# Patient Record
Sex: Male | Born: 1960
Health system: Southern US, Community
[De-identification: ages and names within clinical notes are randomized; demographics above are authoritative.]

## PROBLEM LIST (undated history)

## (undated) DIAGNOSIS — I10 Essential (primary) hypertension: Secondary | ICD-10-CM

## (undated) DIAGNOSIS — K219 Gastro-esophageal reflux disease without esophagitis: Secondary | ICD-10-CM

## (undated) DIAGNOSIS — R0600 Dyspnea, unspecified: Secondary | ICD-10-CM

## (undated) DIAGNOSIS — E7800 Pure hypercholesterolemia, unspecified: Secondary | ICD-10-CM

## (undated) DIAGNOSIS — I214 Non-ST elevation (NSTEMI) myocardial infarction: Secondary | ICD-10-CM

## (undated) DIAGNOSIS — R0902 Hypoxemia: Secondary | ICD-10-CM

## (undated) DIAGNOSIS — I2510 Atherosclerotic heart disease of native coronary artery without angina pectoris: Secondary | ICD-10-CM

## (undated) DIAGNOSIS — I209 Angina pectoris, unspecified: Secondary | ICD-10-CM

## (undated) DIAGNOSIS — K5790 Diverticulosis of intestine, part unspecified, without perforation or abscess without bleeding: Secondary | ICD-10-CM

## (undated) DIAGNOSIS — M1990 Unspecified osteoarthritis, unspecified site: Secondary | ICD-10-CM

## (undated) HISTORY — DX: Diverticulosis of intestine, part unspecified, without perforation or abscess without bleeding: K57.90

## (undated) HISTORY — DX: Atherosclerotic heart disease of native coronary artery without angina pectoris: I25.10

## (undated) HISTORY — DX: Hypoxemia: R09.02

---

## 2002-08-20 ENCOUNTER — Emergency Department (HOSPITAL_COMMUNITY): Admission: EM | Admit: 2002-08-20 | Discharge: 2002-08-20 | Payer: Self-pay | Admitting: *Deleted

## 2002-08-20 ENCOUNTER — Encounter: Payer: Self-pay | Admitting: *Deleted

## 2002-09-09 ENCOUNTER — Encounter: Payer: Self-pay | Admitting: Emergency Medicine

## 2002-09-09 ENCOUNTER — Emergency Department (HOSPITAL_COMMUNITY): Admission: EM | Admit: 2002-09-09 | Discharge: 2002-09-09 | Payer: Self-pay | Admitting: Emergency Medicine

## 2003-01-18 ENCOUNTER — Emergency Department (HOSPITAL_COMMUNITY): Admission: EM | Admit: 2003-01-18 | Discharge: 2003-01-18 | Payer: Self-pay | Admitting: *Deleted

## 2003-03-17 ENCOUNTER — Encounter (HOSPITAL_COMMUNITY): Admission: RE | Admit: 2003-03-17 | Discharge: 2003-04-16 | Payer: Self-pay | Admitting: Cardiology

## 2003-03-17 ENCOUNTER — Encounter: Payer: Self-pay | Admitting: Cardiology

## 2003-04-04 ENCOUNTER — Ambulatory Visit (HOSPITAL_COMMUNITY): Admission: RE | Admit: 2003-04-04 | Discharge: 2003-04-04 | Payer: Self-pay | Admitting: *Deleted

## 2003-04-09 ENCOUNTER — Ambulatory Visit (HOSPITAL_COMMUNITY): Admission: RE | Admit: 2003-04-09 | Discharge: 2003-04-09 | Payer: Self-pay | Admitting: *Deleted

## 2004-08-04 ENCOUNTER — Emergency Department (HOSPITAL_COMMUNITY): Admission: EM | Admit: 2004-08-04 | Discharge: 2004-08-04 | Payer: Self-pay | Admitting: Emergency Medicine

## 2006-03-01 ENCOUNTER — Emergency Department: Payer: Self-pay | Admitting: Emergency Medicine

## 2009-06-08 ENCOUNTER — Ambulatory Visit: Payer: Self-pay | Admitting: Family Medicine

## 2009-06-08 DIAGNOSIS — S335XXA Sprain of ligaments of lumbar spine, initial encounter: Secondary | ICD-10-CM | POA: Insufficient documentation

## 2009-08-13 ENCOUNTER — Encounter: Payer: Self-pay | Admitting: Family Medicine

## 2010-11-02 ENCOUNTER — Emergency Department (HOSPITAL_COMMUNITY)
Admission: EM | Admit: 2010-11-02 | Discharge: 2010-11-02 | Payer: Self-pay | Source: Home / Self Care | Admitting: Emergency Medicine

## 2011-02-25 NOTE — Op Note (Signed)
Anthony Hensley, Anthony Hensley                            ACCOUNT NO.:  [id number]   MEDICAL RECORD NO.:  [id number]                   PATIENT TYPE:  OIB   LOCATION:  2855                                 FACILITY:  [facility]   PHYSICIAN:  Vida Roller, M.D.                DATE OF BIRTH:  09/14/1961   DATE OF PROCEDURE:  04/09/2003  DATE OF DISCHARGE:                                 OPERATIVE REPORT   PRIMARY CARE PHYSICIAN:  Anthony Hensley, M.D.   PROCEDURES PERFORMED:  1. Left heart catheterization.  2. Selective coronary angiography.  3. Left ventriculography all via the right radial approach.   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  He  presented to see me with atypical chest discomfort, had a stress test which  was electrically positive and was referred for elective heart  catheterization.  I discussed with him the approach via the groin and the  right wrist and he chose the radial approach.   DESCRIPTION OF PROCEDURE:  After obtaining informed consent, the patient was  brought to the cardiac catheterization laboratory in the fasting state.  There, he was prepped and draped in the usual sterile manner and conscious  sedation was obtained using graduated doses of Versed and Fentanyl.  The  right wrist was anesthetized using 1% lidocaine without epinephrine and the  right radial artery was cannulated using the modified Seldinger technique  with a 6 French 10 cm catheter.  The left heart catheterization was  performed using a 6 French Judkins left #4 which subselectively seated the  circumflex coronary artery.  A 6 French Amplatz left #1 which successfully  seated the left main coronary artery and a 6 French Judkins right #4 which  successfully seated the right coronary artery as well as a 6 French angled  pigtail catheter used for the ventriculography.  Ventriculography was  performed in the 30 degree RAO view with a power injector which injected at  a rate of 10 mL a second for a total of 39 mL at half second rate of rise,  600 PSI.  At the conclusion of the procedure, the catheters were removed.  The sheath was removed.  A Radstat device was placed.  Adequate hemostasis  was obtained.  There was pulse oximetry determinations in both thumb and  third finger which were 93% indicating adequate oxygenation throughout the  palmar arch.   RESULTS:  The left ventricular pressure was 116/5 with an end diastolic  pressure of 7 mmHg.   The aortic pressure was 107/75 with a mean arterial pressure of 95 mmHg.   Selective coronary angiography:  The left main coronary artery is a short  artery which is angiographically normal.   The left anterior descending coronary artery is a moderate caliber vessel  which has a 25% stenosis in its mid portion between the first and second  diagonal.  The first and second diagonal are small arteries.  The remainder  of the LAD is angiographically normal.   The circumflex coronary artery is a moderate caliber vessel which is  angiographically normal and gives off one large obtuse marginal and a small  obtuse marginal.   The right coronary artery is a moderate caliber dominant vessel giving off a  moderate caliber left anterior descending coronary artery.  It is  angiographically normal.   Left ventriculogram reveals an ejection fraction estimated at 60% with no  wall motion abnormalities and no mitral regurgitation.   ASSESSMENT:  This is a man with nonobstructive coronary disease in his left  anterior descending coronary artery.   RECOMMENDATIONS:  We recommend aggressive medical therapy and smoking  cessation.                                               Vida Roller, M.D.    JH/MEDQ  D:  04/09/2003  T:  04/09/2003  Job:  [id number]   cc:   Anthony Hensley, M.D.  [address]  [place], [place] [address]  Fax:  [phone]

## 2011-02-25 NOTE — Procedures (Signed)
   NAME:  Birdie, Beveridge NO.:  [id number]   MEDICAL RECORD NO.:  [id number]                   PATIENT TYPE:  PREC   LOCATION:                                       FACILITY:   PHYSICIAN:  Vida Roller, M.D.                DATE OF BIRTH:  Jun 15, 1961   DATE OF PROCEDURE:  03/17/2003  DATE OF DISCHARGE:                                    STRESS TEST   INDICATION:  Mr. Malter is a 50 year old male with no known coronary artery  disease, who presented with chest discomfort with elements both typical and  atypical for coronary artery disease.  His cardiac risk factors include  hypertension, hyperlipidemia, tobacco abuse.   BASELINE DATA:  EKG shows sinus rhythm at 67 beats per minute with  nonspecific ST abnormalities.  Blood pressure is 110/72.  The patient  exercised for a total of 10 minutes and 42 seconds to 12.9 METS.  The  patient complained of left-sided chest pressure with radiation to the left  arm which was 8/10 at its worst and resolved to complete recovery.  EKG  showed no ischemic changes and no arrhythmias.  Maximum heart rate achieved  was 155 beats per minute which is 86% of predicted maximum, and maximal  blood pressure was 172/90.   Final images and results are pending MD review.     Amy Mercy Riding, P.A. [facility]                     Vida Roller, M.D.    AB/MEDQ  D:  03/17/2003  T:  03/17/2003  Job:  [id number]

## 2015-02-18 ENCOUNTER — Encounter (HOSPITAL_COMMUNITY): Payer: Self-pay | Admitting: *Deleted

## 2015-02-18 ENCOUNTER — Emergency Department (HOSPITAL_COMMUNITY)
Admission: EM | Admit: 2015-02-18 | Discharge: 2015-02-18 | Disposition: A | Discharge status: Home / Self Care | Payer: No Typology Code available for payment source | Attending: Emergency Medicine | Admitting: Emergency Medicine

## 2015-02-18 ENCOUNTER — Emergency Department (HOSPITAL_COMMUNITY): Payer: No Typology Code available for payment source

## 2015-02-18 DIAGNOSIS — I10 Essential (primary) hypertension: Secondary | ICD-10-CM | POA: Insufficient documentation

## 2015-02-18 DIAGNOSIS — R42 Dizziness and giddiness: Secondary | ICD-10-CM | POA: Diagnosis present

## 2015-02-18 DIAGNOSIS — Z720 Tobacco use: Secondary | ICD-10-CM | POA: Diagnosis not present

## 2015-02-18 HISTORY — DX: Essential (primary) hypertension: I10

## 2015-02-18 LAB — COMPREHENSIVE METABOLIC PANEL
ALK PHOS: 87 U/L (ref 38–126)
ALT: 38 U/L (ref 17–63)
AST: 26 U/L (ref 15–41)
Albumin: 3.9 g/dL (ref 3.5–5.0)
Anion gap: 11 (ref 5–15)
BILIRUBIN TOTAL: 0.5 mg/dL (ref 0.3–1.2)
BUN: 22 mg/dL — ABNORMAL HIGH (ref 6–20)
CHLORIDE: 102 mmol/L (ref 101–111)
CO2: 25 mmol/L (ref 22–32)
Calcium: 9.6 mg/dL (ref 8.9–10.3)
Creatinine, Ser: 0.97 mg/dL (ref 0.61–1.24)
GFR calc Af Amer: >60 mL/min (ref >60)
GFR calc non Af Amer: >60 mL/min (ref >60)
Glucose, Bld: 103 mg/dL — ABNORMAL HIGH (ref 70–99)
POTASSIUM: 3.9 mmol/L (ref 3.5–5.1)
Sodium: 138 mmol/L (ref 135–145)
Total Protein: 7.7 g/dL (ref 6.5–8.1)

## 2015-02-18 LAB — CBC WITH DIFFERENTIAL/PLATELET
Basophils Absolute: 0.1 10*3/uL (ref 0.0–0.1)
Basophils Relative: 1 % (ref 0–1)
Eosinophils Absolute: 0.1 10*3/uL (ref 0.0–0.7)
Eosinophils Relative: 2 % (ref 0–5)
HEMATOCRIT: 48.1 % (ref 39.0–52.0)
HEMOGLOBIN: 16.4 g/dL (ref 13.0–17.0)
LYMPHS PCT: 30 % (ref 12–46)
Lymphs Abs: 1.5 10*3/uL (ref 0.7–4.0)
MCH: 30.8 pg (ref 26.0–34.0)
MCHC: 34.1 g/dL (ref 30.0–36.0)
MCV: 90.4 fL (ref 78.0–100.0)
MONO ABS: 0.4 10*3/uL (ref 0.1–1.0)
MONOS PCT: 8 % (ref 3–12)
NEUTROS ABS: 2.9 10*3/uL (ref 1.7–7.7)
Neutrophils Relative %: 59 % (ref 43–77)
Platelets: 190 10*3/uL (ref 150–400)
RBC: 5.32 MIL/uL (ref 4.22–5.81)
RDW: 12.7 % (ref 11.5–15.5)
WBC: 4.9 10*3/uL (ref 4.0–10.5)

## 2015-02-18 NOTE — ED Provider Notes (Signed)
CSN: [id number]     Arrival date & time 02/18/15  1758 History   First MD Initiated Contact with Patient 02/18/15 2103     Chief Complaint  Patient presents with  . Dizziness     (Consider location/radiation/quality/duration/timing/severity/associated sxs/prior Treatment) HPI Comments: 54 year old male, history of high blood pressure and tobacco use who presents with approximately one month of intermittent vertigo, he states that sometimes this comes on when he stands up, sometimes when he is sitting, it lasted between 10 and 20 minutes and then self resolves. He does get improvement with meclizine, he was recently diagnosed with an ear infection and treated with antibiotics, he has occasional ringing in his right ear. The symptoms are intermittent, he does not have them at this time.  Patient is a 54 y.o. male presenting with dizziness. The history is provided by the patient.  Dizziness   Past Medical History  Diagnosis Date  . Hypertension    History reviewed. No pertinent past surgical history. No family history on file. History  Substance Use Topics  . Smoking status: Current Every Day Smoker  . Smokeless tobacco: Not on file  . Alcohol Use: No    Review of Systems  Neurological: Positive for dizziness.  All other systems reviewed and are negative.     Allergies  Review of patient's allergies indicates no known allergies.  Home Medications   Prior to Admission medications   Not on File   BP 162/78 mmHg  Pulse 64  Temp(Src) 97.4 F (36.3 C) (Oral)  Resp 23  Ht 5\' 6"  (1.676 m)  Wt 154 lb (69.854 kg)  BMI 24.87 kg/m2  SpO2 95% Physical Exam  Constitutional: He appears well-developed and well-nourished. No distress.  HENT:  Head: Normocephalic and atraumatic.  Mouth/Throat: Oropharynx is clear and moist. No oropharyngeal exudate.  Normal appearing tympanic membranes bilaterally, no erythema, no effusion, no bulging, landmarks visualized easily. Oropharynx is  clear and moist, nasal passages are clear, no sinus tenderness, no trismus  Eyes: Conjunctivae and EOM are normal. Pupils are equal, round, and reactive to light. Right eye exhibits no discharge. Left eye exhibits no discharge. No scleral icterus.  Neck: Normal range of motion. Neck supple. No JVD present. No thyromegaly present.  No lymphadenopathy or torticollis  Cardiovascular: Normal rate, regular rhythm, normal heart sounds and intact distal pulses.  Exam reveals no gallop and no friction rub.   No murmur heard. Pulmonary/Chest: Effort normal and breath sounds normal. No respiratory distress. He has no wheezes. He has no rales.  Abdominal: Soft. Bowel sounds are normal. He exhibits no distension and no mass. There is no tenderness.  Musculoskeletal: Normal range of motion. He exhibits no edema or tenderness.  Lymphadenopathy:    He has no cervical adenopathy.  Neurological: He is alert. Coordination normal.  No nystagmus, strength is 5 out of 5 in all 4 extremities, speech is normal, sensation is normal in all 4 extremities, cranial nerves III through XII are intact, memory is intact, no inducible nystagmus  Skin: Skin is warm and dry. No rash noted. No erythema.  Psychiatric: He has a normal mood and affect. His behavior is normal.  Nursing note and vitals reviewed.   ED Course  Procedures (including critical care time) Labs Review Labs Reviewed  COMPREHENSIVE METABOLIC PANEL - Abnormal; Notable for the following:    Glucose, Bld 103 (*)    BUN 22 (*)    All other components within normal limits  CBC WITH DIFFERENTIAL/PLATELET  Imaging Review Dg Chest 2 View  02/18/2015   CLINICAL DATA:  Dizziness for 1 month. Sweating after dizzy episodes today. History of hypertension.  EXAM: CHEST  2 VIEW  COMPARISON:  Report from 04/04/2003  FINDINGS: Airway thickening is present, suggesting bronchitis or reactive airways disease. Cardiac and mediastinal margins appear normal. No pleural  effusion or airspace opacity noted.  Deformity of the left inferior scapular neck, query remote fracture versus less likely exostosis.  IMPRESSION: 1. Airway thickening is present, suggesting bronchitis or reactive airways disease. 2. Deformity of the left scapular neck probably from an old fracture, less likely due to an exostosis.   Electronically Signed   By: Van Clines M.D.   On: 02/18/2015 20:02     EKG Interpretation   Date/Time:  Wednesday Feb 18 2015 18:29:08 EDT Ventricular Rate:  84 PR Interval:  154 QRS Duration: 104 QT Interval:  380 QTC Calculation: 449 R Axis:   80 Text Interpretation:  Normal sinus rhythm Incomplete right bundle branch  block Borderline ECG No old tracing to compare Confirmed by Mahiya Kercheval  MD,  [place] ([id number]) on 02/18/2015 9:22:28 PM      MDM   Final diagnoses:  Vertigo    The patient is likely having some kind of labyrinthitis, his vital signs show mild hypertension, he is likely having this as a effect of the recent ear infection, at this time he appears benign, he is not having continual symptoms, this does not sound like a central source, he is having improvement with meclizine and is not having any symptoms at this time, he appears stable for discharge. He will be kept out of work this week, follow-up with his doctor on Monday, advanced imaging is not indicated at this time.    Noemi Chapel, MD 02/18/15 2154

## 2015-02-18 NOTE — Discharge Instructions (Signed)
Take the meclizine as needed for dizziness, see your doctor on Monday.  Please call your doctor for a followup appointment within 24-48 hours. When you talk to your doctor please let them know that you were seen in the emergency department and have them acquire all of your records so that they can discuss the findings with you and formulate a treatment plan to fully care for your new and ongoing problems.

## 2015-02-18 NOTE — ED Notes (Signed)
The pot is c/o dizziness since Friday.  He saw his doctor and was diagnosed with inner ear rt ear.,  noi better.

## 2015-06-01 ENCOUNTER — Encounter (HOSPITAL_COMMUNITY): Payer: Self-pay | Admitting: Vascular Surgery

## 2015-06-01 ENCOUNTER — Emergency Department (HOSPITAL_COMMUNITY)
Admission: EM | Admit: 2015-06-01 | Discharge: 2015-06-02 | Disposition: A | Discharge status: Home / Self Care | Payer: No Typology Code available for payment source | Attending: Emergency Medicine | Admitting: Emergency Medicine

## 2015-06-01 DIAGNOSIS — I10 Essential (primary) hypertension: Secondary | ICD-10-CM | POA: Insufficient documentation

## 2015-06-01 DIAGNOSIS — Z720 Tobacco use: Secondary | ICD-10-CM | POA: Diagnosis not present

## 2015-06-01 DIAGNOSIS — R42 Dizziness and giddiness: Secondary | ICD-10-CM | POA: Diagnosis present

## 2015-06-01 LAB — BASIC METABOLIC PANEL
Anion gap: 7 (ref 5–15)
BUN: 14 mg/dL (ref 6–20)
CO2: 28 mmol/L (ref 22–32)
Calcium: 9.6 mg/dL (ref 8.9–10.3)
Chloride: 104 mmol/L (ref 101–111)
Creatinine, Ser: 1.04 mg/dL (ref 0.61–1.24)
GFR calc Af Amer: >60 mL/min (ref >60)
Glucose, Bld: 86 mg/dL (ref 65–99)
Potassium: 4.5 mmol/L (ref 3.5–5.1)
SODIUM: 139 mmol/L (ref 135–145)

## 2015-06-01 LAB — I-STAT TROPONIN, ED: Troponin i, poc: 0 ng/mL (ref 0.00–0.08)

## 2015-06-01 LAB — URINALYSIS, ROUTINE W REFLEX MICROSCOPIC
Bilirubin Urine: NEGATIVE
Glucose, UA: NEGATIVE mg/dL
HGB URINE DIPSTICK: NEGATIVE
KETONES UR: NEGATIVE mg/dL
Leukocytes, UA: NEGATIVE
NITRITE: NEGATIVE
Protein, ur: NEGATIVE mg/dL
Specific Gravity, Urine: 1.016 (ref 1.005–1.030)
UROBILINOGEN UA: 0.2 mg/dL (ref 0.0–1.0)
pH: 5.5 (ref 5.0–8.0)

## 2015-06-01 LAB — CBC
HCT: 46.6 % (ref 39.0–52.0)
Hemoglobin: 15.9 g/dL (ref 13.0–17.0)
MCH: 31.2 pg (ref 26.0–34.0)
MCHC: 34.1 g/dL (ref 30.0–36.0)
MCV: 91.6 fL (ref 78.0–100.0)
PLATELETS: 193 10*3/uL (ref 150–400)
RBC: 5.09 MIL/uL (ref 4.22–5.81)
RDW: 12.6 % (ref 11.5–15.5)
WBC: 6.3 10*3/uL (ref 4.0–10.5)

## 2015-06-01 MED ORDER — MECLIZINE HCL 25 MG PO TABS
25.0000 mg | ORAL_TABLET | Freq: Once | ORAL | Status: AC
  Administered 2015-06-01: 25 mg via ORAL
  Filled 2015-06-01: qty 1

## 2015-06-01 NOTE — ED Notes (Signed)
Patient reports experiencing intermittent dizziness since Friday. Denies falls. Denies nausea/vomiting.

## 2015-06-01 NOTE — ED Provider Notes (Signed)
CSN: [id number]     Arrival date & time 06/01/15  1933 History   First MD Initiated Contact with Patient 06/01/15 2238     Chief Complaint  Patient presents with  . Dizziness     (Consider location/radiation/quality/duration/timing/severity/associated sxs/prior Treatment) HPI Comments: Patient presents today with a chief complaint of dizziness.  He states that the dizziness has been intermittent over the past 4 days.  He states that the dizziness occurs when changing position and lasts for a few minutes and then resolves.  He denies any dizziness when he is lying still.  He states that the dizziness is similar to dizziness that he has had in the past.  He states that he has taken Meclizine, which helps some.  He denies syncope, chest pain, SOB, vision changes, nausea, vomiting, headache, focal weakness, numbness, tingling, fever, or chills.  Patient is a 54 y.o. male presenting with dizziness. The history is provided by the patient.  Dizziness   Past Medical History  Diagnosis Date  . Hypertension    History reviewed. No pertinent past surgical history. No family history on file. Social History  Substance Use Topics  . Smoking status: Current Every Day Smoker -- 1.50 packs/day    Types: Cigarettes  . Smokeless tobacco: Never Used  . Alcohol Use: No    Review of Systems  Neurological: Positive for dizziness.  All other systems reviewed and are negative.     Allergies  Review of patient's allergies indicates no known allergies.  Home Medications   Prior to Admission medications   Not on File   BP 163/99 mmHg  Pulse 65  Temp(Src) 97.5 F (36.4 C) (Oral)  Resp 20  SpO2 98% Physical Exam  Constitutional: He appears well-developed and well-nourished.  HENT:  Head: Normocephalic and atraumatic.  Mouth/Throat: Oropharynx is clear and moist.  Eyes: Pupils are equal, round, and reactive to light. Right eye exhibits nystagmus. Left eye exhibits nystagmus.  Bilateral  horizontal nystagmus  Neck: Normal range of motion. Neck supple.  Cardiovascular: Normal rate, regular rhythm and normal heart sounds.   Pulmonary/Chest: Effort normal and breath sounds normal.  Musculoskeletal: Normal range of motion.  Neurological: He is alert. He has normal strength. No cranial nerve deficit or sensory deficit. Gait normal.  Normal gait, no ataxia  Skin: Skin is warm and dry.  Psychiatric: He has a normal mood and affect.  Nursing note and vitals reviewed.   ED Course  Procedures (including critical care time) Labs Review Labs Reviewed  BASIC METABOLIC PANEL  CBC  URINALYSIS, ROUTINE W REFLEX MICROSCOPIC (NOT AT [facility])  I-STAT TROPOININ, ED    Imaging Review No results found. I have personally reviewed and evaluated these images and lab results as part of my medical decision-making.   EKG Interpretation None      MDM   Final diagnoses:  None   Patient presents today with vertigo.  He has a history of Vertigo in the past.  Symptoms improved with Meclizine.  Vertigo is intermittent and also lasts a few minutes with positional changes.  Normal neurological exam.  Normal gait, no ataxia.  Feel that the signs and symptoms are consistent with peripheral vertigo.  Patient stable for discharge.  Return precautions given.      Hyman Bible, PA-C 06/03/15 2103  Fredia Sorrow, MD 06/05/15 [phone]

## 2015-06-02 MED ORDER — MECLIZINE HCL 50 MG PO TABS: 50.0000 mg | ORAL_TABLET | Freq: Three times a day (TID) | ORAL | Status: DC | PRN

## 2015-06-02 NOTE — ED Notes (Signed)
Discharge instructions/prescription discussed with patient/spouse. Understanding verbalized. Denies dizziness or pain at this time. Patient transported via wheelchair at time of discharge. No distress noted at discharge.

## 2015-07-11 ENCOUNTER — Encounter (HOSPITAL_COMMUNITY): Payer: Self-pay | Admitting: *Deleted

## 2015-07-11 ENCOUNTER — Inpatient Hospital Stay (HOSPITAL_COMMUNITY)
Admission: EM | Admit: 2015-07-11 | Discharge: 2015-07-14 | DRG: 247 | Disposition: A | Discharge status: Home / Self Care | Payer: PRIVATE HEALTH INSURANCE | Attending: Cardiovascular Disease | Admitting: Cardiovascular Disease

## 2015-07-11 ENCOUNTER — Emergency Department (HOSPITAL_COMMUNITY): Payer: PRIVATE HEALTH INSURANCE

## 2015-07-11 DIAGNOSIS — I10 Essential (primary) hypertension: Secondary | ICD-10-CM | POA: Diagnosis present

## 2015-07-11 DIAGNOSIS — I214 Non-ST elevation (NSTEMI) myocardial infarction: Secondary | ICD-10-CM | POA: Diagnosis not present

## 2015-07-11 DIAGNOSIS — R0789 Other chest pain: Secondary | ICD-10-CM

## 2015-07-11 DIAGNOSIS — R079 Chest pain, unspecified: Secondary | ICD-10-CM | POA: Diagnosis present

## 2015-07-11 DIAGNOSIS — I252 Old myocardial infarction: Secondary | ICD-10-CM | POA: Insufficient documentation

## 2015-07-11 DIAGNOSIS — Z6825 Body mass index (BMI) 25.0-25.9, adult: Secondary | ICD-10-CM

## 2015-07-11 DIAGNOSIS — Z9114 Patient's other noncompliance with medication regimen: Secondary | ICD-10-CM

## 2015-07-11 DIAGNOSIS — Z955 Presence of coronary angioplasty implant and graft: Secondary | ICD-10-CM

## 2015-07-11 DIAGNOSIS — E785 Hyperlipidemia, unspecified: Secondary | ICD-10-CM | POA: Diagnosis present

## 2015-07-11 DIAGNOSIS — F17210 Nicotine dependence, cigarettes, uncomplicated: Secondary | ICD-10-CM | POA: Diagnosis present

## 2015-07-11 DIAGNOSIS — E7800 Pure hypercholesterolemia, unspecified: Secondary | ICD-10-CM

## 2015-07-11 DIAGNOSIS — Z8249 Family history of ischemic heart disease and other diseases of the circulatory system: Secondary | ICD-10-CM

## 2015-07-11 DIAGNOSIS — I249 Acute ischemic heart disease, unspecified: Secondary | ICD-10-CM

## 2015-07-11 DIAGNOSIS — Z79899 Other long term (current) drug therapy: Secondary | ICD-10-CM

## 2015-07-11 DIAGNOSIS — I25110 Atherosclerotic heart disease of native coronary artery with unstable angina pectoris: Secondary | ICD-10-CM | POA: Diagnosis present

## 2015-07-11 DIAGNOSIS — I4510 Unspecified right bundle-branch block: Secondary | ICD-10-CM | POA: Diagnosis present

## 2015-07-11 HISTORY — DX: Pure hypercholesterolemia, unspecified: E78.00

## 2015-07-11 LAB — COMPREHENSIVE METABOLIC PANEL
ALT: 27 U/L (ref 17–63)
AST: 21 U/L (ref 15–41)
Albumin: 4 g/dL (ref 3.5–5.0)
Alkaline Phosphatase: 74 U/L (ref 38–126)
Anion gap: 7 (ref 5–15)
BUN: 19 mg/dL (ref 6–20)
CO2: 24 mmol/L (ref 22–32)
CREATININE: 0.86 mg/dL (ref 0.61–1.24)
Calcium: 8.7 mg/dL — ABNORMAL LOW (ref 8.9–10.3)
Chloride: 105 mmol/L (ref 101–111)
GFR calc Af Amer: >60 mL/min (ref >60)
Glucose, Bld: 93 mg/dL (ref 65–99)
POTASSIUM: 4 mmol/L (ref 3.5–5.1)
Sodium: 136 mmol/L (ref 135–145)
Total Bilirubin: 0.3 mg/dL (ref 0.3–1.2)
Total Protein: 7.2 g/dL (ref 6.5–8.1)

## 2015-07-11 LAB — CBC WITH DIFFERENTIAL/PLATELET
BASOS ABS: 0 10*3/uL (ref 0.0–0.1)
Basophils Relative: 1 %
Eosinophils Absolute: 0.1 10*3/uL (ref 0.0–0.7)
Eosinophils Relative: 2 %
HEMATOCRIT: 46.1 % (ref 39.0–52.0)
Hemoglobin: 15.7 g/dL (ref 13.0–17.0)
LYMPHS ABS: 1.7 10*3/uL (ref 0.7–4.0)
LYMPHS PCT: 35 %
MCH: 31.2 pg (ref 26.0–34.0)
MCHC: 34.1 g/dL (ref 30.0–36.0)
MCV: 91.5 fL (ref 78.0–100.0)
MONO ABS: 0.3 10*3/uL (ref 0.1–1.0)
MONOS PCT: 6 %
Neutro Abs: 2.7 10*3/uL (ref 1.7–7.7)
Neutrophils Relative %: 56 %
Platelets: 178 10*3/uL (ref 150–400)
RBC: 5.04 MIL/uL (ref 4.22–5.81)
RDW: 12.6 % (ref 11.5–15.5)
WBC: 4.8 10*3/uL (ref 4.0–10.5)

## 2015-07-11 LAB — TROPONIN I: Troponin I: 0.09 ng/mL — ABNORMAL HIGH (ref <0.031)

## 2015-07-11 LAB — I-STAT TROPONIN, ED: TROPONIN I, POC: 0.08 ng/mL (ref 0.00–0.08)

## 2015-07-11 MED ORDER — NITROGLYCERIN 0.4 MG SL SUBL: 0.4000 mg | SUBLINGUAL_TABLET | SUBLINGUAL | Status: DC | PRN

## 2015-07-11 MED ORDER — NICOTINE 21 MG/24HR TD PT24
21.0000 mg | MEDICATED_PATCH | Freq: Every day | TRANSDERMAL | Status: DC
  Administered 2015-07-11 – 2015-07-14 (×4): 21 mg via TRANSDERMAL
  Filled 2015-07-11 (×4): qty 1

## 2015-07-11 MED ORDER — ACETAMINOPHEN 325 MG PO TABS
650.0000 mg | ORAL_TABLET | ORAL | Status: DC | PRN
  Administered 2015-07-11: 650 mg via ORAL
  Filled 2015-07-11: qty 2

## 2015-07-11 MED ORDER — ENALAPRIL MALEATE 10 MG PO TABS
10.0000 mg | ORAL_TABLET | Freq: Every day | ORAL | Status: DC
Start: 2015-07-11 — End: 2015-07-14
  Administered 2015-07-11 – 2015-07-14 (×4): 10 mg via ORAL
  Filled 2015-07-11 (×2): qty 2
  Filled 2015-07-11: qty 1
  Filled 2015-07-11: qty 2

## 2015-07-11 MED ORDER — NITROGLYCERIN 2 % TD OINT
1.0000 [in_us] | TOPICAL_OINTMENT | Freq: Once | TRANSDERMAL | Status: AC
  Administered 2015-07-11: 1 [in_us] via TOPICAL
  Filled 2015-07-11: qty 1

## 2015-07-11 MED ORDER — ENOXAPARIN SODIUM 40 MG/0.4ML ~~LOC~~ SOLN
40.0000 mg | SUBCUTANEOUS | Status: DC
  Administered 2015-07-11: 40 mg via SUBCUTANEOUS
  Filled 2015-07-11: qty 0.4

## 2015-07-11 MED ORDER — ASPIRIN 325 MG PO TABS
325.0000 mg | ORAL_TABLET | Freq: Once | ORAL | Status: AC
  Administered 2015-07-11: 325 mg via ORAL
  Filled 2015-07-11: qty 1

## 2015-07-11 MED ORDER — MORPHINE SULFATE (PF) 2 MG/ML IV SOLN
2.0000 mg | Freq: Once | INTRAVENOUS | Status: AC
Start: 2015-07-11 — End: 2015-07-11
  Administered 2015-07-11: 2 mg via INTRAVENOUS
  Filled 2015-07-11: qty 1

## 2015-07-11 MED ORDER — ASPIRIN 81 MG PO CHEW: 324.0000 mg | CHEWABLE_TABLET | ORAL | Status: DC

## 2015-07-11 MED ORDER — PANTOPRAZOLE SODIUM 40 MG PO TBEC
40.0000 mg | DELAYED_RELEASE_TABLET | Freq: Every day | ORAL | Status: DC
  Administered 2015-07-11 – 2015-07-14 (×4): 40 mg via ORAL
  Filled 2015-07-11 (×4): qty 1

## 2015-07-11 MED ORDER — ASPIRIN EC 81 MG PO TBEC
81.0000 mg | DELAYED_RELEASE_TABLET | Freq: Every day | ORAL | Status: DC
  Administered 2015-07-12 – 2015-07-14 (×3): 81 mg via ORAL
  Filled 2015-07-11 (×3): qty 1

## 2015-07-11 MED ORDER — ZOLPIDEM TARTRATE 5 MG PO TABS
5.0000 mg | ORAL_TABLET | Freq: Every evening | ORAL | Status: DC | PRN
  Administered 2015-07-11 – 2015-07-13 (×3): 5 mg via ORAL
  Filled 2015-07-11 (×3): qty 1

## 2015-07-11 MED ORDER — ONDANSETRON HCL 4 MG/2ML IJ SOLN: 4.0000 mg | Freq: Four times a day (QID) | INTRAMUSCULAR | Status: DC | PRN

## 2015-07-11 MED ORDER — ASPIRIN 300 MG RE SUPP
300.0000 mg | RECTAL | Status: DC
  Filled 2015-07-11: qty 1

## 2015-07-11 NOTE — ED Notes (Signed)
Pt states he started having chest pain 2 days ago. His pain gets worse when he does strenuous activity.  Pt states he has SOB at times. Denies n/v, dizziness or lightheadedness.

## 2015-07-11 NOTE — H&P (Signed)
History and Physical  KAVEN CUMBIE VHQ:[id number] DOB: 03-30-1961 DOA: 07/11/2015  Referring physician: Dr. Roderic Palau, ED physician PCP: No PCP Per Patient   Chief Complaint: Chest pain  HPI: Anthony Hensley is a 54 y.o. male  With hypertension who presents to the hospital with 1 week of intermittent sharp left-sided nonradiating chest pain. Usually chest pain comes on with exertion, although occasionally with rest. The patient relays that occasionally belching will relieve the chest pain. No other provoking or palliating factors. Denies shortness of breath, nausea, vomiting, diaphoresis with episodes of chest pain  Cardiac risk factors: #1 smoking #2 hypertension #3 family history of early MI in the patient's father: Patient's father deceased at age 41 from Massive MI   Review of Systems:   Pt denies any fevers, chills, nausea, vomiting, diarrhea, constipation, abdominal pain, shortness of breath, dyspnea on exertion, orthopnea, cough, wheezing, palpitations, headache, vision changes, lightheadedness, dizziness, melena, rectal bleeding.  Review of systems are otherwise negative  Past Medical History  Diagnosis Date  . Hypertension    History reviewed. No pertinent past surgical history. Social History:  reports that he has been smoking Cigarettes.  He has been smoking about 1.50 packs per day. He has never used smokeless tobacco. He reports that he does not drink alcohol or use illicit drugs. Patient lives at home & is able to participate in activities of daily living  No Known Allergies  Family History  Problem Relation Age of Onset  . Heart attack Father       Prior to Admission medications   Medication Sig Start Date End Date Taking? Authorizing Provider  Glycerin-Hypromellose-PEG 400 (VISINE TEARS OP) Apply 1 drop to eye daily.   Yes Historical Provider, MD  naproxen sodium (ANAPROX) 220 MG tablet Take 440 mg by mouth 2 (two) times daily with a meal.   Yes Historical  Provider, MD    Physical Exam: BP 172/106 mmHg  Pulse 78  Temp(Src) 97.9 F (36.6 C) (Oral)  Resp 20  Ht 5\' 6"  (1.676 m)  Wt 72.576 kg (160 lb)  BMI 25.84 kg/m2  SpO2 95%  General:  middle-aged Caucasian male. Awake and alert and oriented x3. No acute cardiopulmonary distress.  Eyes: Pupils equal, round, reactive to light. Extraocular muscles are intact. Sclerae anicteric and noninjected.  ENT: Moist mucosal membranes. No mucosal lesions. Teeth moderaterepair  Neck: Neck supple without lymphadenopathy. No carotid bruits. No masses palpated.  Cardiovascular: Regular rate with normal S1-S2 sounds. No murmurs, rubs, gallops auscultated. No JVD.  Respiratory: Good respiratory effort with no wheezes, rales, rhonchi. Lungs clear to auscultation bilaterally.  Abdomen: Soft, nontender, nondistended. Active bowel sounds. No masses or hepatosplenomegaly  Skin: Dry, warm to touch. 2+ dorsalis pedis and radial pulses. Musculoskeletal: No calf or leg pain. All major joints not erythematous nontender.  Psychiatric: Intact judgment and insight.  Neurologic: No focal neurological deficits. Cranial nerves II through XII are grossly intact.           Labs on Admission:  Basic Metabolic Panel:  Recent Labs Lab 07/11/15 1324  NA 136  K 4.0  CL 105  CO2 24  GLUCOSE 93  BUN 19  CREATININE 0.86  CALCIUM 8.7*   Liver Function Tests:  Recent Labs Lab 07/11/15 1324  AST 21  ALT 27  ALKPHOS 74  BILITOT 0.3  PROT 7.2  ALBUMIN 4.0   No results for input(s): LIPASE, AMYLASE in the last 168 hours. No results for input(s): AMMONIA in the  last 168 hours. CBC:  Recent Labs Lab 07/11/15 1324  WBC 4.8  NEUTROABS 2.7  HGB 15.7  HCT 46.1  MCV 91.5  PLT 178   Cardiac Enzymes: No results for input(s): CKTOTAL, CKMB, CKMBINDEX, TROPONINI in the last 168 hours.  BNP (last 3 results) No results for input(s): BNP in the last 8760 hours.  ProBNP (last 3 results) No results for  input(s): PROBNP in the last 8760 hours.  CBG: No results for input(s): GLUCAP in the last 168 hours.  Radiological Exams on Admission: Dg Chest Portable 1 View  07/11/2015   CLINICAL DATA:  Acute shortness of breath, chest pain and hypertension  EXAM: PORTABLE CHEST 1 VIEW  COMPARISON:  02/18/2015  FINDINGS: The heart size and mediastinal contours are within normal limits. Both lungs are clear. The visualized skeletal structures are unremarkable. Monitor leads overlie the chest.  IMPRESSION: No active disease.   Electronically Signed   By: Jerilynn Mages.  Shick M.D.   On: 07/11/2015 13:56    EKG: Independently reviewed.  sinus rhythm with right bundle branch block. Unchanged from previous. No ST elevation or depression. Negative for STEMI   Assessment/Plan Present on Admission:  . Chest pain . Hypertension  This patient was discussed with the ED physician, including pertinent vitals, physical exam findings, labs, and imaging.  We also discussed care given by the ED provider.  #1 chest pain  Observation with telemetry  By the patient's history, this is less likely to be ACS or unstable angina. The patient does, however, has several risk factors.  Rule out with troponins   Fasting lipid panel in the morning  Start the patient on PPI as this may be a cause of the patient's symptoms  #2 hypertension    start enalapril 10 mg for hypertension  DVT prophylaxis:Lovenox  Consultants:  none  Code Status: full code  Family Communication: Girlfriend in the room   Disposition Plan: Observation with telemetry    Truett Mainland, DO [facility] Pager [phone]

## 2015-07-11 NOTE — ED Provider Notes (Signed)
CSN: [id number]     Arrival date & time 07/11/15  1257 History   First MD Initiated Contact with Patient 07/11/15 1309     Chief Complaint  Patient presents with  . Chest Pain     (Consider location/radiation/quality/duration/timing/severity/associated sxs/prior Treatment) Patient is a 54 y.o. male presenting with chest pain. The history is provided by the patient (Patient states that he's been having chest pain with exertion for last 24 hours.).  Chest Pain Pain location:  Substernal area Pain quality: aching   Pain radiates to:  Does not radiate Pain radiates to the back: no   Pain severity:  Moderate Onset quality:  Sudden Timing:  Constant Progression:  Worsening Chronicity:  New Context: not breathing   Associated symptoms: no abdominal pain, no back pain, no cough, no fatigue and no headache     Past Medical History  Diagnosis Date  . Hypertension    History reviewed. No pertinent past surgical history. No family history on file. Social History  Substance Use Topics  . Smoking status: Current Every Day Smoker -- 1.50 packs/day    Types: Cigarettes  . Smokeless tobacco: Never Used  . Alcohol Use: No    Review of Systems  Constitutional: Negative for appetite change and fatigue.  HENT: Negative for congestion, ear discharge and sinus pressure.   Eyes: Negative for discharge.  Respiratory: Negative for cough.   Cardiovascular: Positive for chest pain.  Gastrointestinal: Negative for abdominal pain and diarrhea.  Genitourinary: Negative for frequency and hematuria.  Musculoskeletal: Negative for back pain.  Skin: Negative for rash.  Neurological: Negative for seizures and headaches.  Psychiatric/Behavioral: Negative for hallucinations.      Allergies  Review of patient's allergies indicates no known allergies.  Home Medications   Prior to Admission medications   Medication Sig Start Date End Date Taking? Authorizing Provider  Glycerin-Hypromellose-PEG  400 (VISINE TEARS OP) Apply 1 drop to eye daily.   Yes Historical Provider, MD  naproxen sodium (ANAPROX) 220 MG tablet Take 440 mg by mouth 2 (two) times daily with a meal.   Yes Historical Provider, MD  meclizine (ANTIVERT) 50 MG tablet Take 1 tablet (50 mg total) by mouth 3 (three) times daily as needed. Patient not taking: Reported on 07/11/2015 06/02/15   Heather Laisure, PA-C   BP 134/91 mmHg  Pulse 64  Temp(Src) 97.9 F (36.6 C) (Oral)  Resp 14  Ht 5\' 6"  (1.676 m)  Wt 160 lb (72.576 kg)  BMI 25.84 kg/m2  SpO2 97% Physical Exam  Constitutional: He is oriented to person, place, and time. He appears well-developed.  HENT:  Head: Normocephalic.  Eyes: Conjunctivae and EOM are normal. No scleral icterus.  Neck: Neck supple. No thyromegaly present.  Cardiovascular: Normal rate and regular rhythm.  Exam reveals no gallop and no friction rub.   No murmur heard. Pulmonary/Chest: No stridor. He has no wheezes. He has no rales. He exhibits no tenderness.  Abdominal: He exhibits no distension. There is no tenderness. There is no rebound.  Musculoskeletal: Normal range of motion. He exhibits no edema.  Lymphadenopathy:    He has no cervical adenopathy.  Neurological: He is oriented to person, place, and time. He exhibits normal muscle tone. Coordination normal.  Skin: No rash noted. No erythema.  Psychiatric: He has a normal mood and affect. His behavior is normal.    ED Course  Procedures (including critical care time) Labs Review Labs Reviewed  COMPREHENSIVE METABOLIC PANEL - Abnormal; Notable for the following:  Calcium 8.7 (*)    All other components within normal limits  CBC WITH DIFFERENTIAL/PLATELET  I-STAT TROPOININ, ED    Imaging Review Dg Chest Portable 1 View  07/11/2015   CLINICAL DATA:  Acute shortness of breath, chest pain and hypertension  EXAM: PORTABLE CHEST 1 VIEW  COMPARISON:  02/18/2015  FINDINGS: The heart size and mediastinal contours are within normal  limits. Both lungs are clear. The visualized skeletal structures are unremarkable. Monitor leads overlie the chest.  IMPRESSION: No active disease.   Electronically Signed   By: Jerilynn Mages.  Shick M.D.   On: 07/11/2015 13:56   I have personally reviewed and evaluated these images and lab results as part of my medical decision-making.   EKG Interpretation   Date/Time:  Saturday July 11 2015 13:13:38 EDT Ventricular Rate:  65 PR Interval:  139 QRS Duration: 113 QT Interval:  398 QTC Calculation: 414 R Axis:   80 Text Interpretation:  Sinus rhythm Incomplete right bundle branch block  Confirmed by Jaunice Mirza  MD, Broadus John [id number]) on 07/11/2015 1:21:06 PM Also  confirmed by Chelsea Pedretti  MD, Broadus John [phone])  on 07/11/2015 1:50:29 PM      MDM   Final diagnoses:  None   Admit to obs for chest pain     Milton Ferguson, MD 07/11/15 1524

## 2015-07-12 DIAGNOSIS — E785 Hyperlipidemia, unspecified: Secondary | ICD-10-CM | POA: Diagnosis not present

## 2015-07-12 DIAGNOSIS — I249 Acute ischemic heart disease, unspecified: Secondary | ICD-10-CM

## 2015-07-12 DIAGNOSIS — R079 Chest pain, unspecified: Secondary | ICD-10-CM | POA: Diagnosis not present

## 2015-07-12 DIAGNOSIS — I10 Essential (primary) hypertension: Secondary | ICD-10-CM | POA: Diagnosis not present

## 2015-07-12 DIAGNOSIS — E7800 Pure hypercholesterolemia, unspecified: Secondary | ICD-10-CM

## 2015-07-12 HISTORY — DX: Acute ischemic heart disease, unspecified: I24.9

## 2015-07-12 LAB — PROTIME-INR
INR: 0.95 (ref 0.00–1.49)
Prothrombin Time: 12.9 seconds (ref 11.6–15.2)

## 2015-07-12 LAB — TROPONIN I
Troponin I: 0.07 ng/mL — ABNORMAL HIGH (ref <0.031)
Troponin I: 0.05 ng/mL — ABNORMAL HIGH (ref <0.031)

## 2015-07-12 LAB — LIPID PANEL
CHOL/HDL RATIO: 6.1 ratio
Cholesterol: 209 mg/dL — ABNORMAL HIGH (ref 0–200)
HDL: 34 mg/dL — AB (ref >40)
LDL CALC: 150 mg/dL — AB (ref 0–99)
TRIGLYCERIDES: 126 mg/dL (ref <150)
VLDL: 25 mg/dL (ref 0–40)

## 2015-07-12 MED ORDER — ENOXAPARIN SODIUM 80 MG/0.8ML ~~LOC~~ SOLN
1.0000 mg/kg | Freq: Two times a day (BID) | SUBCUTANEOUS | Status: DC
  Administered 2015-07-12 (×2): 75 mg via SUBCUTANEOUS
  Filled 2015-07-12 (×2): qty 0.8

## 2015-07-12 MED ORDER — ATORVASTATIN CALCIUM 80 MG PO TABS
80.0000 mg | ORAL_TABLET | Freq: Every day | ORAL | Status: DC
  Administered 2015-07-12 – 2015-07-13 (×2): 80 mg via ORAL
  Filled 2015-07-12: qty 2
  Filled 2015-07-12: qty 1

## 2015-07-12 MED ORDER — METOPROLOL TARTRATE 12.5 MG HALF TABLET
12.5000 mg | ORAL_TABLET | Freq: Two times a day (BID) | ORAL | Status: DC
  Administered 2015-07-12 – 2015-07-14 (×5): 12.5 mg via ORAL
  Filled 2015-07-12 (×5): qty 1

## 2015-07-12 NOTE — Progress Notes (Signed)
PROGRESS NOTE  Anthony Hensley FBP:[id number] DOB: [birth year] DOA: 07/11/2015 PCP: No PCP Per Patient  Summary: 54 yom who presented with 1 week of intermittent sharp left side non radiating CP. Patient has several cardiac risk factors and was admitted for further workup and monitoring.  Assessment/Plan: 1. ACS. Troponin trending down. EKG nonacute. No current CP. Low risk nuclear stress 2004. RF include smoking, HTN, Fhx early MI in father age 52. 2. Hyperlipidemia. LDL 150.  3. HTN, stable    Appears stable, pain free.  Will continue telemetry, aspirin, follow up echocardiogram and add statin, therapeutic Lovenox and add low dose beta blocker if BP can tolerate   Discussed with Dr. Haroldine Laws, agrees with current management, recommends cardiology consult in AM and transfer 10/3 to [facility] for [place]. Remain at [facility] today. T  Code Status: Full DVT prophylaxis: Lovenox  Family Communication: Girlfriend at bedside. Care plan was discussed and no questions at this time  Disposition Plan: Home once improved  Murray Hodgkins, MD  [facility]  Pager [phone] If 7PM-7AM, please contact night-coverage at [email or web address], password [facility] 07/12/2015, 6:28 AM    Consultants:  Cardiology   Procedures:    Antibiotics:    HPI/Subjective: No SOB, n/v. No pain or tingling right now. "Tingling" in chest last night but not much pain. No chest pain today. Relates DOE and CP w/ exertion relieved with rest, intensified over the last week.  Objective: Filed Vitals:   07/11/15 1730 07/11/15 1749 07/11/15 2209 07/12/15 0520  BP: 130/94 146/95 121/84 112/72  Pulse: 75 66 85 74  Temp:  97.5 F (36.4 C) 98.4 F (36.9 C) 98.5 F (36.9 C)  TempSrc:  Oral Oral Oral  Resp: 14 14 16 16   Height:  5\' 8"  (1.727 m)    Weight:  74.163 kg (163 lb 8 oz)    SpO2: 96% 99% 96% 100%    Intake/Output Summary (Last 24 hours) at 07/12/15 6144 Last data filed at 07/11/15 1806  Gross per 24 hour  Intake       0 ml  Output    200 ml  Net   -200 ml     Filed Weights   07/11/15 1316 07/11/15 1749  Weight: 72.576 kg (160 lb) 74.163 kg (163 lb 8 oz)    Exam:    Afebrile, VSS, no hypoxia General:  Appears comfortable, calm. Lying in bed Eyes: PERRL, normal lids, irises, wears glasses  ENT: grossly normal hearing, lips, tongue Cardiovascular: Regular rate and rhythm, no murmur, rub or gallop. No lower extremity edema. Telemetry: Sinus rhythm, no arrhythmias  Respiratory: Clear to auscultation bilaterally, no wheezes, rales or rhonchi. Normal respiratory effort. Abdomen: soft, ntnd Musculoskeletal: grossly normal tone bilateral upper and lower extremities Psychiatric: grossly normal mood and affect, speech fluent and appropriate Neurologic: grossly non-focal.  New data reviewed:  EKG SR, incomplete RBBB, no acute changes, NSCSLT   Troponin trending down  Pertinent data since admission:  CXR NAD independently reviewed  EKG 10/1: SR, incomplete RBBB, NSCSLT 06/01/2015.  Pending data:   Scheduled Meds: . aspirin  324 mg Oral NOW   Or  . aspirin  300 mg Rectal NOW  . aspirin EC  81 mg Oral Daily  . enalapril  10 mg Oral Daily  . enoxaparin (LOVENOX) injection  40 mg Subcutaneous Q24H  . nicotine  21 mg Transdermal Daily  . pantoprazole  40 mg Oral Daily   Continuous Infusions:   Principal Problem:   ACS (acute coronary  syndrome) ([place]) Active Problems:   Chest pain   Essential hypertension   Hyperlipidemia   Time spent 35 minutes, >50% in counseling and coordination of care  By signing my name below, I, Arielle Khosrowpour, attest that this documentation has been prepared under the direction and in the presence of [place]. Sarajane Jews, MD. Electronically signed: Salvadore Oxford. 07/12/2015 10:16 AM  I personally performed the services described in this documentation. All medical record entries made by the scribe were at my direction. I have reviewed the chart and agree  that the record reflects my personal performance and is accurate and complete. Murray Hodgkins, MD

## 2015-07-12 NOTE — Progress Notes (Signed)
ANTICOAGULATION CONSULT NOTE - Initial Consult  Pharmacy Consult for Lovenox Indication: chest pain/ACS  No Known Allergies  Patient Measurements: Height: 5\' 8"  (172.7 cm) Weight: 163 lb 8 oz (74.163 kg) IBW/kg (Calculated) : 68.4 Heparin Dosing Weight:   Vital Signs: Temp: 98.5 F (36.9 C) (10/02 0520) Temp Source: Oral (10/02 0520) BP: 112/72 mmHg (10/02 0520) Pulse Rate: 74 (10/02 0520)  Labs:  Recent Labs  07/11/15 1324 07/11/15 1817 07/11/15 2358 07/12/15 0615  HGB 15.7  --   --   --   HCT 46.1  --   --   --   PLT 178  --   --   --   CREATININE 0.86  --   --   --   TROPONINI  --  0.09* 0.07* 0.05*    Estimated Creatinine Clearance: 98.3 mL/min (by C-G formula based on Cr of 0.86).   Medical History: Past Medical History  Diagnosis Date  . Hypertension     Medications:  Prescriptions prior to admission  Medication Sig Dispense Refill Last Dose  . Glycerin-Hypromellose-PEG 400 (VISINE TEARS OP) Apply 1 drop to eye daily.   Past Week at Unknown time  . naproxen sodium (ANAPROX) 220 MG tablet Take 440 mg by mouth 2 (two) times daily with a meal.   07/10/2015 at Unknown time    Assessment: Admitted yesterday with chest pain Lovenox 40 mg SQ given last evening  Labs reviewed PTA medications reviewed CrCl > 30 ml/min  Goal of Therapy:  Anti-Xa level 0.6-1 units/ml 4hrs after LMWH dose given if clinically necessary Monitor platelets by anticoagulation protocol: Yes   Plan:  Lovenox 1 mg/kg (75 mg) SQ every 12 hours Monitor renal function Monitor CBC, platelets   Maymuna Detzel Bennett 07/12/2015,10:39 AM

## 2015-07-13 ENCOUNTER — Encounter (HOSPITAL_COMMUNITY): Payer: Self-pay | Admitting: Adult Health

## 2015-07-13 ENCOUNTER — Encounter (HOSPITAL_COMMUNITY)
Admission: EM | Disposition: A | Discharge status: Home / Self Care | Payer: Self-pay | Source: Home / Self Care | Attending: Family Medicine

## 2015-07-13 DIAGNOSIS — F17210 Nicotine dependence, cigarettes, uncomplicated: Secondary | ICD-10-CM | POA: Diagnosis present

## 2015-07-13 DIAGNOSIS — E785 Hyperlipidemia, unspecified: Secondary | ICD-10-CM | POA: Diagnosis not present

## 2015-07-13 DIAGNOSIS — I252 Old myocardial infarction: Secondary | ICD-10-CM | POA: Insufficient documentation

## 2015-07-13 DIAGNOSIS — I25110 Atherosclerotic heart disease of native coronary artery with unstable angina pectoris: Secondary | ICD-10-CM

## 2015-07-13 DIAGNOSIS — E7800 Pure hypercholesterolemia, unspecified: Secondary | ICD-10-CM | POA: Diagnosis present

## 2015-07-13 DIAGNOSIS — R079 Chest pain, unspecified: Secondary | ICD-10-CM

## 2015-07-13 DIAGNOSIS — I249 Acute ischemic heart disease, unspecified: Secondary | ICD-10-CM | POA: Diagnosis not present

## 2015-07-13 DIAGNOSIS — I214 Non-ST elevation (NSTEMI) myocardial infarction: Secondary | ICD-10-CM | POA: Diagnosis present

## 2015-07-13 DIAGNOSIS — Z8249 Family history of ischemic heart disease and other diseases of the circulatory system: Secondary | ICD-10-CM | POA: Diagnosis not present

## 2015-07-13 DIAGNOSIS — I209 Angina pectoris, unspecified: Secondary | ICD-10-CM

## 2015-07-13 DIAGNOSIS — Z6825 Body mass index (BMI) 25.0-25.9, adult: Secondary | ICD-10-CM | POA: Diagnosis not present

## 2015-07-13 DIAGNOSIS — Z9114 Patient's other noncompliance with medication regimen: Secondary | ICD-10-CM | POA: Diagnosis not present

## 2015-07-13 DIAGNOSIS — I10 Essential (primary) hypertension: Secondary | ICD-10-CM | POA: Diagnosis not present

## 2015-07-13 DIAGNOSIS — Z720 Tobacco use: Secondary | ICD-10-CM

## 2015-07-13 DIAGNOSIS — I4510 Unspecified right bundle-branch block: Secondary | ICD-10-CM | POA: Diagnosis present

## 2015-07-13 DIAGNOSIS — Z79899 Other long term (current) drug therapy: Secondary | ICD-10-CM | POA: Diagnosis not present

## 2015-07-13 HISTORY — PX: CARDIAC CATHETERIZATION: SHX172

## 2015-07-13 LAB — CBC
HCT: 46.2 % (ref 39.0–52.0)
Hemoglobin: 15.6 g/dL (ref 13.0–17.0)
MCH: 31.3 pg (ref 26.0–34.0)
MCHC: 33.8 g/dL (ref 30.0–36.0)
MCV: 92.8 fL (ref 78.0–100.0)
Platelets: 192 10*3/uL (ref 150–400)
RBC: 4.98 MIL/uL (ref 4.22–5.81)
RDW: 12.6 % (ref 11.5–15.5)
WBC: 5.9 10*3/uL (ref 4.0–10.5)

## 2015-07-13 LAB — BASIC METABOLIC PANEL
Anion gap: 6 (ref 5–15)
BUN: 18 mg/dL (ref 6–20)
CALCIUM: 8.9 mg/dL (ref 8.9–10.3)
CO2: 29 mmol/L (ref 22–32)
Chloride: 104 mmol/L (ref 101–111)
Creatinine, Ser: 0.88 mg/dL (ref 0.61–1.24)
GFR calc Af Amer: >60 mL/min (ref >60)
GLUCOSE: 103 mg/dL — AB (ref 65–99)
Potassium: 4.2 mmol/L (ref 3.5–5.1)
Sodium: 139 mmol/L (ref 135–145)

## 2015-07-13 LAB — POCT ACTIVATED CLOTTING TIME: Activated Clotting Time: 313 seconds

## 2015-07-13 SURGERY — LEFT HEART CATH AND CORONARY ANGIOGRAPHY

## 2015-07-13 MED ORDER — NITROGLYCERIN 1 MG/10 ML FOR IR/CATH LAB
INTRA_ARTERIAL | Status: AC
  Filled 2015-07-13: qty 10

## 2015-07-13 MED ORDER — MIDAZOLAM HCL 2 MG/2ML IJ SOLN
INTRAMUSCULAR | Status: AC
  Filled 2015-07-13: qty 4

## 2015-07-13 MED ORDER — HEPARIN (PORCINE) IN NACL 2-0.9 UNIT/ML-% IJ SOLN
INTRAMUSCULAR | Status: AC
  Filled 2015-07-13: qty 1000

## 2015-07-13 MED ORDER — VERAPAMIL HCL 2.5 MG/ML IV SOLN
INTRAVENOUS | Status: AC
  Filled 2015-07-13: qty 2

## 2015-07-13 MED ORDER — HEPARIN SODIUM (PORCINE) 1000 UNIT/ML IJ SOLN
INTRAMUSCULAR | Status: AC
  Filled 2015-07-13: qty 1

## 2015-07-13 MED ORDER — CLOPIDOGREL BISULFATE 75 MG PO TABS
75.0000 mg | ORAL_TABLET | Freq: Every day | ORAL | Status: DC
  Administered 2015-07-14: 75 mg via ORAL
  Filled 2015-07-13: qty 1

## 2015-07-13 MED ORDER — SODIUM CHLORIDE 0.9 % WEIGHT BASED INFUSION
1.0000 mL/kg/h | INTRAVENOUS | Status: DC
  Administered 2015-07-13: 1 mL/kg/h via INTRAVENOUS

## 2015-07-13 MED ORDER — CLOPIDOGREL BISULFATE 75 MG PO TABS
ORAL_TABLET | ORAL | Status: DC | PRN
  Administered 2015-07-13: 600 mg via ORAL

## 2015-07-13 MED ORDER — HEPARIN SODIUM (PORCINE) 1000 UNIT/ML IJ SOLN
INTRAMUSCULAR | Status: DC | PRN
  Administered 2015-07-13: 6000 [IU] via INTRAVENOUS
  Administered 2015-07-13: 4000 [IU] via INTRAVENOUS

## 2015-07-13 MED ORDER — FENTANYL CITRATE (PF) 100 MCG/2ML IJ SOLN
INTRAMUSCULAR | Status: DC | PRN
  Administered 2015-07-13: 50 ug via INTRAVENOUS

## 2015-07-13 MED ORDER — SODIUM CHLORIDE 0.9 % IJ SOLN
3.0000 mL | Freq: Two times a day (BID) | INTRAMUSCULAR | Status: DC
  Administered 2015-07-13: 22:00:00 3 mL via INTRAVENOUS

## 2015-07-13 MED ORDER — LIDOCAINE HCL (PF) 1 % IJ SOLN
INTRAMUSCULAR | Status: AC
  Filled 2015-07-13: qty 30

## 2015-07-13 MED ORDER — SODIUM CHLORIDE 0.9 % IJ SOLN: 3.0000 mL | Freq: Two times a day (BID) | INTRAMUSCULAR | Status: DC

## 2015-07-13 MED ORDER — NITROGLYCERIN 2 % TD OINT
0.5000 [in_us] | TOPICAL_OINTMENT | Freq: Four times a day (QID) | TRANSDERMAL | Status: DC
  Administered 2015-07-13: 0.5 [in_us] via TOPICAL
  Filled 2015-07-13: qty 1
  Filled 2015-07-13: qty 30

## 2015-07-13 MED ORDER — VERAPAMIL HCL 2.5 MG/ML IV SOLN
INTRAVENOUS | Status: DC | PRN
  Administered 2015-07-13: 14:00:00 via INTRA_ARTERIAL

## 2015-07-13 MED ORDER — SODIUM CHLORIDE 0.9 % IV SOLN: 250.0000 mL | INTRAVENOUS | Status: DC | PRN

## 2015-07-13 MED ORDER — LIDOCAINE HCL (PF) 1 % IJ SOLN
INTRAMUSCULAR | Status: DC | PRN
  Administered 2015-07-13: 2 mL

## 2015-07-13 MED ORDER — MIDAZOLAM HCL 2 MG/2ML IJ SOLN
INTRAMUSCULAR | Status: DC | PRN
  Administered 2015-07-13: 2 mg via INTRAVENOUS

## 2015-07-13 MED ORDER — SODIUM CHLORIDE 0.9 % IV SOLN: INTRAVENOUS | Status: AC

## 2015-07-13 MED ORDER — ASPIRIN 81 MG PO CHEW: 81.0000 mg | CHEWABLE_TABLET | ORAL | Status: DC

## 2015-07-13 MED ORDER — SODIUM CHLORIDE 0.9 % IJ SOLN: 3.0000 mL | INTRAMUSCULAR | Status: DC | PRN

## 2015-07-13 MED ORDER — FENTANYL CITRATE (PF) 100 MCG/2ML IJ SOLN
INTRAMUSCULAR | Status: AC
  Filled 2015-07-13: qty 4

## 2015-07-13 MED ORDER — IOHEXOL 350 MG/ML SOLN
INTRAVENOUS | Status: DC | PRN
  Administered 2015-07-13: 100 mL via INTRAVENOUS
  Administered 2015-07-13: 50 mL via INTRAVENOUS

## 2015-07-13 MED ORDER — ASPIRIN 81 MG PO CHEW: 81.0000 mg | CHEWABLE_TABLET | ORAL | Status: AC

## 2015-07-13 MED ORDER — NITROGLYCERIN 1 MG/10 ML FOR IR/CATH LAB
INTRA_ARTERIAL | Status: DC | PRN
  Administered 2015-07-13: 15:00:00

## 2015-07-13 SURGICAL SUPPLY — 19 items
BALLN EMERGE MR 2.5X12 (BALLOONS) ×3
BALLN ~~LOC~~ EMERGE MR 3.0X20 (BALLOONS) ×3
BALLOON EMERGE MR 2.5X12 (BALLOONS) IMPLANT
BALLOON ~~LOC~~ EMERGE MR 3.0X20 (BALLOONS) IMPLANT
CATH INFINITI 5 FR JL3.5 (CATHETERS) ×2 IMPLANT
CATH INFINITI 5FR ANG PIGTAIL (CATHETERS) ×3 IMPLANT
CATH INFINITI JR4 5F (CATHETERS) ×3 IMPLANT
CATH VISTA GUIDE 6FR XB3 (CATHETERS) ×3 IMPLANT
DEVICE RAD COMP TR BAND LRG (VASCULAR PRODUCTS) ×3 IMPLANT
GLIDESHEATH SLEND SS 6F .021 (SHEATH) ×2 IMPLANT
KIT ENCORE 26 ADVANTAGE (KITS) ×2 IMPLANT
KIT HEART LEFT (KITS) ×3 IMPLANT
PACK CARDIAC CATHETERIZATION (CUSTOM PROCEDURE TRAY) ×3 IMPLANT
STENT PROMUS PREM MR 2.75X28 (Permanent Stent) ×3 IMPLANT
SYR MEDRAD MARK V 150ML (SYRINGE) ×3 IMPLANT
TRANSDUCER W/STOPCOCK (MISCELLANEOUS) ×3 IMPLANT
TUBING CIL FLEX 10 FLL-RA (TUBING) ×3 IMPLANT
WIRE COUGAR XT STRL 190CM (WIRE) ×3 IMPLANT
WIRE SAFE-T 1.5MM-J .035X260CM (WIRE) ×3 IMPLANT

## 2015-07-13 NOTE — Consult Note (Signed)
CARDIOLOGY CONSULT NOTE   Patient ID: Anthony Hensley MRN: [id number] DOB/AGE: 15-Nov-1960 54 y.o.  Admit Date: 07/11/2015 Referring Physician: PTH-Goodrich Md Primary Physician: No PCP Per Patient Consulting Cardiologist: Bronson Ing, MD Primary Cardiologist : Formerly Wilhemina Cash (consulted in 2004) Reason for Consultation: ACS  Clinical Summary Mr. Ledee is a 54 y.o.male history of hypertension, with non-obstructive CAD per cardiac cath on 6/24/ 2004, ongoing tobacco abuse, and hx of pre-mature cardiac disease in family, without cardiac follow up since 2004. He has not seen his PCP, [name], NP for 2-3 years. States he had been on medication for hypertension and hypercholesterolemia, but stopped taking it when his BP and cholesterol improved.    Comes to ER with complaints of  Sharp left sided chest pain that has been occuring over the last couple of days, becoming more severe, lasting 5-10 minutes associated with exertion (walking). He also had complaints of worsening dyspnea and fatigue. On arrival to ER, BP 130/103, HR 70, )2 sat 97%. Initial troponin 0.08; 0.09;0.07 , 0.05 respectively.EKG reveled NSR with incomplete RBBB without acute ST-T wave abnormalities.   Dr.Goodrich psoke with Dr. Haroldine Laws over the weekend who suggested that remain at [facility] over the weekend and then transfer for cath this am. He has been placed on ASA 81 mg daily, LMWH, enalapril and atorvastatin. He has been held NPO for cardiac cath.   He has been having intermittent chest discomfort this am, described as sharp, but not severe as on admission.   No Known Allergies  Medications Scheduled Medications: . aspirin EC  81 mg Oral Daily  . atorvastatin  80 mg Oral q1800  . enalapril  10 mg Oral Daily  . enoxaparin (LOVENOX) injection  1 mg/kg Subcutaneous Q12H  . metoprolol tartrate  12.5 mg Oral BID  . nicotine  21 mg Transdermal Daily  . pantoprazole  40 mg Oral Daily    Infusions:    PRN  Medications: acetaminophen, nitroGLYCERIN, ondansetron (ZOFRAN) IV, zolpidem   Past Medical History  Diagnosis Date  . Hypertension   . Hypercholesterolemia     History reviewed. No pertinent past surgical history.  Family History  Problem Relation Age of Onset  . Heart attack Father 66    Deceased    Social History Mr. Marsalis reports that he has been smoking Cigarettes.  He has a 52.5 pack-year smoking history. He has never used smokeless tobacco. Mr. Wholey reports that he does not drink alcohol.  Review of Systems Complete review of systems are found to be negative unless outlined in H&P above.  Physical Examination Blood pressure 119/86, pulse 76, temperature 98.5 F (36.9 C), temperature source Oral, resp. rate 16, height 5\' 8"  (1.727 m), weight 163 lb 8 oz (74.163 kg), SpO2 98 %.  Intake/Output Summary (Last 24 hours) at 07/13/15 0817 Last data filed at 07/12/15 2200  Gross per 24 hour  Intake   1020 ml  Output      1 ml  Net   1019 ml    Telemetry:SR with incomplete RBBB.  GEN:No acute distress  HEENT: Conjunctiva and lids normal, oropharynx clear with moist mucosa. Neck: Supple, no elevated JVP or carotid bruits, no thyromegaly. Lungs: Clear to auscultation, nonlabored breathing at rest. Cardiac: Regular rate and rhythm, no S3 or significant systolic murmur, no pericardial rub. Abdomen: Soft, nontender, no hepatomegaly, bowel sounds present, no guarding or rebound. Extremities: No pitting edema, distal pulses 2+.Lypoma on medial lateral right lower leg below the knee.  Skin:  Warm and dry. Musculoskeletal: No kyphosis. Neuropsychiatric: Alert and oriented x3, affect grossly appropriate.  Prior Cardiac Testing/Procedures 1.Cardiac Cath RESULTS: The left ventricular pressure was 116/5 with an end diastolic pressure of 7 mmHg. The aortic pressure was 107/75 with a mean arterial pressure of 95 mmHg. Selective coronary angiography: The left main coronary  artery is a short artery which is angiographically normal. The left anterior descending coronary artery is a moderate caliber vessel which has a 25% stenosis in its mid portion between the first and second diagonal. The first and second diagonal are small arteries. The remainder of the LAD is angiographically normal. The circumflex coronary artery is a moderate caliber vessel which is angiographically normal and gives off one large obtuse marginal and a small obtuse marginal. The right coronary artery is a moderate caliber dominant vessel giving off a moderate caliber left anterior descending coronary artery. It is angiographically normal. Left ventriculogram reveals an ejection fraction estimated at 60% with no wall motion abnormalities and no mitral regurgitation. ASSESSMENT: This is a man with nonobstructive coronary disease in his left anterior descending coronary artery.  Lab Results  Basic Metabolic Panel:  Recent Labs Lab 07/11/15 1324 07/13/15 0628  NA 136 139  K 4.0 4.2  CL 105 104  CO2 24 29  GLUCOSE 93 103*  BUN 19 18  CREATININE 0.86 0.88  CALCIUM 8.7* 8.9    Liver Function Tests:  Recent Labs Lab 07/11/15 1324  AST 21  ALT 27  ALKPHOS 74  BILITOT 0.3  PROT 7.2  ALBUMIN 4.0    CBC:  Recent Labs Lab 07/11/15 1324 07/13/15 0628  WBC 4.8 5.9  NEUTROABS 2.7  --   HGB 15.7 15.6  HCT 46.1 46.2  MCV 91.5 92.8  PLT 178 192    Cardiac Enzymes:  Recent Labs Lab 07/11/15 1817 07/11/15 2358 07/12/15 0615  TROPONINI 0.09* 0.07* 0.05*    Radiology: Dg Chest Portable 1 View  07/11/2015   CLINICAL DATA:  Acute shortness of breath, chest pain and hypertension  EXAM: PORTABLE CHEST 1 VIEW  COMPARISON:  02/18/2015  FINDINGS: The heart size and mediastinal contours are within normal limits. Both lungs are clear. The visualized skeletal structures are unremarkable. Monitor leads overlie the chest.  IMPRESSION: No active disease.    Electronically Signed   By: Jerilynn Mages.  Shick M.D.   On: 07/11/2015 13:56     ECG: NSR with incomplete RBBB. Rate of 76 bpm.   Impression and Recommendations 1. NSTEMI with angina symptoms: No acute ST-T wave changes on EKG, troponin only slightly elevated. On going chest discomfort this am. Will place NTG paste 1 inch to chest wall. CVRF include hypertension, hypercholesterolemia, ongoing tobacco abuse, FH of premature CAD. Patient is NPO for cardiac cath at the recommendation of Dr. Vaughan Browner. Will make plans for transfer this am.  2. Non-obstructive CAD: Per cardiac cath 2004, with LAD of 25% stenosis. Continue ASA, Statin, ACE.   3. Ongoing tobacco abuse; Smoking since age 75. Cessation discussed.   4. Hypercholesterolemia: Continue statin.  5. Medical non-compliance: Has not been taking medications for 2 years.    Signed: Phill Myron. Lawrence NP Morton  07/13/2015, 8:17 AM Co-Sign MD  The patient was seen and examined, and I agree with the assessment and plan as documented above, with modifications as noted below. 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 Pain subsided over next 24 hours with medications but had recurrence earlier this am. ECG unremarkable with only minimal troponin elevation. Overall presentation appears to be consistent with NSTE-ACS (unstable angina, given minimal troponin elevation). TIMI risk score is 4, intermediate risk (roughly 20%) for all-cause mortality, new or recurrent MI, or severe recurrent ischemia requiring urgent revascularization. Currently on ASA, statin, ACEI, beta blocker, and enoxaparin. Dr. Sarajane Jews previously discussed transferring patient to [facility] with Dr. Haroldine Laws for coronary angiography. Will proceed as planned.

## 2015-07-13 NOTE — Care Management Note (Signed)
Case Management Note  Patient Details  Name: Anthony Hensley MRN: [id number] Date of Birth: 06-29-1961  Subjective/Objective:                  Pt admitted from home with CP. Pt lives with significant other and will return home at discharge. Pt is independent with ADL's.  Action/Plan: Pt for transfer to [facility] for cardiac cath. CM on receiving unit to follow for discharge planning needs.  Expected Discharge Date:                  Expected Discharge Plan:  Acute to Acute Transfer  In-House Referral:  NA  Discharge planning Services  CM Consult  Post Acute Care Choice:  NA Choice offered to:  NA  DME Arranged:    DME Agency:     HH Arranged:    HH Agency:     Status of Service:  Completed, signed off  Medicare Important Message Given:    Date Medicare IM Given:    Medicare IM give by:    Date Additional Medicare IM Given:    Additional Medicare Important Message give by:     If discussed at [facility] of Stay Meetings, dates discussed:    Additional Comments:  Joylene Draft, RN 07/13/2015, 11:13 AM

## 2015-07-13 NOTE — Progress Notes (Signed)
Patient arrived to 3E23 at 44 from [facility] with ACS - scheduled for cath today.  NPO except sips with meds - explained to patient.  Consent for cardiac catheterization obtained, right radial wrist clipped and NS started at 74.58ml/hr.  Pt alert and oriented and denies pain.  Ntg paste and Nicotine patch on.

## 2015-07-13 NOTE — Interval H&P Note (Signed)
History and Physical Interval Note:  07/13/2015 2:03 PM  Anthony Hensley  has presented today for cardiac cath with the diagnosis of non stemi/unstable angina. The various methods of treatment have been discussed with the patient and family. After consideration of risks, benefits and other options for treatment, the patient has consented to  Procedure(s): Left Heart Cath and Coronary Angiography (N/A) as a surgical intervention .  The patient's history has been reviewed, patient examined, no change in status, stable for surgery.  I have reviewed the patient's chart and labs.  Questions were answered to the patient's satisfaction.    Cath Lab Visit (complete for each Cath Lab visit)  Clinical Evaluation Leading to the Procedure:   ACS: Yes.    Non-ACS:    Anginal Classification: CCS III  Anti-ischemic medical therapy: No Therapy  Non-Invasive Test Results: No non-invasive testing performed  Prior CABG: No previous CABG         Sharline Lehane

## 2015-07-13 NOTE — Progress Notes (Signed)
Transported to cath lab via stretcher.  Accompanied him to cath lab.

## 2015-07-13 NOTE — Progress Notes (Signed)
PROGRESS NOTE  Anthony Hensley ATF:[id number] DOB: 31-Mar-1961 DOA: 07/11/2015 PCP: No PCP Per Patient  Summary: 54 yom who presented with 1 week of intermittent sharp left side non radiating CP. Patient has several cardiac risk factors and was admitted for further workup and monitoring.  Assessment/Plan: 1. ACS. Clinically stable. Troponin trending down. EKG nonacute. Brief episode CP this morning that spontaneously resolved. Low risk nuclear stress 2004. RF include smoking, HTN, Fhx early MI in father age 10. 2. Hyperlipidemia. LDL 150. Will continue statin. 3. HTN, stable. Continue low dose beta-blockers.   Appears stable, currently pain free.  Transfer today to [facility] for [facility], discussed with Dr. Bronson Ing.  Code Status: Full DVT prophylaxis: Lovenox  Family Communication:Discussed with patient who understands and has no concerns at this time. Disposition Plan: Transfer to [facility]  Murray Hodgkins, MD  [facility]  Pager [phone] If 7PM-7AM, please contact night-coverage at [email or web address], password [email or web address] 07/13/2015, 7:08 AM  LOS: 1 day   Consultants:  Cardiology   Procedures:    Antibiotics:    HPI/Subjective: Doing well. Around 0600 experienced chest pain that spontaneously resolved within 45 minutes. Denies diaphoresis, SOB, nausea, or vomiting.  Objective: Filed Vitals:   07/12/15 1119 07/12/15 1312 07/12/15 2114 07/13/15 0558  BP:  146/94 151/94 119/86  Pulse: 67 62 71 76  Temp:  97.7 F (36.5 C) 98.2 F (36.8 C) 98.5 F (36.9 C)  TempSrc:  Oral Oral Oral  Resp:  18 17 16   Height:      Weight:      SpO2:  97% 100% 98%    Intake/Output Summary (Last 24 hours) at 07/13/15 0708 Last data filed at 07/12/15 2200  Gross per 24 hour  Intake   1020 ml  Output      1 ml  Net   1019 ml     Filed Weights   07/11/15 1316 07/11/15 1749  Weight: 72.576 kg (160 lb) 74.163 kg (163 lb 8 oz)    Exam: Afebrile, VSS, no hypoxia General:  Appears comfortable,  calm. Sitting on the edge of the bed.  Cardiovascular: Regular rate and rhythm, no murmur, rub or gallop. No lower extremity edema. Telemetry: Sinus rhythm Psychiatric: grossly normal mood and affect, speech fluent and appropriate   New data reviewed:  BMP and CBC unremarkable.   Pertinent data since admission:  CXR NAD independently reviewed  EKG 10/1: SR, incomplete RBBB, NSCSLT 06/01/2015.  Pending data:   Scheduled Meds: . aspirin EC  81 mg Oral Daily  . atorvastatin  80 mg Oral q1800  . enalapril  10 mg Oral Daily  . enoxaparin (LOVENOX) injection  1 mg/kg Subcutaneous Q12H  . metoprolol tartrate  12.5 mg Oral BID  . nicotine  21 mg Transdermal Daily  . pantoprazole  40 mg Oral Daily   Continuous Infusions:   Principal Problem:   ACS (acute coronary syndrome) ([place]) Active Problems:   Chest pain   Essential hypertension   Hyperlipidemia   Time spent 25 minutes,   By signing my name below, I, Rennis Harding attest that this documentation has been prepared under the direction and in the presence of Murray Hodgkins, MD Electronically signed: Rennis Harding  07/13/2015 9:48 AM  I personally performed the services described in this documentation. All medical record entries made by the scribe were at my direction. I have reviewed the chart and agree that the record reflects my personal performance and is accurate and complete. Murray Hodgkins, MD

## 2015-07-13 NOTE — Progress Notes (Addendum)
TR BAND REMOVAL  LOCATION:    right radial  DEFLATED PER PROTOCOL:    Yes.    TIME BAND OFF / DRESSING APPLIED:    1845  SITE UPON ARRIVAL:    Level 0  SITE AFTER BAND REMOVAL:    Level 0  CIRCULATION SENSATION AND MOVEMENT:    Within Normal Limits   Yes.    COMMENTS:   Rechecked dressing at 1915 with no change in assessment noted, CSMs wnls and dressing dry and intact

## 2015-07-13 NOTE — H&P (View-Only) (Signed)
CARDIOLOGY CONSULT NOTE   Patient ID: Anthony Hensley MRN: [id number] DOB/AGE: 1961-05-09 54 y.o.  Admit Date: 07/11/2015 Referring Physician: PTH-Goodrich Md Primary Physician: No PCP Per Patient Consulting Cardiologist: Bronson Ing, MD Primary Cardiologist : Formerly Wilhemina Cash (consulted in 2004) Reason for Consultation: ACS  Clinical Summary Anthony Hensley is a 54 y.o.male history of hypertension, with non-obstructive CAD per cardiac cath on 6/24/ 2004, ongoing tobacco abuse, and hx of pre-mature cardiac disease in family, without cardiac follow up since 2004. He has not seen his PCP, Anthony Cheadle, NP for 2-3 years. States he had been on medication for hypertension and hypercholesterolemia, but stopped taking it when his BP and cholesterol improved.    Comes to ER with complaints of  Sharp left sided chest pain that has been occuring over the last couple of days, becoming more severe, lasting 5-10 minutes associated with exertion (walking). He also had complaints of worsening dyspnea and fatigue. On arrival to ER, BP 130/103, HR 70, )2 sat 97%. Initial troponin 0.08; 0.09;0.07 , 0.05 respectively.EKG reveled NSR with incomplete RBBB without acute ST-T wave abnormalities.   Dr.Goodrich psoke with Dr. Haroldine Laws over the weekend who suggested that remain at [facility] over the weekend and then transfer for cath this am. He has been placed on ASA 81 mg daily, LMWH, enalapril and atorvastatin. He has been held NPO for cardiac cath.   He has been having intermittent chest discomfort this am, described as sharp, but not severe as on admission.   No Known Allergies  Medications Scheduled Medications: . aspirin EC  81 mg Oral Daily  . atorvastatin  80 mg Oral q1800  . enalapril  10 mg Oral Daily  . enoxaparin (LOVENOX) injection  1 mg/kg Subcutaneous Q12H  . metoprolol tartrate  12.5 mg Oral BID  . nicotine  21 mg Transdermal Daily  . pantoprazole  40 mg Oral Daily    Infusions:    PRN  Medications: acetaminophen, nitroGLYCERIN, ondansetron (ZOFRAN) IV, zolpidem   Past Medical History  Diagnosis Date  . Hypertension   . Hypercholesterolemia     History reviewed. No pertinent past surgical history.  Family History  Problem Relation Age of Onset  . Heart attack Father 1    Deceased    Social History Anthony Hensley reports that he has been smoking Cigarettes.  He has a 52.5 pack-year smoking history. He has never used smokeless tobacco. Anthony Hensley reports that he does not drink alcohol.  Review of Systems Complete review of systems are found to be negative unless outlined in H&P above.  Physical Examination Blood pressure 119/86, pulse 76, temperature 98.5 F (36.9 C), temperature source Oral, resp. rate 16, height 5\' 8"  (1.727 m), weight 163 lb 8 oz (74.163 kg), SpO2 98 %.  Intake/Output Summary (Last 24 hours) at 07/13/15 0817 Last data filed at 07/12/15 2200  Gross per 24 hour  Intake   1020 ml  Output      1 ml  Net   1019 ml    Telemetry:SR with incomplete RBBB.  GEN:No acute distress  HEENT: Conjunctiva and lids normal, oropharynx clear with moist mucosa. Neck: Supple, no elevated JVP or carotid bruits, no thyromegaly. Lungs: Clear to auscultation, nonlabored breathing at rest. Cardiac: Regular rate and rhythm, no S3 or significant systolic murmur, no pericardial rub. Abdomen: Soft, nontender, no hepatomegaly, bowel sounds present, no guarding or rebound. Extremities: No pitting edema, distal pulses 2+.Lypoma on medial lateral right lower leg below the knee.  Skin:  Warm and dry. Musculoskeletal: No kyphosis. Neuropsychiatric: Alert and oriented x3, affect grossly appropriate.  Prior Cardiac Testing/Procedures 1.Cardiac Cath RESULTS: The left ventricular pressure was 116/5 with an end diastolic pressure of 7 mmHg. The aortic pressure was 107/75 with a mean arterial pressure of 95 mmHg. Selective coronary angiography: The left main coronary  artery is a short artery which is angiographically normal. The left anterior descending coronary artery is a moderate caliber vessel which has a 25% stenosis in its mid portion between the first and second diagonal. The first and second diagonal are small arteries. The remainder of the LAD is angiographically normal. The circumflex coronary artery is a moderate caliber vessel which is angiographically normal and gives off one large obtuse marginal and a small obtuse marginal. The right coronary artery is a moderate caliber dominant vessel giving off a moderate caliber left anterior descending coronary artery. It is angiographically normal. Left ventriculogram reveals an ejection fraction estimated at 60% with no wall motion abnormalities and no mitral regurgitation. ASSESSMENT: This is a man with nonobstructive coronary disease in his left anterior descending coronary artery.  Lab Results  Basic Metabolic Panel:  Recent Labs Lab 07/11/15 1324 07/13/15 0628  NA 136 139  K 4.0 4.2  CL 105 104  CO2 24 29  GLUCOSE 93 103*  BUN 19 18  CREATININE 0.86 0.88  CALCIUM 8.7* 8.9    Liver Function Tests:  Recent Labs Lab 07/11/15 1324  AST 21  ALT 27  ALKPHOS 74  BILITOT 0.3  PROT 7.2  ALBUMIN 4.0    CBC:  Recent Labs Lab 07/11/15 1324 07/13/15 0628  WBC 4.8 5.9  NEUTROABS 2.7  --   HGB 15.7 15.6  HCT 46.1 46.2  MCV 91.5 92.8  PLT 178 192    Cardiac Enzymes:  Recent Labs Lab 07/11/15 1817 07/11/15 2358 07/12/15 0615  TROPONINI 0.09* 0.07* 0.05*    Radiology: Dg Chest Portable 1 View  07/11/2015   CLINICAL DATA:  Acute shortness of breath, chest pain and hypertension  EXAM: PORTABLE CHEST 1 VIEW  COMPARISON:  02/18/2015  FINDINGS: The heart size and mediastinal contours are within normal limits. Both lungs are clear. The visualized skeletal structures are unremarkable. Monitor leads overlie the chest.  IMPRESSION: No active disease.    Electronically Signed   By: Jerilynn Mages.  Shick M.D.   On: 07/11/2015 13:56     ECG: NSR with incomplete RBBB. Rate of 76 bpm.   Impression and Recommendations 1. NSTEMI with angina symptoms: No acute ST-T wave changes on EKG, troponin only slightly elevated. On going chest discomfort this am. Will place NTG paste 1 inch to chest wall. CVRF include hypertension, hypercholesterolemia, ongoing tobacco abuse, FH of premature CAD. Patient is NPO for cardiac cath at the recommendation of Dr. Vaughan Browner. Will make plans for transfer this am.  2. Non-obstructive CAD: Per cardiac cath 2004, with LAD of 25% stenosis. Continue ASA, Statin, ACE.   3. Ongoing tobacco abuse; Smoking since age 76. Cessation discussed.   4. Hypercholesterolemia: Continue statin.  5. Medical non-compliance: Has not been taking medications for 2 years.    Signed: Phill Myron. Lawrence NP Rosalie  07/13/2015, 8:17 AM Co-Sign MD  The patient was seen and examined, and I agree with the assessment and plan as documented above, with modifications as noted below. 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 Pain subsided over next 24 hours with medications but had recurrence earlier this am. ECG unremarkable with only minimal troponin elevation. Overall presentation appears to be consistent with NSTE-ACS (unstable angina, given minimal troponin elevation). TIMI risk score is 4, intermediate risk (roughly 20%) for all-cause mortality, new or recurrent MI, or severe recurrent ischemia requiring urgent revascularization. Currently on ASA, statin, ACEI, beta blocker, and enoxaparin. Dr. Sarajane Jews previously discussed transferring patient to [facility] with Dr. Haroldine Laws for coronary angiography. Will proceed as planned.

## 2015-07-14 ENCOUNTER — Encounter (HOSPITAL_COMMUNITY): Payer: Self-pay | Admitting: Cardiovascular Disease

## 2015-07-14 ENCOUNTER — Inpatient Hospital Stay (HOSPITAL_COMMUNITY): Payer: PRIVATE HEALTH INSURANCE

## 2015-07-14 DIAGNOSIS — R079 Chest pain, unspecified: Secondary | ICD-10-CM

## 2015-07-14 LAB — CBC
HCT: 46.1 % (ref 39.0–52.0)
Hemoglobin: 15.6 g/dL (ref 13.0–17.0)
MCH: 31.1 pg (ref 26.0–34.0)
MCHC: 33.8 g/dL (ref 30.0–36.0)
MCV: 92 fL (ref 78.0–100.0)
PLATELETS: 195 10*3/uL (ref 150–400)
RBC: 5.01 MIL/uL (ref 4.22–5.81)
RDW: 12.8 % (ref 11.5–15.5)
WBC: 6.8 10*3/uL (ref 4.0–10.5)

## 2015-07-14 LAB — BASIC METABOLIC PANEL
Anion gap: 8 (ref 5–15)
BUN: 22 mg/dL — AB (ref 6–20)
CO2: 24 mmol/L (ref 22–32)
Calcium: 9 mg/dL (ref 8.9–10.3)
Chloride: 104 mmol/L (ref 101–111)
Creatinine, Ser: 1.13 mg/dL (ref 0.61–1.24)
GFR calc Af Amer: >60 mL/min (ref >60)
GLUCOSE: 105 mg/dL — AB (ref 65–99)
Potassium: 4 mmol/L (ref 3.5–5.1)
Sodium: 136 mmol/L (ref 135–145)

## 2015-07-14 MED ORDER — ASPIRIN 81 MG PO TBEC: 81.0000 mg | DELAYED_RELEASE_TABLET | Freq: Every day | ORAL | Status: DC

## 2015-07-14 MED ORDER — ENALAPRIL MALEATE 10 MG PO TABS: 10.0000 mg | ORAL_TABLET | Freq: Every day | ORAL | Status: DC

## 2015-07-14 MED ORDER — METOPROLOL TARTRATE 25 MG PO TABS: 12.5000 mg | ORAL_TABLET | Freq: Two times a day (BID) | ORAL | Status: DC

## 2015-07-14 MED ORDER — ENALAPRIL MALEATE 10 MG PO TABS
10.0000 mg | ORAL_TABLET | Freq: Every day | ORAL | Status: DC
Start: 2015-07-14 — End: 2015-12-12

## 2015-07-14 MED ORDER — CLOPIDOGREL BISULFATE 75 MG PO TABS: 75.0000 mg | ORAL_TABLET | Freq: Every day | ORAL | Status: DC

## 2015-07-14 MED ORDER — PANTOPRAZOLE SODIUM 40 MG PO TBEC: 40.0000 mg | DELAYED_RELEASE_TABLET | Freq: Every day | ORAL | Status: DC

## 2015-07-14 MED ORDER — ASPIRIN 81 MG PO TBEC: 81.0000 mg | DELAYED_RELEASE_TABLET | Freq: Every day | ORAL | Status: AC

## 2015-07-14 MED ORDER — NITROGLYCERIN 0.4 MG SL SUBL: 0.4000 mg | SUBLINGUAL_TABLET | SUBLINGUAL | Status: DC | PRN

## 2015-07-14 MED ORDER — ATORVASTATIN CALCIUM 80 MG PO TABS
80.0000 mg | ORAL_TABLET | Freq: Every day | ORAL | Status: DC
Start: 2015-07-14 — End: 2016-01-11

## 2015-07-14 MED ORDER — NICOTINE 21 MG/24HR TD PT24: 21.0000 mg | MEDICATED_PATCH | Freq: Every day | TRANSDERMAL | Status: DC

## 2015-07-14 MED ORDER — ATORVASTATIN CALCIUM 80 MG PO TABS: 80.0000 mg | ORAL_TABLET | Freq: Every day | ORAL | Status: DC

## 2015-07-14 NOTE — Discharge Summary (Signed)
Physician Discharge Summary  Patient ID: Anthony Hensley MRN: [id number] DOB/AGE: September 29, 1961 54 y.o.   Primary Cardiologist: Dr. Bronson Ing  Admit date: 07/11/2015 Discharge date: 07/14/2015  Admission Diagnoses: Acute Coronary Syndrome  Discharge Diagnoses:  Principal Problem:   ACS (acute coronary syndrome) [facility]) Active Problems:   Chest pain   Essential hypertension   Hyperlipidemia   NSTEMI (non-ST elevated myocardial infarction) [facility])   Discharged Condition: stable  Hospital Course: Mr. Anthony Hensley is a 54 y.o.male history of hypertension, with non-obstructive CAD per cardiac cath on 6/24/ 2004, ongoing tobacco abuse, and hx of pre-mature cardiac disease in family, without cardiac follow up since 2004. He has not seen his PCP, [name], NP for 2-3 years. States he had been on medication for hypertension and hypercholesterolemia, but stopped taking it when his BP and cholesterol improved.   [facility] to [facility] on 07/11/15 with complain of intermittent CP x 2-3 days, which progressed to severe CP day of presentation. On arrival to ER, BP was 130/103, HR 70, O2 sat 97%. Initial troponin was mildly abnormal at 0.08; 0.09;0.07 , 0.05 respectively. EKG reveled NSR with incomplete RBBB without acute ST-T wave abnormalities. He was transferred to [facility] for further care.   On 07/13/15 he underwent a LHC by Dr. Angelena Form. Access was obtained via the right radial artery. He was found to have severe, 99% stenosis of the mid circumflex. He underwent successful PCI + DES to the mid circ. LVEF was normal at 65%. There was mild nonobstructive residual disease (see complete angiographic details below). He tolerated the procedure well and left the cath lab in stable condition. He had no recurrent CP and no post cath complications. He was placed on DAPT with ASA + Plavix and high dose Lipitor (LDL was 150 mg/dL). He was also placed on a BB and ACE-I. Renal function remained stable as well as vital  signs. He was last seen and examined by Dr. Stanford Breed who determined he was stable for discharge home. Post hospital f/u has been arranged with Dr. Bronson Ing. Smoking cessation was strongly advised.  Consults: None  Significant Diagnostic Studies:  LHC 07/13/15  Conclusion     Prox RCA lesion, 20% stenosed.  Dist Cx lesion, 70% stenosed.  Prox LAD-1 lesion, 20% stenosed.  Prox LAD-2 lesion, 20% stenosed.  Mid LAD lesion, 20% stenosed.  1st Diag lesion, 30% stenosed.  Mid Cx lesion, 99% stenosed. There is a 0% residual stenosis post intervention.  A drug-eluting stent was placed extending from the mid Circumfelx into the Obtuse marginal branch  The left ventricular systolic function is normal.  1. NSTEMI/Unstable angina secondary to severe stenosis mid Circumflex.  2. Successful PTCA/DES x 1 mid Circumflex 3. Normal LV systolic function    Treatments: See Hospital Course  Discharge Exam: Blood pressure 147/93, pulse 79, temperature 97.7 F (36.5 C), temperature source Oral, resp. rate 12, height 5\' 8"  (1.727 m), weight 161 lb 2.5 oz (73.1 kg), SpO2 96 %.   Disposition: 01-Home or Self Care      Discharge Instructions    Amb Referral to Cardiac Rehabilitation    Complete by:  As directed   Congestive Heart Failure: If diagnosis is Heart Failure, patient MUST meet each of the CMS criteria: 1. Left Ventricular Ejection Fraction </= 35% 2. NYHA class II-IV symptoms despite being on optimal heart failure therapy for at least 6 weeks. 3. Stable = have not had a recent (<6 weeks) or planned (<6 months) major cardiovascular hospitalization  or procedure  Program Details: - Physician supervised classes - 1-3 classes per week over a 12-18 week period, generally for a total of 36 sessions  Physician Certification: I certify that the above Cardiac Rehabilitation treatment is medically necessary and is medically approved by me for treatment of this patient. The patient is  willing and cooperative, able to ambulate and medically stable to participate in exercise rehabilitation. The participant's progress and Individualized Treatment Plan will be reviewed by the Medical Director, Cardiac Rehab staff and as indicated by the Referring/Ordering Physician.  Diagnosis:   Myocardial Infarction PCI       Diet - low sodium heart healthy    Complete by:  As directed      Increase activity slowly    Complete by:  As directed             Medication List    STOP taking these medications        naproxen sodium 220 MG tablet  Commonly known as:  ANAPROX      TAKE these medications        aspirin 81 MG EC tablet  Take 1 tablet (81 mg total) by mouth daily.     atorvastatin 80 MG tablet  Commonly known as:  LIPITOR  Take 1 tablet (80 mg total) by mouth daily at 6 PM.     clopidogrel 75 MG tablet  Commonly known as:  PLAVIX  Take 1 tablet (75 mg total) by mouth daily with breakfast.     enalapril 10 MG tablet  Commonly known as:  VASOTEC  Take 1 tablet (10 mg total) by mouth daily.     metoprolol tartrate 25 MG tablet  Commonly known as:  LOPRESSOR  Take 0.5 tablets (12.5 mg total) by mouth 2 (two) times daily.     nicotine 21 mg/24hr patch  Commonly known as:  NICODERM CQ - dosed in mg/24 hours  Place 1 patch (21 mg total) onto the skin daily.     nitroGLYCERIN 0.4 MG SL tablet  Commonly known as:  NITROSTAT  Place 1 tablet (0.4 mg total) under the tongue every 5 (five) minutes x 3 doses as needed for chest pain.     pantoprazole 40 MG tablet  Commonly known as:  PROTONIX  Take 1 tablet (40 mg total) by mouth daily.     VISINE TEARS OP  Apply 1 drop to eye daily.       Follow-up Information    Follow up with Herminio Commons, MD On 07/31/2015.   Specialty:  Cardiology   Why:  10:20 am    Contact information:   [place] [address] [phone]      TIME SPENT ON DISCHARGE, INCLUDING PHYSICIAN TIME: >30  MINUTES   Signed: Kitti Mcclish 07/14/2015, 10:11 AM

## 2015-07-14 NOTE — Progress Notes (Signed)
CARDIAC REHAB PHASE I   PRE:  Rate/Rhythm: 80 SR  BP:  Supine: 147/93  Sitting:   Standing:    SaO2:   MODE:  Ambulation: 1000 ft   POST:  Rate/Rhythm: 87 SR  BP:  Supine:   Sitting: 160/106, 150/99  Standing:    SaO2:  0805-0905 Pt walked 1000 ft with steady gait. Tolerated well. No CP. MI education completed with pt who voiced understanding. Discussed tobacco cessation. Pt has quit for 5 years before. He has nicotine patch on now. Gave pt handout and fake cigarette. Will be difficult for pt as he stated he really enjoys smoking and his girlfriend smokes also. She is going to try to quit too. He is thinking about using the nicotine patches. Stressed importance of plavix with stent. Discussed importance of statin and hypertensive meds and not to discontinue. Discussed heart healthy food choices. Will refer to [place] Phase 2. Pt in agreement.   Graylon Good, RN BSN  07/14/2015 9:01 AM

## 2015-07-14 NOTE — Progress Notes (Signed)
Patient Profile: 54 y/o male with h/o tobacco abuse, HTN and HLD admitted for unstable angina/NSTEMI.   Subjective: No complaints. Denies any recurrent CP. No dyspnea. Ambulating w/o difficulty.   Objective: Vital signs in last 24 hours: Temp:  [97.6 F (36.4 C)-97.9 F (36.6 C)] 97.7 F (36.5 C) (10/04 0759) Pulse Rate:  [0-86] 79 (10/04 0759) Resp:  [0-18] 15 (10/04 0759) BP: (111-168)/(56-127) 147/93 mmHg (10/04 0759) SpO2:  [0 %-100 %] 96 % (10/04 0759) Weight:  [160 lb 11.2 oz (72.893 kg)-161 lb 2.5 oz (73.1 kg)] 161 lb 2.5 oz (73.1 kg) (10/04 0309) Last BM Date: 07/12/15  Intake/Output from previous day: 10/03 0701 - 10/04 0700 In: 502.5 [P.O.:240; I.V.:262.5] Out: 1300 [Urine:1300] Intake/Output this shift:    Medications Current Facility-Administered Medications  Medication Dose Route Frequency Provider Last Rate Last Dose  . 0.9 %  sodium chloride infusion  250 mL Intravenous PRN Burnell Blanks, MD      . acetaminophen (TYLENOL) tablet 650 mg  650 mg Oral Q4H PRN Truett Mainland, DO   650 mg at 07/11/15 2022  . aspirin EC tablet 81 mg  81 mg Oral Daily Tanna Savoy Stinson, DO   81 mg at 07/13/15 1157  . atorvastatin (LIPITOR) tablet 80 mg  80 mg Oral q1800 Samuella Cota, MD   80 mg at 07/13/15 1856  . clopidogrel (PLAVIX) tablet 75 mg  75 mg Oral Q breakfast Burnell Blanks, MD      . enalapril (VASOTEC) tablet 10 mg  10 mg Oral Daily Tanna Savoy Stinson, DO   10 mg at 07/13/15 1156  . metoprolol tartrate (LOPRESSOR) tablet 12.5 mg  12.5 mg Oral BID Samuella Cota, MD   12.5 mg at 07/13/15 2139  . nicotine (NICODERM CQ - dosed in mg/24 hours) patch 21 mg  21 mg Transdermal Daily Tanna Savoy Stinson, DO   21 mg at 07/13/15 1202  . nitroGLYCERIN (NITROSTAT) SL tablet 0.4 mg  0.4 mg Sublingual Q5 Min x 3 PRN Tanna Savoy Stinson, DO      . ondansetron [facility]) injection 4 mg  4 mg Intravenous Q6H PRN Tanna Savoy Stinson, DO      . pantoprazole (PROTONIX) EC tablet  40 mg  40 mg Oral Daily Tanna Savoy Stinson, DO   40 mg at 07/13/15 1157  . sodium chloride 0.9 % injection 3 mL  3 mL Intravenous Q12H Burnell Blanks, MD   3 mL at 07/13/15 2140  . sodium chloride 0.9 % injection 3 mL  3 mL Intravenous PRN Burnell Blanks, MD      . zolpidem (AMBIEN) tablet 5 mg  5 mg Oral QHS PRN,MR X 1 Tanna Savoy Stinson, DO   5 mg at 07/13/15 2141    PE: General appearance: alert, cooperative and no distress Neck: no carotid bruit and no JVD Lungs: clear to auscultation bilaterally Heart: regular rate and rhythm, S1, S2 normal, no murmur, click, rub or gallop Extremities: no LEE Pulses: 2+ and symmetric Skin: warm and dry Neurologic: Grossly normal  Lab Results:   Recent Labs  07/11/15 1324 07/13/15 0628 07/14/15 0433  WBC 4.8 5.9 6.8  HGB 15.7 15.6 15.6  HCT 46.1 46.2 46.1  PLT 178 192 195   BMET  Recent Labs  07/11/15 1324 07/13/15 0628 07/14/15 0433  NA 136 139 136  K 4.0 4.2 4.0  CL 105 104 104  CO2 24 29 24   GLUCOSE 93 103*  105*  BUN 19 18 22*  CREATININE 0.86 0.88 1.13  CALCIUM 8.7* 8.9 9.0   PT/INR  Recent Labs  07/12/15 1048  LABPROT 12.9  INR 0.95   Cholesterol  Recent Labs  07/12/15 0615  CHOL 209*   Cardiac Panel (last 3 results)  Recent Labs  07/11/15 1817 07/11/15 2358 07/12/15 0615  TROPONINI 0.09* 0.07* 0.05*    Studies/Results: LHC 07/13/15  Conclusion     Prox RCA lesion, 20% stenosed.  Dist Cx lesion, 70% stenosed.  Prox LAD-1 lesion, 20% stenosed.  Prox LAD-2 lesion, 20% stenosed.  Mid LAD lesion, 20% stenosed.  1st Diag lesion, 30% stenosed.  Mid Cx lesion, 99% stenosed. There is a 0% residual stenosis post intervention.  A drug-eluting stent was placed extending from the mid Circumfelx into the Obtuse marginal branch  The left ventricular systolic function is normal.  1. NSTEMI/Unstable angina secondary to severe stenosis mid Circumflex.  2. Successful PTCA/DES x 1 mid  Circumflex 3. Normal LV systolic function     Assessment/Plan  Principal Problem:   ACS (acute coronary syndrome) (HCC) Active Problems:   Chest pain   Essential hypertension   Hyperlipidemia   NSTEMI (non-ST elevated myocardial infarction) ([place])   1. NSTEMI: secondary to severe stenosis of mid Circumflex. S/p PTCA/DES x 1 to mid circ. LVEF normal. No recurrent CP. Ambulating w/o difficulty. NSR on telemetry. VSS. Renal function is stable. Right radial cath site is stable.  Continue DAPT for a minimum of 1 year. Continue BB, statin and ACE-I. Smoking cessation strongly advised.   2. HTN: controlled on current regimen.  3. HLD: LDL poorly controlled at 150 mg/dL. Ideal goal, given CAD and recent NSTEMI is <70 mg/DL. Continue high dose Lipitor. Recheck FLP and HFTs in 6 weeks.   4. Tobacco Abuse: smoking cessation strongly advised. Can continue Nicoderm patch.   Dispo: home today after MD sees. Will arrange post hospital f/u with APP in 2 weeks. F/u with Dr. Angelena Form in 6-8 weeks.     LOS: 2 days    Brittainy M. Rosita Fire, PA-C 07/14/2015 8:50 AM  As above, patient seen and examined. He denies chest pain or dyspnea. Right radial cath site with no hematoma. Plan to continue aspirin, Plavix and statin. Continue beta blocker and ACE inhibitor. Follow blood pressure at home and advance medications as needed. Patient counseled on discontinuing tobacco use. Follow-up in 2 weeks with Dr Bronson Ing. > 30 min PA and physician time D2 Kirk Ruths

## 2015-07-14 NOTE — Progress Notes (Signed)
  Echocardiogram 2D Echocardiogram has been performed.  Donata Clay 07/14/2015, 12:59 PM

## 2015-07-27 ENCOUNTER — Ambulatory Visit (INDEPENDENT_AMBULATORY_CARE_PROVIDER_SITE_OTHER): Payer: PRIVATE HEALTH INSURANCE | Admitting: Adult Health

## 2015-07-27 ENCOUNTER — Encounter: Payer: Self-pay | Admitting: Adult Health

## 2015-07-27 VITALS — BP 128/80 | HR 81 | Ht 68.0 in | Wt 166.4 lb

## 2015-07-27 DIAGNOSIS — E7800 Pure hypercholesterolemia, unspecified: Secondary | ICD-10-CM

## 2015-07-27 DIAGNOSIS — Z720 Tobacco use: Secondary | ICD-10-CM | POA: Insufficient documentation

## 2015-07-27 DIAGNOSIS — I241 Dressler's syndrome: Secondary | ICD-10-CM

## 2015-07-27 NOTE — Patient Instructions (Signed)
Your physician recommends that you schedule a follow-up appointment in: 1 month   Your physician recommends that you continue on your current medications as directed. Please refer to the Current Medication list given to you today.       You have been referred to cardiac rehab       Thank you for choosing [place] !

## 2015-07-27 NOTE — Progress Notes (Deleted)
Name: Anthony Hensley    DOB: 1961-06-09  Age: 54 y.o.  MR#: [id number]       PCP:  [name]      Insurance: Payor: GENERIC COMMERCIAL / Plan: GENERIC COMMERCIAL / Product Type: *No Product type* /   CC:    Chief Complaint  Patient presents with  . Coronary Artery Disease  . Nicotine Dependence    VS Filed Vitals:   07/27/15 1317  BP: 128/80  Pulse: 81  Height: 5\' 8"  (1.727 m)  Weight: 166 lb 6.4 oz (75.479 kg)  SpO2: 97%    Weights Current Weight  07/27/15 166 lb 6.4 oz (75.479 kg)  07/14/15 161 lb 2.5 oz (73.1 kg)  02/18/15 154 lb (69.854 kg)    Blood Pressure  BP Readings from Last 3 Encounters:  07/27/15 128/80  07/14/15 146/107  06/02/15 161/98     Admit date:  (Not on file) Last encounter with RMR:  Visit date not found   Allergy Review of patient's allergies indicates no known allergies.  Current Outpatient Prescriptions  Medication Sig Dispense Refill  . aspirin 81 MG EC tablet Take 1 tablet (81 mg total) by mouth daily. 30 tablet   . atorvastatin (LIPITOR) 80 MG tablet Take 1 tablet (80 mg total) by mouth daily at 6 PM. 30 tablet 5  . clopidogrel (PLAVIX) 75 MG tablet Take 1 tablet (75 mg total) by mouth daily with breakfast. 30 tablet 5  . enalapril (VASOTEC) 10 MG tablet Take 1 tablet (10 mg total) by mouth daily. 30 tablet 5  . Glycerin-Hypromellose-PEG 400 (VISINE TEARS OP) Apply 1 drop to eye daily.    . metoprolol tartrate (LOPRESSOR) 25 MG tablet Take 0.5 tablets (12.5 mg total) by mouth 2 (two) times daily. 60 tablet 5  . nicotine (NICODERM CQ - DOSED IN MG/24 HOURS) 21 mg/24hr patch Place 1 patch (21 mg total) onto the skin daily. 28 patch 0  . nitroGLYCERIN (NITROSTAT) 0.4 MG SL tablet Place 1 tablet (0.4 mg total) under the tongue every 5 (five) minutes x 3 doses as needed for chest pain. 25 tablet 2  . pantoprazole (PROTONIX) 40 MG tablet Take 1 tablet (40 mg total) by mouth daily. 30 tablet 5   No current facility-administered  medications for this visit.    Discontinued Meds:   There are no discontinued medications.  Patient Active Problem List   Diagnosis Date Noted  . Chest pain at rest   . NSTEMI (non-ST elevated myocardial infarction) ([place])   . ACS (acute coronary syndrome) ([place]) 07/12/2015  . Hyperlipidemia 07/12/2015  . Chest pain 07/11/2015  . Essential hypertension 07/11/2015  . LUMBAR SPRAIN AND STRAIN 06/08/2009    LABS    Component Value Date/Time   NA 136 07/14/2015 0433   NA 139 07/13/2015 0628   NA 136 07/11/2015 1324   K 4.0 07/14/2015 0433   K 4.2 07/13/2015 0628   K 4.0 07/11/2015 1324   CL 104 07/14/2015 0433   CL 104 07/13/2015 0628   CL 105 07/11/2015 1324   CO2 24 07/14/2015 0433   CO2 29 07/13/2015 0628   CO2 24 07/11/2015 1324   GLUCOSE 105* 07/14/2015 0433   GLUCOSE 103* 07/13/2015 0628   GLUCOSE 93 07/11/2015 1324   BUN 22* 07/14/2015 0433   BUN 18 07/13/2015 0628   BUN 19 07/11/2015 1324   CREATININE 1.13 07/14/2015 0433   CREATININE 0.88 07/13/2015 0628   CREATININE 0.86 07/11/2015 1324   CALCIUM  9.0 07/14/2015 0433   CALCIUM 8.9 07/13/2015 0628   CALCIUM 8.7* 07/11/2015 1324   GFRNONAA >60 07/14/2015 0433   GFRNONAA >60 07/13/2015 0628   GFRNONAA >60 07/11/2015 1324   GFRAA >60 07/14/2015 0433   GFRAA >60 07/13/2015 0628   GFRAA >60 07/11/2015 1324   CMP     Component Value Date/Time   NA 136 07/14/2015 0433   K 4.0 07/14/2015 0433   CL 104 07/14/2015 0433   CO2 24 07/14/2015 0433   GLUCOSE 105* 07/14/2015 0433   BUN 22* 07/14/2015 0433   CREATININE 1.13 07/14/2015 0433   CALCIUM 9.0 07/14/2015 0433   PROT 7.2 07/11/2015 1324   ALBUMIN 4.0 07/11/2015 1324   AST 21 07/11/2015 1324   ALT 27 07/11/2015 1324   ALKPHOS 74 07/11/2015 1324   BILITOT 0.3 07/11/2015 1324   GFRNONAA >60 07/14/2015 0433   GFRAA >60 07/14/2015 0433       Component Value Date/Time   WBC 6.8 07/14/2015 0433   WBC 5.9 07/13/2015 0628   WBC 4.8 07/11/2015 1324   HGB  15.6 07/14/2015 0433   HGB 15.6 07/13/2015 0628   HGB 15.7 07/11/2015 1324   HCT 46.1 07/14/2015 0433   HCT 46.2 07/13/2015 0628   HCT 46.1 07/11/2015 1324   MCV 92.0 07/14/2015 0433   MCV 92.8 07/13/2015 0628   MCV 91.5 07/11/2015 1324    Lipid Panel     Component Value Date/Time   CHOL 209* 07/12/2015 0615   TRIG 126 07/12/2015 0615   HDL 34* 07/12/2015 0615   CHOLHDL 6.1 07/12/2015 0615   VLDL 25 07/12/2015 0615   LDLCALC 150* 07/12/2015 0615    ABG No results found for: PHART, PCO2ART, PO2ART, HCO3, TCO2, ACIDBASEDEF, O2SAT   No results found for: TSH BNP (last 3 results) No results for input(s): BNP in the last 8760 hours.  ProBNP (last 3 results) No results for input(s): PROBNP in the last 8760 hours.  Cardiac Panel (last 3 results) No results for input(s): CKTOTAL, CKMB, TROPONINI, RELINDX in the last 72 hours.  Iron/TIBC/Ferritin/ %Sat No results found for: IRON, TIBC, FERRITIN, IRONPCTSAT   EKG Orders placed or performed during the hospital encounter of 07/11/15  . EKG 12-Lead  . EKG 12-Lead  . EKG 12-Lead  . EKG  . EKG 12-Lead immediately post procedure  . EKG 12-Lead  . EKG 12-Lead immediately post procedure  . EKG 12-Lead  . EKG 12-Lead  . EKG 12-Lead  . EKG 12-Lead  . EKG 12-Lead  . EKG     Prior Assessment and Plan Problem List as of 07/27/2015      Cardiovascular and Mediastinum   Essential hypertension   ACS (acute coronary syndrome) ([facility])   NSTEMI (non-ST elevated myocardial infarction) ([place])     Musculoskeletal and Integument   LUMBAR SPRAIN AND STRAIN     Other   Chest pain   Hyperlipidemia   Chest pain at rest       Imaging: Dg Chest Portable 1 View  07/11/2015  CLINICAL DATA:  Acute shortness of breath, chest pain and hypertension EXAM: PORTABLE CHEST 1 VIEW COMPARISON:  02/18/2015 FINDINGS: The heart size and mediastinal contours are within normal limits. Both lungs are clear. The visualized skeletal structures are  unremarkable. Monitor leads overlie the chest. IMPRESSION: No active disease. Electronically Signed   By: Jerilynn Mages.  Shick M.D.   On: 07/11/2015 13:56

## 2015-07-27 NOTE — Progress Notes (Signed)
Cardiology Office Note   Date:  07/27/2015   ID:  Anthony Hensley, DOB [birth year], MRN [id number]  PCP:  [name]  Cardiologist: Woodroe Chen, NP   Chief Complaint  Patient presents with  . Coronary Artery Disease  . Nicotine Dependence      History of Present Illness: Anthony Hensley is a 54 y.o. male who presents for ongoing assessment and management of CAD, s/p admission for acute chest pain requiring cardiac catheterization on 07/11/2015. He was found to have severe, 99% stenosis of the mid circumflex. He underwent successful PCI + DES to the mid circ. LVEF was normal at 65%. There was mild nonobstructive residual disease.He had no recurrent CP and no post cath complications. He was placed on DAPT with ASA + Plavix and high dose Lipitor (LDL was 150 mg/dL). He was also placed on a BB and ACE-I.Smoking cessation was strongly advised.   He comes today stating that he feels better but has been tired today. He thinks he may have over done it at a homecoming at church the day before. He has had some sharp right sided chest pain yesterday. No dyspnea or heartburn symptoms as he had prior to his NSTEMI.   I have answered numerous questions about his stent placement, and showed him the diagram of the cardiac cath report. I have printed a copy for him. He unfortunately continues to smoke. I have spent greater than 25 minutes answering questions and educating patient on his risk factors.     Past Medical History  Diagnosis Date  . Hypertension   . Hypercholesterolemia     Past Surgical History  Procedure Laterality Date  . Cardiac catheterization N/A 07/13/2015    Procedure: Left Heart Cath and Coronary Angiography;  Surgeon: Burnell Blanks, MD;  Location: [place] [facility];  Service: Cardiovascular;  Laterality: N/A;  . Cardiac catheterization N/A 07/13/2015    Procedure: Coronary Stent Intervention;  Surgeon: Burnell Blanks, MD;  Location: [place] [facility];  Service: Cardiovascular;  Laterality: N/A;     Current Outpatient Prescriptions  Medication Sig Dispense Refill  . aspirin 81 MG EC tablet Take 1 tablet (81 mg total) by mouth daily. 30 tablet   . atorvastatin (LIPITOR) 80 MG tablet Take 1 tablet (80 mg total) by mouth daily at 6 PM. 30 tablet 5  . clopidogrel (PLAVIX) 75 MG tablet Take 1 tablet (75 mg total) by mouth daily with breakfast. 30 tablet 5  . enalapril (VASOTEC) 10 MG tablet Take 1 tablet (10 mg total) by mouth daily. 30 tablet 5  . Glycerin-Hypromellose-PEG 400 (VISINE TEARS OP) Apply 1 drop to eye daily.    . metoprolol tartrate (LOPRESSOR) 25 MG tablet Take 0.5 tablets (12.5 mg total) by mouth 2 (two) times daily. 60 tablet 5  . nicotine (NICODERM CQ - DOSED IN MG/24 HOURS) 21 mg/24hr patch Place 1 patch (21 mg total) onto the skin daily. 28 patch 0  . nitroGLYCERIN (NITROSTAT) 0.4 MG SL tablet Place 1 tablet (0.4 mg total) under the tongue every 5 (five) minutes x 3 doses as needed for chest pain. 25 tablet 2  . pantoprazole (PROTONIX) 40 MG tablet Take 1 tablet (40 mg total) by mouth daily. 30 tablet 5   No current facility-administered medications for this visit.    Allergies:   Review of patient's allergies indicates no known allergies.    Social History:  The patient  reports that he has been smoking Cigarettes.  He has a 26.25 pack-year smoking history. He has never used smokeless tobacco. He reports that he does not drink alcohol or use illicit drugs.   Family History:  The patient's family history includes Heart attack (age of onset: 57) in his father.    ROS: All other systems are reviewed and negative. Unless otherwise mentioned in H&P    PHYSICAL EXAM: VS:  BP 128/80 mmHg  Pulse 81  Ht 5\' 8"  (1.727 m)  Wt 166 lb 6.4 oz (75.479 kg)  BMI 25.31 kg/m2  SpO2 97% , BMI Body mass index is 25.31 kg/(m^2). GEN: Well nourished, well developed, in no acute distress HEENT: normal Neck: no JVD,  carotid bruits, or masses Cardiac: RRR; no murmurs, rubs, or gallops,no edema  Respiratory:  Clear to auscultation bilaterally, normal work of breathing GI: soft, nontender, nondistended, + BS MS: no deformity or atrophy Skin: warm and dry, no rash Neuro:  Strength and sensation are intact Psych: euthymic mood, full affect  Recent Labs: 07/11/2015: ALT 27 07/14/2015: BUN 22*; Creatinine, Ser 1.13; Hemoglobin 15.6; Platelets 195; Potassium 4.0; Sodium 136    Lipid Panel    Component Value Date/Time   CHOL 209* 07/12/2015 0615   TRIG 126 07/12/2015 0615   HDL 34* 07/12/2015 0615   CHOLHDL 6.1 07/12/2015 0615   VLDL 25 07/12/2015 0615   LDLCALC 150* 07/12/2015 0615      Wt Readings from Last 3 Encounters:  07/27/15 166 lb 6.4 oz (75.479 kg)  07/14/15 161 lb 2.5 oz (73.1 kg)  02/18/15 154 lb (69.854 kg)      ASSESSMENT AND PLAN:  1. CAD: S/P NSTEMI: PTCA and DES to mid circumflex. He has residual 70% stenosis in the very distal segment, and less than 30% stenosis elsewhere. He is on DAPT with Plavix and ASA, statin therapy and BB. He has some complaints of fatigue. He is to continue current management. He is to be referred to cardiac rehab. He is not to lift over 20 lbs for another 2 weeks. He is given an excuse for work. He is to continue walking regimen.   2. Ongoing tobacco abuse:  I have counseled him for > 5 minutes on smoking cessation and risk for another cardiac event should he continue this habit. He is on nicoderm patches. He is encourage to quit. He verbalizes understanding.   3. Hypercholesterolemia: Continue statin therapy.   Current medicines are reviewed at length with the patient today.    Labs/ tests ordered today include: None  Orders Placed This Encounter  Procedures  . AMB referral to cardiac rehabilitation     Disposition:   FU with one month.  Signed, Jory Sims, NP  07/27/2015 2:24 PM    [facility] [address], [place], [place] [address] Phone: [phone]; Fax: [phone]

## 2015-07-29 ENCOUNTER — Encounter: Payer: PRIVATE HEALTH INSURANCE | Admitting: Cardiovascular Disease

## 2015-07-31 ENCOUNTER — Encounter: Payer: PRIVATE HEALTH INSURANCE | Admitting: Cardiovascular Disease

## 2015-08-24 ENCOUNTER — Telehealth: Payer: Self-pay | Admitting: Adult Health

## 2015-08-24 NOTE — Telephone Encounter (Signed)
Will forward to K Lawrence NP 

## 2015-08-24 NOTE — Telephone Encounter (Signed)
Called patient. No answer. Left message to call back.  

## 2015-08-24 NOTE — Telephone Encounter (Signed)
Pt is having some teeth pulled on Friday and he's on blood thinner and would like to know when he needs to stop taking it

## 2015-08-24 NOTE — Telephone Encounter (Signed)
He cannot stop Plavix for a minimum of one year unless this is emergent tooth extraction. Elective procedures need to wait.  He had PCI done in October 2016 and will need to be on uninterrupted antiplatelet therapy until Oct of 2017, unless emergent procedure is needs.

## 2015-08-25 NOTE — Telephone Encounter (Signed)
Pt made aware that he cannot stop his Plavix for teeth extraction on Friday. He said that he needs to discuss it further with the dr. @ his appointment on Thursday.

## 2015-08-27 ENCOUNTER — Encounter: Payer: Self-pay | Admitting: Adult Health

## 2015-08-27 ENCOUNTER — Ambulatory Visit (INDEPENDENT_AMBULATORY_CARE_PROVIDER_SITE_OTHER): Payer: No Typology Code available for payment source | Admitting: Adult Health

## 2015-08-27 ENCOUNTER — Encounter: Payer: Self-pay | Admitting: *Deleted

## 2015-08-27 VITALS — BP 126/88 | HR 76 | Ht 66.0 in | Wt 169.0 lb

## 2015-08-27 DIAGNOSIS — I10 Essential (primary) hypertension: Secondary | ICD-10-CM

## 2015-08-27 DIAGNOSIS — I2510 Atherosclerotic heart disease of native coronary artery without angina pectoris: Secondary | ICD-10-CM

## 2015-08-27 DIAGNOSIS — Z720 Tobacco use: Secondary | ICD-10-CM

## 2015-08-27 NOTE — Patient Instructions (Signed)
Your physician wants you to follow-up in: 6 Months.  You will receive a reminder letter in the mail two months in advance. If you don't receive a letter, please call our office to schedule the follow-up appointment.  Your physician recommends that you continue on your current medications as directed. Please refer to the Current Medication list given to you today.  If you need a refill on your cardiac medications before your next appointment, please call your pharmacy.  Thank you for choosing [facility]!   

## 2015-08-27 NOTE — Progress Notes (Signed)
Cardiology Office Note   Date:  08/27/2015   ID:  Anthony Hensley, DOB [birth year], MRN [id number]  PCP:  [name]  Cardiologist: Woodroe Chen, NP   No chief complaint on file.     History of Present Illness: Anthony Hensley is a 54 y.o. male who presents for for ongoing assessment and management of CAD, s/p admission for acute chest pain requiring cardiac catheterization on 07/11/2015. He was found to have severe, 99% stenosis of the mid circumflex. He underwent successful PCI + DES to the mid circ. LVEF was normal at 65%. There was mild nonobstructive residual disease.He had no recurrent CP and no post cath complications. He was placed on DAPT with ASA + Plavix and high dose Lipitor (LDL was 150 mg/dL). He was also placed on a BB and ACE-I.Smoking cessation was strongly advised.   He comes today without cardiac complaints. Is medically compliant. Continues to smoke however.   Past Medical History  Diagnosis Date  . Hypertension   . Hypercholesterolemia     Past Surgical History  Procedure Laterality Date  . Cardiac catheterization N/A 07/13/2015    Procedure: Left Heart Cath and Coronary Angiography;  Surgeon: Burnell Blanks, MD;  Location: [place] [facility];  Service: Cardiovascular;  Laterality: N/A;  . Cardiac catheterization N/A 07/13/2015    Procedure: Coronary Stent Intervention;  Surgeon: Burnell Blanks, MD;  Location: [place] [facility];  Service: Cardiovascular;  Laterality: N/A;     Current Outpatient Prescriptions  Medication Sig Dispense Refill  . aspirin 81 MG EC tablet Take 1 tablet (81 mg total) by mouth daily. 30 tablet   . atorvastatin (LIPITOR) 80 MG tablet Take 1 tablet (80 mg total) by mouth daily at 6 PM. 30 tablet 5  . clopidogrel (PLAVIX) 75 MG tablet Take 1 tablet (75 mg total) by mouth daily with breakfast. 30 tablet 5  . enalapril (VASOTEC) 10 MG tablet Take 1 tablet (10 mg total) by mouth daily. 30 tablet 5  .  Glycerin-Hypromellose-PEG 400 (VISINE TEARS OP) Apply 1 drop to eye daily.    . metoprolol tartrate (LOPRESSOR) 25 MG tablet Take 0.5 tablets (12.5 mg total) by mouth 2 (two) times daily. 60 tablet 5  . nicotine (NICODERM CQ - DOSED IN MG/24 HOURS) 21 mg/24hr patch Place 1 patch (21 mg total) onto the skin daily. 28 patch 0  . nitroGLYCERIN (NITROSTAT) 0.4 MG SL tablet Place 1 tablet (0.4 mg total) under the tongue every 5 (five) minutes x 3 doses as needed for chest pain. 25 tablet 2  . pantoprazole (PROTONIX) 40 MG tablet Take 1 tablet (40 mg total) by mouth daily. 30 tablet 5   No current facility-administered medications for this visit.    Allergies:   Review of patient's allergies indicates no known allergies.    Social History:  The patient  reports that he has been smoking Cigarettes.  He has a 26.25 pack-year smoking history. He has never used smokeless tobacco. He reports that he does not drink alcohol or use illicit drugs.   Family History:  The patient's family history includes Heart attack (age of onset: 62) in his father.    ROS: All other systems are reviewed and negative. Unless otherwise mentioned in H&P    PHYSICAL EXAM: VS:  There were no vitals taken for this visit. , BMI There is no weight on file to calculate BMI. GEN: Well nourished, well developed, in no acute distress HEENT: normal Neck: no  JVD, carotid bruits, or masses Cardiac: RRR; no murmurs, rubs, or gallops,no edema  Respiratory:  clear to auscultation bilaterally, normal work of breathing GI: soft, nontender, nondistended, + BS MS: no deformity or atrophy Skin: warm and dry, no rash Neuro:  Strength and sensation are intact Psych: euthymic mood, full affect    Recent Labs: 07/11/2015: ALT 27 07/14/2015: BUN 22*; Creatinine, Ser 1.13; Hemoglobin 15.6; Platelets 195; Potassium 4.0; Sodium 136    Lipid Panel    Component Value Date/Time   CHOL 209* 07/12/2015 0615   TRIG 126 07/12/2015 0615   HDL  34* 07/12/2015 0615   CHOLHDL 6.1 07/12/2015 0615   VLDL 25 07/12/2015 0615   LDLCALC 150* 07/12/2015 0615      Wt Readings from Last 3 Encounters:  07/27/15 166 lb 6.4 oz (75.479 kg)  07/14/15 161 lb 2.5 oz (73.1 kg)  02/18/15 154 lb (69.854 kg)      ASSESSMENT AND PLAN:  1.  CAD: No complaints of angina or need to take NTG. He is doing well. I have again stress the need to stop smoking. He will continue BB, ASA, Plavix and statin.   2. Hypertension: BP is well controlled. Continue ACE.   3. Ongoing tobacco abuse: Smoking cessation is again recommended. I have spent > 5 minutes on counseling.    Current medicines are reviewed at length with the patient today.    Labs/ tests ordered today include:No orders of the defined types were placed in this encounter.     Disposition:   FU with 6 months.    Signed, Jory Sims, NP  08/27/2015 8:35 AM    [facility] [address] [place], [place] [address] Phone: [phone]; Fax: [phone]

## 2015-08-27 NOTE — Progress Notes (Deleted)
Name: Anthony Hensley    DOB: May 25, 1961  Age: 54 y.o.  MR#: [id number]       PCP:  [name]      Insurance: Payor: [employer] MULTIPLAN / Plan: MULTIPLAN PHCS / Product Type: *No Product type* /   CC:   No chief complaint on file.   VS Filed Vitals:   08/27/15 1251  BP: 126/88  Pulse: 76  Height: 5\' 6"  (1.676 m)  Weight: 169 lb (76.658 kg)  SpO2: 95%    Weights Current Weight  08/27/15 169 lb (76.658 kg)  07/27/15 166 lb 6.4 oz (75.479 kg)  07/14/15 161 lb 2.5 oz (73.1 kg)    Blood Pressure  BP Readings from Last 3 Encounters:  08/27/15 126/88  07/27/15 128/80  07/14/15 146/107     Admit date:  (Not on file) Last encounter with RMR:  08/24/2015   Allergy Review of patient's allergies indicates no known allergies.  Current Outpatient Prescriptions  Medication Sig Dispense Refill  . aspirin 81 MG EC tablet Take 1 tablet (81 mg total) by mouth daily. 30 tablet   . atorvastatin (LIPITOR) 80 MG tablet Take 1 tablet (80 mg total) by mouth daily at 6 PM. 30 tablet 5  . clopidogrel (PLAVIX) 75 MG tablet Take 1 tablet (75 mg total) by mouth daily with breakfast. 30 tablet 5  . enalapril (VASOTEC) 10 MG tablet Take 1 tablet (10 mg total) by mouth daily. 30 tablet 5  . Glycerin-Hypromellose-PEG 400 (VISINE TEARS OP) Apply 1 drop to eye daily.    . metoprolol tartrate (LOPRESSOR) 25 MG tablet Take 0.5 tablets (12.5 mg total) by mouth 2 (two) times daily. 60 tablet 5  . nicotine (NICODERM CQ - DOSED IN MG/24 HOURS) 21 mg/24hr patch Place 1 patch (21 mg total) onto the skin daily. 28 patch 0  . nitroGLYCERIN (NITROSTAT) 0.4 MG SL tablet Place 1 tablet (0.4 mg total) under the tongue every 5 (five) minutes x 3 doses as needed for chest pain. 25 tablet 2  . pantoprazole (PROTONIX) 40 MG tablet Take 1 tablet (40 mg total) by mouth daily. 30 tablet 5   No current facility-administered medications for this visit.    Discontinued Meds:   There are no discontinued  medications.  Patient Active Problem List   Diagnosis Date Noted  . Tobacco abuse 07/27/2015  . Chest pain at rest   . NSTEMI (non-ST elevated myocardial infarction) ([place])   . ACS (acute coronary syndrome) ([place]) 07/12/2015  . Hyperlipidemia 07/12/2015  . Chest pain 07/11/2015  . Essential hypertension 07/11/2015  . LUMBAR SPRAIN AND STRAIN 06/08/2009    LABS    Component Value Date/Time   NA 136 07/14/2015 0433   NA 139 07/13/2015 0628   NA 136 07/11/2015 1324   K 4.0 07/14/2015 0433   K 4.2 07/13/2015 0628   K 4.0 07/11/2015 1324   CL 104 07/14/2015 0433   CL 104 07/13/2015 0628   CL 105 07/11/2015 1324   CO2 24 07/14/2015 0433   CO2 29 07/13/2015 0628   CO2 24 07/11/2015 1324   GLUCOSE 105* 07/14/2015 0433   GLUCOSE 103* 07/13/2015 0628   GLUCOSE 93 07/11/2015 1324   BUN 22* 07/14/2015 0433   BUN 18 07/13/2015 0628   BUN 19 07/11/2015 1324   CREATININE 1.13 07/14/2015 0433   CREATININE 0.88 07/13/2015 0628   CREATININE 0.86 07/11/2015 1324   CALCIUM 9.0 07/14/2015 0433   CALCIUM 8.9 07/13/2015 0628   CALCIUM  8.7* 07/11/2015 1324   GFRNONAA >60 07/14/2015 0433   GFRNONAA >60 07/13/2015 0628   GFRNONAA >60 07/11/2015 1324   GFRAA >60 07/14/2015 0433   GFRAA >60 07/13/2015 0628   GFRAA >60 07/11/2015 1324   CMP     Component Value Date/Time   NA 136 07/14/2015 0433   K 4.0 07/14/2015 0433   CL 104 07/14/2015 0433   CO2 24 07/14/2015 0433   GLUCOSE 105* 07/14/2015 0433   BUN 22* 07/14/2015 0433   CREATININE 1.13 07/14/2015 0433   CALCIUM 9.0 07/14/2015 0433   PROT 7.2 07/11/2015 1324   ALBUMIN 4.0 07/11/2015 1324   AST 21 07/11/2015 1324   ALT 27 07/11/2015 1324   ALKPHOS 74 07/11/2015 1324   BILITOT 0.3 07/11/2015 1324   GFRNONAA >60 07/14/2015 0433   GFRAA >60 07/14/2015 0433       Component Value Date/Time   WBC 6.8 07/14/2015 0433   WBC 5.9 07/13/2015 0628   WBC 4.8 07/11/2015 1324   HGB 15.6 07/14/2015 0433   HGB 15.6 07/13/2015 0628    HGB 15.7 07/11/2015 1324   HCT 46.1 07/14/2015 0433   HCT 46.2 07/13/2015 0628   HCT 46.1 07/11/2015 1324   MCV 92.0 07/14/2015 0433   MCV 92.8 07/13/2015 0628   MCV 91.5 07/11/2015 1324    Lipid Panel     Component Value Date/Time   CHOL 209* 07/12/2015 0615   TRIG 126 07/12/2015 0615   HDL 34* 07/12/2015 0615   CHOLHDL 6.1 07/12/2015 0615   VLDL 25 07/12/2015 0615   LDLCALC 150* 07/12/2015 0615    ABG No results found for: PHART, PCO2ART, PO2ART, HCO3, TCO2, ACIDBASEDEF, O2SAT   No results found for: TSH BNP (last 3 results) No results for input(s): BNP in the last 8760 hours.  ProBNP (last 3 results) No results for input(s): PROBNP in the last 8760 hours.  Cardiac Panel (last 3 results) No results for input(s): CKTOTAL, CKMB, TROPONINI, RELINDX in the last 72 hours.  Iron/TIBC/Ferritin/ %Sat No results found for: IRON, TIBC, FERRITIN, IRONPCTSAT   EKG Orders placed or performed during the hospital encounter of 07/11/15  . EKG 12-Lead  . EKG 12-Lead  . EKG 12-Lead  . EKG  . EKG 12-Lead immediately post procedure  . EKG 12-Lead  . EKG 12-Lead immediately post procedure  . EKG 12-Lead  . EKG 12-Lead  . EKG 12-Lead  . EKG 12-Lead  . EKG 12-Lead  . EKG     Prior Assessment and Plan Problem List as of 08/27/2015      Cardiovascular and Mediastinum   Essential hypertension   ACS (acute coronary syndrome) ([facility])   NSTEMI (non-ST elevated myocardial infarction) ([place])     Musculoskeletal and Integument   LUMBAR SPRAIN AND STRAIN     Other   Chest pain   Hyperlipidemia   Chest pain at rest   Tobacco abuse       Imaging: No results found.

## 2015-10-11 DIAGNOSIS — I219 Acute myocardial infarction, unspecified: Secondary | ICD-10-CM

## 2015-10-11 HISTORY — DX: Acute myocardial infarction, unspecified: I21.9

## 2015-11-09 ENCOUNTER — Emergency Department (HOSPITAL_COMMUNITY)
Admission: EM | Admit: 2015-11-09 | Discharge: 2015-11-10 | Disposition: A | Discharge status: Home / Self Care | Payer: BLUE CROSS/BLUE SHIELD | Attending: Emergency Medicine | Admitting: Emergency Medicine

## 2015-11-09 ENCOUNTER — Encounter (HOSPITAL_COMMUNITY): Payer: Self-pay | Admitting: Emergency Medicine

## 2015-11-09 ENCOUNTER — Emergency Department (HOSPITAL_COMMUNITY): Payer: BLUE CROSS/BLUE SHIELD

## 2015-11-09 DIAGNOSIS — F17210 Nicotine dependence, cigarettes, uncomplicated: Secondary | ICD-10-CM | POA: Diagnosis not present

## 2015-11-09 DIAGNOSIS — R1110 Vomiting, unspecified: Secondary | ICD-10-CM | POA: Insufficient documentation

## 2015-11-09 DIAGNOSIS — I10 Essential (primary) hypertension: Secondary | ICD-10-CM | POA: Insufficient documentation

## 2015-11-09 DIAGNOSIS — R197 Diarrhea, unspecified: Secondary | ICD-10-CM | POA: Diagnosis not present

## 2015-11-09 DIAGNOSIS — E7800 Pure hypercholesterolemia, unspecified: Secondary | ICD-10-CM | POA: Insufficient documentation

## 2015-11-09 DIAGNOSIS — Z7902 Long term (current) use of antithrombotics/antiplatelets: Secondary | ICD-10-CM | POA: Diagnosis not present

## 2015-11-09 DIAGNOSIS — Z9889 Other specified postprocedural states: Secondary | ICD-10-CM | POA: Insufficient documentation

## 2015-11-09 DIAGNOSIS — R1033 Periumbilical pain: Secondary | ICD-10-CM | POA: Diagnosis not present

## 2015-11-09 DIAGNOSIS — R109 Unspecified abdominal pain: Secondary | ICD-10-CM

## 2015-11-09 DIAGNOSIS — Z7982 Long term (current) use of aspirin: Secondary | ICD-10-CM | POA: Diagnosis not present

## 2015-11-09 LAB — URINALYSIS, ROUTINE W REFLEX MICROSCOPIC
Bilirubin Urine: NEGATIVE
GLUCOSE, UA: NEGATIVE mg/dL
KETONES UR: NEGATIVE mg/dL
LEUKOCYTES UA: NEGATIVE
NITRITE: NEGATIVE
PROTEIN: NEGATIVE mg/dL
Specific Gravity, Urine: 1.025 (ref 1.005–1.030)
pH: 5.5 (ref 5.0–8.0)

## 2015-11-09 LAB — COMPREHENSIVE METABOLIC PANEL
ALT: 31 U/L (ref 17–63)
AST: 28 U/L (ref 15–41)
Albumin: 4.2 g/dL (ref 3.5–5.0)
Alkaline Phosphatase: 80 U/L (ref 38–126)
Anion gap: 8 (ref 5–15)
BILIRUBIN TOTAL: 0.5 mg/dL (ref 0.3–1.2)
BUN: 19 mg/dL (ref 6–20)
CALCIUM: 9.4 mg/dL (ref 8.9–10.3)
CO2: 27 mmol/L (ref 22–32)
CREATININE: 0.92 mg/dL (ref 0.61–1.24)
Chloride: 102 mmol/L (ref 101–111)
GFR calc Af Amer: >60 mL/min (ref >60)
Glucose, Bld: 88 mg/dL (ref 65–99)
Potassium: 3.7 mmol/L (ref 3.5–5.1)
Sodium: 137 mmol/L (ref 135–145)
TOTAL PROTEIN: 7.3 g/dL (ref 6.5–8.1)

## 2015-11-09 LAB — CBC
HCT: 46.9 % (ref 39.0–52.0)
Hemoglobin: 15.8 g/dL (ref 13.0–17.0)
MCH: 30.9 pg (ref 26.0–34.0)
MCHC: 33.7 g/dL (ref 30.0–36.0)
MCV: 91.6 fL (ref 78.0–100.0)
PLATELETS: 205 10*3/uL (ref 150–400)
RBC: 5.12 MIL/uL (ref 4.22–5.81)
RDW: 12.6 % (ref 11.5–15.5)
WBC: 6 10*3/uL (ref 4.0–10.5)

## 2015-11-09 LAB — URINE MICROSCOPIC-ADD ON: Squamous Epithelial / LPF: NONE SEEN

## 2015-11-09 LAB — LIPASE, BLOOD: Lipase: 20 U/L (ref 11–51)

## 2015-11-09 MED ORDER — ONDANSETRON HCL 4 MG/2ML IJ SOLN
4.0000 mg | Freq: Once | INTRAMUSCULAR | Status: AC
  Administered 2015-11-09: 4 mg via INTRAVENOUS
  Filled 2015-11-09: qty 2

## 2015-11-09 MED ORDER — DIATRIZOATE MEGLUMINE & SODIUM 66-10 % PO SOLN
ORAL | Status: AC
  Filled 2015-11-09: qty 30

## 2015-11-09 MED ORDER — IOHEXOL 300 MG/ML  SOLN
100.0000 mL | Freq: Once | INTRAMUSCULAR | Status: AC | PRN
  Administered 2015-11-09: 100 mL via INTRAVENOUS

## 2015-11-09 MED ORDER — MORPHINE SULFATE (PF) 4 MG/ML IV SOLN
4.0000 mg | Freq: Once | INTRAVENOUS | Status: AC
  Administered 2015-11-09: 4 mg via INTRAVENOUS
  Filled 2015-11-09: qty 1

## 2015-11-09 NOTE — ED Provider Notes (Signed)
CSN: [id number]     Arrival date & time 11/09/15  1825 History  By signing my name below, I, Doran Stabler, attest that this documentation has been prepared under the direction and in the presence of Ezequiel Essex, MD. Electronically Signed: Doran Stabler, ED Scribe. 11/09/2015. 10:05 PM.    Chief Complaint  Patient presents with  . Abdominal Pain   The history is provided by the patient. No language interpreter was used.   HPI Comments: Anthony Hensley is a 55 y.o. male with a h/o HTD, HLD, and cardiac stents who presents to the Emergency Department complaining of intermittent abdominal pain that began 1 month ago but has been constant and worse for the past 3 days. Pt reports worsening pain with eating. Pt denies any alleviating factors.Pt  also reports vomiting x1 and diarrhea x8/day for the past 3 days.  Pt denies any fevers, chills, dizziness, lightheadedness, blood in the stool or urine, testicular pain, dysuria, back pain, chest pain, SOB,  or any other sx at this time. Pt denies any recent travels or sick contacts. Pt denies being on any abx. Pt denies h/o abdominal surgeries. No known drug allergies.   Pt is followed by Dr. Glennon Mac.   Past Medical History  Diagnosis Date  . Hypertension   . Hypercholesterolemia    Past Surgical History  Procedure Laterality Date  . Cardiac catheterization N/A 07/13/2015    Procedure: Left Heart Cath and Coronary Angiography;  Surgeon: Burnell Blanks, MD;  Location: [facility];  Service: Cardiovascular;  Laterality: N/A;  . Cardiac catheterization N/A 07/13/2015    Procedure: Coronary Stent Intervention;  Surgeon: Burnell Blanks, MD;  Location: [facility];  Service: Cardiovascular;  Laterality: N/A;   Family History  Problem Relation Age of Onset  . Heart attack Father 48    Deceased   Social History  Substance Use Topics  . Smoking status: Current Some Day Smoker -- 0.75 packs/day for 35 years    Types:  Cigarettes  . Smokeless tobacco: Never Used  . Alcohol Use: No    Review of Systems A complete 10 system review of systems was obtained and all systems are negative except as noted in the HPI and PMH.    Allergies  Review of patient's allergies indicates no known allergies.  Home Medications   Prior to Admission medications   Medication Sig Start Date End Date Taking? Authorizing Provider  aspirin 81 MG EC tablet Take 1 tablet (81 mg total) by mouth daily. 07/14/15  Yes [name], PA-C  atorvastatin (LIPITOR) 80 MG tablet Take 1 tablet (80 mg total) by mouth daily at 6 PM. 07/14/15  Yes [name], PA-C  clopidogrel (PLAVIX) 75 MG tablet Take 1 tablet (75 mg total) by mouth daily with breakfast. 07/14/15  Yes Brittainy M Simmons, PA-C  enalapril (VASOTEC) 10 MG tablet Take 1 tablet (10 mg total) by mouth daily. 07/14/15  Yes [name], PA-C  Glycerin-Hypromellose-PEG 400 (VISINE TEARS OP) Apply 1 drop to eye daily.   Yes Historical Provider, MD  metoprolol tartrate (LOPRESSOR) 25 MG tablet Take 0.5 tablets (12.5 mg total) by mouth 2 (two) times daily. 07/14/15  Yes [name], PA-C  nitroGLYCERIN (NITROSTAT) 0.4 MG SL tablet Place 1 tablet (0.4 mg total) under the tongue every 5 (five) minutes x 3 doses as needed for chest pain. 07/14/15  Yes [name], PA-C  pantoprazole (PROTONIX) 40 MG tablet Take 1 tablet (  40 mg total) by mouth daily. 07/14/15  Yes [name], PA-C  nicotine (NICODERM CQ - DOSED IN MG/24 HOURS) 21 mg/24hr patch Place 1 patch (21 mg total) onto the skin daily. Patient not taking: Reported on 11/09/2015 07/14/15   Brittainy M Rosita Fire, PA-C  ondansetron (ZOFRAN) 4 MG tablet Take 1 tablet (4 mg total) by mouth every 6 (six) hours. 11/10/15   Ezequiel Essex, MD   BP 136/78 mmHg  Pulse 70  Temp(Src) 98.9 F (37.2 C) (Tympanic)  Resp 14  Ht 5\' 9"  (1.753 m)  Wt 160 lb (72.576 kg)  BMI 23.62 kg/m2  SpO2 95% Physical Exam   Constitutional: He is oriented to person, place, and time. He appears well-developed and well-nourished. No distress.  HENT:  Head: Normocephalic and atraumatic.  Mouth/Throat: Oropharynx is clear and moist. No oropharyngeal exudate.  Eyes: Conjunctivae and EOM are normal. Pupils are equal, round, and reactive to light.  Neck: Normal range of motion. Neck supple.  No meningismus.  Cardiovascular: Normal rate, regular rhythm, normal heart sounds and intact distal pulses.   No murmur heard. Pulmonary/Chest: Effort normal and breath sounds normal. No respiratory distress.  Abdominal: Soft. There is no tenderness. There is no rebound and no guarding.  periumbilical tenderness, no RLQ tenderness, no appreciable hernia  Genitourinary:   no testicular tenderness  Musculoskeletal: Normal range of motion. He exhibits no edema or tenderness.  Neurological: He is alert and oriented to person, place, and time. No cranial nerve deficit. He exhibits normal muscle tone. Coordination normal.  No ataxia on finger to nose bilaterally. No pronator drift. 5/5 strength throughout. CN 2-12 intact.Equal grip strength. Sensation intact.   Skin: Skin is warm.  Psychiatric: He has a normal mood and affect. His behavior is normal.  Nursing note and vitals reviewed.   ED Course  Procedures  DIAGNOSTIC STUDIES: Oxygen Saturation is 99% on room air, normal by my interpretation.    COORDINATION OF CARE: 10:02 PM  Will order blood work, urinalysis, CT abdomen. Will give Morphine and Zofran. Discussed treatment plan with pt at bedside and pt agreed to plan.   Labs Review Labs Reviewed  URINALYSIS, ROUTINE W REFLEX MICROSCOPIC (NOT AT [facility]) - Abnormal; Notable for the following:    Hgb urine dipstick TRACE (*)    All other components within normal limits  URINE MICROSCOPIC-ADD ON - Abnormal; Notable for the following:    Bacteria, UA RARE (*)    All other components within normal limits  CBC  LIPASE, BLOOD   COMPREHENSIVE METABOLIC PANEL    Imaging Review Ct Abdomen Pelvis W Contrast  11/09/2015  CLINICAL DATA:  Periumbilical pain beginning 1 month ago when worsening over 3 days. EXAM: CT ABDOMEN AND PELVIS WITH CONTRAST TECHNIQUE: Multidetector CT imaging of the abdomen and pelvis was performed using the standard protocol following bolus administration of intravenous contrast. CONTRAST:  166mL OMNIPAQUE IOHEXOL 300 MG/ML  SOLN COMPARISON:  None. FINDINGS: Lower chest and abdominal wall:  No contributory findings. Hepatobiliary: No focal liver abnormality.No evidence of biliary obstruction or stone. Pancreas: Unremarkable. Spleen: Unremarkable. Adrenals/Urinary Tract: Negative adrenals. No hydronephrosis or stone. Small presumed cortical cyst left. Unremarkable bladder. Reproductive:No pathologic findings. Stomach/Bowel:  No obstruction. No appendicitis. Vascular/Lymphatic: No acute vascular abnormality. Aortic and branch vessel atherosclerosis with notable mild calcified plaque at the proximal left superficial femoral artery. No mass or adenopathy. Peritoneal: No ascites or pneumoperitoneum. Musculoskeletal: Small left iliac wing osteochondroma on the posterior surface. Acute osseous finding. IMPRESSION: 1.  No acute finding or explanation for symptoms. 2. Prominent for age atherosclerosis. Electronically Signed   By: Monte Fantasia M.D.   On: 11/09/2015 23:36      EKG Interpretation   Date/Time:  Tuesday November 10 2015 00:26:36 EST Ventricular Rate:  49 PR Interval:  152 QRS Duration: 116 QT Interval:  453 QTC Calculation: 409 R Axis:   82 Text Interpretation:  Sinus bradycardia Incomplete right bundle branch  block No significant change was found Confirmed by Wyvonnia Dusky  MD, Anice Wilshire  ([id number]) on 11/10/2015 12:33:00 AM      MDM   Final diagnoses:  Abdominal pain, unspecified abdominal location   ongoing abdominal pain with diarrhea for the past 3 days. Intermittent pain for the past month.  No chest pain or shortness of breath.  Exam with periumbilical pain. No guarding or rebound.  Labs are unremarkable. Urinalysis is negative. CT shows no acute explanation for patient's pain. Appendix is normal. No vomiting or stooling in the ED.  Patient feels improved in ED. No chest pain or abdominal pain. Tolerating by mouth. Unclear etiology of his abdominal pain. Will have follow-up with gastroenterology. Return precautions discussed.  I personally performed the services described in this documentation, which was scribed in my presence. The recorded information has been reviewed and is accurate.    Ezequiel Essex, MD 11/10/15 [phone]

## 2015-11-09 NOTE — ED Notes (Signed)
Pt reports intermittent abdominal pain with diarrhea and some emesis since Jan 1.

## 2015-11-10 ENCOUNTER — Telehealth: Payer: Self-pay | Admitting: *Deleted

## 2015-11-10 MED ORDER — ONDANSETRON HCL 4 MG PO TABS: 4.0000 mg | ORAL_TABLET | Freq: Four times a day (QID) | ORAL | Status: DC

## 2015-11-10 NOTE — Telephone Encounter (Signed)
Pharmacy called related to Rx: Pt was too sick to take prescription to pharmacy, requested verbal. ..Marland KitchenEDCM reviewed chart to fin prescription for Zofran.  Read prescription to PharmD.

## 2015-11-10 NOTE — Discharge Instructions (Signed)
Abdominal Pain, Adult Your testing is reassuring. Follow up with the stomach doctor. Return to the ED if you develop new or worsening symptoms. Many things can cause abdominal pain. Usually, abdominal pain is not caused by a disease and will improve without treatment. It can often be observed and treated at home. Your health care provider will do a physical exam and possibly order blood tests and X-rays to help determine the seriousness of your pain. However, in many cases, more time must pass before a clear cause of the pain can be found. Before that point, your health care provider may not know if you need more testing or further treatment. HOME CARE INSTRUCTIONS Monitor your abdominal pain for any changes. The following actions may help to alleviate any discomfort you are experiencing:  Only take over-the-counter or prescription medicines as directed by your health care provider.  Do not take laxatives unless directed to do so by your health care provider.  Try a clear liquid diet (broth, tea, or water) as directed by your health care provider. Slowly move to a bland diet as tolerated. SEEK MEDICAL CARE IF:  You have unexplained abdominal pain.  You have abdominal pain associated with nausea or diarrhea.  You have pain when you urinate or have a bowel movement.  You experience abdominal pain that wakes you in the night.  You have abdominal pain that is worsened or improved by eating food.  You have abdominal pain that is worsened with eating fatty foods.  You have a fever. SEEK IMMEDIATE MEDICAL CARE IF:  Your pain does not go away within 2 hours.  You keep throwing up (vomiting).  Your pain is felt only in portions of the abdomen, such as the right side or the left lower portion of the abdomen.  You pass bloody or black tarry stools. MAKE SURE YOU:  Understand these instructions.  Will watch your condition.  Will get help right away if you are not doing well or get  worse.   This information is not intended to replace advice given to you by your health care provider. Make sure you discuss any questions you have with your health care provider.   Document Released: 07/06/2005 Document Revised: 06/17/2015 Document Reviewed: 06/05/2013 Elsevier Interactive Patient Education [employer].

## 2015-11-11 ENCOUNTER — Encounter: Payer: Self-pay | Admitting: Internal Medicine

## 2015-11-11 ENCOUNTER — Telehealth: Payer: Self-pay | Admitting: Gastroenterology

## 2015-11-11 NOTE — Telephone Encounter (Signed)
Yes

## 2015-11-11 NOTE — Telephone Encounter (Signed)
appt letter mailed. OV made 2/17 at 0800 with AS

## 2015-11-11 NOTE — Telephone Encounter (Signed)
Pt was seen in the ER recently and was told to call us for a follow up appointment. Please advise if we are able to accept him as a new patient.

## 2015-11-27 ENCOUNTER — Telehealth: Payer: Self-pay | Admitting: Internal Medicine

## 2015-11-27 ENCOUNTER — Encounter: Payer: Self-pay | Admitting: Gastroenterology

## 2015-11-27 ENCOUNTER — Other Ambulatory Visit: Payer: Self-pay

## 2015-11-27 ENCOUNTER — Ambulatory Visit (INDEPENDENT_AMBULATORY_CARE_PROVIDER_SITE_OTHER): Payer: BLUE CROSS/BLUE SHIELD | Admitting: Gastroenterology

## 2015-11-27 VITALS — BP 136/84 | HR 65 | Temp 96.6°F | Ht 68.0 in | Wt 163.4 lb

## 2015-11-27 DIAGNOSIS — R1013 Epigastric pain: Secondary | ICD-10-CM

## 2015-11-27 DIAGNOSIS — R109 Unspecified abdominal pain: Secondary | ICD-10-CM | POA: Insufficient documentation

## 2015-11-27 DIAGNOSIS — R1310 Dysphagia, unspecified: Secondary | ICD-10-CM | POA: Diagnosis not present

## 2015-11-27 DIAGNOSIS — K625 Hemorrhage of anus and rectum: Secondary | ICD-10-CM

## 2015-11-27 MED ORDER — PEG 3350-KCL-NA BICARB-NACL 420 G PO SOLR
4000.0000 mL | ORAL | Status: DC
Start: 2015-11-27 — End: 2016-06-08

## 2015-11-27 MED ORDER — SUCRALFATE 1 GM/10ML PO SUSP: 1.0000 g | Freq: Four times a day (QID) | ORAL | Status: DC

## 2015-11-27 MED ORDER — HYOSCYAMINE SULFATE 0.125 MG SL SUBL: 0.1250 mg | SUBLINGUAL_TABLET | SUBLINGUAL | Status: DC | PRN

## 2015-11-27 NOTE — Assessment & Plan Note (Signed)
Likely benign in setting of recent illness. Proceed with colonoscopy as planned for screening purposes. CBC normal.

## 2015-11-27 NOTE — Assessment & Plan Note (Addendum)
55 year old male with acute onset of pain and diarrhea in Jan 2017, presenting to the ED with unremarkable labs and CT. Diarrhea has improved since this time, and he has days of no bowel movements. May have had self-limiting infectious process now with post-infectious symptoms. Low-volume hematochezia may be benign in setting of recent diarrhea illness but unable to exclude occult etiology. As diarrhea has improved and he has days without bowel movements, will hold on stool studies. If any recurrence, will obtain stool studies ASAP. As he has never had a colonoscopy, will proceed with this in the near future for further evaluation. As of note, takes Ibuprofen prn, which he has been asked to hold for now.   Proceed with TCS with Dr. Gala Romney in near future: the risks, benefits, and alternatives have been discussed with the patient in detail. The patient states understanding and desires to proceed. Levsin prn abdominal cramping As of note, recent cardiac stent place Oct 2016. HE IS ON PLAVIX. Discussed with cardiology, Jory Sims, NP, who stated no contraindication for procedure.

## 2015-11-27 NOTE — Telephone Encounter (Signed)
Agreed -

## 2015-11-27 NOTE — Patient Instructions (Signed)
We have scheduled you for a colonoscopy, upper endoscopy, and dilation with Dr. Gala Romney in the near future.  For abdominal cramping, take Levsin as needed. Monitor for constipation. I have also sent in carafate suspension to take four times a day.   I will double check with cardiology to make sure it's ok to proceed. You will STAY ON PLAVIX.   If you start having severe diarrhea or worsening abdominal pain, please call.

## 2015-11-27 NOTE — Assessment & Plan Note (Signed)
Solid food dysphagia since Oct 2016, better with Protonix daily. However, dysphagia still persists. Likely related to uncontrolled GERD, web, ring, stricture. Will need EGD with dilation at time of colonoscopy.   Proceed with upper endoscopy and dilation in the near future with Dr. Gala Romney. The risks, benefits, and alternatives have been discussed in detail with patient. They have stated understanding and desire to proceed.  Continue Protonix once daily.  Carafate short-course

## 2015-11-27 NOTE — Telephone Encounter (Signed)
I spoke with the pt, informed him that we do not get samples of the medications he was prescribed. He also said the pharmacy does not have his updated insurance information. I called the pharmacy and have faxed our copy of his insurance info, also advised them that they can change the carafate to the pill form if the liquid is too expensive. I have informed the pt how to crush it and mix with water. Routing to AS for FYI

## 2015-11-27 NOTE — Telephone Encounter (Signed)
Pt was seen earlier this morning and the pharmacy called them to let them know that the prescriptions were going to cost them over $200 to get. Wife is asking is there anything else he can take and do we have any samples.  They would like to know before they leave [place] so they can stop by to pick up. Pt uses [facility] in [place]. Please advise and call [phone]

## 2015-11-27 NOTE — Progress Notes (Signed)
Primary Care Physician:  [name] Primary Gastroenterologist:  Dr. Gala Romney   Chief Complaint  Patient presents with  . Abdominal Pain    HPI:   Anthony Hensley is a 55 y.o. male presenting today at the request of the [facility] secondary to abdominal pain.   Stomach hurts "all the time". No worsening or relieving factors. One day diarrhea, one day constipation. Pain eases up some but never really goes away. Pain present since January. Was having diarrhea when went to the ED late Jan 2017. Was going 5-6 times, sometimes more. Notes intermittent rectal bleeding. "Right much of it" at times. +chills, no fever. CBC, CMP, lipase all normal. Pain located in center of stomach. Noted N/V but eased off some. No unexplained weight loss or lack of appetite. 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 Takes Ibuprofen as needed. Took Zofran for nausea and got constipated, now better from a nausea standpoint. Prescribed Protonix in Oct 2016 as he was getting strangled at night, difficulty swallowing solid foods occasionally but better since PPI started. Diarrhea has improved significantly.    No prior EGD or colonoscopy.   Past Medical History  Diagnosis Date  . Hypertension   . Hypercholesterolemia   . CAD (coronary artery disease)     Past Surgical History  Procedure Laterality Date  . Cardiac catheterization N/A 07/13/2015    Procedure: Left Heart Cath and Coronary Angiography;  Surgeon: Burnell Blanks, MD;  Location: [facility];  Service: Cardiovascular;  Laterality: N/A;  . Cardiac catheterization N/A 07/13/2015    PCI + DES to the mid circ. LVEF was normal at 65%    Current Outpatient Prescriptions  Medication Sig Dispense Refill  . aspirin 81 MG EC tablet Take 1 tablet (81 mg total) by mouth daily. 30 tablet   . atorvastatin (LIPITOR) 80 MG tablet Take 1 tablet (80 mg total) by mouth daily at 6 PM. 30 tablet 5  . clopidogrel (PLAVIX) 75 MG  tablet Take 1 tablet (75 mg total) by mouth daily with breakfast. 30 tablet 5  . enalapril (VASOTEC) 10 MG tablet Take 1 tablet (10 mg total) by mouth daily. 30 tablet 5  . Glycerin-Hypromellose-PEG 400 (VISINE TEARS OP) Apply 1 drop to eye daily.    . metoprolol tartrate (LOPRESSOR) 25 MG tablet Take 0.5 tablets (12.5 mg total) by mouth 2 (two) times daily. 60 tablet 5  . pantoprazole (PROTONIX) 40 MG tablet Take 1 tablet (40 mg total) by mouth daily. 30 tablet 5   No current facility-administered medications for this visit.    Allergies as of 11/27/2015  . (No Known Allergies)    Family History  Problem Relation Age of Onset  . Heart attack Father 34    Deceased  . Colon cancer Neg Hx     Social History   Social History  . Marital Status: Single    Spouse Name: N/A  . Number of Children: N/A  . Years of Education: N/A   Occupational History  . Machine Teacher, English as a foreign language [employer]   Social History Main Topics  . Smoking status: Current Some Day Smoker -- 0.75 packs/day for 35 years    Types: Cigarettes  . Smokeless tobacco: Never Used  . Alcohol Use: No  . Drug Use: No  . Sexual Activity: Not on file   Other Topics Concern  . Not on file   Social History  Narrative    Review of Systems: Gen: see HPI  CV: Denies chest pain, heart palpitations, peripheral edema, syncope.  Resp: Denies shortness of breath at rest or with exertion. Denies wheezing or cough.  GI: see HPI  GU : Denies urinary burning, urinary frequency, urinary hesitancy MS: ankle pain occasionally, hip pain  Derm: Denies rash, itching, dry skin Psych: Denies depression, anxiety, memory loss, and confusion Heme: see HPI   Physical Exam: BP 136/84 mmHg  Pulse 65  Temp(Src) 96.6 F (35.9 C)  Ht 5\' 8"  (1.727 m)  Wt 163 lb 6.4 oz (74.118 kg)  BMI 24.85 kg/m2 General:   Alert and oriented. Pleasant and cooperative. Well-nourished and well-developed.  Head:  Normocephalic and  atraumatic. Eyes:  Without icterus, sclera clear and conjunctiva pink.  Ears:  Normal auditory acuity. Nose:  No deformity, discharge,  or lesions. Mouth:  No deformity or lesions, oral mucosa pink.  Lungs:  Clear to auscultation bilaterally. No wheezes, rales, or rhonchi. No distress.  Heart:  S1, S2 present without murmurs appreciated.  Abdomen:  +BS, soft, mild TTP upper abdomen and non-distended. No HSM noted. No guarding or rebound. No masses appreciated.  Rectal:  Deferred  Msk:  Symmetrical without gross deformities. Normal posture. Extremities:  Without edema. Neurologic:  Alert and  oriented x4;  grossly normal neurologically. Psych:  Alert and cooperative. Normal mood and affect.  Lab Results  Component Value Date   WBC 6.0 11/09/2015   HGB 15.8 11/09/2015   HCT 46.9 11/09/2015   MCV 91.6 11/09/2015   PLT 205 11/09/2015   Lab Results  Component Value Date   ALT 31 11/09/2015   AST 28 11/09/2015   ALKPHOS 80 11/09/2015   BILITOT 0.5 11/09/2015   Lab Results  Component Value Date   CREATININE 0.92 11/09/2015   BUN 19 11/09/2015   NA 137 11/09/2015   K 3.7 11/09/2015   CL 102 11/09/2015   CO2 27 11/09/2015    CT Jan 2017: no acute findings.

## 2015-11-30 NOTE — Progress Notes (Signed)
cc'ed to pcp °

## 2015-12-12 ENCOUNTER — Other Ambulatory Visit: Payer: Self-pay | Admitting: Cardiology

## 2015-12-31 ENCOUNTER — Encounter (HOSPITAL_COMMUNITY): Payer: Self-pay | Admitting: *Deleted

## 2015-12-31 ENCOUNTER — Encounter (HOSPITAL_COMMUNITY)
Admission: RE | Disposition: A | Discharge status: Home / Self Care | Payer: Self-pay | Source: Ambulatory Visit | Attending: Internal Medicine

## 2015-12-31 ENCOUNTER — Ambulatory Visit (HOSPITAL_COMMUNITY)
Admission: RE | Admit: 2015-12-31 | Discharge: 2015-12-31 | Disposition: A | Discharge status: Home / Self Care | Payer: BLUE CROSS/BLUE SHIELD | Source: Ambulatory Visit | Attending: Internal Medicine | Admitting: Internal Medicine

## 2015-12-31 DIAGNOSIS — K648 Other hemorrhoids: Secondary | ICD-10-CM | POA: Diagnosis not present

## 2015-12-31 DIAGNOSIS — Z7982 Long term (current) use of aspirin: Secondary | ICD-10-CM | POA: Insufficient documentation

## 2015-12-31 DIAGNOSIS — D123 Benign neoplasm of transverse colon: Secondary | ICD-10-CM

## 2015-12-31 DIAGNOSIS — R1013 Epigastric pain: Secondary | ICD-10-CM | POA: Insufficient documentation

## 2015-12-31 DIAGNOSIS — K3189 Other diseases of stomach and duodenum: Secondary | ICD-10-CM

## 2015-12-31 DIAGNOSIS — K210 Gastro-esophageal reflux disease with esophagitis: Secondary | ICD-10-CM | POA: Insufficient documentation

## 2015-12-31 DIAGNOSIS — F17210 Nicotine dependence, cigarettes, uncomplicated: Secondary | ICD-10-CM | POA: Insufficient documentation

## 2015-12-31 DIAGNOSIS — Z86010 Personal history of colonic polyps: Secondary | ICD-10-CM | POA: Insufficient documentation

## 2015-12-31 DIAGNOSIS — K649 Unspecified hemorrhoids: Secondary | ICD-10-CM | POA: Diagnosis not present

## 2015-12-31 DIAGNOSIS — K2289 Other specified disease of esophagus: Secondary | ICD-10-CM | POA: Insufficient documentation

## 2015-12-31 DIAGNOSIS — E7800 Pure hypercholesterolemia, unspecified: Secondary | ICD-10-CM | POA: Insufficient documentation

## 2015-12-31 DIAGNOSIS — K449 Diaphragmatic hernia without obstruction or gangrene: Secondary | ICD-10-CM | POA: Diagnosis not present

## 2015-12-31 DIAGNOSIS — I10 Essential (primary) hypertension: Secondary | ICD-10-CM | POA: Diagnosis not present

## 2015-12-31 DIAGNOSIS — R109 Unspecified abdominal pain: Secondary | ICD-10-CM | POA: Insufficient documentation

## 2015-12-31 DIAGNOSIS — K625 Hemorrhage of anus and rectum: Secondary | ICD-10-CM

## 2015-12-31 DIAGNOSIS — I2510 Atherosclerotic heart disease of native coronary artery without angina pectoris: Secondary | ICD-10-CM | POA: Insufficient documentation

## 2015-12-31 DIAGNOSIS — K2970 Gastritis, unspecified, without bleeding: Secondary | ICD-10-CM | POA: Insufficient documentation

## 2015-12-31 DIAGNOSIS — D125 Benign neoplasm of sigmoid colon: Secondary | ICD-10-CM

## 2015-12-31 DIAGNOSIS — K229 Disease of esophagus, unspecified: Secondary | ICD-10-CM | POA: Diagnosis not present

## 2015-12-31 DIAGNOSIS — K921 Melena: Secondary | ICD-10-CM | POA: Insufficient documentation

## 2015-12-31 DIAGNOSIS — Z79899 Other long term (current) drug therapy: Secondary | ICD-10-CM | POA: Diagnosis not present

## 2015-12-31 DIAGNOSIS — Z7902 Long term (current) use of antithrombotics/antiplatelets: Secondary | ICD-10-CM | POA: Diagnosis not present

## 2015-12-31 DIAGNOSIS — R1310 Dysphagia, unspecified: Secondary | ICD-10-CM | POA: Diagnosis not present

## 2015-12-31 DIAGNOSIS — K5730 Diverticulosis of large intestine without perforation or abscess without bleeding: Secondary | ICD-10-CM

## 2015-12-31 HISTORY — PX: COLONOSCOPY: SHX5424

## 2015-12-31 HISTORY — PX: MALONEY DILATION: SHX5535

## 2015-12-31 HISTORY — PX: ESOPHAGOGASTRODUODENOSCOPY: SHX5428

## 2015-12-31 SURGERY — COLONOSCOPY
Anesthesia: Moderate Sedation

## 2015-12-31 MED ORDER — ONDANSETRON HCL 4 MG/2ML IJ SOLN
INTRAMUSCULAR | Status: AC
  Filled 2015-12-31: qty 2

## 2015-12-31 MED ORDER — MIDAZOLAM HCL 5 MG/5ML IJ SOLN
INTRAMUSCULAR | Status: DC | PRN
  Administered 2015-12-31: 1 mg via INTRAVENOUS
  Administered 2015-12-31: 2 mg via INTRAVENOUS
  Administered 2015-12-31 (×2): 1 mg via INTRAVENOUS

## 2015-12-31 MED ORDER — ONDANSETRON HCL 4 MG/2ML IJ SOLN
INTRAMUSCULAR | Status: DC | PRN
  Administered 2015-12-31: 4 mg via INTRAVENOUS

## 2015-12-31 MED ORDER — LIDOCAINE VISCOUS 2 % MT SOLN
OROMUCOSAL | Status: DC | PRN
  Administered 2015-12-31: 1 via OROMUCOSAL

## 2015-12-31 MED ORDER — MEPERIDINE HCL 100 MG/ML IJ SOLN
INTRAMUSCULAR | Status: DC | PRN
  Administered 2015-12-31: 50 mg via INTRAVENOUS
  Administered 2015-12-31: 25 mg via INTRAVENOUS

## 2015-12-31 MED ORDER — MEPERIDINE HCL 100 MG/ML IJ SOLN
INTRAMUSCULAR | Status: AC
  Filled 2015-12-31: qty 2

## 2015-12-31 MED ORDER — LIDOCAINE VISCOUS 2 % MT SOLN
OROMUCOSAL | Status: AC
  Filled 2015-12-31: qty 15

## 2015-12-31 MED ORDER — MIDAZOLAM HCL 5 MG/5ML IJ SOLN
INTRAMUSCULAR | Status: AC
  Filled 2015-12-31: qty 10

## 2015-12-31 MED ORDER — STERILE WATER FOR IRRIGATION IR SOLN
Status: DC | PRN
  Administered 2015-12-31: 2.5 mL

## 2015-12-31 MED ORDER — SODIUM CHLORIDE 0.9 % IV SOLN
INTRAVENOUS | Status: DC
  Administered 2015-12-31: 1000 mL via INTRAVENOUS

## 2015-12-31 NOTE — Op Note (Signed)
[facility] Patient Name: Anthony Hensley Procedure Date: 12/31/2015 7:37 AM MRN: [id number] Date of Birth: 17-Jul-1961 Attending MD: Norvel Richards , MD CSN: [id number] Age: 55 Admit Type: Outpatient Procedure:                Upper GI endoscopy Indications:              Dysphagia, Epigastric abdominal pain Providers:                Norvel Richards, MD, Lurline Del, RN, Georgeann Oppenheim, Technician Referring MD:             Gerarda Fraction (Referring MD) Medicines:                Midazolam 5 mg IV, Meperidine 75 mg IV, Ondansetron                            4 mg IV Complications:            . No immediate complications. Estimated Blood Loss:     Estimated blood loss was minimal. Procedure:                Pre-Anesthesia Assessment:                           - Prior to the procedure, a History and Physical                            was performed, and patient medications and                            allergies were reviewed. The patient's tolerance of                            previous anesthesia was also reviewed. The risks                            and benefits of the procedure and the sedation                            options and risks were discussed with the patient.                            All questions were answered, and informed consent                            was obtained. Prior Anticoagulants: The patient                            last took aspirin 1 day and Plavix (clopidogrel) 1                            day prior to the procedure. ASA Grade Assessment:  II - A patient with mild systemic disease. After                            reviewing the risks and benefits, the patient was                            deemed in satisfactory condition to undergo the                            procedure.                           After obtaining informed consent, the endoscope was                            passed under direct  vision. Throughout the                            procedure, the patient's blood pressure, pulse, and                            oxygen saturations were monitored continuously. The                            [id number] scope was introduced through the                            mouth, and advanced to the second part of duodenum.                            The upper GI endoscopy was accomplished without                            difficulty. The patient tolerated the procedure                            well. Scope In: 7:53:11 AM Scope Out: 8:02:11 AM Total Procedure Duration: 0 hours 9 minutes 0 seconds  Findings:      LA Grade A (one or more mucosal breaks less than 5 mm, not extending       between tops of 2 mucosal folds) esophagitis with no bleeding was found       36 to 37 cm from the incisors. The scope was withdrawn. Small nodules       distal esophagus. Appeared to be benign. Tubular esophagus pain       throughout its course.      Diffuse moderately erythematous mucosa without bleeding was found in the       entire examined stomach. Dilation was performed with a Maloney dilator       with no resistance at 56 Fr. Estimated blood loss: none abnormal distal       esophagus biopsied for histologic study. Estimated blood loss was       minimal. This was biopsied with a cold forceps for histology. gastric       mucosal biopsy done after dilation as well. Biopsies were  taken with a       cold forceps for histology. Biopsies were taken with a cold forceps for       histology.      The second portion of the duodenum and examined duodenum were normal.      Diffuse moderately erythematous mucosa without bleeding was found in the       entire examined stomach. Biopsies were taken with a cold forceps for       histology. Estimated blood loss was minimal. Impression:               - LA Grade A esophagitis. Maloney dilation                            performed. Abnormal gastric  mucosa. Status post                            biopsy. Hiatal hernia.                           - LA Grade A reflux esophagitis. Biopsied. Moderate Sedation:      Moderate (conscious) sedation was administered by the endoscopy nurse       and supervised by the endoscopist. The following parameters were       monitored: oxygen saturation, heart rate, blood pressure, respiratory       rate, EKG, adequacy of pulmonary ventilation, and response to care.       Total physician intraservice time was 40 minutes. Recommendation:           - Patient has a contact number available for                            emergencies. The signs and symptoms of potential                            delayed complications were discussed with the                            patient. Return to normal activities tomorrow.                            Written discharge instructions were provided to the                            patient.                           - Advance diet as tolerated today.                           - Continue present medications.                           - Await pathology results.                           - Repeat upper endoscopy is not recommended.                           -  Return to GI office after studies are complete.                           - Patient has a contact number available for                            emergencies. The signs and symptoms of potential                            delayed complications were discussed with the                            patient. Return to normal activities tomorrow.                            Written discharge instructions were provided to the                            patient.                           - Advance diet as tolerated today.                           - Continue present medications.                           - Await pathology results. Procedure Code(s):        --- Professional ---                           [phone],  Esophagogastroduodenoscopy, flexible,                            transoral; with biopsy, single or multiple                           43450, Dilation of esophagus, by unguided sound or                            bougie, single or multiple passes                           99152, Moderate sedation services provided by the                            same physician or other qualified health care                            professional performing the diagnostic or                            therapeutic service that the sedation supports,                            requiring the presence of  an independent trained                            observer to assist in the monitoring of the                            patient's level of consciousness and physiological                            status; initial 15 minutes of intraservice time,                            patient age 32 years or older                           [phone], Moderate sedation services; each additional                            15 minutes intraservice time                           [phone], Moderate sedation services; each additional                            15 minutes intraservice time Diagnosis Code(s):        --- Professional ---                           K21.0, Gastro-esophageal reflux disease with                            esophagitis                           R13.10, Dysphagia, unspecified                           R10.13, Epigastric pain CPT copyright 2016 American Medical Association. All rights reserved. The codes documented in this report are preliminary and upon coder review may  be revised to meet current compliance requirements. Cristopher Estimable. Loukisha Gunnerson, MD Norvel Richards, MD 12/31/2015 8:52:15 AM This report has been signed electronically. Number of Addenda: 0

## 2015-12-31 NOTE — H&P (Signed)
Primary Care Physician: [name] Primary Gastroenterologist: Dr. Gala Romney   Chief Complaint  Patient presents with  . Abdominal Pain    HPI:  Anthony Hensley is a 55 y.o. male presenting today at the request of the [facility] secondary to abdominal pain.   Stomach hurts "all the time". No worsening or relieving factors. One day diarrhea, one day constipation. Pain eases up some but never really goes away. Pain present since January. Was having diarrhea when went to the ED late Jan 2017. Was going 5-6 times, sometimes more. Notes intermittent rectal bleeding. "Right much of it" at times. +chills, no fever. CBC, CMP, lipase all normal. Pain located in center of stomach. Noted N/V but eased off some. No unexplained weight loss or lack of appetite. 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 Takes Ibuprofen as needed. Took Zofran for nausea and got constipated, now better from a nausea standpoint. Prescribed Protonix in Oct 2016 as he was getting strangled at night, difficulty swallowing solid foods occasionally but better since PPI started. Diarrhea has improved significantly.  Patient hasn't noted any change since last seen in office on 11/27/2015.    No prior EGD or colonoscopy.   Past Medical History  Diagnosis Date  . Hypertension   . Hypercholesterolemia   . CAD (coronary artery disease)     Past Surgical History  Procedure Laterality Date  . Cardiac catheterization N/A 07/13/2015    Procedure: Left Heart Cath and Coronary Angiography; Surgeon: Burnell Blanks, MD; Location: [facility]; Service: Cardiovascular; Laterality: N/A;  . Cardiac catheterization N/A 07/13/2015    PCI + DES to the mid circ. LVEF was normal at 65%    Current Outpatient Prescriptions  Medication Sig Dispense Refill  . aspirin 81 MG EC tablet Take 1 tablet (81 mg total) by mouth daily. 30 tablet   .  atorvastatin (LIPITOR) 80 MG tablet Take 1 tablet (80 mg total) by mouth daily at 6 PM. 30 tablet 5  . clopidogrel (PLAVIX) 75 MG tablet Take 1 tablet (75 mg total) by mouth daily with breakfast. 30 tablet 5  . enalapril (VASOTEC) 10 MG tablet Take 1 tablet (10 mg total) by mouth daily. 30 tablet 5  . Glycerin-Hypromellose-PEG 400 (VISINE TEARS OP) Apply 1 drop to eye daily.    . metoprolol tartrate (LOPRESSOR) 25 MG tablet Take 0.5 tablets (12.5 mg total) by mouth 2 (two) times daily. 60 tablet 5  . pantoprazole (PROTONIX) 40 MG tablet Take 1 tablet (40 mg total) by mouth daily. 30 tablet 5   No current facility-administered medications for this visit.    Allergies as of 11/27/2015  . (No Known Allergies)    Family History  Problem Relation Age of Onset  . Heart attack Father 32    Deceased  . Colon cancer Neg Hx     Social History   Social History  . Marital Status: Single    Spouse Name: N/A  . Number of Children: N/A  . Years of Education: N/A   Occupational History  . Machine Teacher, English as a foreign language And [employer]   Social History Main Topics  . Smoking status: Current Some Day Smoker -- 0.75 packs/day for 35 years    Types: Cigarettes  . Smokeless tobacco: Never Used  . Alcohol Use: No  . Drug Use: No  . Sexual Activity: Not on file   Other Topics Concern  .  Not on file   Social History Narrative    Review of Systems: Gen: see HPI  CV: Denies chest pain, heart palpitations, peripheral edema, syncope.  Resp: Denies shortness of breath at rest or with exertion. Denies wheezing or cough.  GI: see HPI  GU : Denies urinary burning, urinary frequency, urinary hesitancy MS: ankle pain occasionally, hip pain  Derm: Denies rash, itching, dry skin Psych: Denies depression, anxiety, memory loss, and confusion Heme: see HPI   Physical  Exam:  General: Alert and oriented. Pleasant and cooperative. Well-nourished and well-developed.  Head: Normocephalic and atraumatic. Eyes: Without icterus, sclera clear and conjunctiva pink.  Ears: Normal auditory acuity. Nose: No deformity, discharge, or lesions. Mouth: No deformity or lesions, oral mucosa pink.  Lungs: Clear to auscultation bilaterally. No wheezes, rales, or rhonchi. No distress.  Heart: S1, S2 present without murmurs appreciated.  Abdomen: +BS, soft, mild TTP upper abdomen and non-distended. No HSM noted. No guarding or rebound. No masses appreciated.  Rectal: Deferred  Msk: Symmetrical without gross deformities. Normal posture. Extremities: Without edema. Neurologic: Alert and oriented x4; grossly normal neurologically. Psych: Alert and cooperative. Normal mood and affect.   Recent Labs    Lab Results  Component Value Date   WBC 6.0 11/09/2015   HGB 15.8 11/09/2015   HCT 46.9 11/09/2015   MCV 91.6 11/09/2015   PLT 205 11/09/2015      Recent Labs    Lab Results  Component Value Date   ALT 31 11/09/2015   AST 28 11/09/2015   ALKPHOS 80 11/09/2015   BILITOT 0.5 11/09/2015      Recent Labs    Lab Results  Component Value Date   CREATININE 0.92 11/09/2015   BUN 19 11/09/2015   NA 137 11/09/2015   K 3.7 11/09/2015   CL 102 11/09/2015   CO2 27 11/09/2015      CT Jan 2017: no acute findings.                 Status: Edited Related Problem: Abdominal pain   Expand All Collapse All   55 year old male with acute onset of pain and diarrhea in Jan 2017, presenting to the ED with unremarkable labs and CT. Diarrhea has improved since this time, and he has days of no bowel movements. May have had self-limiting infectious process now with post-infectious symptoms. Low-volume hematochezia may be benign in setting of recent diarrhea illness but unable to  exclude occult etiology. As diarrhea has improved and he has days without bowel movements, will hold on stool studies. If any recurrence, will obtain stool studies ASAP. As he has never had a colonoscopy, will proceed with this in the near future for further evaluation. As of note, takes Ibuprofen prn, which he has been asked to hold for now.   Offered both an EGD with esophageal dilation and diagnostic colonoscopy today.  The risks, benefits, limitations, imponderables and alternatives regarding both EGD and colonoscopy have been reviewed with the patient. Questions have been answered. All parties agreeable.  The patient states understanding and desires to proceed. Levsin prn abdominal cramping          Revision History       Date/Time User Action    > 11/27/2015 11:04 AM Orvil Feil, NP Edit     11/27/2015 11:02 AM Orvil Feil, NP Edit     11/27/2015 11:01 AM Orvil Feil, NP Create  Status: Written Related Problem: Rectal bleeding   Expand All Collapse All   Likely benign in setting of recent illness. Proceed with colonoscopy as planned for screening purposes. CBC normal.                 Status: Written Related Problem: Dysphagia   Expand All Collapse All   Solid food dysphagia since Oct 2016, better with Protonix daily. However, dysphagia still persists. Likely related to uncontrolled GERD, web, ring, stricture. Will need EGD with dilation at time of colonoscopy.   Proceed with upper endoscopy and dilation in the near future with Dr. Gala Romney. The risks, benefits, and alternatives have been discussed in detail with patient. They have stated understanding and desire to proceed.  Continue Protonix once daily.  Carafate short-course                Status: Signed       Expand All Collapse All   cc'ed to pcp             Psych Notes     No notes of this type exist for this encounter.     [facility] Patient Outreach     No  notes of this type exist for this encounter.     Not recorded     Medications Ordered This Encounter       Disp Refills Start End    hyoscyamine (LEVSIN SL) 0.125 MG SL tablet 60 tablet 3 11/27/2015     Place 1 tablet (0.125 mg total) under the tongue every 4 (four) hours as needed. - Sublingual    sucralfate (CARAFATE) 1 GM/10ML suspension 420 mL 1 11/27/2015     Take 10 mLs (1 g total) by mouth 4 (four) times daily. - Oral      Discontinued Medications       Reason for Discontinue    nicotine (NICODERM CQ - DOSED IN MG/24 HOURS) 21 mg/24hr patch Error    nitroGLYCERIN (NITROSTAT) 0.4 MG SL tablet Error    ondansetron (ZOFRAN) 4 MG tablet Error         Patient Instructions     We have scheduled you for a colonoscopy, upper endoscopy, and dilation with Dr. Gala Romney in the near future.  For abdominal cramping, take Levsin as needed. Monitor for constipation. I have also sent in carafate suspension to take four times a day.   I will double check with cardiology to make sure it's ok to proceed. You will STAY ON PLAVIX.   If you start having severe diarrhea or worsening abdominal pain, please call.

## 2015-12-31 NOTE — Discharge Instructions (Signed)
Colonoscopy Discharge Instructions  Read the instructions outlined below and refer to this sheet in the next few weeks. These discharge instructions provide you with general information on caring for yourself after you leave the hospital. Your doctor may also give you specific instructions. While your treatment has been planned according to the most current medical practices available, unavoidable complications occasionally occur. If you have any problems or questions after discharge, call Dr. Gala Romney at [phone]. ACTIVITY  You may resume your regular activity, but move at a slower pace for the next 24 hours.   Take frequent rest periods for the next 24 hours.   Walking will help get rid of the air and reduce the bloated feeling in your belly (abdomen).   No driving for 24 hours (because of the medicine (anesthesia) used during the test).    Do not sign any important legal documents or operate any machinery for 24 hours (because of the anesthesia used during the test).  NUTRITION  Drink plenty of fluids.   You may resume your normal diet as instructed by your doctor.   Begin with a light meal and progress to your normal diet. Heavy or fried foods are harder to digest and may make you feel sick to your stomach (nauseated).   Avoid alcoholic beverages for 24 hours or as instructed.  MEDICATIONS  You may resume your normal medications unless your doctor tells you otherwise.  WHAT YOU CAN EXPECT TODAY  Some feelings of bloating in the abdomen.   Passage of more gas than usual.   Spotting of blood in your stool or on the toilet paper.  IF YOU HAD POLYPS REMOVED DURING THE COLONOSCOPY:  No aspirin products for 7 days or as instructed.   No alcohol for 7 days or as instructed.   Eat a soft diet for the next 24 hours.  FINDING OUT THE RESULTS OF YOUR TEST Not all test results are available during your visit. If your test results are not back during the visit, make an appointment  with your caregiver to find out the results. Do not assume everything is normal if you have not heard from your caregiver or the medical facility. It is important for you to follow up on all of your test results.  SEEK IMMEDIATE MEDICAL ATTENTION IF:  You have more than a spotting of blood in your stool.   Your belly is swollen (abdominal distention).   You are nauseated or vomiting.   You have a temperature over 101.  You have abdominal pain or discomfort that is severe or gets worse throughout the day. EGD Discharge instructions Please read the instructions outlined below and refer to this sheet in the next few weeks. These discharge instructions provide you with general information on caring for yourself after you leave the hospital. Your doctor may also give you specific instructions. While your treatment has been planned according to the most current medical practices available, unavoidable complications occasionally occur. If you have any problems or questions after discharge, please call your doctor. ACTIVITY You may resume your regular activity but move at a slower pace for the next 24 hours.  Take frequent rest periods for the next 24 hours.  Walking will help expel (get rid of) the air and reduce the bloated feeling in your abdomen.  No driving for 24 hours (because of the anesthesia (medicine) used during the test).  You may shower.  Do not sign any important legal documents or operate any machinery for 24  hours (because of the anesthesia used during the test).  NUTRITION Drink plenty of fluids.  You may resume your normal diet.  Begin with a light meal and progress to your normal diet.  Avoid alcoholic beverages for 24 hours or as instructed by your caregiver.  MEDICATIONS You may resume your normal medications unless your caregiver tells you otherwise.  WHAT YOU CAN EXPECT TODAY You may experience abdominal discomfort such as a feeling of fullness or gas pains.   FOLLOW-UP Your doctor will discuss the results of your test with you.  SEEK IMMEDIATE MEDICAL ATTENTION IF ANY OF THE FOLLOWING OCCUR: Excessive nausea (feeling sick to your stomach) and/or vomiting.  Severe abdominal pain and distention (swelling).  Trouble swallowing.  Temperature over 101 F (37.8 C).  Rectal bleeding or vomiting of blood.    GERD information provided  Diverticulosis, hemorrhoids and colon polyp information provided  Further recommendations to follow pending review of pathology report  Colon Polyps Polyps are lumps of extra tissue growing inside the body. Polyps can grow in the large intestine (colon). Most colon polyps are noncancerous (benign). However, some colon polyps can become cancerous over time. Polyps that are larger than a pea may be harmful. To be safe, caregivers remove and test all polyps. CAUSES  Polyps form when mutations in the genes cause your cells to grow and divide even though no more tissue is needed. RISK FACTORS There are a number of risk factors that can increase your chances of getting colon polyps. They include: Being older than 50 years. Family history of colon polyps or colon cancer. Long-term colon diseases, such as colitis or Crohn disease. Being overweight. Smoking. Being inactive. Drinking too much alcohol. SYMPTOMS  Most small polyps do not cause symptoms. If symptoms are present, they may include: Blood in the stool. The stool may look dark red or black. Constipation or diarrhea that lasts longer than 1 week. DIAGNOSIS People often do not know they have polyps until their caregiver finds them during a regular checkup. Your caregiver can use 4 tests to check for polyps: Digital rectal exam. The caregiver wears gloves and feels inside the rectum. This test would find polyps only in the rectum. Barium enema. The caregiver puts a liquid called barium into your rectum before taking X-rays of your colon. Barium makes your colon  look white. Polyps are dark, so they are easy to see in the X-ray pictures. Sigmoidoscopy. A thin, flexible tube (sigmoidoscope) is placed into your rectum. The sigmoidoscope has a light and tiny camera in it. The caregiver uses the sigmoidoscope to look at the last third of your colon. Colonoscopy. This test is like sigmoidoscopy, but the caregiver looks at the entire colon. This is the most common method for finding and removing polyps. TREATMENT  Any polyps will be removed during a sigmoidoscopy or colonoscopy. The polyps are then tested for cancer. PREVENTION  To help lower your risk of getting more colon polyps: Eat plenty of fruits and vegetables. Avoid eating fatty foods. Do not smoke. Avoid drinking alcohol. Exercise every day. Lose weight if recommended by your caregiver. Eat plenty of calcium and folate. Foods that are rich in calcium include milk, cheese, and broccoli. Foods that are rich in folate include chickpeas, kidney beans, and spinach. HOME CARE INSTRUCTIONS Keep all follow-up appointments as directed by your caregiver. You may need periodic exams to check for polyps. SEEK MEDICAL CARE IF: You notice bleeding during a bowel movement.   This information is not  intended to replace advice given to you by your health care provider. Make sure you discuss any questions you have with your health care provider.   Document Released: 06/22/2004 Document Revised: 10/17/2014 Document Reviewed: 12/06/2011 Elsevier Interactive Patient Education 2016 [employer]. Hemorrhoids Hemorrhoids are swollen veins around the rectum or anus. There are two types of hemorrhoids:  Internal hemorrhoids. These occur in the veins just inside the rectum. They may poke through to the outside and become irritated and painful. External hemorrhoids. These occur in the veins outside the anus and can be felt as a painful swelling or hard lump near the anus. CAUSES Pregnancy.  Obesity.  Constipation or  diarrhea.  Straining to have a bowel movement.  Sitting for long periods on the toilet. Heavy lifting or other activity that caused you to strain. Anal intercourse. SYMPTOMS  Pain.  Anal itching or irritation.  Rectal bleeding.  Fecal leakage.  Anal swelling.  One or more lumps around the anus.  DIAGNOSIS  Your caregiver may be able to diagnose hemorrhoids by visual examination. Other examinations or tests that may be performed include:  Examination of the rectal area with a gloved hand (digital rectal exam).  Examination of anal canal using a small tube (scope).  A blood test if you have lost a significant amount of blood. A test to look inside the colon (sigmoidoscopy or colonoscopy). TREATMENT Most hemorrhoids can be treated at home. However, if symptoms do not seem to be getting better or if you have a lot of rectal bleeding, your caregiver may perform a procedure to help make the hemorrhoids get smaller or remove them completely. Possible treatments include:  Placing a rubber band at the base of the hemorrhoid to cut off the circulation (rubber band ligation).  Injecting a chemical to shrink the hemorrhoid (sclerotherapy).  Using a tool to burn the hemorrhoid (infrared light therapy).  Surgically removing the hemorrhoid (hemorrhoidectomy).  Stapling the hemorrhoid to block blood flow to the tissue (hemorrhoid stapling).  HOME CARE INSTRUCTIONS  Eat foods with fiber, such as whole grains, beans, nuts, fruits, and vegetables. Ask your doctor about taking products with added fiber in them (fibersupplements). Increase fluid intake. Drink enough water and fluids to keep your urine clear or pale yellow.  Exercise regularly.  Go to the bathroom when you have the urge to have a bowel movement. Do not wait.  Avoid straining to have bowel movements.  Keep the anal area dry and clean. Use wet toilet paper or moist towelettes after a bowel movement.  Medicated creams and  suppositories may be used or applied as directed.  Only take over-the-counter or prescription medicines as directed by your caregiver.  Take warm sitz baths for 15-20 minutes, 3-4 times a day to ease pain and discomfort.  Place ice packs on the hemorrhoids if they are tender and swollen. Using ice packs between sitz baths may be helpful.  Put ice in a plastic bag.  Place a towel between your skin and the bag.  Leave the ice on for 15-20 minutes, 3-4 times a day.  Do not use a donut-shaped pillow or sit on the toilet for long periods. This increases blood pooling and pain.  SEEK MEDICAL CARE IF: You have increasing pain and swelling that is not controlled by treatment or medicine. You have uncontrolled bleeding. You have difficulty or you are unable to have a bowel movement. You have pain or inflammation outside the area of the hemorrhoids. MAKE SURE YOU: Understand these  instructions. Will watch your condition. Will get help right away if you are not doing well or get worse.   This information is not intended to replace advice given to you by your health care provider. Make sure you discuss any questions you have with your health care provider.   Document Released: 09/23/2000 Document Revised: 09/12/2012 Document Reviewed: 07/31/2012 Elsevier Interactive Patient Education 2016 [place]. Gastroesophageal Reflux Disease, Adult Normally, food travels down the esophagus and stays in the stomach to be digested. However, when a person has gastroesophageal reflux disease (GERD), food and stomach acid move back up into the esophagus. When this happens, the esophagus becomes sore and inflamed. Over time, GERD can create small holes (ulcers) in the lining of the esophagus.  CAUSES This condition is caused by a problem with the muscle between the esophagus and the stomach (lower esophageal sphincter, or LES). Normally, the LES muscle closes after food passes through the esophagus to the  stomach. When the LES is weakened or abnormal, it does not close properly, and that allows food and stomach acid to go back up into the esophagus. The LES can be weakened by certain dietary substances, medicines, and medical conditions, including: Tobacco use. Pregnancy. Having a hiatal hernia. Heavy alcohol use. Certain foods and beverages, such as coffee, chocolate, onions, and peppermint. RISK FACTORS This condition is more likely to develop in: People who have an increased body weight. People who have connective tissue disorders. People who use NSAID medicines. SYMPTOMS Symptoms of this condition include: Heartburn. Difficult or painful swallowing. The feeling of having a lump in the throat. Abitter taste in the mouth. Bad breath. Having a large amount of saliva. Having an upset or bloated stomach. Belching. Chest pain. Shortness of breath or wheezing. Ongoing (chronic) cough or a night-time cough. Wearing away of tooth enamel. Weight loss. Different conditions can cause chest pain. Make sure to see your health care provider if you experience chest pain. DIAGNOSIS Your health care provider will take a medical history and perform a physical exam. To determine if you have mild or severe GERD, your health care provider may also monitor how you respond to treatment. You may also have other tests, including: An endoscopy toexamine your stomach and esophagus with a small camera. A test thatmeasures the acidity level in your esophagus. A test thatmeasures how much pressure is on your esophagus. A barium swallow or modified barium swallow to show the shape, size, and functioning of your esophagus. TREATMENT The goal of treatment is to help relieve your symptoms and to prevent complications. Treatment for this condition may vary depending on how severe your symptoms are. Your health care provider may recommend: Changes to your diet. Medicine. Surgery. HOME CARE  INSTRUCTIONS Diet Follow a diet as recommended by your health care provider. This may involve avoiding foods and drinks such as: Coffee and tea (with or without caffeine). Drinks that containalcohol. Energy drinks and sports drinks. Carbonated drinks or sodas. Chocolate and cocoa. Peppermint and mint flavorings. Garlic and onions. Horseradish. Spicy and acidic foods, including peppers, chili powder, curry powder, vinegar, hot sauces, and barbecue sauce. Citrus fruit juices and citrus fruits, such as oranges, lemons, and limes. Tomato-based foods, such as red sauce, chili, salsa, and pizza with red sauce. Fried and fatty foods, such as donuts, french fries, potato chips, and high-fat dressings. High-fat meats, such as hot dogs and fatty cuts of red and white meats, such as rib eye steak, sausage, ham, and bacon. High-fat dairy items,  such as whole milk, butter, and cream cheese. Eat small, frequent meals instead of large meals. Avoid drinking large amounts of liquid with your meals. Avoid eating meals during the 2-3 hours before bedtime. Avoid lying down right after you eat. Do not exercise right after you eat. General Instructions Pay attention to any changes in your symptoms. Take over-the-counter and prescription medicines only as told by your health care provider. Do not take aspirin, ibuprofen, or other NSAIDs unless your health care provider told you to do so. Do not use any tobacco products, including cigarettes, chewing tobacco, and e-cigarettes. If you need help quitting, ask your health care provider. Wear loose-fitting clothing. Do not wear anything tight around your waist that causes pressure on your abdomen. Raise (elevate) the head of your bed 6 inches (15cm). Try to reduce your stress, such as with yoga or meditation. If you need help reducing stress, ask your health care provider. If you are overweight, reduce your weight to an amount that is healthy for you. Ask your  health care provider for guidance about a safe weight loss goal. Keep all follow-up visits as told by your health care provider. This is important. SEEK MEDICAL CARE IF: You have new symptoms. You have unexplained weight loss. You have difficulty swallowing, or it hurts to swallow. You have wheezing or a persistent cough. Your symptoms do not improve with treatment. You have a hoarse voice. SEEK IMMEDIATE MEDICAL CARE IF: You have pain in your arms, neck, jaw, teeth, or back. You feel sweaty, dizzy, or light-headed. You have chest pain or shortness of breath. You vomit and your vomit looks like blood or coffee grounds. You faint. Your stool is bloody or black. You cannot swallow, drink, or eat.   This information is not intended to replace advice given to you by your health care provider. Make sure you discuss any questions you have with your health care provider.   Document Released: 07/06/2005 Document Revised: 06/17/2015 Document Reviewed: 01/21/2015 Elsevier Interactive Patient Education [employer].

## 2015-12-31 NOTE — Op Note (Signed)
[facility] Patient Name: Anthony Hensley Procedure Date: 12/31/2015 8:04 AM MRN: [id number] Date of Birth: Mar 28, 1961 Attending MD: Norvel Richards , MD CSN: [id number] Age: 55 Admit Type: Outpatient Procedure:                Colonoscopy Indications:              Hematochezia Providers:                Norvel Richards, MD, Lurline Del, RN, Georgeann Oppenheim, Technician Referring MD:             Dr. Hilma Favors, MD (Referring MD) Medicines:                Midazolam 5 mg IV, Meperidine 75 mg IV, Ondansetron                            4 mg IV Complications:            No immediate complications. Estimated Blood Loss:     Estimated blood loss was minimal. Procedure:                Pre-Anesthesia Assessment:                           - Prior to the procedure, a History and Physical                            was performed, and patient medications and                            allergies were reviewed. The patient's tolerance of                            previous anesthesia was also reviewed. The risks                            and benefits of the procedure and the sedation                            options and risks were discussed with the patient.                            All questions were answered, and informed consent                            was obtained. Prior Anticoagulants: The patient                            last took aspirin 1 day and Plavix (clopidogrel) 1                            day prior to the procedure. ASA Grade Assessment:  II - A patient with mild systemic disease. After                            reviewing the risks and benefits, the patient was                            deemed in satisfactory condition to undergo the                            procedure.                           After obtaining informed consent, the colonoscope                            was passed under direct vision. Throughout the                             procedure, the patient's blood pressure, pulse, and                            oxygen saturations were monitored continuously. The                            EC-[id number] scope was introduced through                            the anus and advanced to the the terminal ileum,                            with identification of the appendiceal orifice and                            IC valve. The colonoscopy was performed without                            difficulty. The patient tolerated the procedure                            well. The terminal ileum and the rectum were                            photographed. The quality of the bowel preparation                            was adequate. Scope In: 8:08:58 AM Scope Out: 8:24:46 AM Scope Withdrawal Time: 0 hours 13 minutes 47 seconds  Total Procedure Duration: 0 hours 15 minutes 48 seconds  Findings:      A 3 mm polyp was found in the splenic flexure. The polyp was       pedunculated. The polyp was removed with a hot snare. Resection and       retrieval were complete. Estimated blood loss: none.      A 9 mm polyp was found in the splenic flexure.  The polyp was       semi-pedunculated. The polyp was removed with a cold biopsy forceps.       Resection and retrieval were complete.      A 5 mm polyp was found in the sigmoid colon. The polyp was pedunculated.       The polyp was removed with a hot snare. Resection and retrieval were       complete.      The terminal ileum appeared normal. Estimated blood loss: none.      Multiple medium-mouthed diverticula were found in the sigmoid colon.      Non-bleeding internal hemorrhoids were found during retroflexion. The       hemorrhoids were medium-sized and Grade II (internal hemorrhoids that       prolapse but reduce spontaneously). Estimated blood loss was minimal. Impression:               - One polyp at the splenic flexure, removed with a                             hot snare. Resected and retrieved.                           - One polyp at the splenic flexure, removed with a                            cold biopsy forceps. Resected and retrieved.                           - One polyp in the sigmoid colon, removed with a                            hot snare. Resected and retrieved.                           - The examined portion of the ileum was normal.                           - Diverticulosis in the sigmoid colon.    I suspect                            rectal bleeding due to hemorrhoids. Patient may be                            a candidate for banding.                           - Non-bleeding internal hemorrhoids. Moderate Sedation:      Moderate (conscious) sedation was administered by the endoscopy nurse       and supervised by the endoscopist. The following parameters were       monitored: oxygen saturation, heart rate, blood pressure, respiratory       rate, EKG, adequacy of pulmonary ventilation, and response to care.       Total physician intraservice time was 40 minutes. Recommendation:           - Advance diet as tolerated today.                           -  Continue present medications. See EGD report.                           - Await pathology results.                           - Repeat colonoscopy date to be determined after                            pending pathology results are reviewed for                            surveillance based on pathology results.                           - Discharge patient to home.                           - Patient has a contact number available for                            emergencies. The signs and symptoms of potential                            delayed complications were discussed with the                            patient. Return to normal activities tomorrow.                            Written discharge instructions were provided to the                            patient. Procedure  Code(s):        --- Professional ---                           [phone], Colonoscopy, flexible; with removal of                            tumor(s), polyp(s), or other lesion(s) by snare                            technique                           45380, 59, Colonoscopy, flexible; with biopsy,                            single or multiple                           99152, Moderate sedation services provided by the                            same physician or other qualified health care  professional performing the diagnostic or                            therapeutic service that the sedation supports,                            requiring the presence of an independent trained                            observer to assist in the monitoring of the                            patient's level of consciousness and physiological                            status; initial 15 minutes of intraservice time,                            patient age 50 years or older                           [phone], Moderate sedation services; each additional                            15 minutes intraservice time                           [phone], Moderate sedation services; each additional                            15 minutes intraservice time Diagnosis Code(s):        --- Professional ---                           D12.3, Benign neoplasm of transverse colon (hepatic                            flexure or splenic flexure)                           D12.5, Benign neoplasm of sigmoid colon                           K64.1, Second degree hemorrhoids                           K92.1, Melena (includes Hematochezia)                           K57.30, Diverticulosis of large intestine without                            perforation or abscess without bleeding CPT copyright 2016 American Medical Association. All rights reserved. The codes documented in this report are preliminary and upon coder review may  be  revised to meet current compliance requirements. Cristopher Estimable. Gala Romney, MD  Norvel Richards, MD 12/31/2015 9:06:17 AM This report has been signed electronically. Number of Addenda: 0

## 2016-01-01 ENCOUNTER — Encounter: Payer: Self-pay | Admitting: Internal Medicine

## 2016-01-06 ENCOUNTER — Encounter (HOSPITAL_COMMUNITY): Payer: Self-pay | Admitting: Internal Medicine

## 2016-01-06 ENCOUNTER — Other Ambulatory Visit: Payer: Self-pay | Admitting: Cardiology

## 2016-01-06 NOTE — Telephone Encounter (Signed)
REFILL 

## 2016-01-07 ENCOUNTER — Other Ambulatory Visit: Payer: Self-pay

## 2016-01-08 ENCOUNTER — Other Ambulatory Visit: Payer: Self-pay | Admitting: Gastroenterology

## 2016-01-11 ENCOUNTER — Other Ambulatory Visit: Payer: Self-pay | Admitting: Cardiology

## 2016-01-19 ENCOUNTER — Encounter: Payer: Self-pay | Admitting: Internal Medicine

## 2016-01-19 ENCOUNTER — Ambulatory Visit (INDEPENDENT_AMBULATORY_CARE_PROVIDER_SITE_OTHER): Payer: BLUE CROSS/BLUE SHIELD | Admitting: Internal Medicine

## 2016-01-19 VITALS — BP 124/83 | HR 72 | Temp 98.5°F | Ht 68.0 in | Wt 163.4 lb

## 2016-01-19 DIAGNOSIS — K2100 Gastro-esophageal reflux disease with esophagitis, without bleeding: Secondary | ICD-10-CM

## 2016-01-19 DIAGNOSIS — K5909 Other constipation: Secondary | ICD-10-CM

## 2016-01-19 DIAGNOSIS — K648 Other hemorrhoids: Secondary | ICD-10-CM

## 2016-01-19 NOTE — Progress Notes (Signed)
Primary Care Physician:  [name] Primary Gastroenterologist:  Dr. Gala Romney  Pre-Procedure History & Physical: HPI:  Anthony Hensley is a 55 y.o. male here for follow-up of GERD/dysphagia/constipation rectal bleeding. Recent EGD and colonoscopy demonstrated  hemorrhoids and a serrated adenoma-removed.   Protonix 40 mg daily as only mildly blunted reflux symptoms. Still perceives some difficulty swallowing. Occasionally constipated. No fiber supplement. No laxatives. Not an ideal hemorrhoid banding candidate due to ongoing aspirin/ Plavix therapy-compulsory.  Past Medical History  Diagnosis Date  . Hypertension   . Hypercholesterolemia   . CAD (coronary artery disease)   . Diverticulosis     Past Surgical History  Procedure Laterality Date  . Cardiac catheterization N/A 07/13/2015    Procedure: Left Heart Cath and Coronary Angiography;  Surgeon: Burnell Blanks, MD;  Location: [place] [facility];  Service: Cardiovascular;  Laterality: N/A;  . Cardiac catheterization N/A 07/13/2015    PCI + DES to the mid circ. LVEF was normal at 65%  . Colonoscopy N/A 12/31/2015    Dr.Reinhart Saulters- diverticulosis,22mm polyp in the splenic flexure, 20mm polyp in the splenic flexure, 13mm polyp in the sigmoid colon bx= traditional serrated adenoma  . Esophagogastroduodenoscopy N/A 12/31/2015    Dr.Obediah Welles- esophagitis with no bleeding, diffuse moderately erythematous mucosa without bleeding was found in the entire examined stomach. stomach bx= slight chronic inflammation. esophagus bx= benign gastresophageal junction mucosa  . Maloney dilation N/A 12/31/2015    Procedure: Venia Minks DILATION;  Surgeon: [name], MD;  [facility];  Service: Endoscopy;  Laterality: N/A;    Prior to Admission medications   Medication Sig Start Date End Date Taking? Authorizing Provider  aspirin 81 MG EC tablet Take 1 tablet (81 mg total) by mouth daily. 07/14/15  Yes [name], PA-C  atorvastatin  (LIPITOR) 80 MG tablet TAKE 1 TABLET BY MOUTH EVERY DAY AT 6 PM 01/12/16  Yes [name], NP  clopidogrel (PLAVIX) 75 MG tablet TAKE 1 TABLET BY MOUTH EVERY DAY WITH breakfast 01/06/16  Yes Brittainy M Simmons, PA-C  enalapril (VASOTEC) 10 MG tablet TAKE 1 TABLET BY MOUTH EVERY DAY 12/15/15  Yes [name], NP  Glycerin-Hypromellose-PEG 400 (VISINE TEARS OP) Apply 1 drop to eye daily.   Yes Historical Provider, MD  hyoscyamine (LEVSIN SL) 0.125 MG SL tablet Place 1 tablet (0.125 mg total) under the tongue every 4 (four) hours as needed. 11/27/15  Yes Orvil Feil, NP  metoprolol tartrate (LOPRESSOR) 25 MG tablet Take 0.5 tablets (12.5 mg total) by mouth 2 (two) times daily. 07/14/15  Yes [name], PA-C  pantoprazole (PROTONIX) 40 MG tablet TAKE 1 TABLET BY MOUTH EVERY DAY 01/06/16  Yes [name], PA-C  sucralfate (CARAFATE) 1 g tablet TAKE 1 TABLET BY MOUTH 4 TIMES DAILY 01/11/16  Yes Orvil Feil, NP  polyethylene glycol-electrolytes (TRILYTE) 420 g solution Take 4,000 mLs by mouth as directed. Patient not taking: Reported on 01/19/2016 11/27/15   [name], MD  sucralfate (CARAFATE) 1 GM/10ML suspension Take 10 mLs (1 g total) by mouth 4 (four) times daily. Patient not taking: Reported on 01/19/2016 11/27/15   Orvil Feil, NP    Allergies as of 01/19/2016  . (No Known Allergies)    Family History  Problem Relation Age of Onset  . Heart attack Father 28    Deceased  . Colon cancer Neg Hx     Social History   Social History  .  Marital Status: Single    Spouse Name: N/A  . Number of Children: N/A  . Years of Education: N/A   Occupational History  . Machine Teacher, English as a foreign language And [employer]   Social History Main Topics  . Smoking status: Current Some Day Smoker -- 0.75 packs/day for 35 years    Types: Cigarettes  . Smokeless tobacco: Never Used  . Alcohol Use: No  . Drug Use: No  . Sexual Activity: Not on file   Other Topics Concern  .  Not on file   Social History Narrative    Review of Systems: See HPI, otherwise negative ROS  Physical Exam: BP 124/83 mmHg  Pulse 72  Temp(Src) 98.5 F (36.9 C)  Ht 5\' 8"  (1.727 m)  Wt 163 lb 6.4 oz (74.118 kg)  BMI 24.85 kg/m2 General:   Alert,  , pleasant and cooperative in NAD;   Skin:  Intact without significant lesions or rashes. Eyes:  Sclera clear, no icterus.   Conjunctiva pink. Neck:  Supple; no masses or thyromegaly. No significant cervical adenopathy. Lungs:  Clear throughout to auscultation.   No wheezes, crackles, or rhonchi. No acute distress. Heart:  Regular rate and rhythm; no murmurs, clicks, rubs,  or gallops. Abdomen: Non-distended, normal bowel sounds.  Soft and nontender without appreciable mass or hepatosplenomegaly.  Pulses:  Normal pulses noted. Extremities:  Without clubbing or edema.  Impression:  Pleasant 55 year old gentleman with erosive reflux esophagitis;  history dysphagia - status post empiric passage of a Maloney dilator. Still with some persisting symptoms. Inadequate clinical improvement on Protonix. Intermittent constipation. Intermittent rectal bleeding likely secondary to hemorrhoids. History serrated adenoma removed   Recommendations:  Stop protonix; begin Dexilant 60 mg daily  Begin Colace 100 mg twice daily (stool softener)  Begin Benefiber 1 tablespoon twice daily  Miralax 17 grams once daily as needed for constipation  Due for surveillance colonoscopy in 5 years.  Office visit in 1 month     Notice: This dictation was prepared with Dragon dictation along with smaller phrase technology. Any transcriptional errors that result from this process are unintentional and may not be corrected upon review.

## 2016-01-19 NOTE — Patient Instructions (Signed)
Stop protonix; begin Dexilant 60 mg daily  Begin Colace 100 mg twice daily (stool softener)  Begin Benefiber 1 tablespoon twice daily  Miralax 17 grams once daily as needed for constipation  Office visit in 1 month

## 2016-02-12 ENCOUNTER — Telehealth: Payer: Self-pay | Admitting: Internal Medicine

## 2016-02-12 MED ORDER — DEXLANSOPRAZOLE 60 MG PO CPDR: 60.0000 mg | DELAYED_RELEASE_CAPSULE | Freq: Every day | ORAL | Status: DC

## 2016-02-12 NOTE — Addendum Note (Signed)
Addended by: Orvil Feil on: 02/12/2016 09:27 AM   Modules accepted: Orders

## 2016-02-12 NOTE — Telephone Encounter (Signed)
Routing to the refill box. 

## 2016-02-12 NOTE — Telephone Encounter (Signed)
Completed.

## 2016-02-12 NOTE — Telephone Encounter (Signed)
Patient wife called and requested that dexilant be called into [facility] [place] [phone], samples he was given last office visit worked very well

## 2016-02-19 ENCOUNTER — Ambulatory Visit (INDEPENDENT_AMBULATORY_CARE_PROVIDER_SITE_OTHER): Payer: BLUE CROSS/BLUE SHIELD | Admitting: Internal Medicine

## 2016-02-19 ENCOUNTER — Encounter: Payer: Self-pay | Admitting: Internal Medicine

## 2016-02-19 VITALS — BP 106/73 | HR 55 | Temp 98.3°F | Ht 65.0 in | Wt 164.4 lb

## 2016-02-19 DIAGNOSIS — K5909 Other constipation: Secondary | ICD-10-CM

## 2016-02-19 DIAGNOSIS — K2100 Gastro-esophageal reflux disease with esophagitis, without bleeding: Secondary | ICD-10-CM

## 2016-02-19 DIAGNOSIS — K921 Melena: Secondary | ICD-10-CM | POA: Diagnosis not present

## 2016-02-19 NOTE — Patient Instructions (Signed)
Begin Colace 100mg  tice daily to soften stool  Dexilant samples today  We will check with insurance company to find an alternative to protonix   Office visit in 3 months

## 2016-02-19 NOTE — Progress Notes (Signed)
Primary Care Physician:  [name] Primary Gastroenterologist:  Dr. Gala Romney  Pre-Procedure History & Physical: HPI:  Anthony Hensley is a 55 y.o. male here for follow-up of reflux esophagitis and hematochezia. Recent colonoscopy demonstrated hemorrhoids as cause of bleeding. Serrated adenoma removed; due for surveillance 2022. Dysphagia resolved status post Maloney dilation.  Dexilant worked great for reflux however out of pocket expense $400 monthly. He pointed out he gets all of his other medications for about $40 a month. Protonix previously ineffective in controlling his reflux. He continues on Plavix. Has 1-2 bowel movements daily sometimes pass a hard stool in spite of taking Benefiber 1 tablespoon twice daily. He's not on the stool softener.  Takes Carafate only occasionally.   Past Medical History  Diagnosis Date  . Hypertension   . Hypercholesterolemia   . CAD (coronary artery disease)   . Diverticulosis     Past Surgical History  Procedure Laterality Date  . Cardiac catheterization N/A 07/13/2015    Procedure: Left Heart Cath and Coronary Angiography;  Surgeon: Burnell Blanks, MD;  Location: [place] [facility];  Service: Cardiovascular;  Laterality: N/A;  . Cardiac catheterization N/A 07/13/2015    PCI + DES to the mid circ. LVEF was normal at 65%  . Colonoscopy N/A 12/31/2015    Dr.Lynae Pederson- diverticulosis,15mm polyp in the splenic flexure, 75mm polyp in the splenic flexure, 35mm polyp in the sigmoid colon bx= traditional serrated adenoma  . Esophagogastroduodenoscopy N/A 12/31/2015    Dr.Judge Duque- esophagitis with no bleeding, diffuse moderately erythematous mucosa without bleeding was found in the entire examined stomach. stomach bx= slight chronic inflammation. esophagus bx= benign gastresophageal junction mucosa  . Maloney dilation N/A 12/31/2015    Procedure: Venia Minks DILATION;  Surgeon: [name], MD;  [facility];  Service: Endoscopy;  Laterality:  N/A;    Prior to Admission medications   Medication Sig Start Date End Date Taking? Authorizing Provider  aspirin 81 MG EC tablet Take 1 tablet (81 mg total) by mouth daily. 07/14/15  Yes [name], PA-C  atorvastatin (LIPITOR) 80 MG tablet TAKE 1 TABLET BY MOUTH EVERY DAY AT 6 PM 01/12/16  Yes [name], NP  clopidogrel (PLAVIX) 75 MG tablet TAKE 1 TABLET BY MOUTH EVERY DAY WITH breakfast 01/06/16  Yes Brittainy M Simmons, PA-C  enalapril (VASOTEC) 10 MG tablet TAKE 1 TABLET BY MOUTH EVERY DAY 12/15/15  Yes [name], NP  Glycerin-Hypromellose-PEG 400 (VISINE TEARS OP) Apply 1 drop to eye daily.   Yes Historical Provider, MD  hyoscyamine (LEVSIN SL) 0.125 MG SL tablet Place 1 tablet (0.125 mg total) under the tongue every 4 (four) hours as needed. 11/27/15  Yes Orvil Feil, NP  metoprolol tartrate (LOPRESSOR) 25 MG tablet Take 0.5 tablets (12.5 mg total) by mouth 2 (two) times daily. 07/14/15  Yes [name], PA-C  pantoprazole (PROTONIX) 40 MG tablet TAKE 1 TABLET BY MOUTH EVERY DAY 01/06/16  Yes [name], PA-C  dexlansoprazole (DEXILANT) 60 MG capsule Take 1 capsule (60 mg total) by mouth daily. Patient not taking: Reported on 02/19/2016 02/12/16   Orvil Feil, NP  polyethylene glycol-electrolytes (TRILYTE) 420 g solution Take 4,000 mLs by mouth as directed. Patient not taking: Reported on 01/19/2016 11/27/15   [name], MD  sucralfate (CARAFATE) 1 g tablet TAKE 1 TABLET BY MOUTH 4 TIMES DAILY Patient not taking: Reported on 02/19/2016 01/11/16   Orvil Feil, NP  sucralfate ([place])  1 GM/10ML suspension Take 10 mLs (1 g total) by mouth 4 (four) times daily. Patient not taking: Reported on 01/19/2016 11/27/15   Orvil Feil, NP    Allergies as of 02/19/2016  . (No Known Allergies)    Family History  Problem Relation Age of Onset  . Heart attack Father 39    Deceased  . Colon cancer Neg Hx     Social History   Social History  . Marital Status:  Single    Spouse Name: N/A  . Number of Children: N/A  . Years of Education: N/A   Occupational History  . Machine Teacher, English as a foreign language And [employer]   Social History Main Topics  . Smoking status: Current Some Day Smoker -- 0.75 packs/day for 35 years    Types: Cigarettes  . Smokeless tobacco: Never Used  . Alcohol Use: No  . Drug Use: No  . Sexual Activity: Not on file   Other Topics Concern  . Not on file   Social History Narrative    Review of Systems: See HPI, otherwise negative ROS  Physical Exam: BP 106/73 mmHg  Pulse 55  Temp(Src) 98.3 F (36.8 C) (Oral)  Ht 5\' 5"  (1.651 m)  Wt 164 lb 6.4 oz (74.571 kg)  BMI 27.36 kg/m2 General:   Alert,  Somewhat poorly kept pleasant and cooperative in NAD Skin:  Intact without significant lesions or rashes. Neck:  Supple; no masses or thyromegaly. No significant cervical adenopathy. Lungs:  Clear throughout to auscultation.   No wheezes, crackles, or rhonchi. No acute distress. Heart:  Regular rate and rhythm; no murmurs, clicks, rubs,  or gallops. Abdomen: nondistended. Soft and nontender without appreciable mass or organomegaly Pulses:  Normal pulses noted. Extremities:  Without clubbing or edema.  Impression:  History of GERD/reflux esophagitis -  well controlled on Dexilant transiently. He ran out one week ago and his reflux symptoms now back to his typical baseline of having symptoms almost every day. No dysphagia. Intermittent constipation  -  likely culprit exacerbating hemorrhoid bleeding. I pointed out the chronic nature reflux and the need to take acid suppression therapy long-term every day. Most of his reflux symptoms occur in the evening.  Will determine which PPI he can get a reasonable cost and have him take it later each day.  Recommendations: Begin Colace 100mg  twice daily to soften stool.  May use Miralax daily as well prn constipation  Dexilant samples today.  Will plan on dosing PPI mid day with  Plavix taken first thing each Morning.  We will check with insurance company to find an alternative to protonix       Office visit in 3 months   Notice: This dictation was prepared with Dragon dictation along with smaller phrase technology. Any transcriptional errors that result from this process are unintentional and may not be corrected upon review.

## 2016-02-22 ENCOUNTER — Telehealth: Payer: Self-pay

## 2016-02-22 ENCOUNTER — Other Ambulatory Visit: Payer: Self-pay | Admitting: Gastroenterology

## 2016-02-22 MED ORDER — RABEPRAZOLE SODIUM 20 MG PO TBEC: 20.0000 mg | DELAYED_RELEASE_TABLET | Freq: Every day | ORAL | Status: DC

## 2016-02-22 NOTE — Telephone Encounter (Signed)
He can try rabeprazole-AcipHex or Zegerid 20 mg or 40 mg, respectively, daily if not intolerant to or previously tried and failed. Either way dispensed 30 with 11 refills

## 2016-02-22 NOTE — Telephone Encounter (Signed)
Tried to call pt- NA- LMOM informing him that we sent in new rx.

## 2016-02-22 NOTE — Telephone Encounter (Signed)
I have sent in aciphex.

## 2016-02-22 NOTE — Telephone Encounter (Signed)
Pt called back and it aware.

## 2016-02-22 NOTE — Telephone Encounter (Signed)
PA was approved for dexilant, received a fax from the pharmacy, even with PA approved the cost is still $253.00 a month. Pt would like to try something else. He has only tried protonix in the past.  They want to know if we can send in something else?

## 2016-02-25 ENCOUNTER — Ambulatory Visit (INDEPENDENT_AMBULATORY_CARE_PROVIDER_SITE_OTHER): Payer: BLUE CROSS/BLUE SHIELD | Admitting: Adult Health

## 2016-02-25 ENCOUNTER — Encounter: Payer: Self-pay | Admitting: Adult Health

## 2016-02-25 VITALS — BP 126/80 | HR 74 | Ht 67.0 in | Wt 165.0 lb

## 2016-02-25 DIAGNOSIS — I2510 Atherosclerotic heart disease of native coronary artery without angina pectoris: Secondary | ICD-10-CM

## 2016-02-25 DIAGNOSIS — Z720 Tobacco use: Secondary | ICD-10-CM | POA: Diagnosis not present

## 2016-02-25 DIAGNOSIS — Z79899 Other long term (current) drug therapy: Secondary | ICD-10-CM

## 2016-02-25 NOTE — Patient Instructions (Signed)
Medication Instructions:  Your physician recommends that you continue on your current medications as directed. Please refer to the Current Medication list given to you today.   Labwork: Your physician recommends that you return for lab work in: asap bmet Cbc magnesium   Testing/Procedures: none  Follow-Up: Your physician wants you to follow-up in: 1 year.  You will receive a reminder letter in the mail two months in advance. If you don't receive a letter, please call our office to schedule the follow-up appointment.   Any Other Special Instructions Will Be Listed Below (If Applicable).     If you need a refill on your cardiac medications before your next appointment, please call your pharmacy.

## 2016-02-25 NOTE — Progress Notes (Deleted)
Name: Anthony Hensley    DOB: 12-Feb-1961  Age: 55 y.o.  MR#: [id number]       PCP:  Anthony Hensley      Insurance: Payor: [employer] [place] / Plan: BCBS OTHER / Product Type: *No Product type* /   CC:   No chief complaint on file.   VS Filed Vitals:   02/25/16 1449  BP: 126/80  Pulse: 74  Height: 5\' 7"  (1.702 m)  Weight: 165 lb (74.844 kg)  SpO2: 94%    Weights Current Weight  02/25/16 165 lb (74.844 kg)  02/19/16 164 lb 6.4 oz (74.571 kg)  01/19/16 163 lb 6.4 oz (74.118 kg)    Blood Pressure  BP Readings from Last 3 Encounters:  02/25/16 126/80  02/19/16 106/73  01/19/16 124/83     Admit date:  (Not on file) Last encounter with RMR:  Visit date not found   Allergy Review of patient's allergies indicates no known allergies.  Current Outpatient Prescriptions  Medication Sig Dispense Refill  . aspirin 81 MG EC tablet Take 1 tablet (81 mg total) by mouth daily. 30 tablet   . atorvastatin (LIPITOR) 80 MG tablet TAKE 1 TABLET BY MOUTH EVERY DAY AT 6 PM 30 tablet 6  . clopidogrel (PLAVIX) 75 MG tablet TAKE 1 TABLET BY MOUTH EVERY DAY WITH breakfast 30 tablet 1  . dexlansoprazole (DEXILANT) 60 MG capsule Take 1 capsule (60 mg total) by mouth daily. 90 capsule 3  . enalapril (VASOTEC) 10 MG tablet TAKE 1 TABLET BY MOUTH EVERY DAY 30 tablet 6  . Glycerin-Hypromellose-PEG 400 (VISINE TEARS OP) Apply 1 drop to eye daily.    . hyoscyamine (LEVSIN SL) 0.125 MG SL tablet Place 1 tablet (0.125 mg total) under the tongue every 4 (four) hours as needed. 60 tablet 3  . metoprolol tartrate (LOPRESSOR) 25 MG tablet Take 0.5 tablets (12.5 mg total) by mouth 2 (two) times daily. 60 tablet 5  . pantoprazole (PROTONIX) 40 MG tablet TAKE 1 TABLET BY MOUTH EVERY DAY 30 tablet 1  . polyethylene glycol-electrolytes (TRILYTE) 420 g solution Take 4,000 mLs by mouth as directed. 4000 mL 0  . RABEprazole (ACIPHEX) 20 MG tablet Take 1 tablet (20 mg total) by mouth daily. 30 tablet 11  .  sucralfate (CARAFATE) 1 g tablet TAKE 1 TABLET BY MOUTH 4 TIMES DAILY 120 tablet 0   No current facility-administered medications for this visit.    Discontinued Meds:    Medications Discontinued During This Encounter  Medication Reason  . sucralfate (CARAFATE) 1 GM/10ML suspension Error    Patient Active Problem List   Diagnosis Date Noted  . Mucosal abnormality of stomach   . Mucosal abnormality of esophagus   . History of colonic polyps   . Diverticulosis of colon without hemorrhage   . Dysphagia 11/27/2015  . Rectal bleeding 11/27/2015  . Abdominal pain 11/27/2015  . Tobacco abuse 07/27/2015  . Chest pain at rest   . NSTEMI (non-ST elevated myocardial infarction) ([place])   . ACS (acute coronary syndrome) ([place]) 07/12/2015  . Hyperlipidemia 07/12/2015  . Chest pain 07/11/2015  . Essential hypertension 07/11/2015  . LUMBAR SPRAIN AND STRAIN 06/08/2009    LABS    Component Value Date/Time   NA 137 11/09/2015 2121   NA 136 07/14/2015 0433   NA 139 07/13/2015 0628   K 3.7 11/09/2015 2121   K 4.0 07/14/2015 0433   K 4.2 07/13/2015 0628   CL 102 11/09/2015 2121  CL 104 07/14/2015 0433   CL 104 07/13/2015 0628   CO2 27 11/09/2015 2121   CO2 24 07/14/2015 0433   CO2 29 07/13/2015 0628   GLUCOSE 88 11/09/2015 2121   GLUCOSE 105* 07/14/2015 0433   GLUCOSE 103* 07/13/2015 0628   BUN 19 11/09/2015 2121   BUN 22* 07/14/2015 0433   BUN 18 07/13/2015 0628   CREATININE 0.92 11/09/2015 2121   CREATININE 1.13 07/14/2015 0433   CREATININE 0.88 07/13/2015 0628   CALCIUM 9.4 11/09/2015 2121   CALCIUM 9.0 07/14/2015 0433   CALCIUM 8.9 07/13/2015 0628   GFRNONAA >60 11/09/2015 2121   GFRNONAA >60 07/14/2015 0433   GFRNONAA >60 07/13/2015 0628   GFRAA >60 11/09/2015 2121   GFRAA >60 07/14/2015 0433   GFRAA >60 07/13/2015 0628   CMP     Component Value Date/Time   NA 137 11/09/2015 2121   K 3.7 11/09/2015 2121   CL 102 11/09/2015 2121   CO2 27 11/09/2015 2121   GLUCOSE  88 11/09/2015 2121   BUN 19 11/09/2015 2121   CREATININE 0.92 11/09/2015 2121   CALCIUM 9.4 11/09/2015 2121   PROT 7.3 11/09/2015 2121   ALBUMIN 4.2 11/09/2015 2121   AST 28 11/09/2015 2121   ALT 31 11/09/2015 2121   ALKPHOS 80 11/09/2015 2121   BILITOT 0.5 11/09/2015 2121   GFRNONAA >60 11/09/2015 2121   GFRAA >60 11/09/2015 2121       Component Value Date/Time   WBC 6.0 11/09/2015 2121   WBC 6.8 07/14/2015 0433   WBC 5.9 07/13/2015 0628   HGB 15.8 11/09/2015 2121   HGB 15.6 07/14/2015 0433   HGB 15.6 07/13/2015 0628   HCT 46.9 11/09/2015 2121   HCT 46.1 07/14/2015 0433   HCT 46.2 07/13/2015 0628   MCV 91.6 11/09/2015 2121   MCV 92.0 07/14/2015 0433   MCV 92.8 07/13/2015 0628    Lipid Panel     Component Value Date/Time   CHOL 209* 07/12/2015 0615   TRIG 126 07/12/2015 0615   HDL 34* 07/12/2015 0615   CHOLHDL 6.1 07/12/2015 0615   VLDL 25 07/12/2015 0615   LDLCALC 150* 07/12/2015 0615    ABG No results found for: PHART, PCO2ART, PO2ART, HCO3, TCO2, ACIDBASEDEF, O2SAT   No results found for: TSH BNP (last 3 results) No results for input(s): BNP in the last 8760 hours.  ProBNP (last 3 results) No results for input(s): PROBNP in the last 8760 hours.  Cardiac Panel (last 3 results) No results for input(s): CKTOTAL, CKMB, TROPONINI, RELINDX in the last 72 hours.  Iron/TIBC/Ferritin/ %Sat No results found for: IRON, TIBC, FERRITIN, IRONPCTSAT   EKG Orders placed or performed during the hospital encounter of 11/09/15  . ED EKG  . ED EKG  . EKG 12-Lead  . EKG 12-Lead  . EKG     Prior Assessment and Plan Problem List as of 02/25/2016      Cardiovascular and Mediastinum   Essential hypertension   ACS (acute coronary syndrome) [facility])   NSTEMI (non-ST elevated myocardial infarction) [facility])     Digestive   Dysphagia   Last Assessment & Plan 11/27/2015 Office Visit Written 11/27/2015 11:04 AM by Orvil Feil, NP    Solid food dysphagia since Oct 2016, better  with Protonix daily. However, dysphagia still persists. Likely related to uncontrolled GERD, web, ring, stricture. Will need EGD with dilation at time of colonoscopy.   Proceed with upper endoscopy and dilation in the near future with Dr.  Rourk. The risks, benefits, and alternatives have been discussed in detail with patient. They have stated understanding and desire to proceed.  Continue Protonix once daily.  Carafate short-course      Rectal bleeding   Last Assessment & Plan 11/27/2015 Office Visit Written 11/27/2015 11:02 AM by Orvil Feil, NP    Likely benign in setting of recent illness. Proceed with colonoscopy as planned for screening purposes. CBC normal.       Mucosal abnormality of stomach   Mucosal abnormality of esophagus   Diverticulosis of colon without hemorrhage     Musculoskeletal and Integument   LUMBAR SPRAIN AND STRAIN     Other   Chest pain   Hyperlipidemia   Chest pain at rest   Tobacco abuse   Abdominal pain   Last Assessment & Plan 11/27/2015 Office Visit Edited 11/27/2015 11:04 AM by Orvil Feil, NP    55 year old male with acute onset of pain and diarrhea in Jan 2017, presenting to the ED with unremarkable labs and CT. Diarrhea has improved since this time, and he has days of no bowel movements. May have had self-limiting infectious process now with post-infectious symptoms. Low-volume hematochezia may be benign in setting of recent diarrhea illness but unable to exclude occult etiology. As diarrhea has improved and he has days without bowel movements, will hold on stool studies. If any recurrence, will obtain stool studies ASAP. As he has never had a colonoscopy, will proceed with this in the near future for further evaluation. As of note, takes Ibuprofen prn, which he has been asked to hold for now.   Proceed with TCS with Dr. Gala Romney in near future: the risks, benefits, and alternatives have been discussed with the patient in detail. The patient states understanding  and desires to proceed. Levsin prn abdominal cramping As of note, recent cardiac stent place Oct 2016. HE IS ON PLAVIX. Discussed with cardiology, Jory Sims, NP, who stated no contraindication for procedure.       History of colonic polyps       Imaging: No results found.

## 2016-02-25 NOTE — Progress Notes (Signed)
Cardiology Office Note   Date:  02/25/2016   ID:  Anthony Hensley, DOB 1960/11/01, MRN [id number]  PCP:  [name]  Cardiologist: Woodroe Chen, NP   No chief complaint on file.     History of Present Illness: Anthony Hensley is a 55 y.o. male who presents for I will reassess and management of coronary artery disease, with successful PCI and drug-eluting stent to the mid circumflex and November of 2016.she had mild nonobstructive residual disease in other vessels. He was last seen in the office in November of 2016 post hospitalization and was without complaint. He continued to smoke, and was given smoking cessation counseling.  And he is here today without any cardiac complaints. He is being followed by GI with multiple medication changes. He had a colonoscopy that revealed a precancerous polyp which was removed. He said no complaints of chest pain dyspnea on exertion, or fatigue.  Past Medical History  Diagnosis Date  . Hypertension   . Hypercholesterolemia   . CAD (coronary artery disease)   . Diverticulosis     Past Surgical History  Procedure Laterality Date  . Cardiac catheterization N/A 07/13/2015    Procedure: Left Heart Cath and Coronary Angiography;  Surgeon: Burnell Blanks, MD;  Location: [place] [facility];  Service: Cardiovascular;  Laterality: N/A;  . Cardiac catheterization N/A 07/13/2015    PCI + DES to the mid circ. LVEF was normal at 65%  . Colonoscopy N/A 12/31/2015    Dr.Rourk- diverticulosis,56mm polyp in the splenic flexure, 71mm polyp in the splenic flexure, 20mm polyp in the sigmoid colon bx= traditional serrated adenoma  . Esophagogastroduodenoscopy N/A 12/31/2015    Dr.Rourk- esophagitis with no bleeding, diffuse moderately erythematous mucosa without bleeding was found in the entire examined stomach. stomach bx= slight chronic inflammation. esophagus bx= benign gastresophageal junction mucosa  . Maloney dilation N/A 12/31/2015   Procedure: Venia Minks DILATION;  Surgeon: [name], MD;  [facility];  Service: Endoscopy;  Laterality: N/A;     Current Outpatient Prescriptions  Medication Sig Dispense Refill  . aspirin 81 MG EC tablet Take 1 tablet (81 mg total) by mouth daily. 30 tablet   . atorvastatin (LIPITOR) 80 MG tablet TAKE 1 TABLET BY MOUTH EVERY DAY AT 6 PM 30 tablet 6  . clopidogrel (PLAVIX) 75 MG tablet TAKE 1 TABLET BY MOUTH EVERY DAY WITH breakfast 30 tablet 1  . dexlansoprazole (DEXILANT) 60 MG capsule Take 1 capsule (60 mg total) by mouth daily. 90 capsule 3  . enalapril (VASOTEC) 10 MG tablet TAKE 1 TABLET BY MOUTH EVERY DAY 30 tablet 6  . Glycerin-Hypromellose-PEG 400 (VISINE TEARS OP) Apply 1 drop to eye daily.    . hyoscyamine (LEVSIN SL) 0.125 MG SL tablet Place 1 tablet (0.125 mg total) under the tongue every 4 (four) hours as needed. 60 tablet 3  . metoprolol tartrate (LOPRESSOR) 25 MG tablet Take 0.5 tablets (12.5 mg total) by mouth 2 (two) times daily. 60 tablet 5  . pantoprazole (PROTONIX) 40 MG tablet TAKE 1 TABLET BY MOUTH EVERY DAY 30 tablet 1  . polyethylene glycol-electrolytes (TRILYTE) 420 g solution Take 4,000 mLs by mouth as directed. 4000 mL 0  . RABEprazole (ACIPHEX) 20 MG tablet Take 1 tablet (20 mg total) by mouth daily. 30 tablet 11  . sucralfate (CARAFATE) 1 g tablet TAKE 1 TABLET BY MOUTH 4 TIMES DAILY 120 tablet 0   No current facility-administered medications for this visit.  Allergies:   Review of patient's allergies indicates no known allergies.    Social History:  The patient  reports that he has been smoking Cigarettes.  He has a 26.25 pack-year smoking history. He has never used smokeless tobacco. He reports that he does not drink alcohol or use illicit drugs.   Family History:  The patient's family history includes Heart attack (age of onset: 5) in his father. There is no history of Colon cancer.    ROS: All other systems are reviewed and negative.  Unless otherwise mentioned in H&P    PHYSICAL EXAM: VS:  BP 126/80 mmHg  Pulse 74  Ht 5\' 7"  (1.702 m)  Wt 165 lb (74.844 kg)  BMI 25.84 kg/m2  SpO2 94% , BMI Body mass index is 25.84 kg/(m^2). GEN: Well nourished, well developed, in no acute distress HEENT: normal Neck: no JVD, carotid bruits, or masses Cardiac: RRR; no murmurs, rubs, or gallops,no edema  Respiratory:   Clear to auscultation bilaterally, normal work of breathing, some bibasilar crackles. GI: soft, nontender, nondistended, + BS MS: no deformity or atrophy Skin: warm and dry, no rash Neuro:  Strength and sensation are intact Psych: euthymic mood, full affect   Recent Labs: 11/09/2015: ALT 31; BUN 19; Creatinine, Ser 0.92; Hemoglobin 15.8; Platelets 205; Potassium 3.7; Sodium 137    Lipid Panel    Component Value Date/Time   CHOL 209* 07/12/2015 0615   TRIG 126 07/12/2015 0615   HDL 34* 07/12/2015 0615   CHOLHDL 6.1 07/12/2015 0615   VLDL 25 07/12/2015 0615   LDLCALC 150* 07/12/2015 0615      Wt Readings from Last 3 Encounters:  02/25/16 165 lb (74.844 kg)  02/19/16 164 lb 6.4 oz (74.571 kg)  01/19/16 163 lb 6.4 oz (74.118 kg)     Review of the above records demonstrates:    ASSESSMENT AND PLAN:  1. Coronary artery disease: He offers no cardiac complaints. He'll continue on statin dual antiplatelet therapy and metoprolol. We'll see him again in one year unless he is symptomatic.check magnesium, BMET, and CBC.  2. Tobacco abuse:smoking cessation counseling is provided. He is aware that he needs to quit smoking but has not ready to do so at this time. I will continue to reinforce this counseling.   Current medicines are reviewed at length with the patient today.    Labs/ tests ordered today include: BMET, CBC, magnesium No orders of the defined types were placed in this encounter.     Disposition:   FU with one year Signed, Jory Sims, NP  02/25/2016 3:05 PM    [facility] [address], [place],  [address] Phone: [phone]; Fax: [phone]

## 2016-02-26 ENCOUNTER — Ambulatory Visit: Payer: BLUE CROSS/BLUE SHIELD | Admitting: Adult Health

## 2016-02-26 LAB — CBC WITH DIFFERENTIAL/PLATELET
BASOS PCT: 1 %
Basophils Absolute: 71 cells/uL (ref 0–200)
EOS ABS: 142 {cells}/uL (ref 15–500)
EOS PCT: 2 %
HCT: 43.4 % (ref 38.5–50.0)
Hemoglobin: 15.1 g/dL (ref 13.2–17.1)
Lymphocytes Relative: 33 %
Lymphs Abs: 2343 cells/uL (ref 850–3900)
MCH: 30.9 pg (ref 27.0–33.0)
MCHC: 34.8 g/dL (ref 32.0–36.0)
MCV: 88.8 fL (ref 80.0–100.0)
MONOS PCT: 6 %
MPV: 9.8 fL (ref 7.5–12.5)
Monocytes Absolute: 426 cells/uL (ref 200–950)
NEUTROS ABS: 4118 {cells}/uL (ref 1500–7800)
Neutrophils Relative %: 58 %
PLATELETS: 239 10*3/uL (ref 140–400)
RBC: 4.89 MIL/uL (ref 4.20–5.80)
RDW: 13.3 % (ref 11.0–15.0)
WBC: 7.1 10*3/uL (ref 3.8–10.8)

## 2016-02-26 LAB — BASIC METABOLIC PANEL
BUN: 23 mg/dL (ref 7–25)
CALCIUM: 9 mg/dL (ref 8.6–10.3)
CHLORIDE: 104 mmol/L (ref 98–110)
CO2: 23 mmol/L (ref 20–31)
CREATININE: 0.89 mg/dL (ref 0.70–1.33)
Glucose, Bld: 86 mg/dL (ref 65–99)
Potassium: 4.6 mmol/L (ref 3.5–5.3)
Sodium: 139 mmol/L (ref 135–146)

## 2016-02-26 LAB — MAGNESIUM: MAGNESIUM: 1.9 mg/dL (ref 1.5–2.5)

## 2016-03-12 ENCOUNTER — Other Ambulatory Visit: Payer: Self-pay | Admitting: Cardiology

## 2016-04-13 ENCOUNTER — Other Ambulatory Visit: Payer: Self-pay | Admitting: Adult Health

## 2016-04-18 ENCOUNTER — Encounter: Payer: Self-pay | Admitting: Internal Medicine

## 2016-06-08 ENCOUNTER — Ambulatory Visit (INDEPENDENT_AMBULATORY_CARE_PROVIDER_SITE_OTHER): Payer: BLUE CROSS/BLUE SHIELD | Admitting: Nurse Practitioner

## 2016-06-08 ENCOUNTER — Encounter: Payer: Self-pay | Admitting: Nurse Practitioner

## 2016-06-08 VITALS — BP 120/73 | HR 70 | Temp 98.0°F | Ht 66.0 in | Wt 163.8 lb

## 2016-06-08 DIAGNOSIS — K5900 Constipation, unspecified: Secondary | ICD-10-CM | POA: Insufficient documentation

## 2016-06-08 DIAGNOSIS — K625 Hemorrhage of anus and rectum: Secondary | ICD-10-CM | POA: Diagnosis not present

## 2016-06-08 DIAGNOSIS — K2100 Gastro-esophageal reflux disease with esophagitis, without bleeding: Secondary | ICD-10-CM

## 2016-06-08 DIAGNOSIS — K219 Gastro-esophageal reflux disease without esophagitis: Secondary | ICD-10-CM | POA: Insufficient documentation

## 2016-06-08 MED ORDER — SUCRALFATE 1 G PO TABS: 1.0000 g | ORAL_TABLET | Freq: Three times a day (TID) | ORAL | 1 refills | Status: DC | PRN

## 2016-06-08 NOTE — Patient Instructions (Signed)
1. Take your Plavix in the morning and your AcipHex mid day with lunch. 2. Continue taking Colace twice a day to promote regular, soft bowel movements. 3. Notify us if you need a medication for hemorrhoids and we can send it to your pharmacy. 4. I sent Carafate to your pharmacy. If you need to take Carafate for breakthrough acid reflux symptoms, crush one tab and mix it with some water to make slurry. Drink the slurry. 5. Return for follow-up in 6 months.

## 2016-06-08 NOTE — Progress Notes (Signed)
Referring Provider: Jake Samples, PA* Primary Care Physician:  [name] Primary GI:  Dr. Gala Romney  Chief Complaint  Patient presents with  . Follow-up    abd pain at time but overall better    HPI:   Anthony Hensley is a 55 y.o. male who presents For follow-up on reflux esophagitis and constipation. He was last seen in our office 02/19/2016 for the same. At his last office visit he was noted recent colonoscopy with hemorrhoids as cause of bleeding, serrated adenoma removed and due for surveillance in 2022, dysphagia resolved status post [facility] dilation. Dexilant was too expensive ($400 a month out-of-pocket) and Protonix previously ineffective. Continued on Plavix. He was recommended to take Colace 100 mg twice a day, MiraLAX as needed, Dexilant samples, PPI mid day with Plavix taken first in the morning. We'll discuss with insurance company for [employer] alternative. A prescription for AcipHex was sent into the pharmacy.  Today he states he's doing fairly well. Is taking Aciphex. Some continued abdominal pain but overall much improved. Is taking Aciphex and Plavix both in the morning. Abdominal pain is mid to upper abdomen, described as burning. At times post-prandial and other times not. Denies N/V, unintentional weight loss, fever, chills, melena. Occasional hematochezia from hemorrhoids. Is taking Colace as recommended with improvement in constipation. Occasional hemorrhoid itching, otherwise no other symptoms. Denies chest pain, dyspnea, dizziness, lightheadedness, syncope, near syncope. Denies any other upper or lower GI symptoms.  Past Medical History:  Diagnosis Date  . CAD (coronary artery disease)   . Diverticulosis   . Hypercholesterolemia   . Hypertension     Past Surgical History:  Procedure Laterality Date  . CARDIAC CATHETERIZATION N/A 07/13/2015   Procedure: Left Heart Cath and Coronary Angiography;  Surgeon: Burnell Blanks, MD;  Location: [place] [facility];  Service: Cardiovascular;  Laterality: N/A;  . CARDIAC CATHETERIZATION N/A 07/13/2015   PCI + DES to the mid circ. LVEF was normal at 65%  . COLONOSCOPY N/A 12/31/2015   Dr.Rourk- diverticulosis,67mm polyp in the splenic flexure, 14mm polyp in the splenic flexure, 55mm polyp in the sigmoid colon bx= traditional serrated adenoma  . ESOPHAGOGASTRODUODENOSCOPY N/A 12/31/2015   Dr.Rourk- esophagitis with no bleeding, diffuse moderately erythematous mucosa without bleeding was found in the entire examined stomach. stomach bx= slight chronic inflammation. esophagus bx= benign gastresophageal junction mucosa  . MALONEY DILATION N/A 12/31/2015   Procedure: Venia Minks DILATION;  Surgeon: [name], MD;  [facility];  Service: Endoscopy;  Laterality: N/A;    Current Outpatient Prescriptions  Medication Sig Dispense Refill  . aspirin 81 MG EC tablet Take 1 tablet (81 mg total) by mouth daily. 30 tablet   . atorvastatin (LIPITOR) 80 MG tablet TAKE 1 TABLET BY MOUTH EVERY DAY AT 6 PM 30 tablet 6  . clopidogrel (PLAVIX) 75 MG tablet TAKE 1 TABLET BY MOUTH EVERY DAY WITH BREAKFAST 30 tablet 11  . enalapril (VASOTEC) 10 MG tablet TAKE 1 TABLET BY MOUTH EVERY DAY 30 tablet 6  . Glycerin-Hypromellose-PEG 400 (VISINE TEARS OP) Apply 1 drop to eye daily.    . hyoscyamine (LEVSIN SL) 0.125 MG SL tablet Place 1 tablet (0.125 mg total) under the tongue every 4 (four) hours as needed. 60 tablet 3  . metoprolol tartrate (LOPRESSOR) 25 MG tablet Take 0.5 tablets (12.5 mg total) by mouth 2 (two) times daily. 60 tablet 5  . RABEprazole (ACIPHEX) 20 MG tablet Take 1 tablet (20 mg total)  by mouth daily. 30 tablet 11  . sucralfate (CARAFATE) 1 g tablet TAKE 1 TABLET BY MOUTH 4 TIMES DAILY (Patient not taking: Reported on 06/08/2016) 120 tablet 0   No current facility-administered medications for this visit.     Allergies as of 06/08/2016  . (No Known Allergies)    Family History  Problem  Relation Age of Onset  . Heart attack Father 76    Deceased  . Colon cancer Neg Hx     Social History   Social History  . Marital status: Married    Spouse name: N/A  . Number of children: N/A  . Years of education: N/A   Occupational History  . Machine Teacher, English as a foreign language [employer]   Social History Main Topics  . Smoking status: Current Some Day Smoker    Packs/day: 0.75    Years: 35.00    Types: Cigarettes  . Smokeless tobacco: Never Used  . Alcohol use No  . Drug use: No  . Sexual activity: Not Asked   Other Topics Concern  . None   Social History Narrative  . None    Review of Systems: General: Negative for anorexia, weight loss, fever, chills, fatigue, weakness. ENT: Negative for hoarseness, difficulty swallowing. CV: Negative for chest pain, angina, palpitations, peripheral edema.  Respiratory: Negative for dyspnea at rest, cough, sputum, wheezing.  GI: See history of present illness. Endo: Negative for unusual weight change.  Heme: Negative for bruising or bleeding.   Physical Exam: BP 120/73   Pulse 70   Temp 98 F (36.7 C) (Oral)   Ht 5\' 6"  (1.676 m)   Wt 163 lb 12.8 oz (74.3 kg)   BMI 26.44 kg/m  General:   Alert and oriented. Pleasant and cooperative. Well-nourished and well-developed.  Ears:  Normal auditory acuity. Cardiovascular:  S1, S2 present without murmurs appreciated. Extremities without clubbing or edema. Respiratory:  Clear to auscultation bilaterally. No wheezes, rales, or rhonchi. No distress.  Gastrointestinal:  +BS, soft, non-tender and non-distended. No HSM noted. No guarding or rebound. No masses appreciated.  Rectal:  Deferred  Musculoskalatal:  Symmetrical without gross deformities. Neurologic:  Alert and oriented x4;  grossly normal neurologically. Psych:  Alert and cooperative. Normal mood and affect. Heme/Lymph/Immune: No excessive bruising noted.    06/08/2016 3:42 PM   Disclaimer: This note was  dictated with voice recognition software. Similar sounding words can inadvertently be transcribed and may not be corrected upon review.

## 2016-06-08 NOTE — Assessment & Plan Note (Signed)
Occasional/rare intermittent rectal bleeding from hemorrhoids. Occasional hemorrhoid itching as well. Offered for him to call us when he does have flareup symptoms and we can send an Anusol rectal cream to his pharmacy. Continue to monitor. Recent colonoscopy reassuring.

## 2016-06-08 NOTE — Progress Notes (Signed)
cc'ed to pcp °

## 2016-06-08 NOTE — Assessment & Plan Note (Signed)
Constipation essentially resolved with Colace 100 mg twice a day. Continue to monitor, return for follow-up in 6 months or as needed.

## 2016-06-08 NOTE — Assessment & Plan Note (Signed)
Patient with a history of GERD. Today he states his symptoms are significantly improved on AcipHex. 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 He is taking AcipHex in the morning along with Plavix. Recommend he space these out to take Plavix in the morning and AcipHex in the afternoon. Does have some residual symptoms and I will send in Carafate that he can take as needed for breakthrough symptoms. Return for follow-up in 6 months.

## 2016-07-13 ENCOUNTER — Other Ambulatory Visit: Payer: Self-pay | Admitting: Adult Health

## 2016-07-19 ENCOUNTER — Other Ambulatory Visit: Payer: Self-pay

## 2016-07-19 MED ORDER — METOPROLOL TARTRATE 25 MG PO TABS: 12.5000 mg | ORAL_TABLET | Freq: Two times a day (BID) | ORAL | 3 refills | Status: DC

## 2016-07-19 NOTE — Telephone Encounter (Signed)
Refilled lopressor

## 2016-09-26 ENCOUNTER — Observation Stay (HOSPITAL_COMMUNITY): Payer: Commercial Managed Care - PPO

## 2016-09-26 ENCOUNTER — Emergency Department (HOSPITAL_COMMUNITY): Payer: Commercial Managed Care - PPO

## 2016-09-26 ENCOUNTER — Inpatient Hospital Stay (HOSPITAL_COMMUNITY)
Admission: EM | Admit: 2016-09-26 | Discharge: 2016-09-28 | DRG: 392 | Disposition: A | Discharge status: Home / Self Care | Payer: Commercial Managed Care - PPO | Attending: Internal Medicine | Admitting: Internal Medicine

## 2016-09-26 ENCOUNTER — Encounter (HOSPITAL_COMMUNITY): Payer: Self-pay | Admitting: *Deleted

## 2016-09-26 DIAGNOSIS — Z955 Presence of coronary angioplasty implant and graft: Secondary | ICD-10-CM

## 2016-09-26 DIAGNOSIS — Z79899 Other long term (current) drug therapy: Secondary | ICD-10-CM

## 2016-09-26 DIAGNOSIS — E860 Dehydration: Secondary | ICD-10-CM | POA: Diagnosis present

## 2016-09-26 DIAGNOSIS — R000 Tachycardia, unspecified: Secondary | ICD-10-CM | POA: Diagnosis present

## 2016-09-26 DIAGNOSIS — Z7902 Long term (current) use of antithrombotics/antiplatelets: Secondary | ICD-10-CM

## 2016-09-26 DIAGNOSIS — R1031 Right lower quadrant pain: Secondary | ICD-10-CM

## 2016-09-26 DIAGNOSIS — K529 Noninfective gastroenteritis and colitis, unspecified: Secondary | ICD-10-CM | POA: Diagnosis not present

## 2016-09-26 DIAGNOSIS — A0819 Acute gastroenteropathy due to other small round viruses: Principal | ICD-10-CM | POA: Diagnosis present

## 2016-09-26 DIAGNOSIS — F17210 Nicotine dependence, cigarettes, uncomplicated: Secondary | ICD-10-CM | POA: Diagnosis present

## 2016-09-26 DIAGNOSIS — E7800 Pure hypercholesterolemia, unspecified: Secondary | ICD-10-CM | POA: Diagnosis present

## 2016-09-26 DIAGNOSIS — I10 Essential (primary) hypertension: Secondary | ICD-10-CM | POA: Diagnosis present

## 2016-09-26 DIAGNOSIS — K5790 Diverticulosis of intestine, part unspecified, without perforation or abscess without bleeding: Secondary | ICD-10-CM | POA: Diagnosis present

## 2016-09-26 DIAGNOSIS — E785 Hyperlipidemia, unspecified: Secondary | ICD-10-CM | POA: Diagnosis present

## 2016-09-26 DIAGNOSIS — R197 Diarrhea, unspecified: Secondary | ICD-10-CM | POA: Diagnosis present

## 2016-09-26 DIAGNOSIS — K56609 Unspecified intestinal obstruction, unspecified as to partial versus complete obstruction: Secondary | ICD-10-CM

## 2016-09-26 DIAGNOSIS — I2510 Atherosclerotic heart disease of native coronary artery without angina pectoris: Secondary | ICD-10-CM | POA: Diagnosis present

## 2016-09-26 DIAGNOSIS — Z8249 Family history of ischemic heart disease and other diseases of the circulatory system: Secondary | ICD-10-CM

## 2016-09-26 DIAGNOSIS — Z7982 Long term (current) use of aspirin: Secondary | ICD-10-CM

## 2016-09-26 LAB — COMPREHENSIVE METABOLIC PANEL
ALBUMIN: 4.4 g/dL (ref 3.5–5.0)
ALK PHOS: 94 U/L (ref 38–126)
ALT: 30 U/L (ref 17–63)
AST: 22 U/L (ref 15–41)
Anion gap: 6 (ref 5–15)
BILIRUBIN TOTAL: 1 mg/dL (ref 0.3–1.2)
BUN: 19 mg/dL (ref 6–20)
CALCIUM: 9.6 mg/dL (ref 8.9–10.3)
CO2: 27 mmol/L (ref 22–32)
Chloride: 104 mmol/L (ref 101–111)
Creatinine, Ser: 0.87 mg/dL (ref 0.61–1.24)
GFR calc Af Amer: >60 mL/min (ref >60)
GFR calc non Af Amer: >60 mL/min (ref >60)
GLUCOSE: 131 mg/dL — AB (ref 65–99)
Potassium: 3.9 mmol/L (ref 3.5–5.1)
Sodium: 137 mmol/L (ref 135–145)
TOTAL PROTEIN: 7.8 g/dL (ref 6.5–8.1)

## 2016-09-26 LAB — C DIFFICILE QUICK SCREEN W PCR REFLEX
C DIFFICLE (CDIFF) ANTIGEN: NEGATIVE
C Diff interpretation: NOT DETECTED
C Diff toxin: NEGATIVE

## 2016-09-26 LAB — URINALYSIS, ROUTINE W REFLEX MICROSCOPIC
BILIRUBIN URINE: NEGATIVE
Glucose, UA: NEGATIVE mg/dL
Ketones, ur: NEGATIVE mg/dL
Leukocytes, UA: NEGATIVE
NITRITE: NEGATIVE
PH: 5.5 (ref 5.0–8.0)
Protein, ur: NEGATIVE mg/dL
Specific Gravity, Urine: >1.03 — ABNORMAL HIGH (ref 1.005–1.030)

## 2016-09-26 LAB — URINALYSIS, MICROSCOPIC (REFLEX)

## 2016-09-26 LAB — CBC WITH DIFFERENTIAL/PLATELET
BASOS PCT: 0 %
Basophils Absolute: 0 10*3/uL (ref 0.0–0.1)
EOS ABS: 0 10*3/uL (ref 0.0–0.7)
Eosinophils Relative: 0 %
HCT: 50.5 % (ref 39.0–52.0)
Hemoglobin: 17.2 g/dL — ABNORMAL HIGH (ref 13.0–17.0)
Lymphocytes Relative: 6 %
Lymphs Abs: 0.5 10*3/uL — ABNORMAL LOW (ref 0.7–4.0)
MCH: 31.6 pg (ref 26.0–34.0)
MCHC: 34.1 g/dL (ref 30.0–36.0)
MCV: 92.8 fL (ref 78.0–100.0)
MONO ABS: 0.5 10*3/uL (ref 0.1–1.0)
MONOS PCT: 5 %
Neutro Abs: 8.1 10*3/uL — ABNORMAL HIGH (ref 1.7–7.7)
Neutrophils Relative %: 89 %
Platelets: 203 10*3/uL (ref 150–400)
RBC: 5.44 MIL/uL (ref 4.22–5.81)
RDW: 12.6 % (ref 11.5–15.5)
WBC: 9.1 10*3/uL (ref 4.0–10.5)

## 2016-09-26 LAB — LIPASE, BLOOD: Lipase: 40 U/L (ref 11–51)

## 2016-09-26 LAB — I-STAT CG4 LACTIC ACID, ED: LACTIC ACID, VENOUS: 0.56 mmol/L (ref 0.5–1.9)

## 2016-09-26 MED ORDER — ONDANSETRON HCL 4 MG PO TABS: 4.0000 mg | ORAL_TABLET | Freq: Four times a day (QID) | ORAL | Status: DC | PRN

## 2016-09-26 MED ORDER — ALBUTEROL SULFATE (2.5 MG/3ML) 0.083% IN NEBU: 3.0000 mL | INHALATION_SOLUTION | Freq: Every day | RESPIRATORY_TRACT | Status: DC | PRN

## 2016-09-26 MED ORDER — METOPROLOL TARTRATE 25 MG PO TABS
12.5000 mg | ORAL_TABLET | Freq: Two times a day (BID) | ORAL | Status: DC
  Administered 2016-09-26 – 2016-09-28 (×5): 12.5 mg via ORAL
  Filled 2016-09-26 (×5): qty 1

## 2016-09-26 MED ORDER — IOPAMIDOL (ISOVUE-300) INJECTION 61%
INTRAVENOUS | Status: AC
  Administered 2016-09-26: 30 mL
  Filled 2016-09-26: qty 30

## 2016-09-26 MED ORDER — ALBUTEROL SULFATE (2.5 MG/3ML) 0.083% IN NEBU: 3.0000 mL | INHALATION_SOLUTION | RESPIRATORY_TRACT | Status: DC | PRN

## 2016-09-26 MED ORDER — SODIUM CHLORIDE 0.9 % IV BOLUS (SEPSIS)
1000.0000 mL | Freq: Once | INTRAVENOUS | Status: AC
  Administered 2016-09-26: 1000 mL via INTRAVENOUS

## 2016-09-26 MED ORDER — MORPHINE SULFATE (PF) 2 MG/ML IV SOLN
1.0000 mg | INTRAVENOUS | Status: DC | PRN
  Administered 2016-09-26 – 2016-09-27 (×3): 2 mg via INTRAVENOUS
  Filled 2016-09-26 (×3): qty 1

## 2016-09-26 MED ORDER — CLOPIDOGREL BISULFATE 75 MG PO TABS
75.0000 mg | ORAL_TABLET | Freq: Every day | ORAL | Status: DC
  Administered 2016-09-26 – 2016-09-28 (×3): 75 mg via ORAL
  Filled 2016-09-26 (×3): qty 1

## 2016-09-26 MED ORDER — FAMOTIDINE IN NACL 20-0.9 MG/50ML-% IV SOLN
20.0000 mg | Freq: Once | INTRAVENOUS | Status: AC
  Administered 2016-09-26: 20 mg via INTRAVENOUS
  Filled 2016-09-26: qty 50

## 2016-09-26 MED ORDER — ENOXAPARIN SODIUM 40 MG/0.4ML ~~LOC~~ SOLN
40.0000 mg | SUBCUTANEOUS | Status: DC
  Administered 2016-09-26 – 2016-09-27 (×2): 40 mg via SUBCUTANEOUS
  Filled 2016-09-26 (×2): qty 0.4

## 2016-09-26 MED ORDER — NITROGLYCERIN 0.4 MG SL SUBL: 0.4000 mg | SUBLINGUAL_TABLET | Freq: Every day | SUBLINGUAL | Status: DC | PRN

## 2016-09-26 MED ORDER — TRAMADOL HCL 50 MG PO TABS
50.0000 mg | ORAL_TABLET | Freq: Four times a day (QID) | ORAL | Status: DC | PRN
Start: 2016-09-26 — End: 2016-09-28

## 2016-09-26 MED ORDER — POTASSIUM CHLORIDE IN NACL 20-0.9 MEQ/L-% IV SOLN
INTRAVENOUS | Status: DC
  Administered 2016-09-26 – 2016-09-28 (×2): via INTRAVENOUS

## 2016-09-26 MED ORDER — FENTANYL CITRATE (PF) 100 MCG/2ML IJ SOLN
50.0000 ug | Freq: Once | INTRAMUSCULAR | Status: AC
  Administered 2016-09-26: 50 ug via INTRAVENOUS
  Filled 2016-09-26: qty 2

## 2016-09-26 MED ORDER — VANCOMYCIN 50 MG/ML ORAL SOLUTION
125.0000 mg | Freq: Four times a day (QID) | ORAL | Status: DC
  Administered 2016-09-26: 125 mg via ORAL
  Filled 2016-09-26 (×9): qty 2.5

## 2016-09-26 MED ORDER — ONDANSETRON HCL 4 MG/2ML IJ SOLN: 4.0000 mg | Freq: Four times a day (QID) | INTRAMUSCULAR | Status: DC | PRN

## 2016-09-26 MED ORDER — ALBUTEROL SULFATE HFA 108 (90 BASE) MCG/ACT IN AERS: 2.0000 | INHALATION_SPRAY | RESPIRATORY_TRACT | Status: DC | PRN

## 2016-09-26 MED ORDER — METRONIDAZOLE 500 MG PO TABS
500.0000 mg | ORAL_TABLET | Freq: Three times a day (TID) | ORAL | Status: DC
  Administered 2016-09-27 – 2016-09-28 (×4): 500 mg via ORAL
  Filled 2016-09-26 (×5): qty 1

## 2016-09-26 MED ORDER — ONDANSETRON HCL 4 MG/2ML IJ SOLN
4.0000 mg | Freq: Once | INTRAMUSCULAR | Status: AC
  Administered 2016-09-26: 4 mg via INTRAVENOUS
  Filled 2016-09-26: qty 2

## 2016-09-26 MED ORDER — IOPAMIDOL (ISOVUE-300) INJECTION 61%
100.0000 mL | Freq: Once | INTRAVENOUS | Status: AC | PRN
  Administered 2016-09-26: 100 mL via INTRAVENOUS

## 2016-09-26 NOTE — H&P (Signed)
History and Physical    Anthony Hensley [id number] DOB: Jul 31, 1961 DOA: 09/26/2016  PCP: [name]  Patient coming from: Home   Chief Complaint: Abdominal pain and diarrhea.   HPI: Anthony Hensley is a 55 y.o. male with medical history significant of CAD S/P stent 2016, HTN who presents complaining of abdominal pain, right side, constant, severity fluctuates from 5 to 10. He also report Diarrhea. He has had mor than 10 BM sine last night. He relates symptoms started night prior to admission. He report subjective fever yesterday. He only vomit once today after he drank contrast. Denies sick contact. He took antibiotics for bronchitis last month.    ED Course: Patient found to be tachycardic, hb at 17, normal electrolytes. CT abdomen pelvis: Multiple loops above fluid-filled small and large bowel, likely indicative of a degree of enterocolitis. There is a degree of small bowel dilatation with a transition zone near the jejunoileal junction, concerning for early bowel obstruction. No free air. No abscess.  Appendix appears normal.   Review of Systems: As per HPI otherwise 10 point review of systems negative.    Past Medical History:  Diagnosis Date  . CAD (coronary artery disease)   . Diverticulosis   . Hypercholesterolemia   . Hypertension     Past Surgical History:  Procedure Laterality Date  . CARDIAC CATHETERIZATION N/A 07/13/2015   Procedure: Left Heart Cath and Coronary Angiography;  Surgeon: Burnell Blanks, MD;  Location: [facility];  Service: Cardiovascular;  Laterality: N/A;  . CARDIAC CATHETERIZATION N/A 07/13/2015   PCI + DES to the mid circ. LVEF was normal at 65%  . COLONOSCOPY N/A 12/31/2015   Dr.Rourk- diverticulosis,67mm polyp in the splenic flexure, 70mm polyp in the splenic flexure, 24mm polyp in the sigmoid colon bx= traditional serrated adenoma  . ESOPHAGOGASTRODUODENOSCOPY N/A 12/31/2015   Dr.Rourk- esophagitis with no bleeding, diffuse  moderately erythematous mucosa without bleeding was found in the entire examined stomach. stomach bx= slight chronic inflammation. esophagus bx= benign gastresophageal junction mucosa  . MALONEY DILATION N/A 12/31/2015   Procedure: Venia Minks DILATION;  Surgeon: [name], MD;  Location: [facility];  Service: Endoscopy;  Laterality: N/A;     reports that he has been smoking Cigarettes.  He has a 26.25 pack-year smoking history. He has never used smokeless tobacco. He reports that he does not drink alcohol or use drugs.  No Known Allergies  Family History  Problem Relation Age of Onset  . Heart attack Father 80    Deceased  . Colon cancer Neg Hx     Prior to Admission medications   Medication Sig Start Date End Date Taking? Authorizing Provider  albuterol (PROVENTIL) (2.5 MG/3ML) 0.083% nebulizer solution Take 3 mLs by nebulization daily as needed for shortness of breath. 08/04/16  Yes Historical Provider, MD  aspirin 81 MG EC tablet Take 1 tablet (81 mg total) by mouth daily. 07/14/15  Yes [name], PA-C  atorvastatin (LIPITOR) 80 MG tablet TAKE 1 TABLET BY MOUTH EVERY DAY AT 6 p.m. 07/13/16  Yes [name], NP  clopidogrel (PLAVIX) 75 MG tablet TAKE 1 TABLET BY MOUTH EVERY DAY WITH BREAKFAST 03/14/16  Yes [name], NP  enalapril (VASOTEC) 10 MG tablet TAKE 1 TABLET BY MOUTH EVERY DAY 07/13/16  Yes [name], NP  Glycerin-Hypromellose-PEG 400 (VISINE TEARS OP) Apply 1 drop to eye daily as needed (dry eyes).    Yes Historical Provider, MD  metoprolol tartrate (  LOPRESSOR) 25 MG tablet Take 0.5 tablets (12.5 mg total) by mouth 2 (two) times daily. 07/19/16  Yes [name], NP  NITROSTAT 0.4 MG SL tablet Place 1 tablet under the tongue daily as needed for chest pain. 07/02/16  Yes Historical Provider, MD  PROAIR HFA 108 (90 Base) MCG/ACT inhaler Inhale 2 puffs into the lungs every 4 (four) hours as needed for shortness of breath. 08/01/16  Yes  Historical Provider, MD  RABEprazole (ACIPHEX) 20 MG tablet Take 1 tablet (20 mg total) by mouth daily. 02/22/16  Yes [name], MD  triamcinolone lotion (KENALOG) 0.1 % Apply 1 application topically 2 (two) times daily as needed (rash).    Yes Historical Provider, MD  varenicline (CHANTIX PAK) 0.5 MG X 11 & 1 MG X 42 tablet Take 1 mg by mouth 2 (two) times daily.    Yes Historical Provider, MD    Physical Exam: Vitals:   09/26/16 0528 09/26/16 0800 09/26/16 1111 09/26/16 1200  BP:  119/85 127/82 110/63  Pulse:  102 118 115  Resp:  18 20   Temp: 99.1 F (37.3 C)  99.4 F (37.4 C)   TempSrc: Oral  Oral   SpO2:  97% 93% 92%  Weight:      Height:          Constitutional: NAD, calm, comfortable Vitals:   09/26/16 0528 09/26/16 0800 09/26/16 1111 09/26/16 1200  BP:  119/85 127/82 110/63  Pulse:  102 118 115  Resp:  18 20   Temp: 99.1 F (37.3 C)  99.4 F (37.4 C)   TempSrc: Oral  Oral   SpO2:  97% 93% 92%  Weight:      Height:       Eyes: PERRL, lids and conjunctivae normal ENMT: Mucous membranes are moist. Posterior pharynx clear of any exudate or lesions.Normal dentition.  Neck: normal, supple, no masses, no thyromegaly Respiratory: clear to auscultation bilaterally, no wheezing, no crackles. Normal respiratory effort. No accessory muscle use.  Cardiovascular: Regular rate and rhythm, no murmurs / rubs / gallops. No extremity edema. 2+ pedal pulses. No carotid bruits.  Abdomen: no tenderness, no masses palpated. No hepatosplenomegaly. Bowel sounds positive.  Musculoskeletal: no clubbing / cyanosis. No joint deformity upper and lower extremities. Good ROM, no contractures. Normal muscle tone.  Skin: no rashes, lesions, ulcers. No induration Neurologic: CN 2-12 grossly intact. Sensation intact, DTR normal. Strength 5/5 in all 4.  Psychiatric: Normal judgment and insight. Alert and oriented x 3. Normal mood.    Labs on Admission: I have personally reviewed following  labs and imaging studies  CBC:  Recent Labs Lab 09/26/16 0139  WBC 9.1  NEUTROABS 8.1*  HGB 17.2*  HCT 50.5  MCV 92.8  PLT 123456   Basic Metabolic Panel:  Recent Labs Lab 09/26/16 0139  NA 137  K 3.9  CL 104  CO2 27  GLUCOSE 131*  BUN 19  CREATININE 0.87  CALCIUM 9.6   GFR: Estimated Creatinine Clearance: 88.6 mL/min (by C-G formula based on SCr of 0.87 mg/dL). Liver Function Tests:  Recent Labs Lab 09/26/16 0139  AST 22  ALT 30  ALKPHOS 94  BILITOT 1.0  PROT 7.8  ALBUMIN 4.4    Recent Labs Lab 09/26/16 0139  LIPASE 40   No results for input(s): AMMONIA in the last 168 hours. Coagulation Profile: No results for input(s): INR, PROTIME in the last 168 hours. Cardiac Enzymes: No results for input(s): CKTOTAL, CKMB, CKMBINDEX, TROPONINI in  the last 168 hours. BNP (last 3 results) No results for input(s): PROBNP in the last 8760 hours. HbA1C: No results for input(s): HGBA1C in the last 72 hours. CBG: No results for input(s): GLUCAP in the last 168 hours. Lipid Profile: No results for input(s): CHOL, HDL, LDLCALC, TRIG, CHOLHDL, LDLDIRECT in the last 72 hours. Thyroid Function Tests: No results for input(s): TSH, T4TOTAL, FREET4, T3FREE, THYROIDAB in the last 72 hours. Anemia Panel: No results for input(s): VITAMINB12, FOLATE, FERRITIN, TIBC, IRON, RETICCTPCT in the last 72 hours. Urine analysis:    Component Value Date/Time   COLORURINE YELLOW 09/26/2016 0245   APPEARANCEUR CLEAR 09/26/2016 0245   LABSPEC >1.030 (H) 09/26/2016 0245   PHURINE 5.5 09/26/2016 0245   GLUCOSEU NEGATIVE 09/26/2016 0245   HGBUR TRACE (A) 09/26/2016 0245   BILIRUBINUR NEGATIVE 09/26/2016 0245   KETONESUR NEGATIVE 09/26/2016 0245   PROTEINUR NEGATIVE 09/26/2016 0245   UROBILINOGEN 0.2 06/01/2015 2320   NITRITE NEGATIVE 09/26/2016 0245   LEUKOCYTESUR NEGATIVE 09/26/2016 0245   Sepsis Labs:  !!!!!!!!!!!!!!!!!!!!!!!!!!!!!!!!!!!!!!!!!!!! @LABRCNTIP (procalcitonin:4,lacticidven:4) )No results found for this or any previous visit (from the past 240 hour(s)).   Radiological Exams on Admission: Ct Abdomen Pelvis W Contrast  Result Date: 09/26/2016 CLINICAL DATA:  Abdominal pain and diarrhea EXAM: CT ABDOMEN AND PELVIS WITH CONTRAST TECHNIQUE: Multidetector CT imaging of the abdomen and pelvis was performed using the standard protocol following bolus administration of intravenous contrast. Oral contrast was also administered. CONTRAST:  155mL ISOVUE-300 IOPAMIDOL (ISOVUE-300) INJECTION 61% COMPARISON:  November 09, 2015 FINDINGS: Lower chest: Lung bases are clear. Hepatobiliary: No focal liver lesions are evident. Gallbladder wall is not appreciably thickened. There is no biliary duct dilatation. Pancreas: There is no pancreatic mass or inflammatory focus. Spleen: Splenic lesions are evident. Adrenals/Urinary Tract: Adrenals appear normal bilaterally. There is a 9 x 7 mm cyst in the mid left kidney. There is no hydronephrosis on either side. There is no renal or ureteral calculus on either side. Urinary bladder is midline with wall thickness within normal limits. Stomach/Bowel: There is small bowel dilatation with fluid throughout much small bowel. There is a relative transition zone near the jejunoileal junction, concerning for a degree of bowel obstruction. No free air or portal venous air is evident. The mesentery is not appreciably thickened. There is fluid throughout much of the colon. Vascular/Lymphatic: There is atherosclerotic calcification in the aorta and right common iliac artery. There is no abdominal aortic aneurysm. Major mesenteric vessels appear patent. There is no appreciable adenopathy in the abdomen or pelvis. Reproductive: Prostate and seminal vesicles appear normal in size and contour. There is no pelvic mass or pelvic fluid collection. Other: Appendix appears normal. There is no  ascites or abscess in the abdomen or pelvis. There is a minimal ventral hernia containing only fat. Musculoskeletal: There is a stable exostosis arising from the lateral upper left iliac crest, stable. There are no blastic or lytic bone lesions. There is no intramuscular or abdominal wall lesion. IMPRESSION: Multiple loops above fluid-filled small and large bowel, likely indicative of a degree of enterocolitis. There is a degree of small bowel dilatation with a transition zone near the jejunoileal junction, concerning for early bowel obstruction. No free air. No abscess.  Appendix appears normal. No renal or ureteral calculus.  No hydronephrosis. There is aortoiliac atherosclerosis. Rather minimal ventral hernia present containing only fat. Electronically Signed   By: Lowella Grip III M.D.   On: 09/26/2016 08:03    EKG: none available.   Assessment/Plan  Active Problems:   Essential hypertension   Hyperlipidemia   Diarrhea   Enteritis  1-Enterocolitis; ? Early Bowel obstruction.  History more consistent with enteritis, patient having multiple BM Admit to hospital for IV fluids.  CT scan with early Bowel obstruction. Check Lactic acid if elevated will consult General Sx.  C diff and GI pathogen ordered.  Will treat empirically for C diff., started oral vancomycin.  NPO. Check KUB in am.   2-CAD; continue with Plavix, metoprolol.  3-HTN; hold lisinopril.Marland Kitchen  4-HLD; hold statins until tolerating diet.    DVT prophylaxis: lovenox.  Code Status: Full code.  Family Communication: care discussed with patient  Disposition Plan: home in 24 to 48 hours depending clinical course.  Consults called: none Admission status: Observation, telemetry    Niel Hummer A MD [facility] Pager [phone] If 7PM-7AM, please contact night-coverage [email or web address] Password [email or web address]  09/26/2016, 1:12 PM

## 2016-09-26 NOTE — ED Provider Notes (Signed)
[facility] DEPT Provider Note   CSN: [id number] Arrival date & time: 09/26/16  0040  Time seen 01:15 AM   History   Chief Complaint Chief Complaint  Patient presents with  . Diarrhea    HPI Anthony ENERSON is a 55 y.o. male.  HPI   patient states they went to his relative's house this evening to have Christmas dinner. He states he ate food that he would normally eat. He states after they got home he started feeling bad about 9 PM. He states when he went to lay down to go to bed he started having periumbilical abdominal pain that he states was severe and described as aching. He states the pain was there constantly. He then started having diarrhea and states he's had a total of about 10 episodes of diarrhea that is nonbloody. He denies nausea, or vomiting. He states he's been feeling hot and cold. He also has a chronic cough but states he smokes. He denies sore throat. He denies being around anybody else who is ill.  Wife states patient had colonoscopy earlier this year. On reviewing his records he had upper endoscopy and colonoscopy done in March of this year. This was done by Dr. Gala Romney. When I review his studies he had diffuse inflammation of the stomach on endoscopy, on colonoscopy had 3 polyps that were removed and were precancerous per wife. He also was noted to have diverticulosis and hemorrhoids.  PCP [name]   Past Medical History:  Diagnosis Date  . CAD (coronary artery disease)   . Diverticulosis   . Hypercholesterolemia   . Hypertension     Patient Active Problem List   Diagnosis Date Noted  . GERD (gastroesophageal reflux disease) 06/08/2016  . Constipation 06/08/2016  . Mucosal abnormality of stomach   . Mucosal abnormality of esophagus   . History of colonic polyps   . Diverticulosis of colon without hemorrhage   . Dysphagia 11/27/2015  . Rectal bleeding 11/27/2015  . Abdominal pain 11/27/2015  . Tobacco abuse 07/27/2015  . Chest pain at rest     . NSTEMI (non-ST elevated myocardial infarction) ([place])   . ACS (acute coronary syndrome) ([place]) 07/12/2015  . Hyperlipidemia 07/12/2015  . Chest pain 07/11/2015  . Essential hypertension 07/11/2015  . LUMBAR SPRAIN AND STRAIN 06/08/2009    Past Surgical History:  Procedure Laterality Date  . CARDIAC CATHETERIZATION N/A 07/13/2015   Procedure: Left Heart Cath and Coronary Angiography;  Surgeon: Burnell Blanks, MD;  Location: [facility];  Service: Cardiovascular;  Laterality: N/A;  . CARDIAC CATHETERIZATION N/A 07/13/2015   PCI + DES to the mid circ. LVEF was normal at 65%  . COLONOSCOPY N/A 12/31/2015   Dr.Rourk- diverticulosis,33mm polyp in the splenic flexure, 84mm polyp in the splenic flexure, 51mm polyp in the sigmoid colon bx= traditional serrated adenoma  . ESOPHAGOGASTRODUODENOSCOPY N/A 12/31/2015   Dr.Rourk- esophagitis with no bleeding, diffuse moderately erythematous mucosa without bleeding was found in the entire examined stomach. stomach bx= slight chronic inflammation. esophagus bx= benign gastresophageal junction mucosa  . MALONEY DILATION N/A 12/31/2015   Procedure: Venia Minks DILATION;  Surgeon: [name], MD;  [facility];  Service: Endoscopy;  Laterality: N/A;       Home Medications    Prior to Admission medications   Medication Sig Start Date End Date Taking? Authorizing Provider  triamcinolone lotion (KENALOG) 0.1 % Apply 1 application topically 3 (three) times daily.   Yes Historical Provider, MD  varenicline (  CHANTIX PAK) 0.5 MG X 11 & 1 MG X 42 tablet Take by mouth 2 (two) times daily. Take one 0.5 mg tablet by mouth once daily for 3 days, then increase to one 0.5 mg tablet twice daily for 4 days, then increase to one 1 mg tablet twice daily.   Yes Historical Provider, MD  aspirin 81 MG EC tablet Take 1 tablet (81 mg total) by mouth daily. 07/14/15   [name], PA-C  atorvastatin (LIPITOR) 80 MG tablet TAKE 1 TABLET BY MOUTH EVERY  DAY AT 6 p.m. 07/13/16   [name], NP  clopidogrel (PLAVIX) 75 MG tablet TAKE 1 TABLET BY MOUTH EVERY DAY WITH BREAKFAST 03/14/16   [name], NP  enalapril (VASOTEC) 10 MG tablet TAKE 1 TABLET BY MOUTH EVERY DAY 07/13/16   [name], NP  Glycerin-Hypromellose-PEG 400 (VISINE TEARS OP) Apply 1 drop to eye daily.    Historical Provider, MD  hyoscyamine (LEVSIN SL) 0.125 MG SL tablet Place 1 tablet (0.125 mg total) under the tongue every 4 (four) hours as needed. 11/27/15   [name], NP  metoprolol tartrate (LOPRESSOR) 25 MG tablet Take 0.5 tablets (12.5 mg total) by mouth 2 (two) times daily. 07/19/16   [name], NP  RABEprazole (ACIPHEX) 20 MG tablet Take 1 tablet (20 mg total) by mouth daily. 02/22/16   [name], MD  sucralfate (CARAFATE) 1 g tablet Take 1 tablet (1 g total) by mouth 3 (three) times daily as needed. 06/08/16   [name], NP    Family History Family History  Problem Relation Age of Onset  . Heart attack Father 74    Deceased  . Colon cancer Neg Hx     Social History Social History  Substance Use Topics  . Smoking status: Current Some Day Smoker    Packs/day: 0.75    Years: 35.00    Types: Cigarettes  . Smokeless tobacco: Never Used  . Alcohol use No  smokes 1 ppd (down, is trying to quit).  Employed selling items online   Allergies   Patient has no known allergies.   Review of Systems Review of Systems  All other systems reviewed and are negative.    Physical Exam Updated Vital Signs BP 115/80 (BP Location: Left Arm)   Pulse 117   Temp 98.2 F (36.8 C) (Oral)   Resp 20   Ht 5\' 6"  (1.676 m)   Wt 155 lb (70.3 kg)   SpO2 97%   BMI 25.02 kg/m   Vital signs normal except for tachycardia   Physical Exam  Constitutional: He is oriented to person, place, and time. He appears well-developed and well-nourished.  Non-toxic appearance. He does not appear ill. No distress.  HENT:  Head: Normocephalic and  atraumatic.  Right Ear: External ear normal.  Left Ear: External ear normal.  Nose: Nose normal. No mucosal edema or rhinorrhea.  Mouth/Throat: Mucous membranes are normal. No dental abscesses or uvula swelling.  Dry tongue  Eyes: Conjunctivae and EOM are normal. Pupils are equal, round, and reactive to light.  Neck: Normal range of motion and full passive range of motion without pain. Neck supple.  Cardiovascular: Normal rate, regular rhythm and normal heart sounds.  Exam reveals no gallop and no friction rub.   No murmur heard. Pulmonary/Chest: Effort normal and breath sounds normal. No respiratory distress. He has no wheezes. He has no rhonchi. He has no rales. He exhibits no tenderness and  no crepitus.  Abdominal: Soft. Normal appearance and bowel sounds are normal. He exhibits no distension. There is tenderness. There is no rebound and no guarding.  Patient has diffuse abdominal pain to palpation but states it's worse in the umbilical area.  Musculoskeletal: Normal range of motion. He exhibits no edema or tenderness.  Moves all extremities well.   Neurological: He is alert and oriented to person, place, and time. He has normal strength. No cranial nerve deficit.  Skin: Skin is warm, dry and intact. No rash noted. No erythema. No pallor.  Psychiatric: He has a normal mood and affect. His speech is normal and behavior is normal. His mood appears not anxious.  Nursing note and vitals reviewed.    ED Treatments / Results  Labs (all labs ordered are listed, but only abnormal results are displayed) Results for orders placed or performed during the hospital encounter of 09/26/16  Comprehensive metabolic panel  Result Value Ref Range   Sodium 137 135 - 145 mmol/L   Potassium 3.9 3.5 - 5.1 mmol/L   Chloride 104 101 - 111 mmol/L   CO2 27 22 - 32 mmol/L   Glucose, Bld 131 (H) 65 - 99 mg/dL   BUN 19 6 - 20 mg/dL   Creatinine, Ser 0.87 0.61 - 1.24 mg/dL   Calcium 9.6 8.9 - 10.3 mg/dL    Total Protein 7.8 6.5 - 8.1 g/dL   Albumin 4.4 3.5 - 5.0 g/dL   AST 22 15 - 41 U/L   ALT 30 17 - 63 U/L   Alkaline Phosphatase 94 38 - 126 U/L   Total Bilirubin 1.0 0.3 - 1.2 mg/dL   GFR calc non Af Amer >60 >60 mL/min   GFR calc Af Amer >60 >60 mL/min   Anion gap 6 5 - 15  Lipase, blood  Result Value Ref Range   Lipase 40 11 - 51 U/L  CBC with Differential  Result Value Ref Range   WBC 9.1 4.0 - 10.5 K/uL   RBC 5.44 4.22 - 5.81 MIL/uL   Hemoglobin 17.2 (H) 13.0 - 17.0 g/dL   HCT 50.5 39.0 - 52.0 %   MCV 92.8 78.0 - 100.0 fL   MCH 31.6 26.0 - 34.0 pg   MCHC 34.1 30.0 - 36.0 g/dL   RDW 12.6 11.5 - 15.5 %   Platelets 203 150 - 400 K/uL   Neutrophils Relative % 89 %   Neutro Abs 8.1 (H) 1.7 - 7.7 K/uL   Lymphocytes Relative 6 %   Lymphs Abs 0.5 (L) 0.7 - 4.0 K/uL   Monocytes Relative 5 %   Monocytes Absolute 0.5 0.1 - 1.0 K/uL   Eosinophils Relative 0 %   Eosinophils Absolute 0.0 0.0 - 0.7 K/uL   Basophils Relative 0 %   Basophils Absolute 0.0 0.0 - 0.1 K/uL  Urinalysis, Routine w reflex microscopic  Result Value Ref Range   Color, Urine YELLOW YELLOW   APPearance CLEAR CLEAR   Specific Gravity, Urine >1.030 (H) 1.005 - 1.030   pH 5.5 5.0 - 8.0   Glucose, UA NEGATIVE NEGATIVE mg/dL   Hgb urine dipstick TRACE (A) NEGATIVE   Bilirubin Urine NEGATIVE NEGATIVE   Ketones, ur NEGATIVE NEGATIVE mg/dL   Protein, ur NEGATIVE NEGATIVE mg/dL   Nitrite NEGATIVE NEGATIVE   Leukocytes, UA NEGATIVE NEGATIVE  Urinalysis, Microscopic (reflex)  Result Value Ref Range   RBC / HPF 6-30 0 - 5 RBC/hpf   WBC, UA 0-5 0 - 5 WBC/hpf  Bacteria, UA MANY (A) NONE SEEN   Squamous Epithelial / LPF 0-5 (A) NONE SEEN   Mucous PRESENT    Laboratory interpretation all normal except concentrated urine consistent with dehydration, elevated hemoglobin consistent with dehydration, some microscopic hematuria      Radiology No results found.  Procedures Procedures (including critical care  time)  Medications Ordered in ED Medications  sodium chloride 0.9 % bolus 1,000 mL (0 mLs Intravenous Stopped 09/26/16 0430)  sodium chloride 0.9 % bolus 1,000 mL (0 mLs Intravenous Stopped 09/26/16 0430)  famotidine (PEPCID) IVPB 20 mg premix (0 mg Intravenous Stopped 09/26/16 0249)  sodium chloride 0.9 % bolus 1,000 mL (1,000 mLs Intravenous New Bag/Given 09/26/16 0539)  fentaNYL (SUBLIMAZE) injection 50 mcg (50 mcg Intravenous Given 09/26/16 0527)  iopamidol (ISOVUE-300) 61 % injection (30 mLs  Contrast Given 09/26/16 0738)  iopamidol (ISOVUE-300) 61 % injection 100 mL (100 mLs Intravenous Contrast Given 09/26/16 0738)     Initial Impression / Assessment and Plan / ED Course  I have reviewed the triage vital signs and the nursing notes.  Pertinent labs & imaging results that were available during my care of the patient were reviewed by me and considered in my medical decision making (see chart for details).  Clinical Course    Patient was started on IV fluids because he appeared to be dehydrated on exam.  5:20 AM patient was reexamined. He now only has abdominal pain in the right lower quadrant. Patient was given IV pain medications and CT scan of the abdomen/pelvis was ordered.  07: 45 AM patient turned over to Dr Reather Converse to get his CT results.    Final Clinical Impressions(s) / ED Diagnoses   Final diagnoses:  Right lower quadrant abdominal pain  Diarrhea, unspecified type  Dehydration    Disposition pending  Rolland Porter, MD, Barbette Or, MD 09/26/16 [phone]

## 2016-09-26 NOTE — ED Provider Notes (Signed)
Patient's care signed out to follow-up CT scan to rule out appendicitis. Patient clinically presented with multiple episodes of diarrhea and right-sided Bell discomfort. Patient did have antibiotics for upper rest her infection the past month. On exam patient having more frequent diarrhea, tachycardia 118. Plan for repeat IV fluids, lactate, C. difficile culture and observation. Discussed with [facility] hospitalist. CT scan results reviewed cannot rule out early bowel obstruction however patient passing gas and multiple diarrhea episodes plan to treat clinically and hospitalist can consult surgery if needed or no improvement.  Elnora Morrison Mariea Clonts, MD 09/26/16 [phone]

## 2016-09-26 NOTE — ED Triage Notes (Signed)
Pt c/o diarrhea that started at 10pm this past evening; pt states he has chills and has had some nausea

## 2016-09-27 ENCOUNTER — Inpatient Hospital Stay (HOSPITAL_COMMUNITY): Payer: Commercial Managed Care - PPO

## 2016-09-27 DIAGNOSIS — K5790 Diverticulosis of intestine, part unspecified, without perforation or abscess without bleeding: Secondary | ICD-10-CM | POA: Diagnosis present

## 2016-09-27 DIAGNOSIS — Z7982 Long term (current) use of aspirin: Secondary | ICD-10-CM | POA: Diagnosis not present

## 2016-09-27 DIAGNOSIS — I2510 Atherosclerotic heart disease of native coronary artery without angina pectoris: Secondary | ICD-10-CM | POA: Diagnosis present

## 2016-09-27 DIAGNOSIS — E860 Dehydration: Secondary | ICD-10-CM | POA: Diagnosis present

## 2016-09-27 DIAGNOSIS — F17210 Nicotine dependence, cigarettes, uncomplicated: Secondary | ICD-10-CM | POA: Diagnosis present

## 2016-09-27 DIAGNOSIS — A0819 Acute gastroenteropathy due to other small round viruses: Secondary | ICD-10-CM | POA: Diagnosis present

## 2016-09-27 DIAGNOSIS — E7800 Pure hypercholesterolemia, unspecified: Secondary | ICD-10-CM | POA: Diagnosis present

## 2016-09-27 DIAGNOSIS — I10 Essential (primary) hypertension: Secondary | ICD-10-CM | POA: Diagnosis present

## 2016-09-27 DIAGNOSIS — K529 Noninfective gastroenteritis and colitis, unspecified: Secondary | ICD-10-CM | POA: Diagnosis present

## 2016-09-27 DIAGNOSIS — Z8249 Family history of ischemic heart disease and other diseases of the circulatory system: Secondary | ICD-10-CM | POA: Diagnosis not present

## 2016-09-27 DIAGNOSIS — Z7902 Long term (current) use of antithrombotics/antiplatelets: Secondary | ICD-10-CM | POA: Diagnosis not present

## 2016-09-27 DIAGNOSIS — R000 Tachycardia, unspecified: Secondary | ICD-10-CM | POA: Diagnosis present

## 2016-09-27 DIAGNOSIS — Z79899 Other long term (current) drug therapy: Secondary | ICD-10-CM | POA: Diagnosis not present

## 2016-09-27 DIAGNOSIS — Z955 Presence of coronary angioplasty implant and graft: Secondary | ICD-10-CM | POA: Diagnosis not present

## 2016-09-27 LAB — GASTROINTESTINAL PANEL BY PCR, STOOL (REPLACES STOOL CULTURE)

## 2016-09-27 LAB — BASIC METABOLIC PANEL
ANION GAP: 6 (ref 5–15)
BUN: 14 mg/dL (ref 6–20)
CALCIUM: 8.6 mg/dL — AB (ref 8.9–10.3)
CO2: 26 mmol/L (ref 22–32)
CREATININE: 0.89 mg/dL (ref 0.61–1.24)
Chloride: 107 mmol/L (ref 101–111)
GLUCOSE: 73 mg/dL (ref 65–99)
Potassium: 3.9 mmol/L (ref 3.5–5.1)
Sodium: 139 mmol/L (ref 135–145)

## 2016-09-27 LAB — CBC
HCT: 44.4 % (ref 39.0–52.0)
Hemoglobin: 14.4 g/dL (ref 13.0–17.0)
MCH: 30.4 pg (ref 26.0–34.0)
MCHC: 32.4 g/dL (ref 30.0–36.0)
MCV: 93.9 fL (ref 78.0–100.0)
PLATELETS: 158 10*3/uL (ref 150–400)
RBC: 4.73 MIL/uL (ref 4.22–5.81)
RDW: 12.7 % (ref 11.5–15.5)
WBC: 4.6 10*3/uL (ref 4.0–10.5)

## 2016-09-27 MED ORDER — NICOTINE 14 MG/24HR TD PT24
14.0000 mg | MEDICATED_PATCH | Freq: Every day | TRANSDERMAL | Status: DC
  Administered 2016-09-27 – 2016-09-28 (×2): 14 mg via TRANSDERMAL
  Filled 2016-09-27 (×2): qty 1

## 2016-09-27 NOTE — Progress Notes (Signed)
PROGRESS NOTE    Anthony Hensley  [id number] DOB: 1961/07/08 DOA: 09/26/2016 PCP: [name]   Brief Narrative: Anthony Hensley is a 55 y.o. male with medical history significant of CAD S/P stent 2016, HTN who presents complaining of abdominal pain, right side, constant, severity fluctuates from 5 to 10. He also report Diarrhea. CT abdomen pelvis: Multiple loops above fluid-filled small and large bowel, likely indicative of a degree of enterocolitis. There is a degree of small bowel dilatation with a transition zone near the jejunoileal junction, concerning for early bowel obstruction. No free air. No abscess. Appendix appears normal.  Assessment & Plan:   Active Problems:   Essential hypertension   Hyperlipidemia   Diarrhea   Enteritis   1-Enteritis vs early obstruction by CT scan.  Diarrhea resolved yesterday. Pain is better. denies abdominal pain this morning.  Continue with IV fluids.  Follow KUB.  Start clear diet today.  C diff negative.  On flagyl.  If x ray negative and tolerates clear diet in am. Could advance diet to full liquid.   2-CAD; continue with Plavix, metoprolol.  3-HTN; hold lisinopril.Marland Kitchen  4-HLD; hold statins until tolerating diet.     DVT prophylaxis: lovenox.  Code Status: full code.  Family Communication: care discussed with patient.  Disposition Plan: patient will remain in the hospital to make sure he tolerates diet. Needs to follow KUB to make sure no SBO.   Consultants:   none   Procedures:   none  Antimicrobials:   Ciprofloxacin  Flagyl.    Subjective: He is feeling better, no significant abdominal pain.  Last BM yesterday, no further diarrhea  Objective: Vitals:   09/26/16 1514 09/26/16 2100 09/27/16 0500 09/27/16 0636  BP: 124/82 109/68  110/85  Pulse: (!) 108 95  94  Resp: 20 16  18   Temp: 99.3 F (37.4 C) 98.6 F (37 C)  98.3 F (36.8 C)  TempSrc: Oral Oral  Oral  SpO2: 94% 96%  94%  Weight: 74.8 kg (165  lb)  74.3 kg (163 lb 12.8 oz) 73.1 kg (161 lb 1.6 oz)  Height: 5\' 6"  (1.676 m)       Intake/Output Summary (Last 24 hours) at 09/27/16 0849 Last data filed at 09/27/16 [id number]  Gross per 24 hour  Intake           518.33 ml  Output             1200 ml  Net          -681.67 ml   Filed Weights   09/26/16 1514 09/27/16 0500 09/27/16 0636  Weight: 74.8 kg (165 lb) 74.3 kg (163 lb 12.8 oz) 73.1 kg (161 lb 1.6 oz)    Examination:  General exam: Appears calm and comfortable  Respiratory system: Clear to auscultation. Respiratory effort normal. Cardiovascular system: S1 & S2 heard, RRR. No JVD, murmurs, rubs, gallops or clicks. No pedal edema. Gastrointestinal system: Abdomen is nondistended, soft and nontender. No organomegaly or masses felt. Normal bowel sounds heard. Central nervous system: Alert and oriented. No focal neurological deficits. Extremities: Symmetric 5 x 5 power. Skin: No rashes, lesions or ulcers Psychiatry: Judgement and insight appear normal. Mood & affect appropriate.     Data Reviewed: I have personally reviewed following labs and imaging studies  CBC:  Recent Labs Lab 09/26/16 0139 09/27/16 0749  WBC 9.1 4.6  NEUTROABS 8.1*  --   HGB 17.2* 14.4  HCT 50.5 44.4  MCV 92.8 93.9  PLT 203 0000000   Basic Metabolic Panel:  Recent Labs Lab 09/26/16 0139 09/27/16 0749  NA 137 139  K 3.9 3.9  CL 104 107  CO2 27 26  GLUCOSE 131* 73  BUN 19 14  CREATININE 0.87 0.89  CALCIUM 9.6 8.6*   GFR: Estimated Creatinine Clearance: 86.6 mL/min (by C-G formula based on SCr of 0.89 mg/dL). Liver Function Tests:  Recent Labs Lab 09/26/16 0139  AST 22  ALT 30  ALKPHOS 94  BILITOT 1.0  PROT 7.8  ALBUMIN 4.4    Recent Labs Lab 09/26/16 0139  LIPASE 40   No results for input(s): AMMONIA in the last 168 hours. Coagulation Profile: No results for input(s): INR, PROTIME in the last 168 hours. Cardiac Enzymes: No results for input(s): CKTOTAL, CKMB, CKMBINDEX,  TROPONINI in the last 168 hours. BNP (last 3 results) No results for input(s): PROBNP in the last 8760 hours. HbA1C: No results for input(s): HGBA1C in the last 72 hours. CBG: No results for input(s): GLUCAP in the last 168 hours. Lipid Profile: No results for input(s): CHOL, HDL, LDLCALC, TRIG, CHOLHDL, LDLDIRECT in the last 72 hours. Thyroid Function Tests: No results for input(s): TSH, T4TOTAL, FREET4, T3FREE, THYROIDAB in the last 72 hours. Anemia Panel: No results for input(s): VITAMINB12, FOLATE, FERRITIN, TIBC, IRON, RETICCTPCT in the last 72 hours. Sepsis Labs:  Recent Labs Lab 09/26/16 1312  LATICACIDVEN 0.56    Recent Results (from the past 240 hour(s))  C difficile quick scan w PCR reflex     Status: None   Collection Time: 09/26/16  1:26 PM  Result Value Ref Range Status   C Diff antigen NEGATIVE NEGATIVE Final   C Diff toxin NEGATIVE NEGATIVE Final   C Diff interpretation No C. difficile detected.  Final         Radiology Studies: Ct Abdomen Pelvis W Contrast  Result Date: 09/26/2016 CLINICAL DATA:  Abdominal pain and diarrhea EXAM: CT ABDOMEN AND PELVIS WITH CONTRAST TECHNIQUE: Multidetector CT imaging of the abdomen and pelvis was performed using the standard protocol following bolus administration of intravenous contrast. Oral contrast was also administered. CONTRAST:  147mL ISOVUE-300 IOPAMIDOL (ISOVUE-300) INJECTION 61% COMPARISON:  November 09, 2015 FINDINGS: Lower chest: Lung bases are clear. Hepatobiliary: No focal liver lesions are evident. Gallbladder wall is not appreciably thickened. There is no biliary duct dilatation. Pancreas: There is no pancreatic mass or inflammatory focus. Spleen: Splenic lesions are evident. Adrenals/Urinary Tract: Adrenals appear normal bilaterally. There is a 9 x 7 mm cyst in the mid left kidney. There is no hydronephrosis on either side. There is no renal or ureteral calculus on either side. Urinary bladder is midline with wall  thickness within normal limits. Stomach/Bowel: There is small bowel dilatation with fluid throughout much small bowel. There is a relative transition zone near the jejunoileal junction, concerning for a degree of bowel obstruction. No free air or portal venous air is evident. The mesentery is not appreciably thickened. There is fluid throughout much of the colon. Vascular/Lymphatic: There is atherosclerotic calcification in the aorta and right common iliac artery. There is no abdominal aortic aneurysm. Major mesenteric vessels appear patent. There is no appreciable adenopathy in the abdomen or pelvis. Reproductive: Prostate and seminal vesicles appear normal in size and contour. There is no pelvic mass or pelvic fluid collection. Other: Appendix appears normal. There is no ascites or abscess in the abdomen or pelvis. There is a minimal ventral hernia containing only fat. Musculoskeletal: There is a  stable exostosis arising from the lateral upper left iliac crest, stable. There are no blastic or lytic bone lesions. There is no intramuscular or abdominal wall lesion. IMPRESSION: Multiple loops above fluid-filled small and large bowel, likely indicative of a degree of enterocolitis. There is a degree of small bowel dilatation with a transition zone near the jejunoileal junction, concerning for early bowel obstruction. No free air. No abscess.  Appendix appears normal. No renal or ureteral calculus.  No hydronephrosis. There is aortoiliac atherosclerosis. Rather minimal ventral hernia present containing only fat. Electronically Signed   By: Lowella Grip III M.D.   On: 09/26/2016 08:03        Scheduled Meds: . clopidogrel  75 mg Oral Daily  . enoxaparin (LOVENOX) injection  40 mg Subcutaneous Q24H  . metoprolol tartrate  12.5 mg Oral BID  . metroNIDAZOLE  500 mg Oral Q8H  . nicotine  14 mg Transdermal Daily   Continuous Infusions: . 0.9 % NaCl with KCl 20 mEq / L 100 mL/hr at 09/26/16 2102     LOS:  0 days    Time spent: 35 minutes     Elmarie Shiley, MD [facility] Pager [phone]  If 7PM-7AM, please contact night-coverage [email or web address] Password [facility] 09/27/2016, 8:49 AM

## 2016-09-27 NOTE — Progress Notes (Signed)
CRITICAL VALUE ALERT  Critical value received:  GI Panel PCR, positive for Norovirus  Date of notification:  09/27/2016  Time of notification:  [id number]  Critical value read back:Yes.    Nurse who received alert:  Tacey Heap RN  MD notified (1st page):  [place], via AMION  Time of first page:  [phone]

## 2016-09-28 MED ORDER — NICOTINE 14 MG/24HR TD PT24: 14.0000 mg | MEDICATED_PATCH | Freq: Every day | TRANSDERMAL | 0 refills | Status: DC

## 2016-09-28 NOTE — Discharge Summary (Signed)
Physician Discharge Summary  Anthony Hensley [id number] DOB: 04/25/1961 DOA: 09/26/2016  PCP: [name]  Admit date: 09/26/2016 Discharge date: 09/28/2016  Admitted From: Home  Disposition: Home   Recommendations for Outpatient Follow-up:  1. Follow up with PCP in 1-2 weeks 2. Please obtain BMP/CBC in one week 3.     Discharge Condition: Stable.  CODE STATUS: Full code.  Diet recommendation: Heart Healthy  Brief/Interim Summary:  Brief Narrative: Anthony Hensley a 55 y.o.malewith medical history significant of CAD S/P stent 2016, HTN who presents complaining of abdominal pain, right side, constant, severity fluctuates from 5 to 10. He also report Diarrhea. CT abdomen pelvis: Multiple loops above fluid-filled small and large bowel, likely indicative of a degree of enterocolitis. There is a degree of small bowel dilatation with a transition zone near the jejunoileal junction, concerning for early bowel obstruction. No free air. No abscess. Appendix appears normal.  Assessment & Plan:   Enteritis, norovirus.    Essential hypertension   Hyperlipidemia   Diarrhea    1-Enteritis , Norovirus.  Diarrhea resolved yesterday. Pain is better. denies abdominal pain this morning.  Continue with IV fluids.  KUB negative for obstruction.  C diff negative.  Diarrhea has resolved, he is tolerating diet  GI pathogen positive for Norovirus.   2-CAD;continue with Plavix, metoprolol.  3-HTN; hold lisinopril.Marland Kitchen  4-HLD; resume statins at discharge.    Discharge Diagnoses:  Active Problems:   Essential hypertension   Hyperlipidemia   Diarrhea   Enteritis    Discharge Instructions  Discharge Instructions    Diet - low sodium heart healthy    Complete by:  As directed    Increase activity slowly    Complete by:  As directed      Allergies as of 09/28/2016   No Known Allergies     Medication List    STOP taking these medications   enalapril 10 MG  tablet Commonly known as:  VASOTEC     TAKE these medications   aspirin 81 MG EC tablet Take 1 tablet (81 mg total) by mouth daily.   atorvastatin 80 MG tablet Commonly known as:  LIPITOR TAKE 1 TABLET BY MOUTH EVERY DAY AT 6 p.m.   clopidogrel 75 MG tablet Commonly known as:  PLAVIX TAKE 1 TABLET BY MOUTH EVERY DAY WITH BREAKFAST   metoprolol tartrate 25 MG tablet Commonly known as:  LOPRESSOR Take 0.5 tablets (12.5 mg total) by mouth 2 (two) times daily.   nicotine 14 mg/24hr patch Commonly known as:  NICODERM CQ - dosed in mg/24 hours Place 1 patch (14 mg total) onto the skin daily.   NITROSTAT 0.4 MG SL tablet Generic drug:  nitroGLYCERIN Place 1 tablet under the tongue daily as needed for chest pain.   PROAIR HFA 108 (90 Base) MCG/ACT inhaler Generic drug:  albuterol Inhale 2 puffs into the lungs every 4 (four) hours as needed for shortness of breath.   albuterol (2.5 MG/3ML) 0.083% nebulizer solution Commonly known as:  PROVENTIL Take 3 mLs by nebulization daily as needed for shortness of breath.   RABEprazole 20 MG tablet Commonly known as:  ACIPHEX Take 1 tablet (20 mg total) by mouth daily.   triamcinolone lotion 0.1 % Commonly known as:  KENALOG Apply 1 application topically 2 (two) times daily as needed (rash).   varenicline 0.5 MG X 11 & 1 MG X 42 tablet Commonly known as:  CHANTIX PAK Take 1 mg by mouth 2 (two) times daily.  VISINE TEARS OP Apply 1 drop to eye daily as needed (dry eyes).       No Known Allergies  Consultations:  none   Procedures/Studies: Dg Abd 1 View  Result Date: 09/27/2016 CLINICAL DATA:  Right-sided abdominal pain EXAM: ABDOMEN - 1 VIEW COMPARISON:  None. FINDINGS: The bowel gas pattern is normal. No radio-opaque calculi or other significant radiographic abnormality are seen. IMPRESSION: No bowel obstruction. Electronically Signed   By: Kathreen Devoid   On: 09/27/2016 11:21   Ct Abdomen Pelvis W Contrast  Result  Date: 09/26/2016 CLINICAL DATA:  Abdominal pain and diarrhea EXAM: CT ABDOMEN AND PELVIS WITH CONTRAST TECHNIQUE: Multidetector CT imaging of the abdomen and pelvis was performed using the standard protocol following bolus administration of intravenous contrast. Oral contrast was also administered. CONTRAST:  167mL ISOVUE-300 IOPAMIDOL (ISOVUE-300) INJECTION 61% COMPARISON:  November 09, 2015 FINDINGS: Lower chest: Lung bases are clear. Hepatobiliary: No focal liver lesions are evident. Gallbladder wall is not appreciably thickened. There is no biliary duct dilatation. Pancreas: There is no pancreatic mass or inflammatory focus. Spleen: Splenic lesions are evident. Adrenals/Urinary Tract: Adrenals appear normal bilaterally. There is a 9 x 7 mm cyst in the mid left kidney. There is no hydronephrosis on either side. There is no renal or ureteral calculus on either side. Urinary bladder is midline with wall thickness within normal limits. Stomach/Bowel: There is small bowel dilatation with fluid throughout much small bowel. There is a relative transition zone near the jejunoileal junction, concerning for a degree of bowel obstruction. No free air or portal venous air is evident. The mesentery is not appreciably thickened. There is fluid throughout much of the colon. Vascular/Lymphatic: There is atherosclerotic calcification in the aorta and right common iliac artery. There is no abdominal aortic aneurysm. Major mesenteric vessels appear patent. There is no appreciable adenopathy in the abdomen or pelvis. Reproductive: Prostate and seminal vesicles appear normal in size and contour. There is no pelvic mass or pelvic fluid collection. Other: Appendix appears normal. There is no ascites or abscess in the abdomen or pelvis. There is a minimal ventral hernia containing only fat. Musculoskeletal: There is a stable exostosis arising from the lateral upper left iliac crest, stable. There are no blastic or lytic bone lesions.  There is no intramuscular or abdominal wall lesion. IMPRESSION: Multiple loops above fluid-filled small and large bowel, likely indicative of a degree of enterocolitis. There is a degree of small bowel dilatation with a transition zone near the jejunoileal junction, concerning for early bowel obstruction. No free air. No abscess.  Appendix appears normal. No renal or ureteral calculus.  No hydronephrosis. There is aortoiliac atherosclerosis. Rather minimal ventral hernia present containing only fat. Electronically Signed   By: Lowella Grip III M.D.   On: 09/26/2016 08:03       Subjective: Feeling better, no abdominal pain, diarrhea has resolved   Discharge Exam: Vitals:   09/27/16 1331 09/27/16 2300  BP: 123/78 118/76  Pulse: 68 98  Resp: 20 17  Temp: 97.7 F (36.5 C) 97.6 F (36.4 C)   Vitals:   09/27/16 0500 09/27/16 0636 09/27/16 1331 09/27/16 2300  BP:  110/85 123/78 118/76  Pulse:  94 68 98  Resp:  18 20 17   Temp:  98.3 F (36.8 C) 97.7 F (36.5 C) 97.6 F (36.4 C)  TempSrc:  Oral Oral Oral  SpO2:  94% 97% 100%  Weight: 74.3 kg (163 lb 12.8 oz) 73.1 kg (161 lb 1.6 oz)  Height:        General: Pt is alert, awake, not in acute distress Cardiovascular: RRR, S1/S2 +, no rubs, no gallops Respiratory: CTA bilaterally, no wheezing, no rhonchi Abdominal: Soft, NT, ND, bowel sounds + Extremities: no edema, no cyanosis    The results of significant diagnostics from this hospitalization (including imaging, microbiology, ancillary and laboratory) are listed below for reference.     Microbiology: Recent Results (from the past 240 hour(s))  C difficile quick scan w PCR reflex     Status: None   Collection Time: 09/26/16  1:26 PM  Result Value Ref Range Status   C Diff antigen NEGATIVE NEGATIVE Final   C Diff toxin NEGATIVE NEGATIVE Final   C Diff interpretation No C. difficile detected.  Final  Gastrointestinal Panel by PCR , Stool     Status: Abnormal   Collection  Time: 09/26/16  1:26 PM  Result Value Ref Range Status   Campylobacter species NOT DETECTED NOT DETECTED Final   Plesimonas shigelloides NOT DETECTED NOT DETECTED Final   Salmonella species NOT DETECTED NOT DETECTED Final   Yersinia enterocolitica NOT DETECTED NOT DETECTED Final   Vibrio species NOT DETECTED NOT DETECTED Final   Vibrio cholerae NOT DETECTED NOT DETECTED Final   Enteroaggregative E coli (EAEC) NOT DETECTED NOT DETECTED Final   Enteropathogenic E coli (EPEC) NOT DETECTED NOT DETECTED Final   Enterotoxigenic E coli (ETEC) NOT DETECTED NOT DETECTED Final   Shiga like toxin producing E coli (STEC) NOT DETECTED NOT DETECTED Final   Shigella/Enteroinvasive E coli (EIEC) NOT DETECTED NOT DETECTED Final   Cryptosporidium NOT DETECTED NOT DETECTED Final   Cyclospora cayetanensis NOT DETECTED NOT DETECTED Final   Entamoeba histolytica NOT DETECTED NOT DETECTED Final   Giardia lamblia NOT DETECTED NOT DETECTED Final   Adenovirus F40/41 NOT DETECTED NOT DETECTED Final   Astrovirus NOT DETECTED NOT DETECTED Final   Norovirus GI/GII DETECTED (A) NOT DETECTED Final    Comment: RESULT CALLED TO, READ BACK BY AND VERIFIED WITH: BECKY BROWER 09/27/16 @ 1458  MLK    Rotavirus A NOT DETECTED NOT DETECTED Final   Sapovirus (I, II, IV, and V) NOT DETECTED NOT DETECTED Final     Labs: BNP (last 3 results) No results for input(s): BNP in the last 8760 hours. Basic Metabolic Panel:  Recent Labs Lab 09/26/16 0139 09/27/16 0749  NA 137 139  K 3.9 3.9  CL 104 107  CO2 27 26  GLUCOSE 131* 73  BUN 19 14  CREATININE 0.87 0.89  CALCIUM 9.6 8.6*   Liver Function Tests:  Recent Labs Lab 09/26/16 0139  AST 22  ALT 30  ALKPHOS 94  BILITOT 1.0  PROT 7.8  ALBUMIN 4.4    Recent Labs Lab 09/26/16 0139  LIPASE 40   No results for input(s): AMMONIA in the last 168 hours. CBC:  Recent Labs Lab 09/26/16 0139 09/27/16 0749  WBC 9.1 4.6  NEUTROABS 8.1*  --   HGB 17.2* 14.4   HCT 50.5 44.4  MCV 92.8 93.9  PLT 203 158   Cardiac Enzymes: No results for input(s): CKTOTAL, CKMB, CKMBINDEX, TROPONINI in the last 168 hours. BNP: Invalid input(s): POCBNP CBG: No results for input(s): GLUCAP in the last 168 hours. D-Dimer No results for input(s): DDIMER in the last 72 hours. Hgb A1c No results for input(s): HGBA1C in the last 72 hours. Lipid Profile No results for input(s): CHOL, HDL, LDLCALC, TRIG, CHOLHDL, LDLDIRECT in the last 72 hours. Thyroid  function studies No results for input(s): TSH, T4TOTAL, T3FREE, THYROIDAB in the last 72 hours.  Invalid input(s): FREET3 Anemia work up No results for input(s): VITAMINB12, FOLATE, FERRITIN, TIBC, IRON, RETICCTPCT in the last 72 hours. Urinalysis    Component Value Date/Time   COLORURINE YELLOW 09/26/2016 0245   APPEARANCEUR CLEAR 09/26/2016 0245   LABSPEC >1.030 (H) 09/26/2016 0245   PHURINE 5.5 09/26/2016 0245   GLUCOSEU NEGATIVE 09/26/2016 0245   HGBUR TRACE (A) 09/26/2016 0245   BILIRUBINUR NEGATIVE 09/26/2016 0245   KETONESUR NEGATIVE 09/26/2016 0245   PROTEINUR NEGATIVE 09/26/2016 0245   UROBILINOGEN 0.2 06/01/2015 2320   NITRITE NEGATIVE 09/26/2016 0245   LEUKOCYTESUR NEGATIVE 09/26/2016 0245   Sepsis Labs Invalid input(s): PROCALCITONIN,  WBC,  LACTICIDVEN Microbiology Recent Results (from the past 240 hour(s))  C difficile quick scan w PCR reflex     Status: None   Collection Time: 09/26/16  1:26 PM  Result Value Ref Range Status   C Diff antigen NEGATIVE NEGATIVE Final   C Diff toxin NEGATIVE NEGATIVE Final   C Diff interpretation No C. difficile detected.  Final  Gastrointestinal Panel by PCR , Stool     Status: Abnormal   Collection Time: 09/26/16  1:26 PM  Result Value Ref Range Status   Campylobacter species NOT DETECTED NOT DETECTED Final   Plesimonas shigelloides NOT DETECTED NOT DETECTED Final   Salmonella species NOT DETECTED NOT DETECTED Final   Yersinia enterocolitica NOT  DETECTED NOT DETECTED Final   Vibrio species NOT DETECTED NOT DETECTED Final   Vibrio cholerae NOT DETECTED NOT DETECTED Final   Enteroaggregative E coli (EAEC) NOT DETECTED NOT DETECTED Final   Enteropathogenic E coli (EPEC) NOT DETECTED NOT DETECTED Final   Enterotoxigenic E coli (ETEC) NOT DETECTED NOT DETECTED Final   Shiga like toxin producing E coli (STEC) NOT DETECTED NOT DETECTED Final   Shigella/Enteroinvasive E coli (EIEC) NOT DETECTED NOT DETECTED Final   Cryptosporidium NOT DETECTED NOT DETECTED Final   Cyclospora cayetanensis NOT DETECTED NOT DETECTED Final   Entamoeba histolytica NOT DETECTED NOT DETECTED Final   Giardia lamblia NOT DETECTED NOT DETECTED Final   Adenovirus F40/41 NOT DETECTED NOT DETECTED Final   Astrovirus NOT DETECTED NOT DETECTED Final   Norovirus GI/GII DETECTED (A) NOT DETECTED Final    Comment: RESULT CALLED TO, READ BACK BY AND VERIFIED WITH: BECKY BROWER 09/27/16 @ 1458  MLK    Rotavirus A NOT DETECTED NOT DETECTED Final   Sapovirus (I, II, IV, and V) NOT DETECTED NOT DETECTED Final     Time coordinating discharge: Over 30 minutes  SIGNED:   Elmarie Shiley, MD  [facility] 09/28/2016, 8:35 AM Pager   If 7PM-7AM, please contact night-coverage [email or web address] Password [email or web address]

## 2016-09-28 NOTE — Progress Notes (Signed)
Patient discharged with instructions, prescription, and care notes.  Verbalized understanding via teach back.  IV was removed and the site was WNL. Patient voiced no further complaints or concerns at the time of discharge.  Appointments scheduled per instructions.  Patient left the floor.

## 2016-09-28 NOTE — Care Management Note (Signed)
Case Management Note  Patient Details  Name: Anthony Hensley MRN: [id number] Date of Birth: 22-Sep-1961    Expected Discharge Date:       09/28/2016           Expected Discharge Plan:  Home/Self Care  In-House Referral:  NA  Discharge planning Services  CM Consult  Post Acute Care Choice:    Choice offered to:     DME Arranged:    DME Agency:     HH Arranged:    [place] Agency:     Status of Service:  Completed, signed off  If discussed at [employer] of Stay Meetings, dates discussed:    Additional Comments: Patient discharging home today. No CM needs.   Janard Culp, Chauncey Reading, RN 09/28/2016, 8:41 AM

## 2016-10-13 NOTE — Progress Notes (Addendum)
Cardiology Office Note   Date:  10/14/2016   ID:  Anthony Hensley, DOB [birth year], MRN [id number]  PCP:  [name]  Cardiologist:  Woodroe Chen, NP   No chief complaint on file.     History of Present Illness: Anthony Hensley is a 56 y.o. male who presents for ongoing assessment and management of CAD, status post stent in 2016, hypertension, who was recently discharged from hospital after admission for abdominal pain he was diagnosed with enteritis/normal virus.. No cardiac medications were adjusted and he was continued on Plavix metoprolol. Lisinopril was placed on hold due to hypotension associated with dehydration.  He comes today with multiple somatic complaints. Chronic chest pain, dyspnea, and fatigue. He feels better from recent GI virus but now has new complaints. Unfortunately the patient continues to smoke. He has not needed to take any nitroglycerin sublingual. He is medically compliant. He was taken off the lisinopril and metoprolol was decreased in the setting of hypotension with dehydration during hospitalization.  Past Medical History:  Diagnosis Date  . CAD (coronary artery disease)   . Diverticulosis   . Hypercholesterolemia   . Hypertension     Past Surgical History:  Procedure Laterality Date  . CARDIAC CATHETERIZATION N/A 07/13/2015   Procedure: Left Heart Cath and Coronary Angiography;  Surgeon: Burnell Blanks, MD;  Location: [facility];  Service: Cardiovascular;  Laterality: N/A;  . CARDIAC CATHETERIZATION N/A 07/13/2015   PCI + DES to the mid circ. LVEF was normal at 65%  . COLONOSCOPY N/A 12/31/2015   Dr.Rourk- diverticulosis,40mm polyp in the splenic flexure, 60mm polyp in the splenic flexure, 52mm polyp in the sigmoid colon bx= traditional serrated adenoma  . ESOPHAGOGASTRODUODENOSCOPY N/A 12/31/2015   Dr.Rourk- esophagitis with no bleeding, diffuse moderately erythematous mucosa without bleeding was found in the entire  examined stomach. stomach bx= slight chronic inflammation. esophagus bx= benign gastresophageal junction mucosa  . MALONEY DILATION N/A 12/31/2015   Procedure: Venia Minks DILATION;  Surgeon: [name], MD;  Location[facility];  Service: Endoscopy;  Laterality: N/A;     Current Outpatient Prescriptions  Medication Sig Dispense Refill  . albuterol (PROVENTIL) (2.5 MG/3ML) 0.083% nebulizer solution Take 3 mLs by nebulization daily as needed for shortness of breath.  3  . aspirin 81 MG EC tablet Take 1 tablet (81 mg total) by mouth daily. 30 tablet   . atorvastatin (LIPITOR) 80 MG tablet TAKE 1 TABLET BY MOUTH EVERY DAY AT 6 p.m. 30 tablet 3  . clopidogrel (PLAVIX) 75 MG tablet TAKE 1 TABLET BY MOUTH EVERY DAY WITH BREAKFAST 30 tablet 11  . Glycerin-Hypromellose-PEG 400 (VISINE TEARS OP) Apply 1 drop to eye daily as needed (dry eyes).     . metoprolol tartrate (LOPRESSOR) 25 MG tablet Take 0.5 tablets (12.5 mg total) by mouth 2 (two) times daily. 90 tablet 3  . nicotine (NICODERM CQ - DOSED IN MG/24 HOURS) 14 mg/24hr patch Place 1 patch (14 mg total) onto the skin daily. 28 patch 0  . NITROSTAT 0.4 MG SL tablet Place 1 tablet under the tongue daily as needed for chest pain.  1  . PROAIR HFA 108 (90 Base) MCG/ACT inhaler Inhale 2 puffs into the lungs every 4 (four) hours as needed for shortness of breath.  3  . RABEprazole (ACIPHEX) 20 MG tablet Take 1 tablet (20 mg total) by mouth daily. 30 tablet 11  . triamcinolone lotion (KENALOG) 0.1 % Apply 1 application topically 2 (two) times  daily as needed (rash).     . varenicline (CHANTIX PAK) 0.5 MG X 11 & 1 MG X 42 tablet Take 1 mg by mouth 2 (two) times daily.      No current facility-administered medications for this visit.     Allergies:   Patient has no known allergies.    Social History:  The patient  reports that he has been smoking Cigarettes.  He has a 26.25 pack-year smoking history. He has never used smokeless tobacco. He reports  that he does not drink alcohol or use drugs.   Family History:  The patient's family history includes Heart attack (age of onset: 41) in his father.    ROS: All other systems are reviewed and negative. Unless otherwise mentioned in H&P    PHYSICAL EXAM: VS:  BP 140/84   Pulse 64   Wt 161 lb (73 kg)   SpO2 99%   BMI 25.99 kg/m  , BMI Body mass index is 25.99 kg/m. GEN: Well nourished, well developed, in no acute distress  HEENT: normal  Neck: no JVD, carotid bruits, or masses Cardiac: RRR; no murmurs, rubs, or gallops,no edema  Respiratory:  clear to auscultation bilaterally, normal work of breathing GI: soft, nontender, nondistended, + BS MS: no deformity or atrophy  Skin: warm and dry, no rash Neuro:  Strength and sensation are intact Psych: euthymic mood, full affect   Recent Labs: 02/25/2016: Magnesium 1.9 09/26/2016: ALT 30 09/27/2016: BUN 14; Creatinine, Ser 0.89; Hemoglobin 14.4; Platelets 158; Potassium 3.9; Sodium 139    Lipid Panel    Component Value Date/Time   CHOL 209 (H) 07/12/2015 0615   TRIG 126 07/12/2015 0615   HDL 34 (L) 07/12/2015 0615   CHOLHDL 6.1 07/12/2015 0615   VLDL 25 07/12/2015 0615   LDLCALC 150 (H) 07/12/2015 0615      Wt Readings from Last 3 Encounters:  10/14/16 161 lb (73 kg)  09/27/16 161 lb 1.6 oz (73.1 kg)  06/08/16 163 lb 12.8 oz (74.3 kg)      Other studies Reviewed: Additional studies/ records that were reviewed today include: . Echocardiogram:07/14/2015 Left ventricle: The cavity size was normal. Systolic function was   normal. The estimated ejection fraction was in the range of 55%   to 60%. Wall motion was normal; there were no regional wall   motion abnormalities. - Aortic valve: Trileaflet; mildly thickened, mildly calcified   leaflets. There was mild regurgitation.  Cardiac Cath 07/13/2015 Conclusion    Prox RCA lesion, 20% stenosed.  Dist Cx lesion, 70% stenosed.  Prox LAD-1 lesion, 20% stenosed.  Prox  LAD-2 lesion, 20% stenosed.  Mid LAD lesion, 20% stenosed.  1st Diag lesion, 30% stenosed.  Mid Cx lesion, 99% stenosed. There is a 0% residual stenosis post intervention.  A drug-eluting stent was placed extending from the mid Circumfelx into the Obtuse marginal branch  The left ventricular systolic function is normal.   1. NSTEMI/Unstable angina secondary to severe stenosis mid Circumflex.  2. Successful PTCA/DES x 1 mid Circumflex 3. Normal LV systolic function     ASSESSMENT AND PLAN:  1.  Coronary artery disease: History of drug-eluting stent to the mid circumflex again during a 99% stenosis, drug-eluting stent into the obtuse marginal branch. Distal circumflex disease was noted on most recent cardiac catheterization 2016. The patient continues to smoke unfortunately. He has had new complaints of chest discomfort and dyspnea on exertion. I will repeat nuclear medicine stress test for evaluation of new areas of  ischemia, an echocardiogram to evaluate for changes in LV systolic function. Continue secondary prevention with statin therapy, aspirin, Plavix, and beta blocker.  2. Hypertension: Elevated today. He had been taken off of lisinopril and decrease dose of metoprolol. Will restart lisinopril at 2.5 mg daily, which is lower dose from 10 mg daily which she was on prior to hospitalization for GI virus. Will evaluate patient's response to medication on follow-up appointment. BMET is ordered which he will have through his employer, Rx is written.  3. Ongoing tobacco abuse: Unfortunately despite numerous attempts at education and reinforcement of need to quit smoking, he continues to do so. I have again explained to him with known history of heart disease that this is mandatory for him to stop, to prevent progression of known disease. He verbalizes understanding but does not appear ready to completely quit. He is using Chantix but is not stopped smoking yet.   Current medicines are  reviewed at length with the patient today.    Labs/ tests ordered today include:   Orders Placed This Encounter  Procedures  . NM Myocar Multi W/Spect W/Wall Motion / EF  . ECHOCARDIOGRAM COMPLETE     Disposition:   FU with 2-3 weeks.  Signed, Jory Sims, NP  10/14/2016 6:32 PM    [facility] [address] [place], [place] [address] Phone: [phone]; Fax: [phone]

## 2016-10-14 ENCOUNTER — Encounter: Payer: Self-pay | Admitting: *Deleted

## 2016-10-14 ENCOUNTER — Ambulatory Visit (INDEPENDENT_AMBULATORY_CARE_PROVIDER_SITE_OTHER): Payer: Commercial Managed Care - PPO | Admitting: Adult Health

## 2016-10-14 ENCOUNTER — Encounter: Payer: Self-pay | Admitting: Adult Health

## 2016-10-14 VITALS — BP 140/84 | HR 64 | Wt 161.0 lb

## 2016-10-14 DIAGNOSIS — I2510 Atherosclerotic heart disease of native coronary artery without angina pectoris: Secondary | ICD-10-CM

## 2016-10-14 DIAGNOSIS — Z720 Tobacco use: Secondary | ICD-10-CM

## 2016-10-14 DIAGNOSIS — I10 Essential (primary) hypertension: Secondary | ICD-10-CM

## 2016-10-14 DIAGNOSIS — R072 Precordial pain: Secondary | ICD-10-CM | POA: Diagnosis not present

## 2016-10-14 NOTE — Patient Instructions (Signed)
Your physician recommends that you schedule a follow-up appointment in: After Test  Your physician recommends that you continue on your current medications as directed. Please refer to the Current Medication list given to you today.  Your physician has requested that you have an echocardiogram. Echocardiography is a painless test that uses sound waves to create images of your heart. It provides your doctor with information about the size and shape of your heart and how well your heart's chambers and valves are working. This procedure takes approximately one hour. There are no restrictions for this procedure.  Your physician has requested that you have en exercise stress myoview. For further information please visit [email or web address]. Please follow instruction sheet, as given.  If you need a refill on your cardiac medications before your next appointment, please call your pharmacy.  Thank you for choosing [facility]!

## 2016-10-14 NOTE — Progress Notes (Signed)
Name: Anthony Hensley    DOB: 04/01/1961  Age: 56 y.o.  MR#: [id number]       PCP:  [name]      Insurance: Payor: [employer] / Plan: U[employer] PPO / Product Type: *No Product type* /   CC:   No chief complaint on file.   VS Vitals:   10/14/16 1627  BP: 140/84  Pulse: 64  SpO2: 99%  Weight: 161 lb (73 kg)    Weights Current Weight  10/14/16 161 lb (73 kg)  09/27/16 161 lb 1.6 oz (73.1 kg)  06/08/16 163 lb 12.8 oz (74.3 kg)    Blood Pressure  BP Readings from Last 3 Encounters:  10/14/16 140/84  09/27/16 118/76  06/08/16 120/73     Admit date:  (Not on file) Last encounter with RMR:  07/13/2016   Allergy Patient has no known allergies.  Current Outpatient Prescriptions  Medication Sig Dispense Refill  . albuterol (PROVENTIL) (2.5 MG/3ML) 0.083% nebulizer solution Take 3 mLs by nebulization daily as needed for shortness of breath.  3  . aspirin 81 MG EC tablet Take 1 tablet (81 mg total) by mouth daily. 30 tablet   . atorvastatin (LIPITOR) 80 MG tablet TAKE 1 TABLET BY MOUTH EVERY DAY AT 6 p.m. 30 tablet 3  . clopidogrel (PLAVIX) 75 MG tablet TAKE 1 TABLET BY MOUTH EVERY DAY WITH BREAKFAST 30 tablet 11  . Glycerin-Hypromellose-PEG 400 (VISINE TEARS OP) Apply 1 drop to eye daily as needed (dry eyes).     . metoprolol tartrate (LOPRESSOR) 25 MG tablet Take 0.5 tablets (12.5 mg total) by mouth 2 (two) times daily. 90 tablet 3  . nicotine (NICODERM CQ - DOSED IN MG/24 HOURS) 14 mg/24hr patch Place 1 patch (14 mg total) onto the skin daily. 28 patch 0  . NITROSTAT 0.4 MG SL tablet Place 1 tablet under the tongue daily as needed for chest pain.  1  . PROAIR HFA 108 (90 Base) MCG/ACT inhaler Inhale 2 puffs into the lungs every 4 (four) hours as needed for shortness of breath.  3  . RABEprazole (ACIPHEX) 20 MG tablet Take 1 tablet (20 mg total) by mouth daily. 30 tablet 11  . triamcinolone lotion (KENALOG) 0.1 % Apply 1 application topically 2 (two) times daily as  needed (rash).     . varenicline (CHANTIX PAK) 0.5 MG X 11 & 1 MG X 42 tablet Take 1 mg by mouth 2 (two) times daily.      No current facility-administered medications for this visit.     Discontinued Meds:   There are no discontinued medications.  Patient Active Problem List   Diagnosis Date Noted  . Diarrhea 09/26/2016  . Enteritis 09/26/2016  . GERD (gastroesophageal reflux disease) 06/08/2016  . Constipation 06/08/2016  . Mucosal abnormality of stomach   . Mucosal abnormality of esophagus   . History of colonic polyps   . Diverticulosis of colon without hemorrhage   . Dysphagia 11/27/2015  . Rectal bleeding 11/27/2015  . Abdominal pain 11/27/2015  . Tobacco abuse 07/27/2015  . Chest pain at rest   . NSTEMI (non-ST elevated myocardial infarction) ([place])   . ACS (acute coronary syndrome) ([place]) 07/12/2015  . Hyperlipidemia 07/12/2015  . Chest pain 07/11/2015  . Essential hypertension 07/11/2015  . LUMBAR SPRAIN AND STRAIN 06/08/2009    LABS    Component Value Date/Time   NA 139 09/27/2016 0749   NA 137 09/26/2016 0139   NA 139 02/25/2016 1529  K 3.9 09/27/2016 0749   K 3.9 09/26/2016 0139   K 4.6 02/25/2016 1529   CL 107 09/27/2016 0749   CL 104 09/26/2016 0139   CL 104 02/25/2016 1529   CO2 26 09/27/2016 0749   CO2 27 09/26/2016 0139   CO2 23 02/25/2016 1529   GLUCOSE 73 09/27/2016 0749   GLUCOSE 131 (H) 09/26/2016 0139   GLUCOSE 86 02/25/2016 1529   BUN 14 09/27/2016 0749   BUN 19 09/26/2016 0139   BUN 23 02/25/2016 1529   CREATININE 0.89 09/27/2016 0749   CREATININE 0.87 09/26/2016 0139   CREATININE 0.89 02/25/2016 1529   CREATININE 0.92 11/09/2015 2121   CALCIUM 8.6 (L) 09/27/2016 0749   CALCIUM 9.6 09/26/2016 0139   CALCIUM 9.0 02/25/2016 1529   GFRNONAA >60 09/27/2016 0749   GFRNONAA >60 09/26/2016 0139   GFRNONAA >60 11/09/2015 2121   GFRAA >60 09/27/2016 0749   GFRAA >60 09/26/2016 0139   GFRAA >60 11/09/2015 2121   CMP     Component Value  Date/Time   NA 139 09/27/2016 0749   K 3.9 09/27/2016 0749   CL 107 09/27/2016 0749   CO2 26 09/27/2016 0749   GLUCOSE 73 09/27/2016 0749   BUN 14 09/27/2016 0749   CREATININE 0.89 09/27/2016 0749   CREATININE 0.89 02/25/2016 1529   CALCIUM 8.6 (L) 09/27/2016 0749   PROT 7.8 09/26/2016 0139   ALBUMIN 4.4 09/26/2016 0139   AST 22 09/26/2016 0139   ALT 30 09/26/2016 0139   ALKPHOS 94 09/26/2016 0139   BILITOT 1.0 09/26/2016 0139   GFRNONAA >60 09/27/2016 0749   GFRAA >60 09/27/2016 0749       Component Value Date/Time   WBC 4.6 09/27/2016 0749   WBC 9.1 09/26/2016 0139   WBC 7.1 02/25/2016 1529   HGB 14.4 09/27/2016 0749   HGB 17.2 (H) 09/26/2016 0139   HGB 15.1 02/25/2016 1529   HCT 44.4 09/27/2016 0749   HCT 50.5 09/26/2016 0139   HCT 43.4 02/25/2016 1529   MCV 93.9 09/27/2016 0749   MCV 92.8 09/26/2016 0139   MCV 88.8 02/25/2016 1529    Lipid Panel     Component Value Date/Time   CHOL 209 (H) 07/12/2015 0615   TRIG 126 07/12/2015 0615   HDL 34 (L) 07/12/2015 0615   CHOLHDL 6.1 07/12/2015 0615   VLDL 25 07/12/2015 0615   LDLCALC 150 (H) 07/12/2015 0615    ABG No results found for: PHART, PCO2ART, PO2ART, HCO3, TCO2, ACIDBASEDEF, O2SAT   No results found for: TSH BNP (last 3 results) No results for input(s): BNP in the last 8760 hours.  ProBNP (last 3 results) No results for input(s): PROBNP in the last 8760 hours.  Cardiac Panel (last 3 results) No results for input(s): CKTOTAL, CKMB, TROPONINI, RELINDX in the last 72 hours.  Iron/TIBC/Ferritin/ %Sat No results found for: IRON, TIBC, FERRITIN, IRONPCTSAT   EKG Orders placed or performed during the hospital encounter of 11/09/15  . ED EKG  . ED EKG  . EKG 12-Lead  . EKG 12-Lead  . EKG     Prior Assessment and Plan Problem List as of 10/14/2016 Reviewed: 09/26/2016  1:07 PM by Elmarie Shiley, MD     Cardiovascular and Mediastinum   Essential hypertension   ACS (acute coronary syndrome) [facility])    NSTEMI (non-ST elevated myocardial infarction) [facility])     Digestive   Dysphagia   Last Assessment & Plan 11/27/2015 Office Visit Written 11/27/2015 11:04 AM by  Orvil Feil, NP    Solid food dysphagia since Oct 2016, better with Protonix daily. However, dysphagia still persists. Likely related to uncontrolled GERD, web, ring, stricture. Will need EGD with dilation at time of colonoscopy.   Proceed with upper endoscopy and dilation in the near future with Dr. Gala Romney. The risks, benefits, and alternatives have been discussed in detail with patient. They have stated understanding and desire to proceed.  Continue Protonix once daily.  Carafate short-course      Rectal bleeding   Last Assessment & Plan 06/08/2016 Office Visit Written 06/08/2016  3:51 PM by [name], NP    Occasional/rare intermittent rectal bleeding from hemorrhoids. Occasional hemorrhoid itching as well. Offered for him to call us when he does have flareup symptoms and we can send an Anusol rectal cream to his pharmacy. Continue to monitor. Recent colonoscopy reassuring.      Mucosal abnormality of stomach   Mucosal abnormality of esophagus   Diverticulosis of colon without hemorrhage   GERD (gastroesophageal reflux disease)   Last Assessment & Plan 06/08/2016 Office Visit Written 06/08/2016  3:51 PM by [name], NP    Patient with a history of GERD. Today he states his symptoms are significantly improved on AcipHex. 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 He is taking AcipHex in the morning along with Plavix. Recommend he space these out to take Plavix in the morning and AcipHex in the afternoon. Does have some residual symptoms and I will send in Carafate that he can take as needed for breakthrough symptoms. Return for follow-up in 6 months.      Constipation   Last Assessment & Plan 06/08/2016 Office Visit Written 06/08/2016  3:52 PM by [name], NP    Constipation essentially resolved with Colace  100 mg twice a day. Continue to monitor, return for follow-up in 6 months or as needed.      Enteritis     Musculoskeletal and Integument   LUMBAR SPRAIN AND STRAIN     Other   Chest pain   Hyperlipidemia   Chest pain at rest   Tobacco abuse   Abdominal pain   Last Assessment & Plan 11/27/2015 Office Visit Edited 11/27/2015 11:04 AM by Orvil Feil, NP    56 year old male with acute onset of pain and diarrhea in Jan 2017, presenting to the ED with unremarkable labs and CT. Diarrhea has improved since this time, and he has days of no bowel movements. May have had self-limiting infectious process now with post-infectious symptoms. Low-volume hematochezia may be benign in setting of recent diarrhea illness but unable to exclude occult etiology. As diarrhea has improved and he has days without bowel movements, will hold on stool studies. If any recurrence, will obtain stool studies ASAP. As he has never had a colonoscopy, will proceed with this in the near future for further evaluation. As of note, takes Ibuprofen prn, which he has been asked to hold for now.   Proceed with TCS with Dr. Gala Romney in near future: the risks, benefits, and alternatives have been discussed with the patient in detail. The patient states understanding and desires to proceed. Levsin prn abdominal cramping As of note, recent cardiac stent place Oct 2016. HE IS ON PLAVIX. Discussed with cardiology, Jory Sims, NP, who stated no contraindication for procedure.       History of colonic polyps   Diarrhea       Imaging: Dg Abd 1 View  Result Date: 09/27/2016 CLINICAL DATA:  Right-sided abdominal pain EXAM: ABDOMEN - 1 VIEW COMPARISON:  None. FINDINGS: The bowel gas pattern is normal. No radio-opaque calculi or other significant radiographic abnormality are seen. IMPRESSION: No bowel obstruction. Electronically Signed   By: Kathreen Devoid   On: 09/27/2016 11:21   Ct Abdomen Pelvis W Contrast  Result Date:  09/26/2016 CLINICAL DATA:  Abdominal pain and diarrhea EXAM: CT ABDOMEN AND PELVIS WITH CONTRAST TECHNIQUE: Multidetector CT imaging of the abdomen and pelvis was performed using the standard protocol following bolus administration of intravenous contrast. Oral contrast was also administered. CONTRAST:  142mL ISOVUE-300 IOPAMIDOL (ISOVUE-300) INJECTION 61% COMPARISON:  November 09, 2015 FINDINGS: Lower chest: Lung bases are clear. Hepatobiliary: No focal liver lesions are evident. Gallbladder wall is not appreciably thickened. There is no biliary duct dilatation. Pancreas: There is no pancreatic mass or inflammatory focus. Spleen: Splenic lesions are evident. Adrenals/Urinary Tract: Adrenals appear normal bilaterally. There is a 9 x 7 mm cyst in the mid left kidney. There is no hydronephrosis on either side. There is no renal or ureteral calculus on either side. Urinary bladder is midline with wall thickness within normal limits. Stomach/Bowel: There is small bowel dilatation with fluid throughout much small bowel. There is a relative transition zone near the jejunoileal junction, concerning for a degree of bowel obstruction. No free air or portal venous air is evident. The mesentery is not appreciably thickened. There is fluid throughout much of the colon. Vascular/Lymphatic: There is atherosclerotic calcification in the aorta and right common iliac artery. There is no abdominal aortic aneurysm. Major mesenteric vessels appear patent. There is no appreciable adenopathy in the abdomen or pelvis. Reproductive: Prostate and seminal vesicles appear normal in size and contour. There is no pelvic mass or pelvic fluid collection. Other: Appendix appears normal. There is no ascites or abscess in the abdomen or pelvis. There is a minimal ventral hernia containing only fat. Musculoskeletal: There is a stable exostosis arising from the lateral upper left iliac crest, stable. There are no blastic or lytic bone lesions. There  is no intramuscular or abdominal wall lesion. IMPRESSION: Multiple loops above fluid-filled small and large bowel, likely indicative of a degree of enterocolitis. There is a degree of small bowel dilatation with a transition zone near the jejunoileal junction, concerning for early bowel obstruction. No free air. No abscess.  Appendix appears normal. No renal or ureteral calculus.  No hydronephrosis. There is aortoiliac atherosclerosis. Rather minimal ventral hernia present containing only fat. Electronically Signed   By: Lowella Grip III M.D.   On: 09/26/2016 08:03

## 2016-10-21 ENCOUNTER — Encounter (HOSPITAL_COMMUNITY): Payer: Self-pay

## 2016-10-21 ENCOUNTER — Ambulatory Visit (HOSPITAL_COMMUNITY)
Admission: RE | Admit: 2016-10-21 | Discharge: 2016-10-21 | Disposition: A | Discharge status: Home / Self Care | Payer: Commercial Managed Care - PPO | Source: Ambulatory Visit | Attending: Adult Health | Admitting: Adult Health

## 2016-10-21 ENCOUNTER — Inpatient Hospital Stay (HOSPITAL_COMMUNITY): Admission: RE | Admit: 2016-10-21 | Payer: Commercial Managed Care - PPO | Source: Ambulatory Visit

## 2016-10-21 ENCOUNTER — Encounter (HOSPITAL_COMMUNITY)
Admission: RE | Admit: 2016-10-21 | Discharge: 2016-10-21 | Disposition: A | Discharge status: Home / Self Care | Payer: Commercial Managed Care - PPO | Source: Ambulatory Visit | Attending: Adult Health | Admitting: Adult Health

## 2016-10-21 DIAGNOSIS — I5189 Other ill-defined heart diseases: Secondary | ICD-10-CM | POA: Diagnosis not present

## 2016-10-21 DIAGNOSIS — R072 Precordial pain: Secondary | ICD-10-CM | POA: Insufficient documentation

## 2016-10-21 DIAGNOSIS — Q218 Other congenital malformations of cardiac septa: Secondary | ICD-10-CM | POA: Insufficient documentation

## 2016-10-21 DIAGNOSIS — I351 Nonrheumatic aortic (valve) insufficiency: Secondary | ICD-10-CM | POA: Insufficient documentation

## 2016-10-21 LAB — ECHOCARDIOGRAM COMPLETE
AVLVOTPG: 5 mmHg
CHL CUP DOP CALC LVOT VTI: 22.6 cm
E decel time: 278 msec
E/e' ratio: 6.58
FS: 38 % (ref 28–44)
IV/PV OW: 1.1
LA ID, A-P, ES: 32 mm
LA diam end sys: 32 mm
LA vol A4C: 34.4 ml
LA vol index: 19.5 mL/m2
LA vol: 36.2 mL
LADIAMINDEX: 1.72 cm/m2
LV E/e' medial: 6.58
LV E/e'average: 6.58
LV PW d: 9.83 mm — AB (ref 0.6–1.1)
LV SIMPSON'S DISK: 66
LV TDI E'LATERAL: 9.57
LV TDI E'MEDIAL: 7.94
LV dias vol index: 46 mL/m2
LV dias vol: 86 mL (ref 62–150)
LV e' LATERAL: 9.57 cm/s
LVOT SV: 78 mL
LVOT area: 3.46 cm2
LVOT diameter: 21 mm
LVOTPV: 107 cm/s
LVSYSVOL: 29 mL
LVSYSVOLIN: 16 mL/m2
Lateral S' vel: 13.1 cm/s
MV Dec: 278
MV pk E vel: 63 m/s
MVPKAVEL: 72.5 m/s
P 1/2 time: 718 ms
Stroke v: 57 ml
TAPSE: 22.8 mm

## 2016-10-21 LAB — NM MYOCAR MULTI W/SPECT W/WALL MOTION / EF
CHL CUP MPHR: 167 {beats}/min
CHL CUP NUCLEAR SSS: 6
CHL CUP RESTING HR STRESS: 56 {beats}/min
CHL RATE OF PERCEIVED EXERTION: 17
CSEPEDS: 47 s
CSEPHR: 65 %
Estimated workload: 7 METS
Exercise duration (min): 4 min
LHR: 0.25
LV sys vol: 42 mL
LVDIAVOL: 105 mL (ref 62–150)
Peak HR: 109 {beats}/min
SDS: 3
SRS: 3
TID: 1.03

## 2016-10-21 MED ORDER — SODIUM CHLORIDE 0.9% FLUSH
INTRAVENOUS | Status: AC
  Administered 2016-10-21: 10 mL via INTRAVENOUS
  Filled 2016-10-21: qty 10

## 2016-10-21 MED ORDER — REGADENOSON 0.4 MG/5ML IV SOLN
INTRAVENOUS | Status: AC
  Administered 2016-10-21: 0.4 mg via INTRAVENOUS
  Filled 2016-10-21: qty 5

## 2016-10-21 MED ORDER — TECHNETIUM TC 99M TETROFOSMIN IV KIT
10.0000 | PACK | Freq: Once | INTRAVENOUS | Status: AC | PRN
  Administered 2016-10-21: 10.3 via INTRAVENOUS

## 2016-10-21 MED ORDER — TECHNETIUM TC 99M TETROFOSMIN IV KIT
30.0000 | PACK | Freq: Once | INTRAVENOUS | Status: AC | PRN
Start: 2016-10-21 — End: 2016-10-21
  Administered 2016-10-21: 31 via INTRAVENOUS

## 2016-10-21 NOTE — Progress Notes (Signed)
*  PRELIMINARY RESULTS* Echocardiogram 2D Echocardiogram has been performed.  Anthony Hensley 10/21/2016, 9:48 AM

## 2016-10-24 ENCOUNTER — Other Ambulatory Visit: Payer: Self-pay | Admitting: *Deleted

## 2016-10-24 ENCOUNTER — Telehealth: Payer: Self-pay | Admitting: *Deleted

## 2016-10-24 MED ORDER — LISINOPRIL 2.5 MG PO TABS: 2.5000 mg | ORAL_TABLET | Freq: Every day | ORAL | 3 refills | Status: DC

## 2016-10-24 NOTE — Telephone Encounter (Signed)
-----   Message from [name], NP sent at 10/21/2016 11:39 AM EST ----- Echo was essentially normal with the exception of Grade I diastolic dysfunction. He will need to keep BP controlled. Still waiting results of stress test.

## 2016-10-24 NOTE — Telephone Encounter (Signed)
Called patient with test results. No answer. Left message to call back.  

## 2016-10-28 ENCOUNTER — Ambulatory Visit (INDEPENDENT_AMBULATORY_CARE_PROVIDER_SITE_OTHER): Payer: Commercial Managed Care - PPO | Admitting: Adult Health

## 2016-10-28 ENCOUNTER — Encounter: Payer: Self-pay | Admitting: Adult Health

## 2016-10-28 VITALS — BP 136/76 | HR 79 | Ht 66.0 in | Wt 165.0 lb

## 2016-10-28 DIAGNOSIS — I2510 Atherosclerotic heart disease of native coronary artery without angina pectoris: Secondary | ICD-10-CM

## 2016-10-28 DIAGNOSIS — Z720 Tobacco use: Secondary | ICD-10-CM

## 2016-10-28 DIAGNOSIS — R079 Chest pain, unspecified: Secondary | ICD-10-CM

## 2016-10-28 NOTE — Patient Instructions (Signed)
Your physician wants you to follow-up in: 6 months K Lawrence NP You will receive a reminder letter in the mail two months in advance. If you don't receive a letter, please call our office to schedule the follow-up appointment.     Your physician recommends that you continue on your current medications as directed. Please refer to the Current Medication list given to you today.   Thank you for choosing [facility] !        

## 2016-10-28 NOTE — Progress Notes (Signed)
Name: Anthony Hensley    DOB: [birth year]  Age: 56 y.o.  MR#: [id number]       PCP:  [name]      Insurance: Payor: [employer] / Plan: U[employer] PPO / Product Type: *No Product type* /   CC:   No chief complaint on file.   VS Vitals:   10/28/16 1523  BP: 136/76  Pulse: 79  SpO2: 98%  Weight: 165 lb (74.8 kg)  Height: 5\' 6"  (1.676 m)    Weights Current Weight  10/28/16 165 lb (74.8 kg)  10/14/16 161 lb (73 kg)  09/27/16 161 lb 1.6 oz (73.1 kg)    Blood Pressure  BP Readings from Last 3 Encounters:  10/28/16 136/76  10/14/16 140/84  09/27/16 118/76     Admit date:  (Not on file) Last encounter with [facility]MR:  10/14/2016   Allergy Patient has no known allergies.  Current Outpatient Prescriptions  Medication Sig Dispense Refill  . albuterol (PROVENTIL) (2.5 MG/3ML) 0.083% nebulizer solution Take 3 mLs by nebulization daily as needed for shortness of breath.  3  . aspirin 81 MG EC tablet Take 1 tablet (81 mg total) by mouth daily. 30 tablet   . atorvastatin (LIPITOR) 80 MG tablet TAKE 1 TABLET BY MOUTH EVERY DAY AT 6 p.m. 30 tablet 3  . clopidogrel (PLAVIX) 75 MG tablet TAKE 1 TABLET BY MOUTH EVERY DAY WITH BREAKFAST 30 tablet 11  . Glycerin-Hypromellose-PEG 400 (VISINE TEARS OP) Apply 1 drop to eye daily as needed (dry eyes).     Marland Kitchen lisinopril (PRINIVIL,ZESTRIL) 2.5 MG tablet Take 1 tablet (2.5 mg total) by mouth daily. 30 tablet 3  . metoprolol tartrate (LOPRESSOR) 25 MG tablet Take 0.5 tablets (12.5 mg total) by mouth 2 (two) times daily. 90 tablet 3  . nicotine (NICODERM CQ - DOSED IN MG/24 HOURS) 14 mg/24hr patch Place 1 patch (14 mg total) onto the skin daily. 28 patch 0  . NITROSTAT 0.4 MG SL tablet Place 1 tablet under the tongue daily as needed for chest pain.  1  . PROAIR HFA 108 (90 Base) MCG/ACT inhaler Inhale 2 puffs into the lungs every 4 (four) hours as needed for shortness of breath.  3  . RABEprazole (ACIPHEX) 20 MG tablet Take 1 tablet (20 mg total)  by mouth daily. 30 tablet 11  . triamcinolone lotion (KENALOG) 0.1 % Apply 1 application topically 2 (two) times daily as needed (rash).     . varenicline (CHANTIX PAK) 0.5 MG X 11 & 1 MG X 42 tablet Take 1 mg by mouth 2 (two) times daily.      No current facility-administered medications for this visit.     Discontinued Meds:   There are no discontinued medications.  Patient Active Problem List   Diagnosis Date Noted  . Diarrhea 09/26/2016  . Enteritis 09/26/2016  . GERD (gastroesophageal reflux disease) 06/08/2016  . Constipation 06/08/2016  . Mucosal abnormality of stomach   . Mucosal abnormality of esophagus   . History of colonic polyps   . Diverticulosis of colon without hemorrhage   . Dysphagia 11/27/2015  . Rectal bleeding 11/27/2015  . Abdominal pain 11/27/2015  . Tobacco abuse 07/27/2015  . Chest pain at rest   . NSTEMI (non-ST elevated myocardial infarction) ([place])   . ACS (acute coronary syndrome) ([place]) 07/12/2015  . Hyperlipidemia 07/12/2015  . Chest pain 07/11/2015  . Essential hypertension 07/11/2015  . LUMBAR SPRAIN AND STRAIN 06/08/2009    LABS  Component Value Date/Time   NA 139 09/27/2016 0749   NA 137 09/26/2016 0139   NA 139 02/25/2016 1529   K 3.9 09/27/2016 0749   K 3.9 09/26/2016 0139   K 4.6 02/25/2016 1529   CL 107 09/27/2016 0749   CL 104 09/26/2016 0139   CL 104 02/25/2016 1529   CO2 26 09/27/2016 0749   CO2 27 09/26/2016 0139   CO2 23 02/25/2016 1529   GLUCOSE 73 09/27/2016 0749   GLUCOSE 131 (H) 09/26/2016 0139   GLUCOSE 86 02/25/2016 1529   BUN 14 09/27/2016 0749   BUN 19 09/26/2016 0139   BUN 23 02/25/2016 1529   CREATININE 0.89 09/27/2016 0749   CREATININE 0.87 09/26/2016 0139   CREATININE 0.89 02/25/2016 1529   CREATININE 0.92 11/09/2015 2121   CALCIUM 8.6 (L) 09/27/2016 0749   CALCIUM 9.6 09/26/2016 0139   CALCIUM 9.0 02/25/2016 1529   GFRNONAA >60 09/27/2016 0749   GFRNONAA >60 09/26/2016 0139   GFRNONAA >60  11/09/2015 2121   GFRAA >60 09/27/2016 0749   GFRAA >60 09/26/2016 0139   GFRAA >60 11/09/2015 2121   CMP     Component Value Date/Time   NA 139 09/27/2016 0749   K 3.9 09/27/2016 0749   CL 107 09/27/2016 0749   CO2 26 09/27/2016 0749   GLUCOSE 73 09/27/2016 0749   BUN 14 09/27/2016 0749   CREATININE 0.89 09/27/2016 0749   CREATININE 0.89 02/25/2016 1529   CALCIUM 8.6 (L) 09/27/2016 0749   PROT 7.8 09/26/2016 0139   ALBUMIN 4.4 09/26/2016 0139   AST 22 09/26/2016 0139   ALT 30 09/26/2016 0139   ALKPHOS 94 09/26/2016 0139   BILITOT 1.0 09/26/2016 0139   GFRNONAA >60 09/27/2016 0749   GFRAA >60 09/27/2016 0749       Component Value Date/Time   WBC 4.6 09/27/2016 0749   WBC 9.1 09/26/2016 0139   WBC 7.1 02/25/2016 1529   HGB 14.4 09/27/2016 0749   HGB 17.2 (H) 09/26/2016 0139   HGB 15.1 02/25/2016 1529   HCT 44.4 09/27/2016 0749   HCT 50.5 09/26/2016 0139   HCT 43.4 02/25/2016 1529   MCV 93.9 09/27/2016 0749   MCV 92.8 09/26/2016 0139   MCV 88.8 02/25/2016 1529    Lipid Panel     Component Value Date/Time   CHOL 209 (H) 07/12/2015 0615   TRIG 126 07/12/2015 0615   HDL 34 (L) 07/12/2015 0615   CHOLHDL 6.1 07/12/2015 0615   VLDL 25 07/12/2015 0615   LDLCALC 150 (H) 07/12/2015 0615    ABG No results found for: PHART, PCO2ART, PO2ART, HCO3, TCO2, ACIDBASEDEF, O2SAT   No results found for: TSH BNP (last 3 results) No results for input(s): BNP in the last 8760 hours.  ProBNP (last 3 results) No results for input(s): PROBNP in the last 8760 hours.  Cardiac Panel (last 3 results) No results for input(s): CKTOTAL, CKMB, TROPONINI, RELINDX in the last 72 hours.  Iron/TIBC/Ferritin/ %Sat No results found for: IRON, TIBC, FERRITIN, IRONPCTSAT   EKG Orders placed or performed in visit on 10/28/16  . EKG 12-Lead     Prior Assessment and Plan Problem List as of 10/28/2016 Reviewed: 10/14/2016  6:37 PM by Jory Sims, NP     Cardiovascular and Mediastinum    Essential hypertension   ACS (acute coronary syndrome) [facility])   NSTEMI (non-ST elevated myocardial infarction) [facility])     Digestive   Dysphagia   Last Assessment & Plan 11/27/2015 Office Visit  Written 11/27/2015 11:04 AM by Orvil Feil, NP    Solid food dysphagia since Oct 2016, better with Protonix daily. However, dysphagia still persists. Likely related to uncontrolled GERD, web, ring, stricture. Will need EGD with dilation at time of colonoscopy.   Proceed with upper endoscopy and dilation in the near future with Dr. Gala Romney. The risks, benefits, and alternatives have been discussed in detail with patient. They have stated understanding and desire to proceed.  Continue Protonix once daily.  Carafate short-course      Rectal bleeding   Last Assessment & Plan 06/08/2016 Office Visit Written 06/08/2016  3:51 PM by [name], NP    Occasional/rare intermittent rectal bleeding from hemorrhoids. Occasional hemorrhoid itching as well. Offered for him to call us when he does have flareup symptoms and we can send an Anusol rectal cream to his pharmacy. Continue to monitor. Recent colonoscopy reassuring.      Mucosal abnormality of stomach   Mucosal abnormality of esophagus   Diverticulosis of colon without hemorrhage   GERD (gastroesophageal reflux disease)   Last Assessment & Plan 06/08/2016 Office Visit Written 06/08/2016  3:51 PM by [name], NP    Patient with a history of GERD. Today he states his symptoms are significantly improved on AcipHex. 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 He is taking AcipHex in the morning along with Plavix. Recommend he space these out to take Plavix in the morning and AcipHex in the afternoon. Does have some residual symptoms and I will send in Carafate that he can take as needed for breakthrough symptoms. Return for follow-up in 6 months.      Constipation   Last Assessment & Plan 06/08/2016 Office Visit Written 06/08/2016  3:52 PM by [name], NP    Constipation essentially resolved with Colace 100 mg twice a day. Continue to monitor, return for follow-up in 6 months or as needed.      Enteritis     Musculoskeletal and Integument   LUMBAR SPRAIN AND STRAIN     Other   Chest pain   Hyperlipidemia   Chest pain at rest   Tobacco abuse   Abdominal pain   Last Assessment & Plan 11/27/2015 Office Visit Edited 11/27/2015 11:04 AM by Orvil Feil, NP    56 year old male with acute onset of pain and diarrhea in Jan 2017, presenting to the ED with unremarkable labs and CT. Diarrhea has improved since this time, and he has days of no bowel movements. May have had self-limiting infectious process now with post-infectious symptoms. Low-volume hematochezia may be benign in setting of recent diarrhea illness but unable to exclude occult etiology. As diarrhea has improved and he has days without bowel movements, will hold on stool studies. If any recurrence, will obtain stool studies ASAP. As he has never had a colonoscopy, will proceed with this in the near future for further evaluation. As of note, takes Ibuprofen prn, which he has been asked to hold for now.   Proceed with TCS with Dr. Gala Romney in near future: the risks, benefits, and alternatives have been discussed with the patient in detail. The patient states understanding and desires to proceed. Levsin prn abdominal cramping As of note, recent cardiac stent place Oct 2016. HE IS ON PLAVIX. Discussed with cardiology, Jory Sims, NP, who stated no contraindication for procedure.       History of colonic polyps   Diarrhea       Imaging:  Nm Myocar Multi W/spect W/wall Motion / Ef  Result Date: 10/21/2016  Blood pressure demonstrated a normal response to exercise.  There was no ST segment deviation noted during stress.  Defect 1: There is a medium defect of moderate severity present in the basal inferoseptal, basal inferior, mid inferoseptal, mid inferior and apical inferior  location.  Findings consistent with probable prior myocardial infarction with peri-infarct ischemia.  This is a low to intermediate risk study.  Nuclear stress EF: 60%.

## 2016-10-28 NOTE — Progress Notes (Signed)
Cardiology Office Note   Date:  10/28/2016   ID:  JACAYDEN STVINCENT, DOB [birth year], MRN [id number]  PCP:  [name]  Cardiologist: Woodroe Chen, NP   No chief complaint on file.     History of Present Illness: Anthony Hensley is a 56 y.o. male who presents for ongoing assessment and management of coronary artery disease, stent in 2016 to the mid circumflex again during a 99% stenosis, drug-eluting stent into the obtuse marginal branch. Distal circumflex disease was noted on most recent cardiac catheterization  hypertension, who was seen in the office on 10/14/2016 with complaints of chronic chest pain, dyspnea and fatigue. Unfortunately the patient continued to smoke. The patient was hypotensive and had been taken off the lisinopril and metoprolol. He is also being followed by GI for dysphasia.  A repeat nuclear medicine stress test was ordered due to his recurrent symptoms. He was found to have hypertension in the office visit, therefore I restarted lisinopril 2.5 mg daily (lower than original 10 mg daily dose). He was counseled on smoking cessation.  Stress MPI 10/21/2016  Blood pressure demonstrated a normal response to exercise.  There was no ST segment deviation noted during stress.  Defect 1: There is a medium defect of moderate severity present in the basal inferoseptal, basal inferior, mid inferoseptal, mid inferior and apical inferior location.  Findings consistent with probable prior myocardial infarction with peri-infarct ischemia.  This is a low to intermediate risk study.  Nuclear stress EF: 60%.  Echocardiogram 10/21/2016 Left ventricle: The cavity size was normal. Wall thickness was   normal. Systolic function was normal. The estimated ejection   fraction was in the range of 60% to 65%. Wall motion was normal;   there were no regional wall motion abnormalities. Doppler   parameters are consistent with abnormal left ventricular   relaxation  (grade 1 diastolic dysfunction). - Aortic valve: There was mild regurgitation.  He continues to have some chest discomfort. He drinks caffeine all day, Sun Drop and coffee. He also continues to smoke, up to a pack a day.   Past Medical History:  Diagnosis Date  . CAD (coronary artery disease)   . Diverticulosis   . Hypercholesterolemia   . Hypertension     Past Surgical History:  Procedure Laterality Date  . CARDIAC CATHETERIZATION N/A 07/13/2015   Procedure: Left Heart Cath and Coronary Angiography;  Surgeon: Burnell Blanks, MD;  Location: [place] [facility];  Service: Cardiovascular;  Laterality: N/A;  . CARDIAC CATHETERIZATION N/A 07/13/2015   PCI + DES to the mid circ. LVEF was normal at 65%  . COLONOSCOPY N/A 12/31/2015   Dr.Rourk- diverticulosis,5mm polyp in the splenic flexure, 54mm polyp in the splenic flexure, 39mm polyp in the sigmoid colon bx= traditional serrated adenoma  . ESOPHAGOGASTRODUODENOSCOPY N/A 12/31/2015   Dr.Rourk- esophagitis with no bleeding, diffuse moderately erythematous mucosa without bleeding was found in the entire examined stomach. stomach bx= slight chronic inflammation. esophagus bx= benign gastresophageal junction mucosa  . MALONEY DILATION N/A 12/31/2015   Procedure: Venia Minks DILATION;  Surgeon: [name], MD;  Location: [facility];  Service: Endoscopy;  Laterality: N/A;     Current Outpatient Prescriptions  Medication Sig Dispense Refill  . albuterol (PROVENTIL) (2.5 MG/3ML) 0.083% nebulizer solution Take 3 mLs by nebulization daily as needed for shortness of breath.  3  . aspirin 81 MG EC tablet Take 1 tablet (81 mg total) by mouth daily. 30 tablet   . atorvastatin (  LIPITOR) 80 MG tablet TAKE 1 TABLET BY MOUTH EVERY DAY AT 6 p.m. 30 tablet 3  . clopidogrel (PLAVIX) 75 MG tablet TAKE 1 TABLET BY MOUTH EVERY DAY WITH BREAKFAST 30 tablet 11  . Glycerin-Hypromellose-PEG 400 (VISINE TEARS OP) Apply 1 drop to eye daily as needed (dry eyes).      Marland Kitchen lisinopril (PRINIVIL,ZESTRIL) 2.5 MG tablet Take 1 tablet (2.5 mg total) by mouth daily. 30 tablet 3  . metoprolol tartrate (LOPRESSOR) 25 MG tablet Take 0.5 tablets (12.5 mg total) by mouth 2 (two) times daily. 90 tablet 3  . nicotine (NICODERM CQ - DOSED IN MG/24 HOURS) 14 mg/24hr patch Place 1 patch (14 mg total) onto the skin daily. 28 patch 0  . NITROSTAT 0.4 MG SL tablet Place 1 tablet under the tongue daily as needed for chest pain.  1  . PROAIR HFA 108 (90 Base) MCG/ACT inhaler Inhale 2 puffs into the lungs every 4 (four) hours as needed for shortness of breath.  3  . RABEprazole (ACIPHEX) 20 MG tablet Take 1 tablet (20 mg total) by mouth daily. 30 tablet 11  . triamcinolone lotion (KENALOG) 0.1 % Apply 1 application topically 2 (two) times daily as needed (rash).     . varenicline (CHANTIX PAK) 0.5 MG X 11 & 1 MG X 42 tablet Take 1 mg by mouth 2 (two) times daily.      No current facility-administered medications for this visit.     Allergies:   Patient has no known allergies.    Social History:  The patient  reports that he has been smoking Cigarettes.  He has a 26.25 pack-year smoking history. He has never used smokeless tobacco. He reports that he does not drink alcohol or use drugs.   Family History:  The patient's family history includes Heart attack (age of onset: 52) in his father.    ROS: All other systems are reviewed and negative. Unless otherwise mentioned in H&P    PHYSICAL EXAM: VS:  BP 136/76   Pulse 79   Ht 5\' 6"  (1.676 m)   Wt 165 lb (74.8 kg)   SpO2 98%   BMI 26.63 kg/m  , BMI Body mass index is 26.63 kg/m. GEN: Well nourished, well developed, in no acute distress  HEENT: normal  Neck: no JVD, carotid bruits, or masses Cardiac:RRR; no murmurs, rubs, or gallops,no edema  Respiratory:  clear to auscultation bilaterally, normal work of breathing GI: soft, nontender, nondistended, + BS MS: no deformity or atrophy  Skin: warm and dry, no rash Neuro:   Strength and sensation are intact Psych: euthymic mood, full affect   Recent Labs: 02/25/2016: Magnesium 1.9 09/26/2016: ALT 30 09/27/2016: BUN 14; Creatinine, Ser 0.89; Hemoglobin 14.4; Platelets 158; Potassium 3.9; Sodium 139    Lipid Panel    Component Value Date/Time   CHOL 209 (H) 07/12/2015 0615   TRIG 126 07/12/2015 0615   HDL 34 (L) 07/12/2015 0615   CHOLHDL 6.1 07/12/2015 0615   VLDL 25 07/12/2015 0615   LDLCALC 150 (H) 07/12/2015 0615      Wt Readings from Last 3 Encounters:  10/28/16 165 lb (74.8 kg)  10/14/16 161 lb (73 kg)  09/27/16 161 lb 1.6 oz (73.1 kg)      ASSESSMENT AND PLAN:  1.  CAD: Stress test is discussed. Reassurance is given. He is advised to cut back on the caffeine intake. He will continue current regimen of ACE inhibitor, statin, Plavix and ASA. If he has  worsening chest pain, will need to proceed to cardiac cath.   2. Ongoing tobacco abuse: He is again counseled on tobacco cessation. He is down to one pack a day and is taking Chantix. He is encouraged to quit, in order to increase life expectancy and decrease likelihood of progressive CAD.   3. GERD: Decreasing caffeine will be of help. Continue follow ups with GI.   Current medicines are reviewed at length with the patient today.    Labs/ tests ordered today include: Nonw  Orders Placed This Encounter  Procedures  . EKG 12-Lead     Disposition:   FU with 6 months unless symptomatic.   Signed, Jory Sims, NP  10/28/2016 4:02 PM    [facility] [address] [place], [place] [address] Phone: [phone]; Fax: [phone]

## 2016-11-05 ENCOUNTER — Other Ambulatory Visit: Payer: Self-pay | Admitting: Adult Health

## 2016-11-23 ENCOUNTER — Other Ambulatory Visit: Payer: Self-pay

## 2016-11-24 ENCOUNTER — Other Ambulatory Visit: Payer: Self-pay | Admitting: Internal Medicine

## 2016-11-24 MED ORDER — RABEPRAZOLE SODIUM 20 MG PO TBEC: 20.0000 mg | DELAYED_RELEASE_TABLET | Freq: Every day | ORAL | 5 refills | Status: DC

## 2016-12-07 ENCOUNTER — Encounter: Payer: Self-pay | Admitting: Nurse Practitioner

## 2016-12-07 ENCOUNTER — Ambulatory Visit (INDEPENDENT_AMBULATORY_CARE_PROVIDER_SITE_OTHER): Payer: Commercial Managed Care - PPO | Admitting: Nurse Practitioner

## 2016-12-07 VITALS — BP 150/98 | HR 95 | Temp 97.7°F | Ht 66.0 in | Wt 165.2 lb

## 2016-12-07 DIAGNOSIS — R197 Diarrhea, unspecified: Secondary | ICD-10-CM | POA: Diagnosis not present

## 2016-12-07 DIAGNOSIS — R1033 Periumbilical pain: Secondary | ICD-10-CM

## 2016-12-07 NOTE — Patient Instructions (Signed)
1. Have labs drawn when you're able to 2. The next time you have diarrhea, use the sample cups to collect a sample and bring it to the lab (not our office) 3. Start taking a probiotic for 30 days. We have samples to last a couple weeks and a coupon to offset the cost (it is over the counter) 4. Call us if your symptoms worsen 5. If your stool tests are normal, we can start you on a medication for diarrhea and abdominal pain 6. Return for follow-up in 6 weeks.

## 2016-12-07 NOTE — Progress Notes (Signed)
Referring Provider: Jake Samples, PA* Primary Care Physician:  [name] Primary GI:  Dr. Gala Romney  Chief Complaint  Patient presents with  . Constipation  . Diarrhea  . Abdominal Pain    mid abd    HPI:   Anthony Hensley is a 56 y.o. male who presents For follow-up on constipation, diarrhea, and abdominal pain. Patient was last seen in our office 06/08/2016 for rectal bleeding, GERD, constipation. Colonoscopy up-to-date and due for surveillance in 2022. At last visit he stated he was doing fairly well, on AcipHex. Some continued abdominal pain but overall much improved. Abdominal pain mid-upper abdomen, burning, occurs randomly and sometimes postprandially. Taking Colace as recommended with improvement in constipation. Occasional hemorrhoid symptoms. No other GI symptoms. Commended continue Colace, and notify us if hemorrhoid topical therapy needed. Return for follow-up in 6 months.  He was admitted to the hospital from 09/26/2016 through 09/28/2016 for right lower quadrant pain, diarrhea, dehydration, small bowel obstruction. Presented with abdominal pain with diarrhea. CT abdomen and pelvis with multiple loops above fluid filled small and large bowel likely indicative of a degree of enterocolitis, small degree of small bowel dilation with transition zone near the jejunal ileal junction concerning for early bowel obstruction. Also noted minimal ventral hernia present containing only fat. Treated for enteritis and Noro virus with improvement in pain, IV fluids. C. difficile was negative. Diarrhea resolved and tolerating diet, GI pathogen panel positive for Noro virus. He was discharged to home with recommended follow-up with primary care.  Today he states he's doing so-so currently. States he never had something hit him like Norovirus did before. Diarrhea and abdominal pain resolved while hospitalized. Started having recurrence of diarrhea about a month ago, intermittent, occurs  about 5-6 times a week. Otherwise normal stools. Was previously constipated but none in a while. Has mid-abdominal pain, does not improve with bowel movement. Adominal pain is intermittent, described as sharp, varies in intensity. Denies fever, chills. Occasional nausea, no vomiting. Has hematochezia about 5-6 times a week, has hemorrhoids. Also occasionally with hemorrhoid irritation and itching. Denies chest pain, dyspnea, dizziness, lightheadedness, syncope, near syncope. Denies any other upper or lower GI symptoms.  Past Medical History:  Diagnosis Date  . CAD (coronary artery disease)   . Diverticulosis   . Hypercholesterolemia   . Hypertension     Past Surgical History:  Procedure Laterality Date  . CARDIAC CATHETERIZATION N/A 07/13/2015   Procedure: Left Heart Cath and Coronary Angiography;  Surgeon: Burnell Blanks, MD;  Location: [place] [facility];  Service: Cardiovascular;  Laterality: N/A;  . CARDIAC CATHETERIZATION N/A 07/13/2015   PCI + DES to the mid circ. LVEF was normal at 65%  . COLONOSCOPY N/A 12/31/2015   Dr.Rourk- diverticulosis,22mm polyp in the splenic flexure, 36mm polyp in the splenic flexure, 33mm polyp in the sigmoid colon bx= traditional serrated adenoma  . ESOPHAGOGASTRODUODENOSCOPY N/A 12/31/2015   Dr.Rourk- esophagitis with no bleeding, diffuse moderately erythematous mucosa without bleeding was found in the entire examined stomach. stomach bx= slight chronic inflammation. esophagus bx= benign gastresophageal junction mucosa  . MALONEY DILATION N/A 12/31/2015   Procedure: Venia Minks DILATION;  Surgeon: [name], MD;  Location: [facility];  Service: Endoscopy;  Laterality: N/A;    Current Outpatient Prescriptions  Medication Sig Dispense Refill  . albuterol (PROVENTIL) (2.5 MG/3ML) 0.083% nebulizer solution Take 3 mLs by nebulization daily as needed for shortness of breath.  3  . aspirin 81 MG EC tablet  Take 1 tablet (81 mg total) by mouth daily. 30  tablet   . atorvastatin (LIPITOR) 80 MG tablet TAKE 1 TABLET BY MOUTH EVERY DAY AT 6 p.m. 30 tablet 3  . clopidogrel (PLAVIX) 75 MG tablet TAKE 1 TABLET BY MOUTH EVERY DAY WITH BREAKFAST 30 tablet 11  . enalapril (VASOTEC) 10 MG tablet TAKE 1 TABLET BY MOUTH EVERY DAY 30 tablet 3  . Glycerin-Hypromellose-PEG 400 (VISINE TEARS OP) Apply 1 drop to eye daily as needed (dry eyes).     Marland Kitchen lisinopril (PRINIVIL,ZESTRIL) 2.5 MG tablet Take 1 tablet (2.5 mg total) by mouth daily. 30 tablet 3  . metoprolol tartrate (LOPRESSOR) 25 MG tablet Take 0.5 tablets (12.5 mg total) by mouth 2 (two) times daily. 90 tablet 3  . NITROSTAT 0.4 MG SL tablet Place 1 tablet under the tongue daily as needed for chest pain.  1  . PROAIR HFA 108 (90 Base) MCG/ACT inhaler Inhale 2 puffs into the lungs every 4 (four) hours as needed for shortness of breath.  3  . RABEprazole (ACIPHEX) 20 MG tablet Take 1 tablet (20 mg total) by mouth daily. 30 tablet 5  . triamcinolone lotion (KENALOG) 0.1 % Apply 1 application topically 2 (two) times daily as needed (rash).      No current facility-administered medications for this visit.     Allergies as of 12/07/2016  . (No Known Allergies)    Family History  Problem Relation Age of Onset  . Heart attack Father 47    Deceased  . Colon cancer Neg Hx     Social History   Social History  . Marital status: Married    Spouse name: N/A  . Number of children: N/A  . Years of education: N/A   Occupational History  . Machine Teacher, English as a foreign language And [employer]   Social History Main Topics  . Smoking status: Current Some Day Smoker    Packs/day: 0.75    Years: 35.00    Types: Cigarettes  . Smokeless tobacco: Never Used  . Alcohol use No  . Drug use: No  . Sexual activity: Not Asked   Other Topics Concern  . None   Social History Narrative  . None    Review of Systems: General: Negative for anorexia, weight loss, fever, chills, fatigue, weakness. ENT:  Negative for hoarseness, difficulty swallowing. CV: Negative for chest pain, angina, palpitations, peripheral edema.  Respiratory: Negative for dyspnea at rest, cough, sputum, wheezing.  GI: See history of present illness. MS: Negative for joint pain, low back pain.  Derm: Negative for rash or itching.  Endo: Negative for unusual weight change.  Heme: Negative for bruising or bleeding. Allergy: Negative for rash or hives.   Physical Exam: BP (!) 150/98   Pulse 95   Temp 97.7 F (36.5 C) (Oral)   Ht 5\' 6"  (1.676 m)   Wt 165 lb 3.2 oz (74.9 kg)   BMI 26.66 kg/m  General:   Alert and oriented. Pleasant and cooperative. Well-nourished and well-developed.  Head:  Normocephalic and atraumatic. Eyes:  Without icterus, sclera clear and conjunctiva pink.  Ears:  Normal auditory acuity. Cardiovascular:  S1, S2 present without murmurs appreciated. Extremities without clubbing or edema. Respiratory:  Clear to auscultation bilaterally. No wheezes, rales, or rhonchi. No distress.  Gastrointestinal:  +BS, soft, and non-distended. Minimal TTP mid-abdomen. No signs of acute abdomen. No HSM noted. No guarding or rebound. No masses appreciated.  Rectal:  Deferred  Musculoskalatal:  Symmetrical without gross deformities. Neurologic:  Alert and oriented x4;  grossly normal neurologically. Psych:  Alert and cooperative. Normal mood and affect. Heme/Lymph/Immune: No excessive bruising noted.    12/07/2016 4:18 PM   Disclaimer: This note was dictated with voice recognition software. Similar sounding words can inadvertently be transcribed and may not be corrected upon review.

## 2016-12-08 NOTE — Progress Notes (Signed)
cc'ed to pcp °

## 2017-01-18 ENCOUNTER — Ambulatory Visit: Payer: Commercial Managed Care - PPO | Admitting: Nurse Practitioner

## 2017-02-06 ENCOUNTER — Other Ambulatory Visit: Payer: Self-pay | Admitting: Adult Health

## 2017-03-02 ENCOUNTER — Other Ambulatory Visit: Payer: Self-pay | Admitting: Adult Health

## 2017-03-16 ENCOUNTER — Encounter (HOSPITAL_COMMUNITY): Payer: Self-pay | Admitting: Emergency Medicine

## 2017-03-16 ENCOUNTER — Observation Stay (HOSPITAL_COMMUNITY)
Admission: EM | Admit: 2017-03-16 | Discharge: 2017-03-17 | Disposition: A | Discharge status: Home / Self Care | Payer: Commercial Managed Care - PPO | Attending: Internal Medicine | Admitting: Internal Medicine

## 2017-03-16 ENCOUNTER — Emergency Department (HOSPITAL_COMMUNITY): Payer: Commercial Managed Care - PPO

## 2017-03-16 DIAGNOSIS — I25119 Atherosclerotic heart disease of native coronary artery with unspecified angina pectoris: Secondary | ICD-10-CM

## 2017-03-16 DIAGNOSIS — E785 Hyperlipidemia, unspecified: Secondary | ICD-10-CM | POA: Diagnosis not present

## 2017-03-16 DIAGNOSIS — Z720 Tobacco use: Secondary | ICD-10-CM | POA: Diagnosis not present

## 2017-03-16 DIAGNOSIS — E784 Other hyperlipidemia: Secondary | ICD-10-CM

## 2017-03-16 DIAGNOSIS — I2510 Atherosclerotic heart disease of native coronary artery without angina pectoris: Secondary | ICD-10-CM

## 2017-03-16 DIAGNOSIS — F17210 Nicotine dependence, cigarettes, uncomplicated: Secondary | ICD-10-CM | POA: Insufficient documentation

## 2017-03-16 DIAGNOSIS — R0602 Shortness of breath: Secondary | ICD-10-CM | POA: Diagnosis not present

## 2017-03-16 DIAGNOSIS — Z7982 Long term (current) use of aspirin: Secondary | ICD-10-CM | POA: Insufficient documentation

## 2017-03-16 DIAGNOSIS — I10 Essential (primary) hypertension: Secondary | ICD-10-CM | POA: Diagnosis not present

## 2017-03-16 DIAGNOSIS — R072 Precordial pain: Principal | ICD-10-CM | POA: Diagnosis present

## 2017-03-16 DIAGNOSIS — F17200 Nicotine dependence, unspecified, uncomplicated: Secondary | ICD-10-CM | POA: Diagnosis not present

## 2017-03-16 DIAGNOSIS — R0789 Other chest pain: Secondary | ICD-10-CM | POA: Diagnosis not present

## 2017-03-16 DIAGNOSIS — Z79899 Other long term (current) drug therapy: Secondary | ICD-10-CM | POA: Insufficient documentation

## 2017-03-16 DIAGNOSIS — E782 Mixed hyperlipidemia: Secondary | ICD-10-CM | POA: Diagnosis not present

## 2017-03-16 DIAGNOSIS — I25811 Atherosclerosis of native coronary artery of transplanted heart without angina pectoris: Secondary | ICD-10-CM

## 2017-03-16 DIAGNOSIS — R079 Chest pain, unspecified: Secondary | ICD-10-CM | POA: Diagnosis present

## 2017-03-16 DIAGNOSIS — E7800 Pure hypercholesterolemia, unspecified: Secondary | ICD-10-CM | POA: Diagnosis present

## 2017-03-16 HISTORY — DX: Non-ST elevation (NSTEMI) myocardial infarction: I21.4

## 2017-03-16 LAB — LIPID PANEL
CHOLESTEROL: 155 mg/dL (ref 0–200)
HDL: 45 mg/dL (ref >40)
LDL Cholesterol: 90 mg/dL (ref 0–99)
Total CHOL/HDL Ratio: 3.4 RATIO
Triglycerides: 99 mg/dL (ref <150)
VLDL: 20 mg/dL (ref 0–40)

## 2017-03-16 LAB — BASIC METABOLIC PANEL
ANION GAP: 6 (ref 5–15)
BUN: 26 mg/dL — AB (ref 6–20)
CO2: 28 mmol/L (ref 22–32)
Calcium: 9.1 mg/dL (ref 8.9–10.3)
Chloride: 104 mmol/L (ref 101–111)
Creatinine, Ser: 0.91 mg/dL (ref 0.61–1.24)
Glucose, Bld: 92 mg/dL (ref 65–99)
POTASSIUM: 4 mmol/L (ref 3.5–5.1)
SODIUM: 138 mmol/L (ref 135–145)

## 2017-03-16 LAB — CBC
HEMATOCRIT: 46.9 % (ref 39.0–52.0)
Hemoglobin: 15.9 g/dL (ref 13.0–17.0)
MCH: 31.4 pg (ref 26.0–34.0)
MCHC: 33.9 g/dL (ref 30.0–36.0)
MCV: 92.7 fL (ref 78.0–100.0)
Platelets: 201 10*3/uL (ref 150–400)
RBC: 5.06 MIL/uL (ref 4.22–5.81)
RDW: 12.7 % (ref 11.5–15.5)
WBC: 7.5 10*3/uL (ref 4.0–10.5)

## 2017-03-16 LAB — TROPONIN I
Troponin I: <0.03 ng/mL (ref <0.03)
Troponin I: <0.03 ng/mL (ref <0.03)
Troponin I: <0.03 ng/mL (ref <0.03)

## 2017-03-16 MED ORDER — ONDANSETRON HCL 4 MG/2ML IJ SOLN: 4.0000 mg | Freq: Four times a day (QID) | INTRAMUSCULAR | Status: DC | PRN

## 2017-03-16 MED ORDER — LISINOPRIL 5 MG PO TABS
2.5000 mg | ORAL_TABLET | Freq: Every day | ORAL | Status: DC
  Administered 2017-03-17: 2.5 mg via ORAL
  Filled 2017-03-16: qty 1

## 2017-03-16 MED ORDER — ENOXAPARIN SODIUM 40 MG/0.4ML ~~LOC~~ SOLN
40.0000 mg | SUBCUTANEOUS | Status: DC
  Administered 2017-03-16: 40 mg via SUBCUTANEOUS
  Filled 2017-03-16: qty 0.4

## 2017-03-16 MED ORDER — METOPROLOL TARTRATE 25 MG PO TABS
12.5000 mg | ORAL_TABLET | Freq: Two times a day (BID) | ORAL | Status: DC
  Administered 2017-03-16 – 2017-03-17 (×2): 12.5 mg via ORAL
  Filled 2017-03-16 (×2): qty 1

## 2017-03-16 MED ORDER — CLOPIDOGREL BISULFATE 75 MG PO TABS
75.0000 mg | ORAL_TABLET | Freq: Every day | ORAL | Status: DC
  Administered 2017-03-16 – 2017-03-17 (×2): 75 mg via ORAL
  Filled 2017-03-16 (×2): qty 1

## 2017-03-16 MED ORDER — MORPHINE SULFATE (PF) 4 MG/ML IV SOLN
2.0000 mg | Freq: Once | INTRAVENOUS | Status: AC
  Administered 2017-03-16: 2 mg via INTRAVENOUS
  Filled 2017-03-16: qty 1

## 2017-03-16 MED ORDER — NICOTINE 21 MG/24HR TD PT24
21.0000 mg | MEDICATED_PATCH | Freq: Every day | TRANSDERMAL | Status: DC
  Administered 2017-03-16 – 2017-03-17 (×2): 21 mg via TRANSDERMAL
  Filled 2017-03-16 (×2): qty 1

## 2017-03-16 MED ORDER — ATORVASTATIN CALCIUM 40 MG PO TABS
80.0000 mg | ORAL_TABLET | Freq: Every day | ORAL | Status: DC
  Administered 2017-03-16: 80 mg via ORAL
  Filled 2017-03-16: qty 2

## 2017-03-16 MED ORDER — ALBUTEROL SULFATE (2.5 MG/3ML) 0.083% IN NEBU: 3.0000 mL | INHALATION_SOLUTION | Freq: Every day | RESPIRATORY_TRACT | Status: DC | PRN

## 2017-03-16 MED ORDER — PANTOPRAZOLE SODIUM 40 MG PO TBEC
40.0000 mg | DELAYED_RELEASE_TABLET | Freq: Every day | ORAL | Status: DC
  Administered 2017-03-17: 40 mg via ORAL
  Filled 2017-03-16: qty 1

## 2017-03-16 MED ORDER — ASPIRIN EC 81 MG PO TBEC
81.0000 mg | DELAYED_RELEASE_TABLET | Freq: Every day | ORAL | Status: DC
  Administered 2017-03-17: 81 mg via ORAL
  Filled 2017-03-16: qty 1

## 2017-03-16 MED ORDER — ONDANSETRON HCL 4 MG PO TABS: 4.0000 mg | ORAL_TABLET | Freq: Four times a day (QID) | ORAL | Status: DC | PRN

## 2017-03-16 MED ORDER — ACETAMINOPHEN 650 MG RE SUPP: 650.0000 mg | Freq: Four times a day (QID) | RECTAL | Status: DC | PRN

## 2017-03-16 MED ORDER — ACETAMINOPHEN 325 MG PO TABS: 650.0000 mg | ORAL_TABLET | Freq: Four times a day (QID) | ORAL | Status: DC | PRN

## 2017-03-16 NOTE — Consult Note (Signed)
Cardiology Consultation:   Patient ID: Anthony Hensley; [id number]; Oct 08, 1961   Admit date: 03/16/2017 Date of Consult: 03/16/2017  Primary Care Provider: Jake Samples, PA-C Primary Cardiologist: Kate Sable MD (last seen 2016)  Patient Profile:   Anthony Hensley is a 56 y.o. male with a hx of CAD status post DES to mid circumflex in the setting of NSTEMI back in October 2016, hypertension, hypercholesterolemia, and ongoing tobacco abuse who is being seen today for the evaluation of recurrent chest pain at the request of Dr. Carles Collet.   History of Present Illness:   Anthony Hensley states that he developed a focal, sharp, left-sided chest discomfort yesterday evening at rest. It was relatively mild, he went to bed, and then when he awoke today he had recurrent discomfort that was more intense. He took 2 nitroglycerin tablets without relief and came to the ER. Morphine has been more effective in the ER. He otherwise denies any exertional chest pain or increasing dyspnea on exertion. He does state that symptoms are somewhat similar to what he experienced back in 2016. Of note, he did undergo a follow-up Myoview study back in January of this year that revealed a moderate sized inferior defect consistent with scar and peri-infarct ischemia, LVEF 60%.  Past Medical History:  Diagnosis Date  . CAD (coronary artery disease)    DES to mid circumflex October 2016  . Diverticulosis   . Hypercholesterolemia   . Hypertension   . NSTEMI (non-ST elevated myocardial infarction) [facility])    October 2016    Past Surgical History:  Procedure Laterality Date  . CARDIAC CATHETERIZATION N/A 07/13/2015   Procedure: Left Heart Cath and Coronary Angiography;  Surgeon: Burnell Blanks, MD;  Location: [place] [facility];  Service: Cardiovascular;  Laterality: N/A;  . CARDIAC CATHETERIZATION N/A 07/13/2015   PCI + DES to the mid circ. LVEF was normal at 65%  . COLONOSCOPY N/A 12/31/2015   Dr.Rourk-  diverticulosis,45mm polyp in the splenic flexure, 92mm polyp in the splenic flexure, 63mm polyp in the sigmoid colon bx= traditional serrated adenoma  . ESOPHAGOGASTRODUODENOSCOPY N/A 12/31/2015   Dr.Rourk- esophagitis with no bleeding, diffuse moderately erythematous mucosa without bleeding was found in the entire examined stomach. stomach bx= slight chronic inflammation. esophagus bx= benign gastresophageal junction mucosa  . MALONEY DILATION N/A 12/31/2015   Procedure: Venia Minks DILATION;  Surgeon: [name], MD;  Location: [facility];  Service: Endoscopy;  Laterality: N/A;     Outpatient Medications: No current facility-administered medications on file prior to encounter.    Current Outpatient Prescriptions on File Prior to Encounter  Medication Sig Dispense Refill  . albuterol (PROVENTIL) (2.5 MG/3ML) 0.083% nebulizer solution Take 3 mLs by nebulization daily as needed for shortness of breath.  3  . aspirin 81 MG EC tablet Take 1 tablet (81 mg total) by mouth daily. 30 tablet   . atorvastatin (LIPITOR) 80 MG tablet TAKE 1 TABLET BY MOUTH EVERY DAY AT 6 p.m. 30 tablet 6  . clopidogrel (PLAVIX) 75 MG tablet TAKE 1 TABLET BY MOUTH EVERY DAY WITH BREAKFAST 30 tablet 6  . enalapril (VASOTEC) 10 MG tablet TAKE 1 TABLET BY MOUTH EVERY DAY 30 tablet 3  . Glycerin-Hypromellose-PEG 400 (VISINE TEARS OP) Apply 1 drop to eye daily as needed (dry eyes).     Marland Kitchen lisinopril (PRINIVIL,ZESTRIL) 2.5 MG tablet TAKE 1 TABLET BY MOUTH daily 30 tablet 6  . metoprolol tartrate (LOPRESSOR) 25 MG tablet Take 0.5 tablets (12.5  mg total) by mouth 2 (two) times daily. 90 tablet 3  . NITROSTAT 0.4 MG SL tablet Place 1 tablet under the tongue daily as needed for chest pain.  1  . PROAIR HFA 108 (90 Base) MCG/ACT inhaler Inhale 2 puffs into the lungs every 4 (four) hours as needed for shortness of breath.  3  . RABEprazole (ACIPHEX) 20 MG tablet Take 1 tablet (20 mg total) by mouth daily. 30 tablet 5  . triamcinolone  lotion (KENALOG) 0.1 % Apply 1 application topically 2 (two) times daily as needed (rash).       Allergies:   No Known Allergies  Social History:   Social History   Social History  . Marital status: Married    Spouse name: N/A  . Number of children: N/A  . Years of education: N/A   Occupational History  . Machine Teacher, English as a foreign language And [employer]   Social History Main Topics  . Smoking status: Current Some Day Smoker    Packs/day: 0.75    Years: 35.00    Types: Cigarettes  . Smokeless tobacco: Never Used  . Alcohol use No  . Drug use: No  . Sexual activity: Not on file   Other Topics Concern  . Not on file   Social History Narrative  . No narrative on file    Family History:   The patient's family history includes Heart attack (age of onset: 30) in his father. There is no history of Colon cancer.  ROS:  Please see the history of present illness.  ROS  All other ROS reviewed and negative.     Physical Exam/Data:   Vitals:   03/16/17 0930 03/16/17 1001 03/16/17 1030 03/16/17 1100  BP: 126/82 116/84 107/70 97/72  Pulse: (!) 55 (!) 53 (!) 52 (!) 53  Resp: 13 11 10  (!) 9  Temp:      TempSrc:      SpO2: 99% 96% 94% 93%  Weight:      Height:       No intake or output data in the 24 hours ending 03/16/17 1130 Filed Weights   03/16/17 0703  Weight: 155 lb (70.3 kg)   Body mass index is 25.02 kg/m.   Gen.: Patient appears comfortable at rest. HEENT: Conjunctiva and lids normal, oropharynx clear. Neck: Supple, no elevated JVP or carotid bruits, no thyromegaly. Lungs: Clear to auscultation, nonlabored breathing at rest. Cardiac: Regular rate and rhythm, no S3 or significant systolic murmur, no pericardial rub. Abdomen: Soft, nontender, bowel sounds present, no guarding or rebound. Extremities: No pitting edema, distal pulses 2+. Skin: Warm and dry. Musculoskeletal: No kyphosis. Neuropsychiatric: Alert and oriented x3, affect grossly  appropriate.  EKG:  The EKG from 03/16/2017 was personally reviewed and demonstrates sinus rhythm with R prime in lead V1.  Relevant CV Studies:  Exercise Myoview 10/21/2016:  Blood pressure demonstrated a normal response to exercise.  There was no ST segment deviation noted during stress.  Defect 1: There is a medium defect of moderate severity present in the basal inferoseptal, basal inferior, mid inferoseptal, mid inferior and apical inferior location.  Findings consistent with probable prior myocardial infarction with peri-infarct ischemia.  This is a low to intermediate risk study.  Nuclear stress EF: 60%.  Echocardiogram 10/21/2016: Left ventricle: The cavity size was normal. Wall thickness was normal. Systolic function was normal. The estimated ejection fraction was in the range of 60% to 65%. Wall motion was normal; there were no  regional wall motion abnormalities. Doppler parameters are consistent with abnormal left ventricular relaxation (grade 1 diastolic dysfunction). - Aortic valve: There was mild regurgitation.  Cardiac Cath 07/12/2017: Conclusion    Prox RCA lesion, 20% stenosed.  Dist Cx lesion, 70% stenosed.  Prox LAD-1 lesion, 20% stenosed.  Prox LAD-2 lesion, 20% stenosed.  Mid LAD lesion, 20% stenosed.  1st Diag lesion, 30% stenosed.  Mid Cx lesion, 99% stenosed. There is a 0% residual stenosis post intervention.  A drug-eluting stent was placed extending from the mid Circumfelx into the Obtuse marginal branch  The left ventricular systolic function is normal.   1. NSTEMI/Unstable angina secondary to severe stenosis mid Circumflex.  2. Successful PTCA/DES x 1 mid Circumflex 3. Normal LV systolic function   Laboratory Data:  Chemistry  Recent Labs Lab 03/16/17 0719  NA 138  K 4.0  CL 104  CO2 28  GLUCOSE 92  BUN 26*  CREATININE 0.91  CALCIUM 9.1  GFRNONAA >60  GFRAA >60  ANIONGAP 6    Hematology  Recent Labs Lab  03/16/17 0719  WBC 7.5  RBC 5.06  HGB 15.9  HCT 46.9  MCV 92.7  MCH 31.4  MCHC 33.9  RDW 12.7  PLT 201   Cardiac Enzymes  Recent Labs Lab 03/16/17 0719  TROPONINI <0.03   No results for input(s): TROPIPOC in the last 168 hours.    Radiology/Studies:  Dg Chest 2 View  Result Date: 03/16/2017 CLINICAL DATA:  Chest pain EXAM: CHEST  2 VIEW COMPARISON:  July 11, 2015 FINDINGS: There is no edema or consolidation. The heart size and pulmonary vascularity are normal. No adenopathy. No bone lesions. IMPRESSION: No edema or consolidation. Electronically Signed   By: Lowella Grip III M.D.   On: 03/16/2017 07:57    Assessment and Plan:   1. Atypical chest pain, recent onset at rest. Patient reports somewhat similar to symptoms back in 2016, however has not had any recent exertional limitations, and his initial cardiac markers are normal. ECG nonspecific.  2. CAD status post NSTEMI in October 2016. He underwent placement of DES to the mid circumflex extending into the obtuse marginal at that time. Otherwise nonobstructive disease. He reports compliance with his medications. Current cardiac regimen includes aspirin, statin, Plavix, ACE inhibitor, and as needed nitroglycerin.  3. Ongoing tobacco abuse. Smoking cessation recommended.  4. Essential hypertension.  5. Hyperlipidemia, on statin therapy. Follow-up FLP pending.  Patient being admitted to the hospitalist service for observation. Would cycle full set of cardiac markers and follow-up ECG in the morning. Further recommendations to follow pending clinical progress and follow-up testing. Not clear that we would pursue a follow-up stress test since he just had one in January and had evidence of scar with peri-infarct ischemia. He might be able to be managed medically, perhaps with addition of long-acting nitrate with close office follow-up, next step would be a repeat cardiac catheterization if he continues to have  symptoms.  Signed, Jory Sims, DNP, ANP  03/16/2017 11:30 AM    Attending note:  Patient seen and examined. I reviewed his records and updated the chart. I discussed the case with Ms. Lawrence DNP, and I completed the above note in its entirety reflecting both my findings and recommendations.  Satira Sark, M.D., F.A.C.C.

## 2017-03-16 NOTE — ED Triage Notes (Signed)
Pt reports chest pain starting yesterday afternoon with no radiation increasing through the night.  Took 2 nitro with no relief.  Also reports shortness of breath with no n/v.

## 2017-03-16 NOTE — ED Provider Notes (Signed)
[facility] DEPT Provider Note   CSN: [id number] Arrival date & time: 03/16/17  5277     History   Chief Complaint Chief Complaint  Patient presents with  . Chest Pain    HPI Anthony Hensley is a 56 y.o. male presenting with a 1 day h/o CP.   Pt states he was at home resting yesterday when he starting experiencing sharp CP. The pain did not radiate anywhere, and he had no N/V, dizziness, diaphoreses or SOB. When he woke up this morning, his pain was worse, unrelieved by ASA and 2 nitro, and he expresses som SOB due to pain with inspiration. He has a PMH including NSTEMI with stent placement in 2016. He states this pain feels like when he had a NSTEMI. He denies fever, chills, abd pain, N/V, or leg swelling. He denies neurologic symptoms including numbness or tingling, speech difficulty, or headaches.    HPI  Past Medical History:  Diagnosis Date  . CAD (coronary artery disease)   . Diverticulosis   . Hypercholesterolemia   . Hypertension     Patient Active Problem List   Diagnosis Date Noted  . 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 Precordial chest pain   . Diarrhea 09/26/2016  . Enteritis 09/26/2016  . GERD (gastroesophageal reflux disease) 06/08/2016  . Constipation 06/08/2016  . Mucosal abnormality of stomach   . Mucosal abnormality of esophagus   . History of colonic polyps   . Diverticulosis of colon without hemorrhage   . Dysphagia 11/27/2015  . Rectal bleeding 11/27/2015  . Abdominal pain 11/27/2015  . Tobacco abuse 07/27/2015  . Chest pain at rest   . NSTEMI (non-ST elevated myocardial infarction) ([place])   . ACS (acute coronary syndrome) ([place]) 07/12/2015  . Hyperlipidemia 07/12/2015  . Chest pain 07/11/2015  . Essential hypertension 07/11/2015  . LUMBAR SPRAIN AND STRAIN 06/08/2009    Past Surgical History:  Procedure Laterality Date  . CARDIAC CATHETERIZATION N/A 07/13/2015   Procedure: Left Heart Cath and Coronary Angiography;  Surgeon: Burnell Blanks, MD;  Location: [facility];  Service: Cardiovascular;  Laterality: N/A;  . CARDIAC CATHETERIZATION N/A 07/13/2015   PCI + DES to the mid circ. LVEF was normal at 65%  . COLONOSCOPY N/A 12/31/2015   Dr.Rourk- diverticulosis,2mm polyp in the splenic flexure, 56mm polyp in the splenic flexure, 61mm polyp in the sigmoid colon bx= traditional serrated adenoma  . ESOPHAGOGASTRODUODENOSCOPY N/A 12/31/2015   Dr.Rourk- esophagitis with no bleeding, diffuse moderately erythematous mucosa without bleeding was found in the entire examined stomach. stomach bx= slight chronic inflammation. esophagus bx= benign gastresophageal junction mucosa  . MALONEY DILATION N/A 12/31/2015   Procedure: Venia Minks DILATION;  Surgeon: [name], MD;  Location[facility];  Service: Endoscopy;  Laterality: N/A;       Home Medications    Prior to Admission medications   Medication Sig Start Date End Date Taking? Authorizing Provider  albuterol (PROVENTIL) (2.5 MG/3ML) 0.083% nebulizer solution Take 3 mLs by nebulization daily as needed for shortness of breath. 08/04/16   [provider]  aspirin 81 MG EC tablet Take 1 tablet (81 mg total) by mouth daily. 07/14/15   Lyda Jester M, PA-C  atorvastatin (LIPITOR) 80 MG tablet TAKE 1 TABLET BY MOUTH EVERY DAY AT 6 p.m. 03/02/17   [name], NP  clopidogrel (PLAVIX) 75 MG tablet TAKE 1 TABLET BY MOUTH EVERY DAY WITH BREAKFAST 03/02/17  [name], NP  enalapril (VASOTEC) 10 MG tablet TAKE 1 TABLET BY MOUTH EVERY DAY 11/07/16   [name], NP  Glycerin-Hypromellose-PEG 400 (VISINE TEARS OP) Apply 1 drop to eye daily as needed (dry eyes).     [provider]  lisinopril (PRINIVIL,ZESTRIL) 2.5 MG tablet TAKE 1 TABLET BY MOUTH daily 02/06/17   [name], NP  metoprolol tartrate (LOPRESSOR) 25 MG tablet Take 0.5 tablets (12.5 mg total)  by mouth 2 (two) times daily. 07/19/16   [name], NP  NITROSTAT 0.4 MG SL tablet Place 1 tablet under the tongue daily as needed for chest pain. 07/02/16   [provider]  PROAIR HFA 108 (90 Base) MCG/ACT inhaler Inhale 2 puffs into the lungs every 4 (four) hours as needed for shortness of breath. 08/01/16   [provider]  RABEprazole (ACIPHEX) 20 MG tablet Take 1 tablet (20 mg total) by mouth daily. 11/24/16   [name], NP  triamcinolone lotion (KENALOG) 0.1 % Apply 1 application topically 2 (two) times daily as needed (rash).     [provider]    Family History Family History  Problem Relation Age of Onset  . Heart attack Father 48       Deceased  . Colon cancer Neg Hx     Social History Social History  Substance Use Topics  . Smoking status: Current Some Day Smoker    Packs/day: 0.75    Years: 35.00    Types: Cigarettes  . Smokeless tobacco: Never Used  . Alcohol use No     Allergies   Patient has no known allergies.   Review of Systems Review of Systems  Constitutional: Negative for chills and fever.  HENT: Negative for sore throat.   Eyes: Negative for photophobia and visual disturbance.  Respiratory: Positive for shortness of breath (pain with inspiration). Negative for cough and chest tightness.   Cardiovascular: Positive for chest pain. Negative for palpitations and leg swelling.  Gastrointestinal: Negative for abdominal pain, nausea and vomiting.  Musculoskeletal: Negative for back pain, neck pain and neck stiffness.  Skin: Negative for rash.  Neurological: Negative for dizziness and headaches.  Psychiatric/Behavioral: Negative for confusion.     Physical Exam Updated Vital Signs BP 135/79   Pulse 70   Temp 97.5 F (36.4 C) (Oral)   Resp 13   Ht 5\' 6"  (1.676 m)   Wt 70.3 kg (155 lb)   SpO2 96%   BMI 25.02 kg/m   Physical Exam  Constitutional: He is oriented to person, place, and time. He appears  well-developed and well-nourished. No distress.  HENT:  Head: Normocephalic and atraumatic.  Eyes: Conjunctivae and EOM are normal. Pupils are equal, round, and reactive to light.  Neck: Normal range of motion. Neck supple.  Cardiovascular: Normal rate, regular rhythm, normal heart sounds and intact distal pulses.  Exam reveals no gallop and no friction rub.   No murmur heard. Pulmonary/Chest: Effort normal and breath sounds normal. No respiratory distress. He has no wheezes.  Abdominal: Soft. He exhibits no distension. There is no tenderness.  Musculoskeletal: Normal range of motion.  Neurological: He is alert and oriented to person, place, and time.  Skin: Skin is warm and dry. He is not diaphoretic.  Psychiatric: He has a normal mood and affect.  Nursing note and vitals reviewed.    ED Treatments / Results  Labs (all labs ordered are listed, but only abnormal results are displayed) Labs Reviewed  BASIC METABOLIC PANEL - Abnormal; Notable for the following:       Result Value   BUN 26 (*)    All other components within normal limits  CBC  TROPONIN I    EKG  EKG Interpretation None       Radiology Dg Chest 2 View  Result Date: 03/16/2017 CLINICAL DATA:  Chest pain EXAM: CHEST  2 VIEW COMPARISON:  July 11, 2015 FINDINGS: There is no edema or consolidation. The heart size and pulmonary vascularity are normal. No adenopathy. No bone lesions. IMPRESSION: No edema or consolidation. Electronically Signed   By: Lowella Grip III M.D.   On: 03/16/2017 07:57    Procedures Procedures (including critical care time)  Medications Ordered in ED Medications  morphine 4 MG/ML injection 2 mg (2 mg Intravenous Given 03/16/17 0929)     Initial Impression / Assessment and Plan / ED Course  I have reviewed the triage vital signs and the nursing notes.  Pertinent labs & imaging results that were available during my care of the patient were reviewed by me and considered in my  medical decision making (see chart for details).  Clinical Course as of Mar 16 1008  Thu Mar 16, 2017  0730 Initial assessment concerning as pt's sxs similar to last coronary event. Will order troponin. EKG without signs of STEMI. No indication of pulmonary causes- cxr negative.   [[place]]  0815 Troponin and labs reassuring. Will contact hospitalist regarding admission due to history for further eval of CP.   [[place]]  [phone] Discussed with hospitalist at 8:44 AM - will admit Morphine given for pain  Dr. Sabra Heck  [BM]    Clinical Course User Index [BM] Noemi Chapel, MD [[place]] Franchot Heidelberg, PA-C      Final Clinical Impressions(s) / ED Diagnoses   Final diagnoses:  Precordial chest pain    New Prescriptions New Prescriptions   No medications on file     Erick Alley 03/16/17 1010    Noemi Chapel, MD 03/16/17 1517

## 2017-03-16 NOTE — H&P (Signed)
History and Physical  UBALDO Hensley KDT:[id number] DOB: [birth year] DOA: 03/16/2017   PCP: Jake Samples, PA-C   Patient coming from: Home  Chief Complaint: chest pain  HPI:  Anthony Hensley is a 56 y.o. male with medical history of coronary artery disease status post PCI with DES to the mid circumflex on 07/13/2015, hypertension, hyperlipidemia, tobacco abuse presents with sharp substernal and left sided chest pain that began around 9 PM on 03/15/2017. The patient states that he began "feeling drained" on afternoon of 03/15/2017. He had some associated shortness of breath with chest pain without radiation. He denied any nausea, vomiting, diaphoresis, dizziness, syncope. The patient went to bed, but he woke up with worsening chest pain. He took 2 nitroglycerin at home without improvement. Fortunately, he continues to smoke 1 pack per day. He states that his chest discomfort is a little worse with movement and activity.  The patient had a Myoview on 10/21/2016 which was low to intermediate probability with EF 60%. He is compliant with his aspirin and Plavix missing only one dose in the past month. On 07/13/15 he underwent a LHC by Dr. Angelena Form. Marland Kitchen He was found to have severe, 99% stenosis of the mid circumflex. He underwent successful PCI + DES to the mid circ. LVEF was normal at 65%. There was mild nonobstructive residual disease.   In the emergency room, the patient was afebrile and hemodynamically stable saturating 99% on room air. BMP and CBC were unremarkable. Initial troponin was negative. Dissection was negative. EKG showed incomplete right bundle branch block with nonspecific T-wave changes.  Assessment/Plan: Chest pain -The patient has typical and atypical components -It is partly reproducible with palpation -Given the patient's coronary history and continued tobacco use with hyperlipidemia, consult cardiology -Cycle troponins -10/21/2016 echo EF 60-55 percent, no WMA, grade 1  DD -Check lipid panel and hemoglobin A 1C  Hypertension -Continue lisinopril and metoprolol tartrate  Coronary artery disease -07/13/15--cath--severe, 99% stenosis of the mid circumflex. He underwent successful PCI + DES to the mid circ. LVEF was normal at 65%. There was mild nonobstructive residual disease. -Continue aspirin, Plavix, statin  Tobacco abuse  -Tobacco cessation discussed  -NicoDerm patch   GERD -Continue PPI  Active Problems:   Chest pain       Past Medical History:  Diagnosis Date  . CAD (coronary artery disease)   . Diverticulosis   . Hypercholesterolemia   . Hypertension    Past Surgical History:  Procedure Laterality Date  . CARDIAC CATHETERIZATION N/A 07/13/2015   Procedure: Left Heart Cath and Coronary Angiography;  Surgeon: Burnell Blanks, MD;  Location: [facility];  Service: Cardiovascular;  Laterality: N/A;  . CARDIAC CATHETERIZATION N/A 07/13/2015   PCI + DES to the mid circ. LVEF was normal at 65%  . COLONOSCOPY N/A 12/31/2015   Dr.Rourk- diverticulosis,7mm polyp in the splenic flexure, 29mm polyp in the splenic flexure, 88mm polyp in the sigmoid colon bx= traditional serrated adenoma  . ESOPHAGOGASTRODUODENOSCOPY N/A 12/31/2015   Dr.Rourk- esophagitis with no bleeding, diffuse moderately erythematous mucosa without bleeding was found in the entire examined stomach. stomach bx= slight chronic inflammation. esophagus bx= benign gastresophageal junction mucosa  . MALONEY DILATION N/A 12/31/2015   Procedure: Venia Minks DILATION;  Surgeon: [name], MD;  [facility];  Service: Endoscopy;  Laterality: N/A;   Social History:  reports that he has been smoking Cigarettes.  He has a 26.25 pack-year smoking history. He has  never used smokeless tobacco. He reports that he does not drink alcohol or use drugs.   Family History  Problem Relation Age of Onset  . Heart attack Father 50       Deceased  . Colon cancer Neg Hx       No Known Allergies   Prior to Admission medications   Medication Sig Start Date End Date Taking? Authorizing Provider  albuterol (PROVENTIL) (2.5 MG/3ML) 0.083% nebulizer solution Take 3 mLs by nebulization daily as needed for shortness of breath. 08/04/16   [provider]  aspirin 81 MG EC tablet Take 1 tablet (81 mg total) by mouth daily. 07/14/15   Lyda Jester M, PA-C  atorvastatin (LIPITOR) 80 MG tablet TAKE 1 TABLET BY MOUTH EVERY DAY AT 6 p.m. 03/02/17   [name], NP  clopidogrel (PLAVIX) 75 MG tablet TAKE 1 TABLET BY MOUTH EVERY DAY WITH BREAKFAST 03/02/17   [name], NP  enalapril (VASOTEC) 10 MG tablet TAKE 1 TABLET BY MOUTH EVERY DAY 11/07/16   [name], NP  Glycerin-Hypromellose-PEG 400 (VISINE TEARS OP) Apply 1 drop to eye daily as needed (dry eyes).     [provider]  lisinopril (PRINIVIL,ZESTRIL) 2.5 MG tablet TAKE 1 TABLET BY MOUTH daily 02/06/17   [name], NP  metoprolol tartrate (LOPRESSOR) 25 MG tablet Take 0.5 tablets (12.5 mg total) by mouth 2 (two) times daily. 07/19/16   [name], NP  NITROSTAT 0.4 MG SL tablet Place 1 tablet under the tongue daily as needed for chest pain. 07/02/16   [provider]  PROAIR HFA 108 (90 Base) MCG/ACT inhaler Inhale 2 puffs into the lungs every 4 (four) hours as needed for shortness of breath. 08/01/16   [provider]  RABEprazole (ACIPHEX) 20 MG tablet Take 1 tablet (20 mg total) by mouth daily. 11/24/16   [name], NP  triamcinolone lotion (KENALOG) 0.1 % Apply 1 application topically 2 (two) times daily as needed (rash).     [provider]    Review of Systems:  Constitutional:  No weight loss, night sweats, Fevers, chills, fatigue.  Head&Eyes: No headache.  No vision loss.  No eye pain or scotoma ENT:  No Difficulty swallowing,Tooth/dental problems,Sore throat,  No ear ache, post nasal drip,  Cardio-vascular:   NoOrthopnea, PND, swelling in lower extremities,  dizziness, palpitations  GI:  No  abdominal pain, nausea, vomiting, diarrhea, loss of appetite, hematochezia, melena, heartburn, indigestion, Resp:   No cough. No coughing up of blood .No wheezing.No chest wall deformity  Skin:  no rash or lesions.  GU:  no dysuria, change in color of urine, no urgency or frequency. No flank pain.  Musculoskeletal:  No joint pain or swelling. No decreased range of motion. No back pain.  Psych:  No change in mood or affect. No depression or anxiety. Neurologic: No headache, no dysesthesia, no focal weakness, no vision loss. No syncope  Physical Exam: Vitals:   03/16/17 0705 03/16/17 0730 03/16/17 0800 03/16/17 0839  BP: (!) 141/96 (!) 125/91 116/88 125/83  Pulse: (!) 59 (!) 56 (!) 52 (!) 54  Resp: 18 15 15 14   Temp: 97.5 F (36.4 C)     TempSrc: Oral     SpO2: 99% 95% 97% 97%  Weight:      Height:       General:  A&O x 3, NAD, nontoxic, pleasant/cooperative Head/Eye: No conjunctival hemorrhage, no icterus, [place]/AT, No nystagmus ENT:  No  icterus,  No thrush, good dentition, no pharyngeal exudate Neck:  No masses, no lymphadenpathy, no bruits CV:  RRR, no rub, no gallop, no S3; chest pain reproducible on palpation Lung:  Diminished BS, but CTAB, good air movement, no wheeze, no rhonchi Abdomen: soft/NT, +BS, nondistended, no peritoneal signs Ext: No cyanosis, No rashes, No petechiae, No lymphangitis, No edema Neuro: CNII-XII intact, strength 4/5 in bilateral upper and lower extremities, no dysmetria  Labs on Admission:  Basic Metabolic Panel:  Recent Labs Lab 03/16/17 0719  NA 138  K 4.0  CL 104  CO2 28  GLUCOSE 92  BUN 26*  CREATININE 0.91  CALCIUM 9.1   Liver Function Tests: No results for input(s): AST, ALT, ALKPHOS, BILITOT, PROT, ALBUMIN in the last 168 hours. No results for input(s): LIPASE, AMYLASE in the last 168 hours. No results for input(s): AMMONIA in the last 168  hours. CBC:  Recent Labs Lab 03/16/17 0719  WBC 7.5  HGB 15.9  HCT 46.9  MCV 92.7  PLT 201   Coagulation Profile: No results for input(s): INR, PROTIME in the last 168 hours. Cardiac Enzymes:  Recent Labs Lab 03/16/17 0719  TROPONINI <0.03   BNP: Invalid input(s): POCBNP CBG: No results for input(s): GLUCAP in the last 168 hours. Urine analysis:    Component Value Date/Time   COLORURINE YELLOW 09/26/2016 0245   APPEARANCEUR CLEAR 09/26/2016 0245   LABSPEC >1.030 (H) 09/26/2016 0245   PHURINE 5.5 09/26/2016 0245   GLUCOSEU NEGATIVE 09/26/2016 0245   HGBUR TRACE (A) 09/26/2016 0245   BILIRUBINUR NEGATIVE 09/26/2016 0245   KETONESUR NEGATIVE 09/26/2016 0245   PROTEINUR NEGATIVE 09/26/2016 0245   UROBILINOGEN 0.2 06/01/2015 2320   NITRITE NEGATIVE 09/26/2016 0245   LEUKOCYTESUR NEGATIVE 09/26/2016 0245   Sepsis Labs: @LABRCNTIP (procalcitonin:4,lacticidven:4) )No results found for this or any previous visit (from the past 240 hour(s)).   Radiological Exams on Admission: Dg Chest 2 View  Result Date: 03/16/2017 CLINICAL DATA:  Chest pain EXAM: CHEST  2 VIEW COMPARISON:  July 11, 2015 FINDINGS: There is no edema or consolidation. The heart size and pulmonary vascularity are normal. No adenopathy. No bone lesions. IMPRESSION: No edema or consolidation. Electronically Signed   By: Lowella Grip III M.D.   On: 03/16/2017 07:57    EKG: Independently reviewed. Sinus with IRBB, nonspecific T wave change    Time spent:60 minutes Code Status:   FULL Family Communication:  Spouse updated at bedside Disposition Plan: expect 1-2 day hospitalization Consults called: cardiology DVT Prophylaxis:  Lovenox  Baelynn Schmuhl, DO  [facility] Pager [phone]  If 7PM-7AM, please contact night-coverage [email or web address] Password [email or web address] 03/16/2017, 9:07 AM

## 2017-03-16 NOTE — ED Provider Notes (Signed)
The patient is a 56 year old male, he has a known history of coronary disease status post stenting approximately a year and a half ago, currently taking Plavix and a baby aspirin. He was in his usual state of health last night when he noticed that around 9:00 at night he developed a left-sided chest pain which she describes as a sharp pain, nonradiating, associated with shortness of breath but no nausea, vomiting, diaphoresis or swelling of the lower extremities. He reports that this pain seemed to get better throughout the evening but when he woke up this morning it came back and was more intense. It does not get better with nitroglycerin, it seems to get worse with walking and deep breathing. He has no other risk factors for pulmonary embolism other than tobacco use. He still smokes one pack per day. He was told during his last heart catheterization that he had a 70% obstruction that did not meet criteria for stenting because it wasn't quite big enough.  On exam the patient is in no distress, he has clear lung sounds without wheezing, no murmurs, heart rate is controlled, pulse around 60, normal pulses at the radial arteries, no JVD or peripheral edema.  EKG performed 03/16/2017 at 7:01 AM shows normal sinus rhythm, normal axis, normal intervals, intraventricular conduction delay with a QRS of 115, normal ST segments, normal T waves, no signs of left ventricular hypertrophy, impression essentially normal EKG other than intraventricular conduction delay.  The patient will need a cardiac troponin, I anticipate that he will need to be admitted to the hospital for further evaluation given his ongoing symptoms. He has an established relationship with local cardiology, Bunnie Domino.  Medical screening examination/treatment/procedure(s) were conducted as a shared visit with non-physician practitioner(s) and myself.  I personally evaluated the patient during the encounter.  Clinical Impression:   Final  diagnoses:  Precordial chest pain         Noemi Chapel, MD 03/16/17 1517

## 2017-03-16 NOTE — ED Notes (Signed)
Pt ambulatory to bathroom.  Continue to have some chest pain.  Rates 3/10.

## 2017-03-16 NOTE — ED Notes (Signed)
Pt no distress.  Updated on pt status

## 2017-03-17 DIAGNOSIS — I25119 Atherosclerotic heart disease of native coronary artery with unspecified angina pectoris: Secondary | ICD-10-CM

## 2017-03-17 DIAGNOSIS — Z720 Tobacco use: Secondary | ICD-10-CM | POA: Diagnosis not present

## 2017-03-17 DIAGNOSIS — E782 Mixed hyperlipidemia: Secondary | ICD-10-CM | POA: Diagnosis not present

## 2017-03-17 DIAGNOSIS — I10 Essential (primary) hypertension: Secondary | ICD-10-CM | POA: Diagnosis not present

## 2017-03-17 DIAGNOSIS — E784 Other hyperlipidemia: Secondary | ICD-10-CM | POA: Diagnosis not present

## 2017-03-17 LAB — BASIC METABOLIC PANEL
ANION GAP: 6 (ref 5–15)
BUN: 23 mg/dL — ABNORMAL HIGH (ref 6–20)
CO2: 28 mmol/L (ref 22–32)
Calcium: 8.9 mg/dL (ref 8.9–10.3)
Chloride: 104 mmol/L (ref 101–111)
Creatinine, Ser: 0.83 mg/dL (ref 0.61–1.24)
GFR calc Af Amer: >60 mL/min (ref >60)
Glucose, Bld: 99 mg/dL (ref 65–99)
POTASSIUM: 4.2 mmol/L (ref 3.5–5.1)
SODIUM: 138 mmol/L (ref 135–145)

## 2017-03-17 LAB — HIV ANTIBODY (ROUTINE TESTING W REFLEX): HIV SCREEN 4TH GENERATION: NONREACTIVE

## 2017-03-17 MED ORDER — ISOSORBIDE MONONITRATE ER 30 MG PO TB24: 30.0000 mg | ORAL_TABLET | Freq: Every day | ORAL | 1 refills | Status: DC

## 2017-03-17 MED ORDER — ISOSORBIDE MONONITRATE ER 60 MG PO TB24
30.0000 mg | ORAL_TABLET | Freq: Every day | ORAL | Status: DC
  Administered 2017-03-17: 30 mg via ORAL
  Filled 2017-03-17: qty 1

## 2017-03-17 MED ORDER — ROSUVASTATIN CALCIUM 20 MG PO TABS: 40.0000 mg | ORAL_TABLET | Freq: Every day | ORAL | Status: DC

## 2017-03-17 MED ORDER — ROSUVASTATIN CALCIUM 40 MG PO TABS: 40.0000 mg | ORAL_TABLET | Freq: Every day | ORAL | 1 refills | Status: DC

## 2017-03-17 NOTE — Discharge Summary (Signed)
Physician Discharge Summary  Anthony Hensley MWU:[id number] DOB: Feb 16, 1961 DOA: 03/16/2017  PCP: Jake Samples, PA-C  Admit date: 03/16/2017 Discharge date: 03/17/2017  Admitted From: Home Disposition:  Home  Recommendations for Outpatient Follow-up:  1. Follow up with PCP in 1-2 weeks 2. Please obtain BMP/CBC in one week  Discharge Condition: Stable CODE STATUS: FULL Diet recommendation: Heart Healthy   Brief/Interim Summary: 56 y.o.malewith medical history of coronary artery disease status post PCI with DES to the mid circumflex on 07/13/2015, hypertension, hyperlipidemia, tobacco abuse presents with sharp substernal and left sided chest pain that began around 9 PM on 03/15/2017. The patient states that he began "feeling drained" on afternoon of 03/15/2017. He had some associated shortness of breath with chest pain without radiation.  He denied any nausea, vomiting, diaphoresis, dizziness, syncope. The patient went to bed, but he woke up with worsening chest pain. He took 2nitroglycerin at home without improvement. Fortunately, he continues to smoke 1 pack per day. He states that his chest discomfort is a little worse with movement and activity. The patient had a Myoview on 10/21/2016 which was low to intermediate probability with EF 60%. He is compliant with his aspirin and Plavix missing only one dose in the past month. On 07/13/15 he underwent a LHC by Dr. Angelena Form. Marland Kitchen He was found to have severe, 99% stenosis of the mid circumflex. He underwent successful PCI + DES to the mid circ. LVEF was normal at 65%. There was mild nonobstructive residual disease. Cardiology was consulted to assist with management.  Discharge Diagnoses:   Chest pain -The patient has typical and atypical components -It is partly reproducible with palpation -appreciate cardiology consult-->start long acting nitrates with close follow up -Cycle troponins--neg x 3 -10/21/2016 echo EF 60-55 percent, no WMA,  grade 1 DD -LDL 90  Hypertension -Continue lisinopril and metoprolol tartrate  Coronary artery disease -07/13/15--cath--severe, 99% stenosis of the mid circumflex. He underwent successful PCI + DES to the mid circ. LVEF was normal at 65%. There was mild nonobstructive residual disease. -Continue aspirin, Plavix, statin -start imdur 30 mg daily  Hyperlipidemia -LDL 90 -d/c atorvastatin -start rovustatin  Tobacco abuse  -Tobacco cessation discussed  -NicoDerm patch   GERD -Continue PPI   Discharge Instructions  Discharge Instructions    Diet - low sodium heart healthy    Complete by:  As directed    Increase activity slowly    Complete by:  As directed      Allergies as of 03/17/2017   No Known Allergies     Medication List    STOP taking these medications   atorvastatin 80 MG tablet Commonly known as:  LIPITOR   enalapril 10 MG tablet Commonly known as:  VASOTEC     TAKE these medications   aspirin 81 MG EC tablet Take 1 tablet (81 mg total) by mouth daily.   clopidogrel 75 MG tablet Commonly known as:  PLAVIX TAKE 1 TABLET BY MOUTH EVERY DAY WITH BREAKFAST   isosorbide mononitrate 30 MG 24 hr tablet Commonly known as:  IMDUR Take 1 tablet (30 mg total) by mouth daily.   lisinopril 2.5 MG tablet Commonly known as:  PRINIVIL,ZESTRIL TAKE 1 TABLET BY MOUTH daily   metoprolol tartrate 25 MG tablet Commonly known as:  LOPRESSOR Take 0.5 tablets (12.5 mg total) by mouth 2 (two) times daily.   NITROSTAT 0.4 MG SL tablet Generic drug:  nitroGLYCERIN Place 1 tablet under the tongue daily as needed for chest pain.  PROAIR HFA 108 (90 Base) MCG/ACT inhaler Generic drug:  albuterol Inhale 2 puffs into the lungs every 4 (four) hours as needed for shortness of breath.   albuterol (2.5 MG/3ML) 0.083% nebulizer solution Commonly known as:  PROVENTIL Take 3 mLs by nebulization daily as needed for shortness of breath.   RABEprazole 20 MG  tablet Commonly known as:  ACIPHEX Take 1 tablet (20 mg total) by mouth daily.   rosuvastatin 40 MG tablet Commonly known as:  CRESTOR Take 1 tablet (40 mg total) by mouth daily at 6 PM.   VISINE TEARS OP Apply 1 drop to eye daily as needed (dry eyes).      Follow-up Information    Herminio Commons, MD On 03/21/2017.   Specialty:  Cardiology Why:  at 2:00 pm Contact information: [place] [phone] [phone]          No Known Allergies  Consultations:  cardiology   Procedures/Studies: Dg Chest 2 View  Result Date: 03/16/2017 CLINICAL DATA:  Chest pain EXAM: CHEST  2 VIEW COMPARISON:  July 11, 2015 FINDINGS: There is no edema or consolidation. The heart size and pulmonary vascularity are normal. No adenopathy. No bone lesions. IMPRESSION: No edema or consolidation. Electronically Signed   By: Lowella Grip III M.D.   On: 03/16/2017 07:57         Discharge Exam: Vitals:   03/16/17 2218 03/17/17 0555  BP: 121/78 124/73  Pulse: (!) 59 60  Resp: 18 18  Temp: 97.7 F (36.5 C) 97.7 F (36.5 C)   Vitals:   03/16/17 1548 03/16/17 1623 03/16/17 2218 03/17/17 0555  BP: 115/71 131/74 121/78 124/73  Pulse: (!) 57 (!) 52 (!) 59 60  Resp: 15 20 18 18   Temp:  97.7 F (36.5 C) 97.7 F (36.5 C) 97.7 F (36.5 C)  TempSrc:  Oral Oral Oral  SpO2: 96% 98% 95% 98%  Weight:  74.1 kg (163 lb 4.8 oz)    Height:  5\' 6"  (1.676 m)      General: Pt is alert, awake, not in acute distress Cardiovascular: RRR, S1/S2 +, no rubs, no gallops Respiratory: CTA bilaterally, no wheezing, no rhonchi Abdominal: Soft, NT, ND, bowel sounds + Extremities: no edema, no cyanosis   The results of significant diagnostics from this hospitalization (including imaging, microbiology, ancillary and laboratory) are listed below for reference.    Significant Diagnostic Studies: Dg Chest 2 View  Result Date: 03/16/2017 CLINICAL DATA:  Chest pain EXAM: CHEST  2 VIEW  COMPARISON:  July 11, 2015 FINDINGS: There is no edema or consolidation. The heart size and pulmonary vascularity are normal. No adenopathy. No bone lesions. IMPRESSION: No edema or consolidation. Electronically Signed   By: Lowella Grip III M.D.   On: 03/16/2017 07:57     Microbiology: No results found for this or any previous visit (from the past 240 hour(s)).   Labs: Basic Metabolic Panel:  Recent Labs Lab 03/16/17 0719 03/17/17 0543  NA 138 138  K 4.0 4.2  CL 104 104  CO2 28 28  GLUCOSE 92 99  BUN 26* 23*  CREATININE 0.91 0.83  CALCIUM 9.1 8.9   Liver Function Tests: No results for input(s): AST, ALT, ALKPHOS, BILITOT, PROT, ALBUMIN in the last 168 hours. No results for input(s): LIPASE, AMYLASE in the last 168 hours. No results for input(s): AMMONIA in the last 168 hours. CBC:  Recent Labs Lab 03/16/17 0719  WBC 7.5  HGB 15.9  HCT 46.9  MCV 92.7  PLT 201   Cardiac Enzymes:  Recent Labs Lab 03/16/17 0719 03/16/17 1323 03/16/17 1830  TROPONINI <0.03 <0.03 <0.03   BNP: Invalid input(s): POCBNP CBG: No results for input(s): GLUCAP in the last 168 hours.  Time coordinating discharge:  Greater than 30 minutes  Signed:  Chilton Sallade, DO [facility] Pager: [phone] 03/17/2017, 11:02 AM

## 2017-03-17 NOTE — Progress Notes (Signed)
PROGRESS NOTE  Anthony Hensley [id number] DOB: 07/29/1961 DOA: 03/16/2017 PCP: Jake Samples, PA-C  Brief History:  56 y.o. male with medical history of coronary artery disease status post PCI with DES to the mid circumflex on 07/13/2015, hypertension, hyperlipidemia, tobacco abuse presents with sharp substernal and left sided chest pain that began around 9 PM on 03/15/2017. The patient states that he began "feeling drained" on afternoon of 03/15/2017. He had some associated shortness of breath with chest pain without radiation.   He denied any nausea, vomiting, diaphoresis, dizziness, syncope. The patient went to bed, but he woke up with worsening chest pain. He took 2 nitroglycerin at home without improvement. Fortunately, he continues to smoke 1 pack per day. He states that his chest discomfort is a little worse with movement and activity.  The patient had a Myoview on 10/21/2016 which was low to intermediate probability with EF 60%. He is compliant with his aspirin and Plavix missing only one dose in the past month. On 07/13/15 he underwent a LHC by Dr. Angelena Form. Marland Kitchen He was found to have severe, 99% stenosis of the mid circumflex. He underwent successful PCI + DES to the mid circ. LVEF was normal at 65%. There was mild nonobstructive residual disease. Cardiology was consulted to assist with management.  Assessment/Plan: Chest pain -The patient has typical and atypical components -It is partly reproducible with palpation -appreciate cardiology consult-->likely start long acting nitrates with close follow up vs cath -Cycle troponins--neg x 3 -10/21/2016 echo EF 60-55 percent, no WMA, grade 1 DD -LDL 90  Hypertension -Continue lisinopril and metoprolol tartrate  Coronary artery disease -07/13/15--cath--severe, 99% stenosis of the mid circumflex. He underwent successful PCI + DES to the mid circ. LVEF was normal at 65%. There was mild nonobstructive residual disease. -Continue  aspirin, Plavix, statin  Hyperlipidemia -LDL 90 -d/c atorvastatin -start rovustatin  Tobacco abuse  -Tobacco cessation discussed  -NicoDerm patch   GERD -Continue PPI    Disposition Plan:   Home when cleared by cardiology Family Communication:   Family at bedside  Consultants:  cardiology  Code Status:  FULL   DVT Prophylaxis:  Chance Lovenox   Procedures: As Listed in Progress Note Above  Antibiotics: None    Subjective: Patient continued to have intermittent substernal chest pain overnight. He has some associated shortness of breath, but denied any dizziness, diaphoresis, nausea, vomiting. Denies any fevers, chills, coughing, hemoptysis, abdominal pain, dysuria and hematuria. He states that his chest pain sharp in nature lasting about 5-6 minutes without any exacerbating or alleviating factors. He has been having some dyspnea on exertion  Objective: Vitals:   03/16/17 1548 03/16/17 1623 03/16/17 2218 03/17/17 0555  BP: 115/71 131/74 121/78 124/73  Pulse: (!) 57 (!) 52 (!) 59 60  Resp: 15 20 18 18   Temp:  97.7 F (36.5 C) 97.7 F (36.5 C) 97.7 F (36.5 C)  TempSrc:  Oral Oral Oral  SpO2: 96% 98% 95% 98%  Weight:  74.1 kg (163 lb 4.8 oz)    Height:  5\' 6"  (1.676 m)      Intake/Output Summary (Last 24 hours) at 03/17/17 0830 Last data filed at 03/16/17 1730  Gross per 24 hour  Intake              240 ml  Output                0 ml  Net  240 ml   Weight change:  Exam:   General:  Pt is alert, follows commands appropriately, not in acute distress  HEENT: No icterus, No thrush, No neck mass, [place]/AT  Cardiovascular: RRR, S1/S2, no rubs, no gallops  Respiratory: CTA bilaterally, no wheezing, no crackles, no rhonchi  Abdomen: Soft/+BS, non tender, non distended, no guarding  Extremities: No edema, No lymphangitis, No petechiae, No rashes, no synovitis   Data Reviewed: I have personally reviewed following labs and imaging studies Basic  Metabolic Panel:  Recent Labs Lab 03/16/17 0719 03/17/17 0543  NA 138 138  K 4.0 4.2  CL 104 104  CO2 28 28  GLUCOSE 92 99  BUN 26* 23*  CREATININE 0.91 0.83  CALCIUM 9.1 8.9   Liver Function Tests: No results for input(s): AST, ALT, ALKPHOS, BILITOT, PROT, ALBUMIN in the last 168 hours. No results for input(s): LIPASE, AMYLASE in the last 168 hours. No results for input(s): AMMONIA in the last 168 hours. Coagulation Profile: No results for input(s): INR, PROTIME in the last 168 hours. CBC:  Recent Labs Lab 03/16/17 0719  WBC 7.5  HGB 15.9  HCT 46.9  MCV 92.7  PLT 201   Cardiac Enzymes:  Recent Labs Lab 03/16/17 0719 03/16/17 1323 03/16/17 1830  TROPONINI <0.03 <0.03 <0.03   BNP: Invalid input(s): POCBNP CBG: No results for input(s): GLUCAP in the last 168 hours. HbA1C: No results for input(s): HGBA1C in the last 72 hours. Urine analysis:    Component Value Date/Time   COLORURINE YELLOW 09/26/2016 0245   APPEARANCEUR CLEAR 09/26/2016 0245   LABSPEC >1.030 (H) 09/26/2016 0245   PHURINE 5.5 09/26/2016 0245   GLUCOSEU NEGATIVE 09/26/2016 0245   HGBUR TRACE (A) 09/26/2016 0245   BILIRUBINUR NEGATIVE 09/26/2016 0245   KETONESUR NEGATIVE 09/26/2016 0245   PROTEINUR NEGATIVE 09/26/2016 0245   UROBILINOGEN 0.2 06/01/2015 2320   NITRITE NEGATIVE 09/26/2016 0245   LEUKOCYTESUR NEGATIVE 09/26/2016 0245   Sepsis Labs: @LABRCNTIP (procalcitonin:4,lacticidven:4) )No results found for this or any previous visit (from the past 240 hour(s)).   Scheduled Meds: . aspirin EC  81 mg Oral Daily  . clopidogrel  75 mg Oral Daily  . enoxaparin (LOVENOX) injection  40 mg Subcutaneous Q24H  . lisinopril  2.5 mg Oral Daily  . metoprolol tartrate  12.5 mg Oral BID  . nicotine  21 mg Transdermal Daily  . pantoprazole  40 mg Oral Daily  . rosuvastatin  40 mg Oral q1800   Continuous Infusions:  Procedures/Studies: Dg Chest 2 View  Result Date: 03/16/2017 CLINICAL DATA:   Chest pain EXAM: CHEST  2 VIEW COMPARISON:  July 11, 2015 FINDINGS: There is no edema or consolidation. The heart size and pulmonary vascularity are normal. No adenopathy. No bone lesions. IMPRESSION: No edema or consolidation. Electronically Signed   By: Lowella Grip III M.D.   On: 03/16/2017 07:57    Claretta Kendra, DO  [facility] Pager [phone]  If 7PM-7AM, please contact night-coverage [email or web address] Password [email or web address] 03/17/2017, 8:30 AM   LOS: 0 days

## 2017-03-17 NOTE — Progress Notes (Signed)
Progress Note  Patient Name: Anthony Hensley Date of Encounter: 03/17/2017  Primary Cardiologist: Dr. Kate Sable (seen 2016)  Subjective   Has had some recurrent brief episodes of chest discomfort, however generally feels better. No palpitations or shortness of breath.  Inpatient Medications    Scheduled Meds: . aspirin EC  81 mg Oral Daily  . clopidogrel  75 mg Oral Daily  . enoxaparin (LOVENOX) injection  40 mg Subcutaneous Q24H  . lisinopril  2.5 mg Oral Daily  . metoprolol tartrate  12.5 mg Oral BID  . nicotine  21 mg Transdermal Daily  . pantoprazole  40 mg Oral Daily  . rosuvastatin  40 mg Oral q1800    PRN Meds: acetaminophen **OR** acetaminophen, albuterol, ondansetron **OR** ondansetron (ZOFRAN) IV   Vital Signs    Vitals:   03/16/17 1548 03/16/17 1623 03/16/17 2218 03/17/17 0555  BP: 115/71 131/74 121/78 124/73  Pulse: (!) 57 (!) 52 (!) 59 60  Resp: 15 20 18 18   Temp:  97.7 F (36.5 C) 97.7 F (36.5 C) 97.7 F (36.5 C)  TempSrc:  Oral Oral Oral  SpO2: 96% 98% 95% 98%  Weight:  163 lb 4.8 oz (74.1 kg)    Height:  5\' 6"  (1.676 m)      Intake/Output Summary (Last 24 hours) at 03/17/17 1004 Last data filed at 03/17/17 0850  Gross per 24 hour  Intake              480 ml  Output                0 ml  Net              480 ml   Filed Weights   03/16/17 0703 03/16/17 1623  Weight: 155 lb (70.3 kg) 163 lb 4.8 oz (74.1 kg)    Telemetry    Sinus rhythm. Personally reviewed.  ECG    Tracing from 03/16/2017 showed sinus rhythm with R' in lead V1. Personally reviewed.  Physical Exam   GEN: Appears comfortable at rest.   Neck: No JVD. Cardiac: RRR, no murmur, rub, or gallop.  Respiratory: Nonlabored. Clear to auscultation bilaterally. GI: Soft, nontender, bowel sounds present. MS: No edema; No deformity.  Labs    Chemistry Recent Labs Lab 03/16/17 0719 03/17/17 0543  NA 138 138  K 4.0 4.2  CL 104 104  CO2 28 28  GLUCOSE 92 99  BUN 26*  23*  CREATININE 0.91 0.83  CALCIUM 9.1 8.9  GFRNONAA >60 >60  GFRAA >60 >60  ANIONGAP 6 6     Hematology Recent Labs Lab 03/16/17 0719  WBC 7.5  RBC 5.06  HGB 15.9  HCT 46.9  MCV 92.7  MCH 31.4  MCHC 33.9  RDW 12.7  PLT 201    Cardiac Enzymes Recent Labs Lab 03/16/17 0719 03/16/17 1323 03/16/17 1830  TROPONINI <0.03 <0.03 <0.03   No results for input(s): TROPIPOC in the last 168 hours.    Radiology    Dg Chest 2 View  Result Date: 03/16/2017 CLINICAL DATA:  Chest pain EXAM: CHEST  2 VIEW COMPARISON:  July 11, 2015 FINDINGS: There is no edema or consolidation. The heart size and pulmonary vascularity are normal. No adenopathy. No bone lesions. IMPRESSION: No edema or consolidation. Electronically Signed   By: Lowella Grip III M.D.   On: 03/16/2017 07:57    Cardiac Studies   Echocardiogram 10/21/2016: Study Conclusions  - Left ventricle: The cavity size was  normal. Wall thickness was   normal. Systolic function was normal. The estimated ejection   fraction was in the range of 60% to 65%. Wall motion was normal;   there were no regional wall motion abnormalities. Doppler   parameters are consistent with abnormal left ventricular   relaxation (grade 1 diastolic dysfunction). - Aortic valve: There was mild regurgitation.  Exercise Myoview 10/21/2016:  Blood pressure demonstrated a normal response to exercise.  There was no ST segment deviation noted during stress.  Defect 1: There is a medium defect of moderate severity present in the basal inferoseptal, basal inferior, mid inferoseptal, mid inferior and apical inferior location.  Findings consistent with probable prior myocardial infarction with peri-infarct ischemia.  This is a low to intermediate risk study.  Nuclear stress EF: 60%.  Patient Profile     56 y.o. male with a history of CAD status post DES to the mid circumflex extending into the obtuse marginal system in the setting of NSTEMI in  October 2016, hypertension, hyperlipidemia, and ongoing tobacco abuse. He presents with recurring chest discomfort on medical therapy, troponin I levels are completely normal and ECG shows no acute ST segment changes. He underwent a Myoview study earlier this year which showed a combination of scar with peri-infarct ischemia in the inferior distribution with normal LVEF.  Assessment & Plan    1. Recurrent chest pain with typical and atypical features for angina. No evidence of ACS by troponin I levels and ECG without acute ST segment changes. Patient reports compliance with his medications.  2. CAD status post DES to the mid circumflex extending into the obtuse marginal system in October 2016. Myoview study from January of this year showed combination of scar with peri-infarct ischemia in the inferior distribution and normal LVEF.  3. Ongoing tobacco abuse. Smoking cessation discussed.  4. Essential hypertension, blood pressure control is adequate.  5. Hyperlipidemia, LDL 90 on Lipitor 80 mg daily as an outpatient, currently on Crestor 40 mg daily as an inpatient.  Discussed with patient and family member in room. Would plan to continue aspirin, Plavix, lisinopril, Lopressor, Crestor, and as needed nitroglycerin. Add Imdur 30 mg daily for additional antianginal benefit. We will schedule an office follow-up visit for next week, if symptoms continue may need to consider an elective cardiac catheterization to assess for any revascularization options. Patient in agreement.  Signed, Rozann Lesches, MD  03/17/2017, 10:04 AM

## 2017-03-17 NOTE — Progress Notes (Signed)
Patient is to be discharged home and in stable condition. IV and telemetry removed. Patient educated on the need for follow-up appointments, new medication regimen and the need to stop smoking. Patient and wife verbalized understanding and is requesting to walk out, refusing a wheelchair escort.  Celestia Khat, RN

## 2017-03-18 LAB — HEMOGLOBIN A1C
HEMOGLOBIN A1C: 5.5 % (ref 4.8–5.6)
MEAN PLASMA GLUCOSE: 111 mg/dL

## 2017-03-20 ENCOUNTER — Encounter (HOSPITAL_COMMUNITY): Payer: Self-pay | Admitting: Emergency Medicine

## 2017-03-20 ENCOUNTER — Encounter (HOSPITAL_COMMUNITY)
Admission: EM | Disposition: A | Discharge status: Home / Self Care | Payer: Self-pay | Source: Home / Self Care | Attending: Emergency Medicine

## 2017-03-20 ENCOUNTER — Observation Stay (HOSPITAL_COMMUNITY)
Admission: EM | Admit: 2017-03-20 | Discharge: 2017-03-22 | Disposition: A | Discharge status: Home / Self Care | Payer: Commercial Managed Care - PPO | Attending: Cardiovascular Disease | Admitting: Cardiovascular Disease

## 2017-03-20 ENCOUNTER — Emergency Department (HOSPITAL_COMMUNITY): Payer: Commercial Managed Care - PPO

## 2017-03-20 DIAGNOSIS — K219 Gastro-esophageal reflux disease without esophagitis: Secondary | ICD-10-CM | POA: Insufficient documentation

## 2017-03-20 DIAGNOSIS — E782 Mixed hyperlipidemia: Secondary | ICD-10-CM

## 2017-03-20 DIAGNOSIS — F17210 Nicotine dependence, cigarettes, uncomplicated: Secondary | ICD-10-CM | POA: Diagnosis not present

## 2017-03-20 DIAGNOSIS — Z7902 Long term (current) use of antithrombotics/antiplatelets: Secondary | ICD-10-CM | POA: Insufficient documentation

## 2017-03-20 DIAGNOSIS — K5790 Diverticulosis of intestine, part unspecified, without perforation or abscess without bleeding: Secondary | ICD-10-CM | POA: Insufficient documentation

## 2017-03-20 DIAGNOSIS — Z720 Tobacco use: Secondary | ICD-10-CM | POA: Diagnosis not present

## 2017-03-20 DIAGNOSIS — I10 Essential (primary) hypertension: Secondary | ICD-10-CM

## 2017-03-20 DIAGNOSIS — I2510 Atherosclerotic heart disease of native coronary artery without angina pectoris: Secondary | ICD-10-CM | POA: Diagnosis present

## 2017-03-20 DIAGNOSIS — I25110 Atherosclerotic heart disease of native coronary artery with unstable angina pectoris: Principal | ICD-10-CM | POA: Insufficient documentation

## 2017-03-20 DIAGNOSIS — I252 Old myocardial infarction: Secondary | ICD-10-CM | POA: Insufficient documentation

## 2017-03-20 DIAGNOSIS — I25119 Atherosclerotic heart disease of native coronary artery with unspecified angina pectoris: Secondary | ICD-10-CM | POA: Diagnosis present

## 2017-03-20 DIAGNOSIS — Z7982 Long term (current) use of aspirin: Secondary | ICD-10-CM | POA: Insufficient documentation

## 2017-03-20 DIAGNOSIS — Z955 Presence of coronary angioplasty implant and graft: Secondary | ICD-10-CM | POA: Insufficient documentation

## 2017-03-20 DIAGNOSIS — Z8249 Family history of ischemic heart disease and other diseases of the circulatory system: Secondary | ICD-10-CM | POA: Diagnosis not present

## 2017-03-20 DIAGNOSIS — I4510 Unspecified right bundle-branch block: Secondary | ICD-10-CM | POA: Insufficient documentation

## 2017-03-20 DIAGNOSIS — M1990 Unspecified osteoarthritis, unspecified site: Secondary | ICD-10-CM | POA: Diagnosis not present

## 2017-03-20 DIAGNOSIS — E7800 Pure hypercholesterolemia, unspecified: Secondary | ICD-10-CM | POA: Diagnosis not present

## 2017-03-20 DIAGNOSIS — I200 Unstable angina: Secondary | ICD-10-CM

## 2017-03-20 HISTORY — PX: LEFT HEART CATH AND CORONARY ANGIOGRAPHY: CATH118249

## 2017-03-20 HISTORY — DX: Dyspnea, unspecified: R06.00

## 2017-03-20 HISTORY — DX: Gastro-esophageal reflux disease without esophagitis: K21.9

## 2017-03-20 HISTORY — DX: Unspecified osteoarthritis, unspecified site: M19.90

## 2017-03-20 HISTORY — DX: Angina pectoris, unspecified: I20.9

## 2017-03-20 LAB — BASIC METABOLIC PANEL
ANION GAP: 6 (ref 5–15)
Anion gap: 6 (ref 5–15)
BUN: 20 mg/dL (ref 6–20)
BUN: 14 mg/dL (ref 6–20)
CHLORIDE: 104 mmol/L (ref 101–111)
CHLORIDE: 108 mmol/L (ref 101–111)
CO2: 30 mmol/L (ref 22–32)
CO2: 25 mmol/L (ref 22–32)
CREATININE: 0.79 mg/dL (ref 0.61–1.24)
Calcium: 9.1 mg/dL (ref 8.9–10.3)
Calcium: 8.6 mg/dL — ABNORMAL LOW (ref 8.9–10.3)
Creatinine, Ser: 0.88 mg/dL (ref 0.61–1.24)
GFR calc Af Amer: >60 mL/min (ref >60)
GFR calc Af Amer: >60 mL/min (ref >60)
GFR calc non Af Amer: >60 mL/min (ref >60)
GLUCOSE: 97 mg/dL (ref 65–99)
Glucose, Bld: 80 mg/dL (ref 65–99)
POTASSIUM: 4.3 mmol/L (ref 3.5–5.1)
Potassium: 3.7 mmol/L (ref 3.5–5.1)
SODIUM: 140 mmol/L (ref 135–145)
SODIUM: 139 mmol/L (ref 135–145)

## 2017-03-20 LAB — CBC WITH DIFFERENTIAL/PLATELET
BASOS ABS: 0 10*3/uL (ref 0.0–0.1)
BASOS PCT: 0 %
Eosinophils Absolute: 0.1 10*3/uL (ref 0.0–0.7)
Eosinophils Relative: 2 %
HEMATOCRIT: 46.2 % (ref 39.0–52.0)
Hemoglobin: 15.3 g/dL (ref 13.0–17.0)
LYMPHS PCT: 32 %
Lymphs Abs: 1.9 10*3/uL (ref 0.7–4.0)
MCH: 30.9 pg (ref 26.0–34.0)
MCHC: 33.1 g/dL (ref 30.0–36.0)
MCV: 93.3 fL (ref 78.0–100.0)
MONO ABS: 0.3 10*3/uL (ref 0.1–1.0)
Monocytes Relative: 6 %
NEUTROS ABS: 3.4 10*3/uL (ref 1.7–7.7)
Neutrophils Relative %: 60 %
PLATELETS: 207 10*3/uL (ref 150–400)
RBC: 4.95 MIL/uL (ref 4.22–5.81)
RDW: 12.6 % (ref 11.5–15.5)
WBC: 5.8 10*3/uL (ref 4.0–10.5)

## 2017-03-20 LAB — TROPONIN I

## 2017-03-20 LAB — LIPID PANEL
Cholesterol: 126 mg/dL (ref 0–200)
HDL: 43 mg/dL (ref >40)
LDL Cholesterol: 65 mg/dL (ref 0–99)
TRIGLYCERIDES: 91 mg/dL (ref <150)
Total CHOL/HDL Ratio: 2.9 RATIO
VLDL: 18 mg/dL (ref 0–40)

## 2017-03-20 LAB — PROTIME-INR
INR: 0.98
Prothrombin Time: 13 seconds (ref 11.4–15.2)

## 2017-03-20 LAB — APTT: APTT: 25 s (ref 24–36)

## 2017-03-20 LAB — MRSA PCR SCREENING: MRSA by PCR: NEGATIVE

## 2017-03-20 SURGERY — LEFT HEART CATH AND CORONARY ANGIOGRAPHY
Anesthesia: LOCAL

## 2017-03-20 MED ORDER — MIDAZOLAM HCL 2 MG/2ML IJ SOLN
INTRAMUSCULAR | Status: DC | PRN
  Administered 2017-03-20: 1 mg via INTRAVENOUS

## 2017-03-20 MED ORDER — ACETAMINOPHEN 325 MG PO TABS: 650.0000 mg | ORAL_TABLET | ORAL | Status: DC | PRN

## 2017-03-20 MED ORDER — IOPAMIDOL (ISOVUE-370) INJECTION 76%
INTRAVENOUS | Status: AC
Start: 2017-03-20 — End: 2017-03-20
  Filled 2017-03-20: qty 100

## 2017-03-20 MED ORDER — NICOTINE 14 MG/24HR TD PT24
14.0000 mg | MEDICATED_PATCH | Freq: Every day | TRANSDERMAL | Status: DC
  Administered 2017-03-20 – 2017-03-21 (×2): 14 mg via TRANSDERMAL
  Filled 2017-03-20 (×2): qty 1

## 2017-03-20 MED ORDER — SODIUM CHLORIDE 0.9 % WEIGHT BASED INFUSION: 1.0000 mL/kg/h | INTRAVENOUS | Status: AC

## 2017-03-20 MED ORDER — ONDANSETRON HCL 4 MG/2ML IJ SOLN: 4.0000 mg | Freq: Four times a day (QID) | INTRAMUSCULAR | Status: DC | PRN

## 2017-03-20 MED ORDER — NITROGLYCERIN IN D5W 200-5 MCG/ML-% IV SOLN
5.0000 ug/min | Freq: Once | INTRAVENOUS | Status: AC
  Administered 2017-03-20: 5 ug/min via INTRAVENOUS
  Filled 2017-03-20: qty 250

## 2017-03-20 MED ORDER — FENTANYL CITRATE (PF) 100 MCG/2ML IJ SOLN
INTRAMUSCULAR | Status: DC | PRN
  Administered 2017-03-20: 25 ug via INTRAVENOUS

## 2017-03-20 MED ORDER — ASPIRIN EC 81 MG PO TBEC
81.0000 mg | DELAYED_RELEASE_TABLET | Freq: Every day | ORAL | Status: DC
  Administered 2017-03-21 – 2017-03-22 (×2): 81 mg via ORAL
  Filled 2017-03-20 (×2): qty 1

## 2017-03-20 MED ORDER — HEPARIN SODIUM (PORCINE) 1000 UNIT/ML IJ SOLN
INTRAMUSCULAR | Status: DC | PRN
  Administered 2017-03-20: 4000 [IU] via INTRAVENOUS

## 2017-03-20 MED ORDER — HEPARIN (PORCINE) IN NACL 100-0.45 UNIT/ML-% IJ SOLN
900.0000 [IU]/h | INTRAMUSCULAR | Status: DC
  Administered 2017-03-20: 900 [IU]/h via INTRAVENOUS
  Filled 2017-03-20: qty 250

## 2017-03-20 MED ORDER — SODIUM CHLORIDE 0.9 % WEIGHT BASED INFUSION: 1.0000 mL/kg/h | INTRAVENOUS | Status: DC

## 2017-03-20 MED ORDER — SODIUM CHLORIDE 0.9 % IV SOLN: 250.0000 mL | INTRAVENOUS | Status: DC | PRN

## 2017-03-20 MED ORDER — SODIUM CHLORIDE 0.9% FLUSH
3.0000 mL | INTRAVENOUS | Status: DC | PRN
  Administered 2017-03-22: 3 mL via INTRAVENOUS
  Filled 2017-03-20: qty 3

## 2017-03-20 MED ORDER — SODIUM CHLORIDE 0.9% FLUSH: 3.0000 mL | Freq: Two times a day (BID) | INTRAVENOUS | Status: DC

## 2017-03-20 MED ORDER — NITROGLYCERIN 0.4 MG SL SUBL
0.4000 mg | SUBLINGUAL_TABLET | SUBLINGUAL | Status: DC | PRN
  Administered 2017-03-21 (×3): 0.4 mg via SUBLINGUAL
  Filled 2017-03-20: qty 1

## 2017-03-20 MED ORDER — VERAPAMIL HCL 2.5 MG/ML IV SOLN
INTRAVENOUS | Status: AC
  Filled 2017-03-20: qty 2

## 2017-03-20 MED ORDER — VERAPAMIL HCL 2.5 MG/ML IV SOLN
INTRAVENOUS | Status: DC | PRN
  Administered 2017-03-20: 10 mL via INTRA_ARTERIAL

## 2017-03-20 MED ORDER — ONDANSETRON HCL 4 MG/2ML IJ SOLN
4.0000 mg | Freq: Four times a day (QID) | INTRAMUSCULAR | Status: DC | PRN
Start: 2017-03-20 — End: 2017-03-20

## 2017-03-20 MED ORDER — HEPARIN BOLUS VIA INFUSION
4000.0000 [IU] | Freq: Once | INTRAVENOUS | Status: AC
  Administered 2017-03-20: 4000 [IU] via INTRAVENOUS

## 2017-03-20 MED ORDER — GI COCKTAIL ~~LOC~~
30.0000 mL | Freq: Once | ORAL | Status: AC
Start: 2017-03-20 — End: 2017-03-20
  Administered 2017-03-20: 30 mL via ORAL
  Filled 2017-03-20: qty 30

## 2017-03-20 MED ORDER — NITROGLYCERIN 0.4 MG SL SUBL: 0.4000 mg | SUBLINGUAL_TABLET | SUBLINGUAL | Status: DC | PRN

## 2017-03-20 MED ORDER — LIDOCAINE HCL (PF) 1 % IJ SOLN
INTRAMUSCULAR | Status: AC
  Filled 2017-03-20: qty 30

## 2017-03-20 MED ORDER — LIDOCAINE HCL (PF) 1 % IJ SOLN
INTRAMUSCULAR | Status: DC | PRN
  Administered 2017-03-20: 2 mL via SUBCUTANEOUS

## 2017-03-20 MED ORDER — ASPIRIN 81 MG PO CHEW
243.0000 mg | CHEWABLE_TABLET | Freq: Once | ORAL | Status: AC
  Administered 2017-03-20: 243 mg via ORAL
  Filled 2017-03-20: qty 3

## 2017-03-20 MED ORDER — SODIUM CHLORIDE 0.9 % IV SOLN
INTRAVENOUS | Status: DC
  Administered 2017-03-20: 08:00:00 via INTRAVENOUS

## 2017-03-20 MED ORDER — SODIUM CHLORIDE 0.9 % WEIGHT BASED INFUSION
3.0000 mL/kg/h | INTRAVENOUS | Status: DC
  Administered 2017-03-20: 3 mL/kg/h via INTRAVENOUS

## 2017-03-20 MED ORDER — SODIUM CHLORIDE 0.9% FLUSH
3.0000 mL | Freq: Two times a day (BID) | INTRAVENOUS | Status: DC
  Administered 2017-03-21 – 2017-03-22 (×3): 3 mL via INTRAVENOUS

## 2017-03-20 MED ORDER — MORPHINE SULFATE (PF) 4 MG/ML IV SOLN
4.0000 mg | Freq: Once | INTRAVENOUS | Status: AC
  Administered 2017-03-20: 4 mg via INTRAVENOUS
  Filled 2017-03-20: qty 1

## 2017-03-20 MED ORDER — MIDAZOLAM HCL 2 MG/2ML IJ SOLN
INTRAMUSCULAR | Status: AC
  Filled 2017-03-20: qty 2

## 2017-03-20 MED ORDER — ASPIRIN EC 81 MG PO TBEC: 81.0000 mg | DELAYED_RELEASE_TABLET | Freq: Every day | ORAL | Status: DC

## 2017-03-20 MED ORDER — ASPIRIN 81 MG PO CHEW: 81.0000 mg | CHEWABLE_TABLET | ORAL | Status: AC

## 2017-03-20 MED ORDER — ASPIRIN 300 MG RE SUPP: 300.0000 mg | RECTAL | Status: DC

## 2017-03-20 MED ORDER — HEPARIN SODIUM (PORCINE) 1000 UNIT/ML IJ SOLN
INTRAMUSCULAR | Status: AC
  Filled 2017-03-20: qty 1

## 2017-03-20 MED ORDER — HEPARIN (PORCINE) IN NACL 2-0.9 UNIT/ML-% IJ SOLN
INTRAMUSCULAR | Status: AC
  Filled 2017-03-20: qty 1000

## 2017-03-20 MED ORDER — SODIUM CHLORIDE 0.9% FLUSH: 3.0000 mL | INTRAVENOUS | Status: DC | PRN

## 2017-03-20 MED ORDER — ASPIRIN 81 MG PO CHEW: 324.0000 mg | CHEWABLE_TABLET | ORAL | Status: DC

## 2017-03-20 MED ORDER — NITROGLYCERIN 0.4 MG SL SUBL
0.4000 mg | SUBLINGUAL_TABLET | SUBLINGUAL | Status: DC | PRN
  Administered 2017-03-20: 0.4 mg via SUBLINGUAL
  Filled 2017-03-20: qty 1

## 2017-03-20 MED ORDER — HEPARIN (PORCINE) IN NACL 2-0.9 UNIT/ML-% IJ SOLN
INTRAMUSCULAR | Status: AC | PRN
  Administered 2017-03-20: 1000 mL

## 2017-03-20 MED ORDER — FENTANYL CITRATE (PF) 100 MCG/2ML IJ SOLN
INTRAMUSCULAR | Status: AC
Start: 2017-03-20 — End: 2017-03-20
  Filled 2017-03-20: qty 2

## 2017-03-20 MED ORDER — SODIUM CHLORIDE 0.9 % WEIGHT BASED INFUSION: 3.0000 mL/kg/h | INTRAVENOUS | Status: DC

## 2017-03-20 SURGICAL SUPPLY — 12 items
CATH INFINITI 5 FR JL3.5 (CATHETERS) ×2 IMPLANT
CATH INFINITI 5FR ANG PIGTAIL (CATHETERS) ×2 IMPLANT
CATH INFINITI JR4 5F (CATHETERS) ×2 IMPLANT
DEVICE RAD COMP TR BAND LRG (VASCULAR PRODUCTS) ×2 IMPLANT
GLIDESHEATH SLEND SS 6F .021 (SHEATH) ×1 IMPLANT
GUIDEWIRE INQWIRE 1.5J.035X260 (WIRE) ×1 IMPLANT
INQWIRE 1.5J .035X260CM (WIRE) ×2
KIT HEART LEFT (KITS) ×2 IMPLANT
PACK CARDIAC CATHETERIZATION (CUSTOM PROCEDURE TRAY) ×2 IMPLANT
SYR MEDRAD MARK V 150ML (SYRINGE) ×2 IMPLANT
TRANSDUCER W/STOPCOCK (MISCELLANEOUS) ×2 IMPLANT
TUBING CIL FLEX 10 FLL-RA (TUBING) ×2 IMPLANT

## 2017-03-20 NOTE — Progress Notes (Signed)
ANTICOAGULATION CONSULT NOTE - Initial Consult  Pharmacy Consult for Heparin Indication: chest pain/ACS  No Known Allergies  Patient Measurements: Height: 5\' 6"  (167.6 cm) Weight: 163 lb (73.9 kg) IBW/kg (Calculated) : 63.8 HEPARIN DW (KG): 73.9  Vital Signs: Temp: 98.4 F (36.9 C) (06/11 0737) Temp Source: Oral (06/11 0737) BP: 118/89 (06/11 0800) Pulse Rate: 61 (06/11 0800)  Labs:  Recent Labs  03/20/17 0731  HGB 15.3  HCT 46.2  PLT 207  APTT 25  LABPROT 13.0  INR 0.98  CREATININE 0.88  TROPONINI <0.03    Estimated Creatinine Clearance: 87.6 mL/min (by C-G formula based on SCr of 0.88 mg/dL).   Medical History: Past Medical History:  Diagnosis Date  . CAD (coronary artery disease)    DES to mid circumflex October 2016  . Diverticulosis   . Hypercholesterolemia   . Hypertension   . NSTEMI (non-ST elevated myocardial infarction) [facility])    October 2016    Medications:  See med rec  Assessment: 56 yo male presented to ED with recurring chest pain. He was discharged on Friday but the SOB returned Sunday. Initial troponin is nl. Anticoagulation to be started for chest pain.   Goal of Therapy:  Heparin level 0.3-0.7 units/ml Monitor platelets by anticoagulation protocol: Yes   Plan:  Give 4000 units bolus x 1 Start heparin infusion at 900 units/hr Check anti-Xa level in 6 hours and daily while on heparin Continue to monitor H&H and platelets  Isac Sarna, BS Vena [place], BCPS Clinical Pharmacist Pager [phone] 03/20/2017,8:42 AM

## 2017-03-20 NOTE — ED Provider Notes (Signed)
[facility] DEPT Provider Note   CSN: [id number] Arrival date & time: 03/20/17  0714     History   Chief Complaint Chief Complaint  Patient presents with  . Chest Pain    HPI Anthony Hensley is a 56 y.o. male.  HPI Patient presents with recurrent chest pain. Recent admission to hospital and discharged 3 days ago after admission. Reviewing the records she was started on Imdur and the plan was if the chest pain returned she would need a heart catheterization. States since then he has been having chest pain. States it began again yesterday. It is both dull and sharp in his anterior chest. Took a nitroglycerin yesterday without that the pain but has not taken it today. Some what worse with walking. No fevers. No cough. He continues to smoke. He had a baby aspirin this morning but is not taking any sublingual nitroglycerin.   Past Medical History:  Diagnosis Date  . Anginal pain ([place])   . Arthritis    " IN MY NECK & SHOULDERS "  . CAD (coronary artery disease)    DES to mid circumflex October 2016  . Diverticulosis   . Dyspnea     AT TIMES"  . GERD (gastroesophageal reflux disease)   . Hypercholesterolemia   . Hypertension   . NSTEMI (non-ST elevated myocardial infarction) [facility])    October 2016    Patient Active Problem List   Diagnosis Date Noted  . Coronary artery disease involving native heart with angina pectoris ([place]) 03/20/2017  . CAD (coronary artery disease) 03/20/2017  . Unstable angina ([place])   . Coronary artery disease involving native coronary artery of native heart with angina pectoris ([place]) 03/17/2017  . 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 Precordial chest pain   . Diarrhea 09/26/2016  . Enteritis 09/26/2016  . GERD (gastroesophageal reflux disease) 06/08/2016  . Constipation 06/08/2016  . Mucosal abnormality of stomach   . Mucosal abnormality of esophagus   . History of colonic polyps    . Diverticulosis of colon without hemorrhage   . Dysphagia 11/27/2015  . Rectal bleeding 11/27/2015  . Abdominal pain 11/27/2015  . Tobacco abuse 07/27/2015  . Chest pain at rest   . NSTEMI (non-ST elevated myocardial infarction) ([place])   . ACS (acute coronary syndrome) ([place]) 07/12/2015  . Hyperlipidemia 07/12/2015  . Chest pain 07/11/2015  . Essential hypertension 07/11/2015  . LUMBAR SPRAIN AND STRAIN 06/08/2009    Past Surgical History:  Procedure Laterality Date  . CARDIAC CATHETERIZATION N/A 07/13/2015   Procedure: Left Heart Cath and Coronary Angiography;  Surgeon: Burnell Blanks, MD;  Location: [facility] CV LAB;  Service: Cardiovascular;  Laterality: N/A;  . CARDIAC CATHETERIZATION N/A 07/13/2015   PCI + DES to the mid circ. LVEF was normal at 65%  . COLONOSCOPY N/A 12/31/2015   Dr.Rourk- diverticulosis,49mm polyp in the splenic flexure, 33mm polyp in the splenic flexure, 58mm polyp in the sigmoid colon bx= traditional serrated adenoma  . ESOPHAGOGASTRODUODENOSCOPY N/A 12/31/2015   Dr.Rourk- esophagitis with no bleeding, diffuse moderately erythematous mucosa without bleeding was found in the entire examined stomach. stomach bx= slight chronic inflammation. esophagus bx= benign gastresophageal junction mucosa  . MALONEY DILATION N/A 12/31/2015   Procedure: Venia Minks DILATION;  Surgeon: [name], MD;  Location: [facility];  Service: Endoscopy;  Laterality: N/A;       Home Medications    Prior to Admission medications   Medication  Sig Start Date End Date Taking? Authorizing Provider  albuterol (PROVENTIL) (2.5 MG/3ML) 0.083% nebulizer solution Take 3 mLs by nebulization daily as needed for shortness of breath. 08/04/16   [provider]  aspirin 81 MG EC tablet Take 1 tablet (81 mg total) by mouth daily. 07/14/15   Lyda Jester M, PA-C  atorvastatin (LIPITOR) 80 MG tablet Take 1 tablet by mouth daily. 03/02/17   [provider]  clopidogrel  (PLAVIX) 75 MG tablet TAKE 1 TABLET BY MOUTH EVERY DAY WITH BREAKFAST 03/02/17   [name], NP  Glycerin-Hypromellose-PEG 400 (VISINE TEARS OP) Apply 1 drop to eye daily as needed (dry eyes).     [provider]  isosorbide mononitrate (IMDUR) 30 MG 24 hr tablet Take 1 tablet (30 mg total) by mouth daily. 03/17/17   Orson Eva, MD  lisinopril (PRINIVIL,ZESTRIL) 2.5 MG tablet TAKE 1 TABLET BY MOUTH daily 02/06/17   [name], NP  metoprolol tartrate (LOPRESSOR) 25 MG tablet Take 0.5 tablets (12.5 mg total) by mouth 2 (two) times daily. 07/19/16   [name], NP  NITROSTAT 0.4 MG SL tablet Place 1 tablet under the tongue daily as needed for chest pain. 07/02/16   [provider]  PROAIR HFA 108 (90 Base) MCG/ACT inhaler Inhale 2 puffs into the lungs every 4 (four) hours as needed for shortness of breath. 08/01/16   [provider]  RABEprazole (ACIPHEX) 20 MG tablet Take 1 tablet (20 mg total) by mouth daily. 11/24/16   [name], NP  rosuvastatin (CRESTOR) 40 MG tablet Take 1 tablet (40 mg total) by mouth daily at 6 PM. 03/17/17   Orson Eva, MD    Family History Family History  Problem Relation Age of Onset  . Heart attack Father 21       Deceased  . Colon cancer Neg Hx     Social History Social History  Substance Use Topics  . Smoking status: Current Some Day Smoker    Packs/day: 0.75    Years: 35.00    Types: Cigarettes  . Smokeless tobacco: Never Used  . Alcohol use No     Allergies   Patient has no known allergies.   Review of Systems Review of Systems  Constitutional: Negative for appetite change and fever.  Respiratory: Positive for shortness of breath.   Cardiovascular: Positive for chest pain.  Gastrointestinal: Negative for abdominal pain.  Genitourinary: Negative for flank pain.  Musculoskeletal: Negative for back pain.  Skin: Negative for rash.  Neurological: Negative for headaches.  Hematological: Negative for  adenopathy.  Psychiatric/Behavioral: Negative for confusion. The patient is not hyperactive.      Physical Exam Updated Vital Signs BP 107/74   Pulse (!) 0   Temp 97.9 F (36.6 C) (Oral)   Resp (!) 0   Ht 5\' 6"  (1.676 m)   Wt 73.9 kg (163 lb)   SpO2 (!) 0%   BMI 26.31 kg/m   Physical Exam  Constitutional: He appears well-developed.  HENT:  Head: Atraumatic.  Eyes: EOM are normal.  Neck: Neck supple.  Cardiovascular: Normal rate.   Pulmonary/Chest: Effort normal.  Abdominal: There is no tenderness.  Musculoskeletal: He exhibits no edema.  Neurological: He is alert.  Skin: Skin is warm. Capillary refill takes less than 2 seconds.  Psychiatric: He has a normal mood and affect.     ED Treatments / Results  Labs (all labs ordered are listed, but only abnormal results are displayed) Labs Reviewed  BASIC METABOLIC PANEL - Abnormal; Notable for the following:       Result Value   Calcium 8.6 (*)    All other components within normal limits  MRSA PCR SCREENING  CBC WITH DIFFERENTIAL/PLATELET  PROTIME-INR  APTT  TROPONIN I  LIPID PANEL  BASIC METABOLIC PANEL    EKG  EKG Interpretation  Date/Time:  Monday March 20 2017 07:25:02 EDT Ventricular Rate:  65 PR Interval:    QRS Duration: 114 QT Interval:  400 QTC Calculation: 416 R Axis:   89 Text Interpretation:  Sinus rhythm Incomplete right bundle branch block No significant change since last tracing Confirmed by Alvino Chapel  MD, Ovid Curd [id number]) on 03/20/2017 7:30:22 AM       Radiology Dg Chest 2 View  Result Date: 03/20/2017 CLINICAL DATA:  Chest pain since yesterday morning.  Tobacco use. EXAM: CHEST  2 VIEW COMPARISON:  03/16/2017 FINDINGS: The lungs are clear without airspace disease or pulmonary edema. Heart and mediastinum are within normal limits. The trachea is midline. No large pleural effusions. No acute bone abnormality. IMPRESSION: No active cardiopulmonary disease. Electronically Signed   By: Markus Daft  M.D.   On: 03/20/2017 08:19    Procedures Procedures (including critical care time)  Medications Ordered in ED Medications  0.9 %  sodium chloride infusion ( Intravenous New Bag/Given 03/20/17 0740)  sodium chloride flush (NS) 0.9 % injection 3 mL (3 mLs Intravenous Not Given 03/20/17 1245)  sodium chloride flush (NS) 0.9 % injection 3 mL (not administered)  0.9 %  sodium chloride infusion (not administered)  aspirin chewable tablet 324 mg ( Oral MAR Hold 03/20/17 1425)  aspirin EC tablet 81 mg ( Oral Automatically Held 03/29/17 1000)  nitroGLYCERIN (NITROSTAT) SL tablet 0.4 mg ( Sublingual MAR Hold 03/20/17 1425)  ondansetron (ZOFRAN) injection 4 mg ( Intravenous MAR Hold 03/20/17 1425)  0.9% sodium chloride infusion (3 mL/kg/hr  73.9 kg Intravenous New Bag/Given 03/20/17 1339)    Followed by  0.9% sodium chloride infusion (1 mL/kg/hr  73.9 kg Intravenous Rate/Dose Change 03/20/17 1345)  aspirin chewable tablet 243 mg (243 mg Oral Given 03/20/17 0737)  gi cocktail (Maalox,Lidocaine,Donnatal) (30 mLs Oral Given 03/20/17 0815)  nitroGLYCERIN 50 mg in dextrose 5 % 250 mL (0.2 mg/mL) infusion (5 mcg/min Intravenous New Bag/Given 03/20/17 0848)  aspirin chewable tablet 81 mg (0 mg Oral Duplicate 01/01/39 1027)  heparin bolus via infusion 4,000 Units (4,000 Units Intravenous Bolus from Bag 03/20/17 0904)  morphine 4 MG/ML injection 4 mg (4 mg Intravenous Given 03/20/17 1113)  heparin infusion 2 units/mL in 0.9 % sodium chloride (1,000 mLs Other Canceled Entry 03/20/17 1440)     Initial Impression / Assessment and Plan / ED Course  I have reviewed the triage vital signs and the nursing notes.  Pertinent labs & imaging results that were available during my care of the patient were reviewed by me and considered in my medical decision making (see chart for details).     Patient with chest pain. Recent admission for same. Plan was for stress test pain return. Patient has returned. Became more constant.  EKG reassuring. Seen in the ER by cardiology and will transfer to [facility] for likely heart catheterization. Started on heparin and nitroglycerin also.  CRITICAL CARE Performed by: Mackie Pai Total critical care time: 30 minutes Critical care time was exclusive of separately billable procedures and treating other patients. Critical care was necessary to treat or prevent imminent or life-threatening deterioration. Critical care was  time spent personally by me on the following activities: development of treatment plan with patient and/or surrogate as well as nursing, discussions with consultants, evaluation of patient's response to treatment, examination of patient, obtaining history from patient or surrogate, ordering and performing treatments and interventions, ordering and review of laboratory studies, ordering and review of radiographic studies, pulse oximetry and re-evaluation of patient's condition.  Final Clinical Impressions(s) / ED Diagnoses   Final diagnoses:  Unstable angina [facility])    New Prescriptions Current Discharge Medication List       Davonna Belling, MD 03/20/17 1516

## 2017-03-20 NOTE — H&P (Signed)
Physician History and Physical    Anthony Hensley MRN: [id number] DOB/AGE: 1961/06/09 56 y.o. Admit date: 03/20/2017  Primary Care Physician: [name] PA-C Primary Cardiologist: Bronson Ing Requesting physician: Alvino Chapel  HPI: The patient is a 56 year old male with a history of coronary artery disease with drug-eluting stent placement to the mid circumflex in the setting of a non-STEMI in October 2016. He also has a history of hypertension, hyperlipidemia, and ongoing tobacco abuse.   He was hospitalized for chest pain last week. He ruled out for an acute coronary syndrome. He was evaluated by Dr. Domenic Polite at that time was started Imdur 30 mg. He commented that if the patient were to continue to have chest pain, coronary angiography would likely need to be performed.  He felt well after being discharged last Friday. He felt well on Saturday. On his way to church on Sunday morning, he began experiencing precordial chest discomfort. He said it was sometimes sharp and sometimes dull. He took nitroglycerin with incomplete relief of the pain. The pain waxed and waned throughout Sunday. It is associated with shortness of breath. When he walks he feels off balance which is exacerbated with chest pain. He denies syncope.  He smokes over a pack of cigarettes daily.  He underwent a nuclear stress test on 10/21/16 which showed a defect in the inferior, inferoapical, and inferoseptal wall which was consistent with probable prior myocardial infarction with peri-infarct ischemia and deemed a low to intermediate risk study, LVEF 60%.  Echocardiogram on 10/21/16 demonstrated normal left ventricular systolic function and regional wall motion, LVEF 46-65%, grade 1 diastolic dysfunction, and mild aortic regurgitation.  He was scheduled to see me in the office this week.  Dr. Alvino Chapel from the ED to tell me about this patient.  GI cocktail has not relieved the pain in the ED. ECG shows sinus rhythm  with an incomplete right bundle-branch block. Chest x-ray and troponin are normal.  Review of systems complete and found to be negative unless listed above   No family history of premature CAD in 1st degree relatives.  Social History   Social History  . Marital status: Married    Spouse name: N/A  . Number of children: N/A  . Years of education: N/A   Occupational History  . Machine Teacher, English as a foreign language And [employer]   Social History Main Topics  . Smoking status: Current Some Day Smoker    Packs/day: 0.75    Years: 35.00    Types: Cigarettes  . Smokeless tobacco: Never Used  . Alcohol use No  . Drug use: No  . Sexual activity: Not on file   Other Topics Concern  . Not on file   Social History Narrative  . No narrative on file      Physical Exam: Blood pressure 118/89, pulse 61, temperature 98.4 F (36.9 C), temperature source Oral, resp. rate 18, height 5\' 6"  (1.676 m), weight 163 lb (73.9 kg), SpO2 97 %.  General: NAD Neck: No JVD, no thyromegaly or thyroid nodule.  Lungs: Clear to auscultation bilaterally with normal respiratory effort. CV: Nondisplaced PMI.  Heart regular S1/S2, no S3/S4, no murmur.  No peripheral edema.  No carotid bruit.  Normal pedal pulses.  Abdomen: Soft, nontender, no hepatosplenomegaly, no distention.  Skin: Intact without lesions or rashes.  Neurologic: Alert and oriented x 3.  Psych: Normal affect. Extremities: No clubbing or cyanosis.  HEENT: Normal.   Labs:   Lab Results  Component Value  Date   WBC 5.8 03/20/2017   HGB 15.3 03/20/2017   HCT 46.2 03/20/2017   MCV 93.3 03/20/2017   PLT 207 03/20/2017    Recent Labs Lab 03/20/17 0731  NA 140  K 4.3  CL 104  CO2 30  BUN 20  CREATININE 0.88  CALCIUM 9.1  GLUCOSE 80   Lab Results  Component Value Date   TROPONINI <0.03 03/20/2017    Lab Results  Component Value Date   CHOL 126 03/20/2017   CHOL 155 03/16/2017   CHOL 209 (H) 07/12/2015   Lab Results   Component Value Date   HDL 43 03/20/2017   HDL 45 03/16/2017   HDL 34 (L) 07/12/2015   Lab Results  Component Value Date   LDLCALC 65 03/20/2017   LDLCALC 90 03/16/2017   LDLCALC 150 (H) 07/12/2015   Lab Results  Component Value Date   TRIG 91 03/20/2017   TRIG 99 03/16/2017   TRIG 126 07/12/2015   Lab Results  Component Value Date   CHOLHDL 2.9 03/20/2017   CHOLHDL 3.4 03/16/2017   CHOLHDL 6.1 07/12/2015   No results found for: LDLDIRECT    Cardiac Cath 07/12/2017: Conclusion    Prox RCA lesion, 20% stenosed.  Dist Cx lesion, 70% stenosed.  Prox LAD-1 lesion, 20% stenosed.  Prox LAD-2 lesion, 20% stenosed.  Mid LAD lesion, 20% stenosed.  1st Diag lesion, 30% stenosed.  Mid Cx lesion, 99% stenosed. There is a 0% residual stenosis post intervention.  A drug-eluting stent was placed extending from the mid Circumfelx into the Obtuse marginal branch  The left ventricular systolic function is normal.       ASSESSMENT AND PLAN:  1. Unstable angina: Symptoms have not been relieved with the addition of Imdur 30 mg. GI cocktail has failed to alleviate the pain in the ED. Symptoms appear to be consistent with unstable angina. Intravenous heparin and nitroglycerin will be started. He will be transferred to [facility] for coronary angiography. Continue aspirin, Plavix, lisinopril, beta blocker, and statin.  2. Tobacco abuse disorder: Ongoing.  3. Hypertension: Controlled. No changes to therapy.  4. Hyperlipidemia: Continue Crestor.  Signed: Kate Sable, M.D., F.A.C.C. 03/20/2017, 8:49 AM

## 2017-03-20 NOTE — ED Triage Notes (Signed)
Pt reports recurring chest pain for which he was admitted a few days ago.  States he was d/c Friday and felt better and then pain and shortness of breath returned on Sunday morning.

## 2017-03-20 NOTE — Interval H&P Note (Signed)
History and Physical Interval Note:  03/20/2017 2:31 PM  Anthony Hensley  has presented today for surgery, with the diagnosis of Cp  The various methods of treatment have been discussed with the patient and family. After consideration of risks, benefits and other options for treatment, the patient has consented to  Procedure(s): Left Heart Cath and Coronary Angiography (N/A) as a surgical intervention .  The patient's history has been reviewed, patient examined, no change in status, stable for surgery.  I have reviewed the patient's chart and labs.  Questions were answered to the patient's satisfaction.    Cath Lab Visit (complete for each Cath Lab visit)  Clinical Evaluation Leading to the Procedure:   ACS: Yes.    Non-ACS:    Anginal Classification: CCS IV  Anti-ischemic medical therapy: No Therapy  Non-Invasive Test Results: No non-invasive testing performed  Prior CABG: No previous CABG       Collier Salina [facility] 03/20/2017 2:31 PM

## 2017-03-21 ENCOUNTER — Encounter (HOSPITAL_COMMUNITY): Payer: Self-pay | Admitting: Cardiology

## 2017-03-21 ENCOUNTER — Ambulatory Visit: Payer: Commercial Managed Care - PPO | Admitting: Cardiovascular Disease

## 2017-03-21 DIAGNOSIS — I200 Unstable angina: Secondary | ICD-10-CM | POA: Diagnosis not present

## 2017-03-21 DIAGNOSIS — I25119 Atherosclerotic heart disease of native coronary artery with unspecified angina pectoris: Secondary | ICD-10-CM | POA: Diagnosis not present

## 2017-03-21 DIAGNOSIS — Z955 Presence of coronary angioplasty implant and graft: Secondary | ICD-10-CM | POA: Diagnosis not present

## 2017-03-21 DIAGNOSIS — I252 Old myocardial infarction: Secondary | ICD-10-CM | POA: Diagnosis not present

## 2017-03-21 DIAGNOSIS — I25110 Atherosclerotic heart disease of native coronary artery with unstable angina pectoris: Secondary | ICD-10-CM | POA: Diagnosis not present

## 2017-03-21 DIAGNOSIS — F17210 Nicotine dependence, cigarettes, uncomplicated: Secondary | ICD-10-CM | POA: Diagnosis not present

## 2017-03-21 DIAGNOSIS — Z720 Tobacco use: Secondary | ICD-10-CM | POA: Diagnosis not present

## 2017-03-21 LAB — LIPID PANEL
CHOLESTEROL: 109 mg/dL (ref 0–200)
HDL: 33 mg/dL — ABNORMAL LOW (ref >40)
LDL Cholesterol: 64 mg/dL (ref 0–99)
Total CHOL/HDL Ratio: 3.3 RATIO
Triglycerides: 59 mg/dL (ref <150)
VLDL: 12 mg/dL (ref 0–40)

## 2017-03-21 LAB — BASIC METABOLIC PANEL
Anion gap: 5 (ref 5–15)
BUN: 18 mg/dL (ref 6–20)
CHLORIDE: 106 mmol/L (ref 101–111)
CO2: 27 mmol/L (ref 22–32)
CREATININE: 0.87 mg/dL (ref 0.61–1.24)
Calcium: 8.5 mg/dL — ABNORMAL LOW (ref 8.9–10.3)
GFR calc Af Amer: >60 mL/min (ref >60)
GFR calc non Af Amer: >60 mL/min (ref >60)
GLUCOSE: 96 mg/dL (ref 65–99)
Potassium: 3.7 mmol/L (ref 3.5–5.1)
SODIUM: 138 mmol/L (ref 135–145)

## 2017-03-21 LAB — CBC
HCT: 41.2 % (ref 39.0–52.0)
Hemoglobin: 13.3 g/dL (ref 13.0–17.0)
MCH: 30 pg (ref 26.0–34.0)
MCHC: 32.3 g/dL (ref 30.0–36.0)
MCV: 92.8 fL (ref 78.0–100.0)
PLATELETS: 182 10*3/uL (ref 150–400)
RBC: 4.44 MIL/uL (ref 4.22–5.81)
RDW: 12.7 % (ref 11.5–15.5)
WBC: 5.8 10*3/uL (ref 4.0–10.5)

## 2017-03-21 MED ORDER — ROSUVASTATIN CALCIUM 20 MG PO TABS
40.0000 mg | ORAL_TABLET | Freq: Every day | ORAL | Status: DC
  Administered 2017-03-21: 40 mg via ORAL
  Filled 2017-03-21: qty 2

## 2017-03-21 MED ORDER — LISINOPRIL 2.5 MG PO TABS
2.5000 mg | ORAL_TABLET | Freq: Every day | ORAL | Status: DC
  Administered 2017-03-21 – 2017-03-22 (×2): 2.5 mg via ORAL
  Filled 2017-03-21 (×2): qty 1

## 2017-03-21 MED ORDER — ISOSORBIDE MONONITRATE ER 30 MG PO TB24
30.0000 mg | ORAL_TABLET | Freq: Every day | ORAL | Status: DC
  Administered 2017-03-21 – 2017-03-22 (×2): 30 mg via ORAL
  Filled 2017-03-21 (×2): qty 1

## 2017-03-21 MED ORDER — KETOROLAC TROMETHAMINE 30 MG/ML IJ SOLN: 30.0000 mg | Freq: Once | INTRAMUSCULAR | Status: DC

## 2017-03-21 NOTE — Progress Notes (Signed)
Complained of chest pain scale 4,aching. bp - 119/81 nitro x3 given with relief, ekg done without any changes, PA aware with order. Continue to monitor.

## 2017-03-21 NOTE — Discharge Summary (Signed)
Discharge Summary    Patient ID: Anthony Hensley,  MRN: [id number], DOB/AGE: October 29, 1960 56 y.o.  Admit date: 03/20/2017 Discharge date: 03/22/2017  Primary Care Provider: Jake Samples Primary Cardiologist: Bronson Ing  Discharge Diagnoses    Active Problems:   Coronary artery disease involving native heart with angina pectoris [facility])   CAD (coronary artery disease)   Unstable angina ([place])   Allergies No Known Allergies  Diagnostic Studies/Procedures    LHC: 03/20/17  Conclusion     Prox RCA lesion, 20 %stenosed.  Mid Cx lesion, 0 %stenosed at prior stent site.  Dist Cx lesion, 70 %stenosed.  Mid LAD lesion, 20 %stenosed.  1st Diag lesion, 30 %stenosed.  Prox LAD-1 lesion, 30 %stenosed.  Prox LAD-2 lesion, 40 %stenosed.  The left ventricular systolic function is normal.  LV end diastolic pressure is normal.  The left ventricular ejection fraction is 55-65% by visual estimate.   1. Single vessel obstructive CAD with a 70% stenosis in the distal LCx (small vessel). This is unchanged from prior study. Continue patency of stent in the mid LCx extending into the OM. 2. Normal LV function 3. Normal LVEDP  Plan: continue medical therapy. Smoking cessation.    _____________   History of Present Illness     56 year old male with a history of coronary artery disease with drug-eluting stent placement to the mid circumflex in the setting of a non-STEMI in October 2016. He also has a history of hypertension, hyperlipidemia, and ongoing tobacco abuse.   He was hospitalized for chest pain one week prior to admission. He ruled out for an acute coronary syndrome. He was evaluated by Dr. Domenic Polite at that time was started Imdur 30 mg. He commented that if the patient were to continue to have chest pain, coronary angiography would likely need to be performed.  He felt well after being discharged that Friday. He felt well on Saturday. On his way to church on Sunday  morning, he began experiencing precordial chest discomfort. He said it was sometimes sharp and sometimes dull. He took nitroglycerin with incomplete relief of the pain. The pain waxed and waned throughout Sunday. It is associated with shortness of breath. When he walked he felt off balance which is exacerbated with chest pain. He denied syncope.  He smokes over a pack of cigarettes daily.  He underwent a nuclear stress test on 10/21/16 which showed a defect in the inferior, inferoapical, and inferoseptal wall which was consistent with probable prior myocardial infarction with peri-infarct ischemia and deemed a low to intermediate risk study, LVEF 60%.  Echocardiogram on 10/21/16 demonstrated normal left ventricular systolic function and regional wall motion, LVEF 40-98%, grade 1 diastolic dysfunction, and mild aortic regurgitation.  In the ED he was given a GI cocktail without relief. ECG showed sinus rhythm with an incomplete right bundle-branch block. Chest x-ray and troponin are normal. Symptoms were felt to be consistent with unstable angina and he was referred to [facility] for cardiac cath.   Hospital Course     Underwent LHC with Dr. Martin[place] showing patent mLCx stent with normal LV function, and LVEDP. Plan to continue with medical therapy. Smoking cessation was strongly encouraged. Developed transient hypotension with BB, lisinopril and imdur held. Hypotension improved and lisinopril/Imdur was resumed. Cholesterol 109; HDL 33; LDL Cholesterol 64; Triglycerides 59; VLDL 12, his statin was continued. He was able to ambulate without chest pain.   He was seen by Dr. Claiborne Billings and determined stable for discharge home. Follow  up in the office has been arranged. Medications are listed below.   General: Well developed, well nourished, male appearing in no acute distress. Head: Normocephalic, atraumatic.  Neck: Supple without bruits, JVD. Lungs:  Resp regular and unlabored, CTA. Heart: RRR, S1, S2, no S3,  S4, or murmur; no rub. Abdomen: Soft, non-tender, non-distended with normoactive bowel sounds. No hepatomegaly. No rebound/guarding. No obvious abdominal masses. Extremities: No clubbing, cyanosis, edema. Distal pedal pulses are 2+ bilaterally. R radial cath site stable without bruising or hematoma Neuro: Alert and oriented X 3. Moves all extremities spontaneously. Psych: Normal affect.  _____________  Discharge Vitals Blood pressure 122/86, pulse (!) 54, temperature 97.7 F (36.5 C), temperature source Oral, resp. rate 10, height 5\' 6"  (1.676 m), weight 163 lb (73.9 kg), SpO2 95 %.  Filed Weights   03/20/17 0721 03/20/17 1244  Weight: 163 lb (73.9 kg) 163 lb (73.9 kg)    Labs & Radiologic Studies    CBC  Recent Labs  03/20/17 0731 03/21/17 0219 03/22/17 0310  WBC 5.8 5.8 5.9  NEUTROABS 3.4  --   --   HGB 15.3 13.3 13.9  HCT 46.2 41.2 42.9  MCV 93.3 92.8 92.7  PLT 207 182 458   Basic Metabolic Panel  Recent Labs  03/20/17 1253 03/21/17 0219  NA 139 138  K 3.7 3.7  CL 108 106  CO2 25 27  GLUCOSE 97 96  BUN 14 18  CREATININE 0.79 0.87  CALCIUM 8.6* 8.5*   Liver Function Tests No results for input(s): AST, ALT, ALKPHOS, BILITOT, PROT, ALBUMIN in the last 72 hours. No results for input(s): LIPASE, AMYLASE in the last 72 hours. Cardiac Enzymes  Recent Labs  03/20/17 0731  TROPONINI <0.03   BNP Invalid input(s): POCBNP D-Dimer No results for input(s): DDIMER in the last 72 hours. Hemoglobin A1C No results for input(s): HGBA1C in the last 72 hours. Fasting Lipid Panel  Recent Labs  03/21/17 0219  CHOL 109  HDL 33*  LDLCALC 64  TRIG 59  CHOLHDL 3.3   Thyroid Function Tests No results for input(s): TSH, T4TOTAL, T3FREE, THYROIDAB in the last 72 hours.  Invalid input(s): FREET3 _____________  Dg Chest 2 View  Result Date: 03/20/2017 CLINICAL DATA:  Chest pain since yesterday morning.  Tobacco use. EXAM: CHEST  2 VIEW COMPARISON:  03/16/2017  FINDINGS: The lungs are clear without airspace disease or pulmonary edema. Heart and mediastinum are within normal limits. The trachea is midline. No large pleural effusions. No acute bone abnormality. IMPRESSION: No active cardiopulmonary disease. Electronically Signed   By: Markus Daft M.D.   On: 03/20/2017 08:19   Dg Chest 2 View  Result Date: 03/16/2017 CLINICAL DATA:  Chest pain EXAM: CHEST  2 VIEW COMPARISON:  July 11, 2015 FINDINGS: There is no edema or consolidation. The heart size and pulmonary vascularity are normal. No adenopathy. No bone lesions. IMPRESSION: No edema or consolidation. Electronically Signed   By: Lowella Grip III M.D.   On: 03/16/2017 07:57   Disposition   Pt is being discharged home today in good condition.  Follow-up Plans & Appointments    Follow-up Information    [name], NP Follow up on 04/06/2017.   Specialties:  Nurse Practitioner, Radiology, Cardiology Why:  at 1pm for your follow up appt Contact information: [place] [address] [phone]          Discharge Instructions    Call MD for:  redness, tenderness, or signs  of infection (pain, swelling, redness, odor or green/yellow discharge around incision site)    Complete by:  As directed    Diet - low sodium heart healthy    Complete by:  As directed    Discharge instructions    Complete by:  As directed    Radial Site Care Refer to this sheet in the next few weeks. These instructions provide you with information on caring for yourself after your procedure. Your caregiver may also give you more specific instructions. Your treatment has been planned according to current medical practices, but problems sometimes occur. Call your caregiver if you have any problems or questions after your procedure. HOME CARE INSTRUCTIONS You may shower the day after the procedure.Remove the bandage (dressing) and gently wash the site with plain soap and water.Gently pat the site  dry.  Do not apply powder or lotion to the site.  Do not submerge the affected site in water for 3 to 5 days.  Inspect the site at least twice daily.  Do not flex or bend the affected arm for 24 hours.  No lifting over 5 pounds (2.3 kg) for 5 days after your procedure.  Do not drive home if you are discharged the same day of the procedure. Have someone else drive you.  You may drive 24 hours after the procedure unless otherwise instructed by your caregiver.  What to expect: Any bruising will usually fade within 1 to 2 weeks.  Blood that collects in the tissue (hematoma) may be painful to the touch. It should usually decrease in size and tenderness within 1 to 2 weeks.  SEEK IMMEDIATE MEDICAL CARE IF: You have unusual pain at the radial site.  You have redness, warmth, swelling, or pain at the radial site.  You have drainage (other than a small amount of blood on the dressing).  You have chills.  You have a fever or persistent symptoms for more than 72 hours.  You have a fever and your symptoms suddenly get worse.  Your arm becomes pale, cool, tingly, or numb.  You have heavy bleeding from the site. Hold pressure on the site.   Increase activity slowly    Complete by:  As directed       Discharge Medications   Current Discharge Medication List    CONTINUE these medications which have NOT CHANGED   Details  albuterol (PROVENTIL) (2.5 MG/3ML) 0.083% nebulizer solution Take 3 mLs by nebulization daily as needed for shortness of breath. Refills: 3    aspirin 81 MG EC tablet Take 1 tablet (81 mg total) by mouth daily. Qty: 30 tablet    clopidogrel (PLAVIX) 75 MG tablet TAKE 1 TABLET BY MOUTH EVERY DAY WITH BREAKFAST Qty: 30 tablet, Refills: 6    Glycerin-Hypromellose-PEG 400 (VISINE TEARS OP) Apply 1 drop to eye daily as needed (dry eyes).     isosorbide mononitrate (IMDUR) 30 MG 24 hr tablet Take 1 tablet (30 mg total) by mouth daily. Qty: 30 tablet, Refills: 1    lisinopril  (PRINIVIL,ZESTRIL) 2.5 MG tablet TAKE 1 TABLET BY MOUTH daily Qty: 30 tablet, Refills: 6    metoprolol tartrate (LOPRESSOR) 25 MG tablet Take 0.5 tablets (12.5 mg total) by mouth 2 (two) times daily. Qty: 90 tablet, Refills: 3    NITROSTAT 0.4 MG SL tablet Place 1 tablet under the tongue daily as needed for chest pain. Refills: 1    PROAIR HFA 108 (90 Base) MCG/ACT inhaler Inhale 2 puffs into the lungs every  4 (four) hours as needed for shortness of breath. Refills: 3    RABEprazole (ACIPHEX) 20 MG tablet Take 1 tablet (20 mg total) by mouth daily. Qty: 30 tablet, Refills: 5    rosuvastatin (CRESTOR) 40 MG tablet Take 1 tablet (40 mg total) by mouth daily at 6 PM. Qty: 30 tablet, Refills: 1          Outstanding Labs/Studies   N/A  Duration of Discharge Encounter   Greater than 30 minutes including physician time.  Signed, Reino Bellis NP-C 03/22/2017, 9:36 AM    Patient seen and examined. Agree with assessment and plan. No recurrent chest pain. Reviewed angio finding with patient. Discussed smoking cessation.  OK for DC today.   Troy Sine, MD, [facility] 03/22/2017 9:37 AM

## 2017-03-21 NOTE — Progress Notes (Addendum)
Progress Note  Patient Name: Anthony Hensley Date of Encounter: 03/21/2017  Primary Cardiologist: Dr. Bronson Ing   Subjective  Feeling well. No chest pain, sob or palpitations.   Inpatient Medications    Scheduled Meds: . aspirin EC  81 mg Oral Daily  . ketorolac  30 mg Intravenous Once  . nicotine  14 mg Transdermal Daily  . sodium chloride flush  3 mL Intravenous Q12H   Continuous Infusions: . sodium chloride 10 mL/hr at 03/20/17 0740  . sodium chloride     PRN Meds: sodium chloride, nitroGLYCERIN, ondansetron (ZOFRAN) IV, sodium chloride flush   Vital Signs    Vitals:   03/21/17 0333 03/21/17 0736 03/21/17 1234 03/21/17 1600  BP: 131/82 109/74 (!) 104/37   Pulse: (!) 52 63 63   Resp: (!) 9 13 11    Temp: 98 F (36.7 C) 97.9 F (36.6 C) 98 F (36.7 C)   TempSrc: Oral Oral Oral   SpO2: 93% 92% 94% 98%  Weight:      Height:        Intake/Output Summary (Last 24 hours) at 03/21/17 1618 Last data filed at 03/21/17 1300  Gross per 24 hour  Intake            746.8 ml  Output             1400 ml  Net           -653.2 ml   Filed Weights   03/20/17 0721 03/20/17 1244  Weight: 163 lb (73.9 kg) 163 lb (73.9 kg)    Telemetry    NSR - Personally Reviewed  ECG    NSR - Personally Reviewed  Physical Exam   GEN: No acute distress.   Neck: No JVD Cardiac: RRR, no murmurs, rubs, or gallops. R radial cath site stable Respiratory: Clear to auscultation bilaterally. GI: Soft, nontender, non-distended  MS: No edema; No deformity. Neuro:  Nonfocal  Psych: Normal affect   Labs    Chemistry Recent Labs Lab 03/20/17 0731 03/20/17 1253 03/21/17 0219  NA 140 139 138  K 4.3 3.7 3.7  CL 104 108 106  CO2 30 25 27   GLUCOSE 80 97 96  BUN 20 14 18   CREATININE 0.88 0.79 0.87  CALCIUM 9.1 8.6* 8.5*  GFRNONAA >60 >60 >60  GFRAA >60 >60 >60  ANIONGAP 6 6 5      Hematology Recent Labs Lab 03/16/17 0719 03/20/17 0731 03/21/17 0219  WBC 7.5 5.8 5.8  RBC  5.06 4.95 4.44  HGB 15.9 15.3 13.3  HCT 46.9 46.2 41.2  MCV 92.7 93.3 92.8  MCH 31.4 30.9 30.0  MCHC 33.9 33.1 32.3  RDW 12.7 12.6 12.7  PLT 201 207 182    Cardiac Enzymes Recent Labs Lab 03/16/17 0719 03/16/17 1323 03/16/17 1830 03/20/17 0731  TROPONINI <0.03 <0.03 <0.03 <0.03   No results for input(s): TROPIPOC in the last 168 hours.   BNPNo results for input(s): BNP, PROBNP in the last 168 hours.   DDimer No results for input(s): DDIMER in the last 168 hours.   Radiology    Dg Chest 2 View  Result Date: 03/20/2017 CLINICAL DATA:  Chest pain since yesterday morning.  Tobacco use. EXAM: CHEST  2 VIEW COMPARISON:  03/16/2017 FINDINGS: The lungs are clear without airspace disease or pulmonary edema. Heart and mediastinum are within normal limits. The trachea is midline. No large pleural effusions. No acute bone abnormality. IMPRESSION: No active cardiopulmonary disease. Electronically Signed  By: Markus Daft M.D.   On: 03/20/2017 08:19    Cardiac Studies   Cath 03/20/17  Prox RCA lesion, 20 %stenosed.  Mid Cx lesion, 0 %stenosed at prior stent site.  Dist Cx lesion, 70 %stenosed.  Mid LAD lesion, 20 %stenosed.  1st Diag lesion, 30 %stenosed.  Prox LAD-1 lesion, 30 %stenosed.  Prox LAD-2 lesion, 40 %stenosed.  The left ventricular systolic function is normal.  LV end diastolic pressure is normal.  The left ventricular ejection fraction is 55-65% by visual estimate.   1. Single vessel obstructive CAD with a 70% stenosis in the distal LCx (small vessel). This is unchanged from prior study. Continue patency of stent in the mid LCx extending into the OM. 2. Normal LV function 3. Normal LVEDP  Plan: continue medical therapy. Smoking cessation.   Patient Profile     56 y.o. male history of coronary artery disease with drug-eluting stent placement to the mid circumflex in the setting of a non-STEMI in October 2016, hypertension, hyperlipidemia, and ongoing  tobacco abuse transferred for [facility] for cath with unstable angina.   Assessment & Plan    1. Unstable angina - troponin negative. Cath showed patent stent in the mid LCx extending into the OM. Single vessel obstructive CAD with a 70% stenosis in the distal LCx (small vessel). This is unchanged from prior study. Continue medical therapy.  - Continue ASA. Plavix hold post cath (last PCI 07/2015). Resumption per MD.   2. HTN - Imdur 30mg , lisinopril 2.5mg  and lopressor 12.5mg  BID hold due to hypotension. Now BP improved. Will resume imdur and lisinopril today.  3. HLD - .03/21/2017: Cholesterol 109; HDL 33; LDL Cholesterol 64; Triglycerides 59; VLDL 12  - Continue statin  4. Tobacco abuse - advised cessation. Education given.   Ambulate. Discharge tomorrow.   Jarrett Soho, PA  03/21/2017, 4:18 PM    Patient seen and examined. Agree with assessment and plan.  Cath data reviewed. Patent LCX stent and mild additional CAD, unchganged. Meds to be resumed.  Smoking cessation is imperative. Plan for dc tomorrow.   Troy Sine, MD, [facility] 03/21/2017 6:27 PM

## 2017-03-22 DIAGNOSIS — I200 Unstable angina: Secondary | ICD-10-CM | POA: Diagnosis not present

## 2017-03-22 DIAGNOSIS — I25119 Atherosclerotic heart disease of native coronary artery with unspecified angina pectoris: Secondary | ICD-10-CM | POA: Diagnosis not present

## 2017-03-22 DIAGNOSIS — Z720 Tobacco use: Secondary | ICD-10-CM | POA: Diagnosis not present

## 2017-03-22 LAB — CBC
HEMATOCRIT: 42.9 % (ref 39.0–52.0)
Hemoglobin: 13.9 g/dL (ref 13.0–17.0)
MCH: 30 pg (ref 26.0–34.0)
MCHC: 32.4 g/dL (ref 30.0–36.0)
MCV: 92.7 fL (ref 78.0–100.0)
PLATELETS: 188 10*3/uL (ref 150–400)
RBC: 4.63 MIL/uL (ref 4.22–5.81)
RDW: 12.7 % (ref 11.5–15.5)
WBC: 5.9 10*3/uL (ref 4.0–10.5)

## 2017-03-22 NOTE — Care Management Note (Addendum)
Case Management Note  Patient Details  Name: Anthony Hensley MRN: [id number] Date of Birth: [birth year]  Subjective/Objective:   Pt is status post cath                  Action/Plan:   PTA independent from home with wife.  Pt has PCP and denied barriers to obtaining medications as prescribed.  No CM needs determined prior to discharge   Expected Discharge Date:  03/22/17               Expected Discharge Plan:  Home/Self Care  In-House Referral:     Discharge planning Services  CM Consult  Post Acute Care Choice:    Choice offered to:     DME Arranged:    DME Agency:     HH Arranged:    HH Agency:     Status of Service:     If discussed at [employer], dates discussed:    Additional Comments:  Maryclare Labrador, RN 03/22/2017, 10:23 AM

## 2017-03-22 NOTE — Progress Notes (Signed)
Discharged home by wheelchair, stable, accompanied by wife, discharge instructions and return to work note given to pt. Belonging taken home.

## 2017-03-23 IMAGING — CR DG CHEST 2V
2 series · 2 of 2 positions shown · non-contrast
Comparison: Report from 04/04/2003

CLINICAL DATA: Dizziness for 1 month. Sweating after dizzy episodes
today. History of hypertension.

EXAM:
CHEST  2 VIEW

[chest lat]
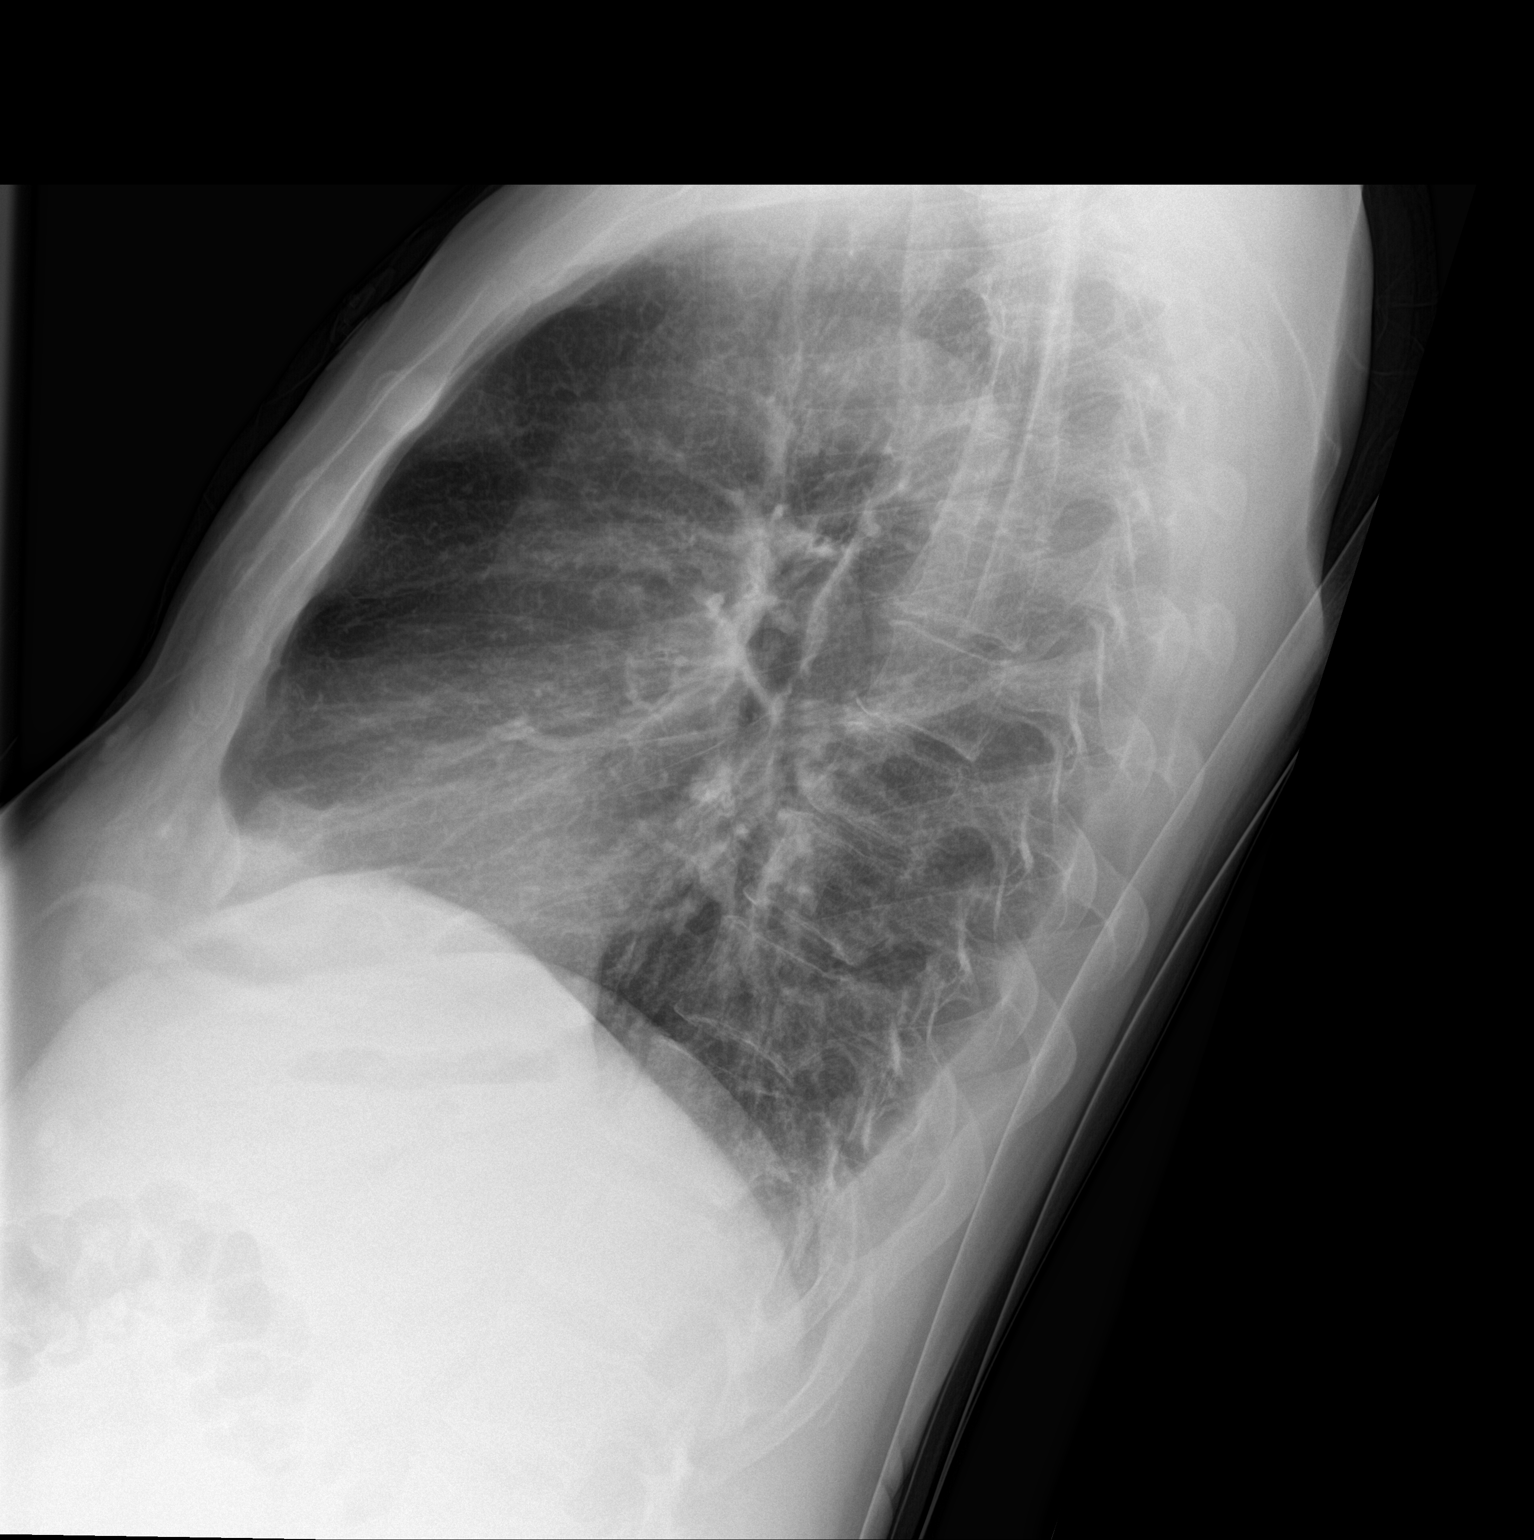

[chest ap]
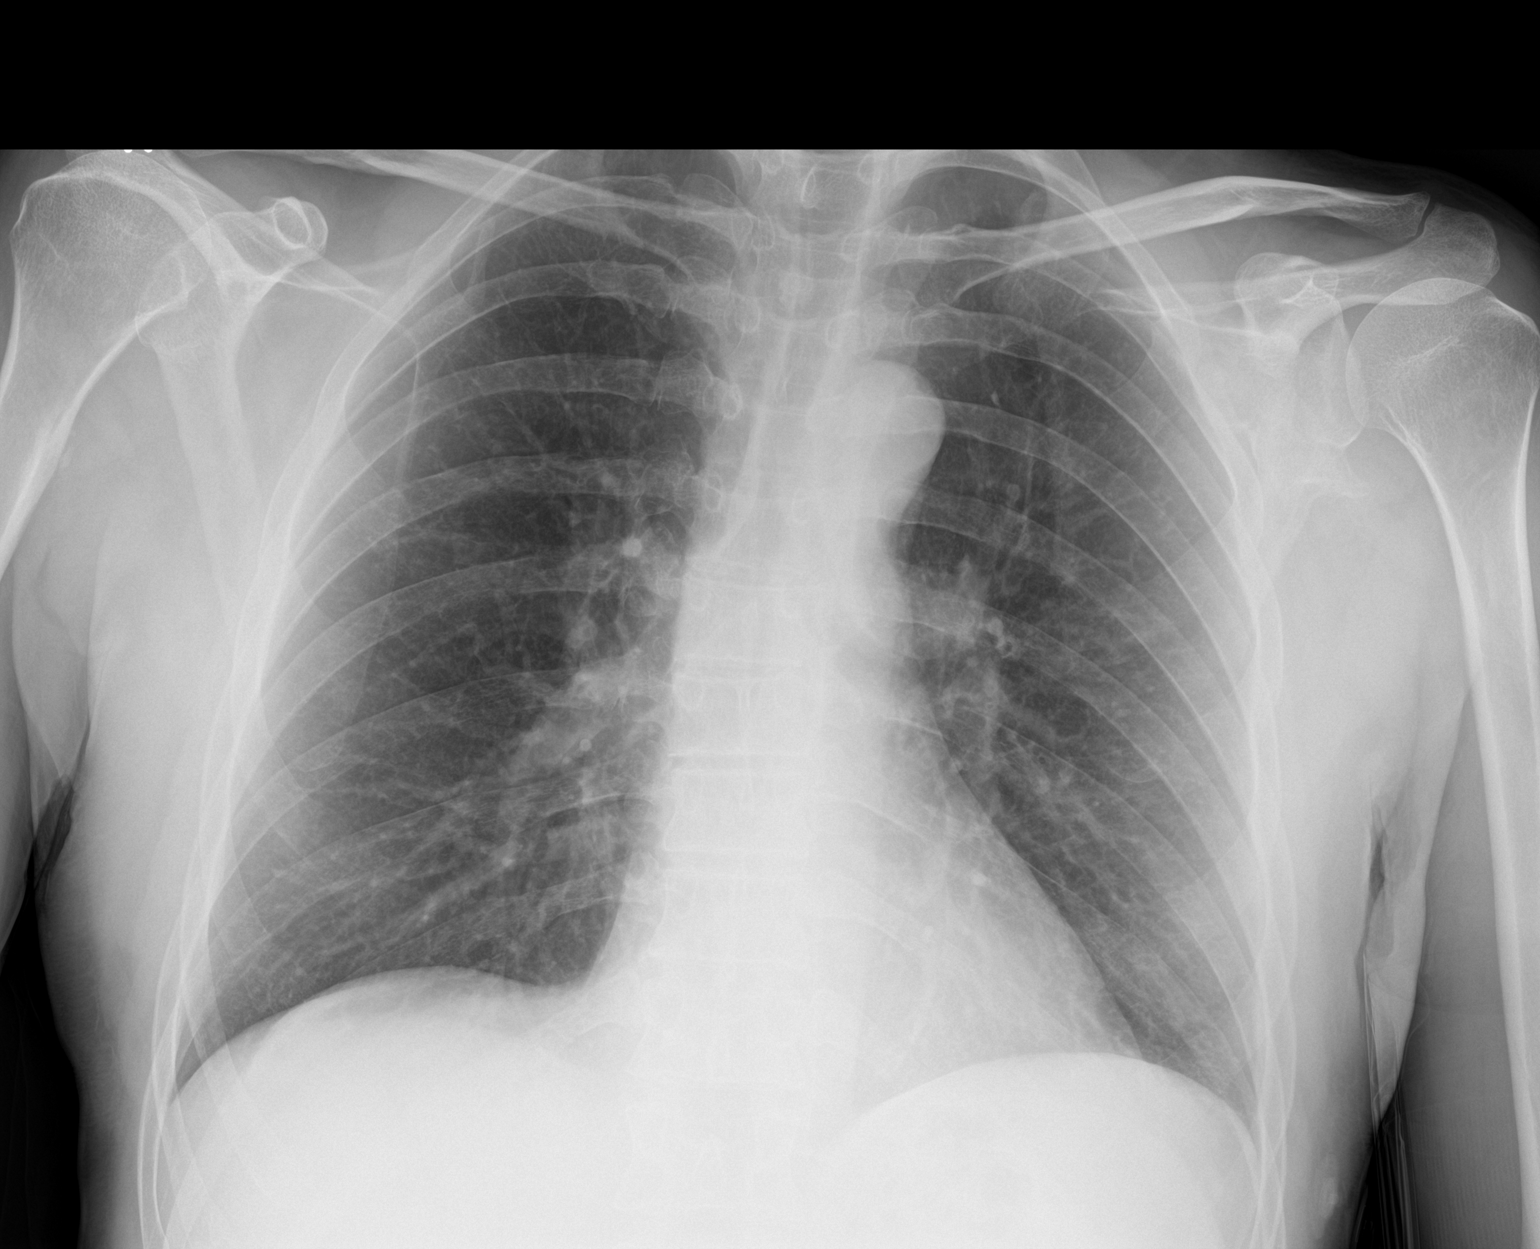

[2 of 2 positions shown; findings below may reference images not displayed]

FINDINGS: Airway thickening is present, suggesting bronchitis or reactive
airways disease. Cardiac and mediastinal margins appear normal. No
pleural effusion or airspace opacity noted.

Deformity of the left inferior scapular neck, query remote fracture
versus less likely exostosis.
IMPRESSION: 1. Airway thickening is present, suggesting bronchitis or reactive
airways disease.
2. Deformity of the left scapular neck probably from an old
fracture, less likely due to an exostosis.

## 2017-04-06 ENCOUNTER — Ambulatory Visit (INDEPENDENT_AMBULATORY_CARE_PROVIDER_SITE_OTHER): Payer: Commercial Managed Care - PPO | Admitting: Adult Health

## 2017-04-06 ENCOUNTER — Encounter: Payer: Self-pay | Admitting: Adult Health

## 2017-04-06 ENCOUNTER — Encounter: Payer: Self-pay | Admitting: *Deleted

## 2017-04-06 VITALS — BP 119/71 | HR 64 | Ht 66.0 in | Wt 167.0 lb

## 2017-04-06 DIAGNOSIS — K219 Gastro-esophageal reflux disease without esophagitis: Secondary | ICD-10-CM | POA: Diagnosis not present

## 2017-04-06 DIAGNOSIS — I2510 Atherosclerotic heart disease of native coronary artery without angina pectoris: Secondary | ICD-10-CM | POA: Diagnosis not present

## 2017-04-06 DIAGNOSIS — Z720 Tobacco use: Secondary | ICD-10-CM

## 2017-04-06 DIAGNOSIS — E7800 Pure hypercholesterolemia, unspecified: Secondary | ICD-10-CM

## 2017-04-06 MED ORDER — METOPROLOL SUCCINATE ER 50 MG PO TB24: ORAL_TABLET | ORAL | 3 refills | Status: DC

## 2017-04-06 MED ORDER — PANTOPRAZOLE SODIUM 40 MG PO TBEC: 40.0000 mg | DELAYED_RELEASE_TABLET | Freq: Every day | ORAL | 11 refills | Status: DC

## 2017-04-06 MED ORDER — VARENICLINE TARTRATE 0.5 MG X 11 & 1 MG X 42 PO MISC: ORAL | 0 refills | Status: DC

## 2017-04-06 NOTE — Patient Instructions (Addendum)
Your physician wants you to follow-up in: 6 Months.  You will receive a reminder letter in the mail two months in advance. If you don't receive a letter, please call our office to schedule the follow-up appointment.  Your physician has recommended you make the following change in your medication:   Start Chantix   Start Toprol XL 25 mg ( Take 1/2 Tablet) At Bed Time   If you need a refill on your cardiac medications before your next appointment, please call your pharmacy.  Thank you for choosing [facility]!

## 2017-04-06 NOTE — Progress Notes (Signed)
Cardiology Office Note   Date:  04/06/2017   ID:  Anthony Hensley, DOB 1961/06/17, MRN [id number]  PCP:  Scherrie Bateman  Cardiologist:  Bronson Ing  Chief Complaint  Patient presents with  . Coronary Artery Disease  . Chest Pain  . Hospitalization Follow-up      History of Present Illness: Anthony Hensley is a 56 y.o. male who presents for ongoing assessment and management of coronary artery disease, with history of drug-eluting stent placement to the mid circumflex in the setting of a non-ST elevation MI in October 2016, history of hypertension, hyperlipidemia, and on going tobacco abuse.  He is here today post hospitalization after admission for recurrent chest pain and cardiac catheterization. Repeat cardiac catheterization revealed single vessel obstructive CAD with a 70% stenosis in the distal left circumflex (small vessel) which is unchanged from prior study. He had continued patency of the stent in the mid circumflex extending in the OM. He was to continue medical therapy and smoking cessation was strongly recommended.  He comes today with continued chest discomfort. He believes is now is acid reflux. He says he when he takes Tums is helpful. He is on AcipHex but it is unhelpful. He has not seen his GI physician since February 2018.   Past Medical History:  Diagnosis Date  . Anginal pain ([place])   . Arthritis    " IN MY NECK & SHOULDERS "  . CAD (coronary artery disease)    DES to mid circumflex October 2016  . Diverticulosis   . Dyspnea     AT TIMES"  . GERD (gastroesophageal reflux disease)   . Hypercholesterolemia   . Hypertension   . NSTEMI (non-ST elevated myocardial infarction) [facility])    October 2016    Past Surgical History:  Procedure Laterality Date  . CARDIAC CATHETERIZATION N/A 07/13/2015   Procedure: Left Heart Cath and Coronary Angiography;  Surgeon: Burnell Blanks, MD;  Location: [place] [facility];  Service: Cardiovascular;  Laterality: N/A;    . CARDIAC CATHETERIZATION N/A 07/13/2015   PCI + DES to the mid circ. LVEF was normal at 65%  . COLONOSCOPY N/A 12/31/2015   Dr.Rourk- diverticulosis,21mm polyp in the splenic flexure, 51mm polyp in the splenic flexure, 29mm polyp in the sigmoid colon bx= traditional serrated adenoma  . ESOPHAGOGASTRODUODENOSCOPY N/A 12/31/2015   Dr.Rourk- esophagitis with no bleeding, diffuse moderately erythematous mucosa without bleeding was found in the entire examined stomach. stomach bx= slight chronic inflammation. esophagus bx= benign gastresophageal junction mucosa  . LEFT HEART CATH AND CORONARY ANGIOGRAPHY N/A 03/20/2017   Procedure: Left Heart Cath and Coronary Angiography;  Surgeon: Martinique, Peter M, MD;  Location: [place] [facility];  Service: Cardiovascular;  Laterality: N/A;  . Venia Minks DILATION N/A 12/31/2015   Procedure: Venia Minks DILATION;  Surgeon: [name], MD;  [facility];  Service: Endoscopy;  Laterality: N/A;     Current Outpatient Prescriptions  Medication Sig Dispense Refill  . albuterol (PROVENTIL) (2.5 MG/3ML) 0.083% nebulizer solution Take 3 mLs by nebulization daily as needed for shortness of breath.  3  . aspirin 81 MG EC tablet Take 1 tablet (81 mg total) by mouth daily. 30 tablet   . clopidogrel (PLAVIX) 75 MG tablet TAKE 1 TABLET BY MOUTH EVERY DAY WITH BREAKFAST 30 tablet 6  . Glycerin-Hypromellose-PEG 400 (VISINE TEARS OP) Apply 1 drop to eye daily as needed (dry eyes).     . isosorbide mononitrate (IMDUR) 30 MG 24 hr tablet  Take 1 tablet (30 mg total) by mouth daily. 30 tablet 1  . lisinopril (PRINIVIL,ZESTRIL) 2.5 MG tablet TAKE 1 TABLET BY MOUTH daily 30 tablet 6  . metoprolol tartrate (LOPRESSOR) 25 MG tablet Take 0.5 tablets (12.5 mg total) by mouth 2 (two) times daily. 90 tablet 3  . NITROSTAT 0.4 MG SL tablet Place 1 tablet under the tongue daily as needed for chest pain.  1  . PROAIR HFA 108 (90 Base) MCG/ACT inhaler Inhale 2 puffs into the lungs every 4  (four) hours as needed for shortness of breath.  3  . RABEprazole (ACIPHEX) 20 MG tablet Take 1 tablet (20 mg total) by mouth daily. 30 tablet 5  . rosuvastatin (CRESTOR) 40 MG tablet Take 1 tablet (40 mg total) by mouth daily at 6 PM. 30 tablet 1   No current facility-administered medications for this visit.     Allergies:   Patient has no known allergies.    Social History:  The patient  reports that he has been smoking Cigarettes.  He started smoking about 40 years ago. He has a 26.25 pack-year smoking history. He has never used smokeless tobacco. He reports that he does not drink alcohol or use drugs.   Family History:  The patient's family history includes Heart attack (age of onset: 24) in his father.    ROS: All other systems are reviewed and negative. Unless otherwise mentioned in H&P    PHYSICAL EXAM: VS:  BP 119/71   Pulse 64   Ht 5\' 6"  (1.676 m)   Wt 167 lb (75.8 kg)   BMI 26.95 kg/m  , BMI Body mass index is 26.95 kg/m. GEN: Well nourished, well developed, in no acute distress  HEENT: normal  Neck: no JVD, carotid bruits, or masses Cardiac: RRR; no murmurs, rubs, or gallops,no edema  Respiratory:  clear to auscultation bilaterally, normal work of breathing GI: soft, nontender, nondistended, + BS MS: no deformity or atrophy  Skin: warm and dry, no rash Neuro:  Strength and sensation are intact Psych: euthymic mood, full affect   Recent Labs: 09/26/2016: ALT 30 03/21/2017: BUN 18; Creatinine, Ser 0.87; Potassium 3.7; Sodium 138 03/22/2017: Hemoglobin 13.9; Platelets 188    Lipid Panel    Component Value Date/Time   CHOL 109 03/21/2017 0219   TRIG 59 03/21/2017 0219   HDL 33 (L) 03/21/2017 0219   CHOLHDL 3.3 03/21/2017 0219   VLDL 12 03/21/2017 0219   LDLCALC 64 03/21/2017 0219      Wt Readings from Last 3 Encounters:  04/06/17 167 lb (75.8 kg)  03/20/17 163 lb (73.9 kg)  03/16/17 163 lb 4.8 oz (74.1 kg)      Other studies Reviewed: [facility]:  03/20/17  Conclusion     Prox RCA lesion, 20 %stenosed.  Mid Cx lesion, 0 %stenosed at prior stent site.  Dist Cx lesion, 70 %stenosed.  Mid LAD lesion, 20 %stenosed.  1st Diag lesion, 30 %stenosed.  Prox LAD-1 lesion, 30 %stenosed.  Prox LAD-2 lesion, 40 %stenosed.  The left ventricular systolic function is normal.  LV end diastolic pressure is normal.  The left ventricular ejection fraction is 55-65% by visual estimate.  1. Single vessel obstructive CAD with a 70% stenosis in the distal LCx (small vessel). This is unchanged from prior study. Continue patency of stent in the mid LCx extending into the OM. 2. Normal LV function 3. Normal LVEDP     ASSESSMENT AND PLAN:  1. Recurrent chest pain: May be related  to worsening GERD. Cardiac catheterization was reassuring. The patient is referred to his GI physician for ongoing management. He states AcipHex is not work. He is on Plavix. Options are limited concerning PPI. We'll begin low-dose had pantoprazole  as this has less drug interaction. Defer to GI for medication adjustments or further testing.  2. Coronary artery disease: Cardiac catheterization as above. Single vessel obstructive CAD, 70% stenosis in the distal LAD unchanged from prior study, with patent stent to the mid left circumflex. Continue medical management with beta blocker. We'll change to metoprolol 12.5 mg twice a day to 25 mg XL at at bedtime. Continue dual antiplatelet therapy.  3. Hypercholesterolemia: Continue statin therapy.  4.Tobacco abuse: Patient requests Rx for Chantix. A Chantix starter pack is prescribed, he will need to follow with primary care for ongoing management. He is been warned of side effects in some people of having vivid dreams. He verbalizes understanding   Current medicines are reviewed at length with the patient today.    Labs/ tests ordered today include:  Phill Myron. West Pugh, ANP, AACC   04/06/2017 1:17 PM    [facility] [address] [place], [place] [address] Phone: [phone]; Fax: [phone]

## 2017-04-10 ENCOUNTER — Encounter: Payer: Self-pay | Admitting: Internal Medicine

## 2017-04-28 ENCOUNTER — Ambulatory Visit (INDEPENDENT_AMBULATORY_CARE_PROVIDER_SITE_OTHER): Payer: Commercial Managed Care - PPO | Admitting: Adult Health

## 2017-04-28 ENCOUNTER — Encounter: Payer: Self-pay | Admitting: Adult Health

## 2017-04-28 ENCOUNTER — Encounter: Payer: Self-pay | Admitting: *Deleted

## 2017-04-28 VITALS — BP 122/68 | HR 86 | Ht 66.0 in | Wt 166.0 lb

## 2017-04-28 DIAGNOSIS — Z720 Tobacco use: Secondary | ICD-10-CM | POA: Diagnosis not present

## 2017-04-28 DIAGNOSIS — I10 Essential (primary) hypertension: Secondary | ICD-10-CM | POA: Diagnosis not present

## 2017-04-28 DIAGNOSIS — I2510 Atherosclerotic heart disease of native coronary artery without angina pectoris: Secondary | ICD-10-CM | POA: Diagnosis not present

## 2017-04-28 DIAGNOSIS — E7800 Pure hypercholesterolemia, unspecified: Secondary | ICD-10-CM

## 2017-04-28 MED ORDER — METOPROLOL SUCCINATE ER 50 MG PO TB24: 50.0000 mg | ORAL_TABLET | Freq: Every day | ORAL | 3 refills | Status: DC

## 2017-04-28 NOTE — Patient Instructions (Signed)
Medication Instructions:   Your physician has recommended you make the following change in your medication:   Stop metoprolol tartrate (lopressor) 25 mg.  Change metoprolol succinate (toprol xl) to taking a whole 50 mg tablet at bedtime.  Take tramadol 50 mg every 6 hours as needed for pain.  Continue all other medications the same.  Labwork:  Your physician recommends that you return for fasting lab work in: 6 months just before your next visit to check your BMET, FLP & LFT's. Please make sure you are fasting for at least 8 hours before you have this done.  Testing/Procedures:  NONE  Follow-Up:  Your physician recommends that you schedule a follow-up appointment in: 6 months. You will receive a reminder letter in the mail in about 4 months reminding you to call and schedule your appointment. If you don't receive this letter, please contact our office.  Any Other Special Instructions Will Be Listed Below (If Applicable).  If you need a refill on your cardiac medications before your next appointment, please call your pharmacy.

## 2017-04-28 NOTE — Progress Notes (Signed)
Cardiology Office Note   Date:  04/28/2017   ID:  Anthony Hensley, DOB [birth year], MRN [id number]  PCP:  Scherrie Bateman  Cardiologist:  Bronson Ing  Chief Complaint  Patient presents with  . Coronary Artery Disease  . Hypertension      History of Present Illness: Anthony Hensley is a 56 y.o. male who presents for ongoing assessment and management of coronary artery disease with history of DES to the mid circumflex in the setting of a non-ST elevation MI in October 2016, history of hypertension, hyperlipidemia, and ongoing tobacco abuse.  Repeat cardiac catheterization revealed single vessel obstructive CAD with a 70% stenosis in the distal left circumflex (small vessel) which is unchanged from prior study. He had continued patency of the stent in the mid circumflex extending in the OM. He was to continue medical therapy and smoking cessation was strongly recommended.  The patient was just seen in the office on 04/06/2017 posthospitalization where he was diagnosed noncardiac chest pain. He was continued on GI medications in this is thought to be related to GERD. He was referred to GI for ongoing management.  He comes today with questions about his medications. He is on multiple doses of metoprolol and is taking tramadol around the clock.   He is down to 1 pack a day of cigarettes from 2/12 packs a day on the Chantix.   Past Medical History:  Diagnosis Date  . Anginal pain ([place])   . Arthritis    " IN MY NECK & SHOULDERS "  . CAD (coronary artery disease)    DES to mid circumflex October 2016  . Diverticulosis   . Dyspnea     AT TIMES"  . GERD (gastroesophageal reflux disease)   . Hypercholesterolemia   . Hypertension   . NSTEMI (non-ST elevated myocardial infarction) [facility])    October 2016    Past Surgical History:  Procedure Laterality Date  . CARDIAC CATHETERIZATION N/A 07/13/2015   Procedure: Left Heart Cath and Coronary Angiography;  Surgeon: Burnell Blanks,  MD;  Location: [place] [facility];  Service: Cardiovascular;  Laterality: N/A;  . CARDIAC CATHETERIZATION N/A 07/13/2015   PCI + DES to the mid circ. LVEF was normal at 65%  . COLONOSCOPY N/A 12/31/2015   Dr.Rourk- diverticulosis,34mm polyp in the splenic flexure, 88mm polyp in the splenic flexure, 25mm polyp in the sigmoid colon bx= traditional serrated adenoma  . ESOPHAGOGASTRODUODENOSCOPY N/A 12/31/2015   Dr.Rourk- esophagitis with no bleeding, diffuse moderately erythematous mucosa without bleeding was found in the entire examined stomach. stomach bx= slight chronic inflammation. esophagus bx= benign gastresophageal junction mucosa  . LEFT HEART CATH AND CORONARY ANGIOGRAPHY N/A 03/20/2017   Procedure: Left Heart Cath and Coronary Angiography;  Surgeon: Martinique, Peter M, MD;  Location: [place] [facility];  Service: Cardiovascular;  Laterality: N/A;  . Venia Minks DILATION N/A 12/31/2015   Procedure: Venia Minks DILATION;  Surgeon: [name], MD;  [facility];  Service: Endoscopy;  Laterality: N/A;     Current Outpatient Prescriptions  Medication Sig Dispense Refill  . albuterol (PROVENTIL) (2.5 MG/3ML) 0.083% nebulizer solution Take 3 mLs by nebulization daily as needed for shortness of breath.  3  . aspirin 81 MG EC tablet Take 1 tablet (81 mg total) by mouth daily. 30 tablet   . clopidogrel (PLAVIX) 75 MG tablet TAKE 1 TABLET BY MOUTH EVERY DAY WITH BREAKFAST 30 tablet 6  . Glycerin-Hypromellose-PEG 400 (VISINE TEARS OP) Apply 1 drop to  eye daily as needed (dry eyes).     . isosorbide mononitrate (IMDUR) 30 MG 24 hr tablet Take 1 tablet (30 mg total) by mouth daily. 30 tablet 1  . lisinopril (PRINIVIL,ZESTRIL) 2.5 MG tablet TAKE 1 TABLET BY MOUTH daily 30 tablet 6  . metoprolol succinate (TOPROL-XL) 50 MG 24 hr tablet 25 mg (1/2 Tablet) Take At Bedtime 90 tablet 3  . metoprolol tartrate (LOPRESSOR) 25 MG tablet Take 0.5 tablets (12.5 mg total) by mouth 2 (two) times daily. 90 tablet 3    . NITROSTAT 0.4 MG SL tablet Place 1 tablet under the tongue daily as needed for chest pain.  1  . pantoprazole (PROTONIX) 40 MG tablet Take 1 tablet (40 mg total) by mouth daily. 30 tablet 11  . PROAIR HFA 108 (90 Base) MCG/ACT inhaler Inhale 2 puffs into the lungs every 4 (four) hours as needed for shortness of breath.  3  . rosuvastatin (CRESTOR) 40 MG tablet Take 1 tablet (40 mg total) by mouth daily at 6 PM. 30 tablet 1  . traMADol (ULTRAM) 50 MG tablet Take by mouth every 6 (six) hours as needed.    . varenicline (CHANTIX STARTING MONTH PAK) 0.5 MG X 11 & 1 MG X 42 tablet Take one 0.5 mg tablet by mouth once daily for 3 days, then increase to one 0.5 mg tablet twice daily for 4 days, then increase to one 1 mg tablet twice daily. 53 tablet 0   No current facility-administered medications for this visit.     Allergies:   Patient has no known allergies.    Social History:  The patient  reports that he has been smoking Cigarettes.  He started smoking about 40 years ago. He has a 26.25 pack-year smoking history. He has never used smokeless tobacco. He reports that he does not drink alcohol or use drugs.   Family History:  The patient's family history includes Heart attack (age of onset: 77) in his father.    ROS: All other systems are reviewed and negative. Unless otherwise mentioned in H&P    PHYSICAL EXAM: VS:  BP 122/68   Pulse 86   Ht 5\' 6"  (1.676 m)   Wt 166 lb (75.3 kg)   SpO2 96%   BMI 26.79 kg/m  , BMI Body mass index is 26.79 kg/m. GEN: Well nourished, well developed, in no acute distress  Cardiac: RRR; no murmurs, rubs, or gallops,no edema  Respiratory:  clear to auscultation bilaterally, normal work of breathing GI: soft, nontender, nondistended, + BS Psych: euthymic mood, full affect  Recent Labs: 09/26/2016: ALT 30 03/21/2017: BUN 18; Creatinine, Ser 0.87; Potassium 3.7; Sodium 138 03/22/2017: Hemoglobin 13.9; Platelets 188    Lipid Panel    Component Value  Date/Time   CHOL 109 03/21/2017 0219   TRIG 59 03/21/2017 0219   HDL 33 (L) 03/21/2017 0219   CHOLHDL 3.3 03/21/2017 0219   VLDL 12 03/21/2017 0219   LDLCALC 64 03/21/2017 0219      Wt Readings from Last 3 Encounters:  04/28/17 166 lb (75.3 kg)  04/06/17 167 lb (75.8 kg)  03/20/17 163 lb (73.9 kg)    ASSESSMENT AND PLAN:  1. CAD: No recurrence of chest pain. I have reviewed his medications. He will stop the metoprolol tartrate 12.5 mg BID, and stay on metoprolol succinate, 50 mg at HS instead of 25 mg @ HS. He will continue statin therapy, ASA, and Plavix.    Follow up BMET, and fasting lipids  and LFT;s in 6 months.   2. Hypertension: Continue current medications with changes as above.   3. Tobacco abuse: Doing better on Chantix. Down to 1 pack a day from 2 1/2 packs a day. He will continue to wean down and eventually quit. I have encouraged him in this lifestyle change.   4. Chronic Abdominal Pain: He is to see GI specialist in August. Continue PPI.    Current medicines are reviewed at length with the patient today.    Labs/ tests ordered today include: BMET and Fasting lipids and LFTs in 6 months.   Phill Myron. West Pugh, ANP, AACC   04/28/2017 3:52 PM    [facility] [address] [place], [place] [address] Phone: [phone]; Fax: [phone]

## 2017-05-11 ENCOUNTER — Telehealth: Payer: Self-pay | Admitting: Adult Health

## 2017-05-11 NOTE — Telephone Encounter (Signed)
Please call regarding Chantix / tg

## 2017-05-11 NOTE — Telephone Encounter (Signed)
Returned pt call, no answer- left message for pt to return call.  

## 2017-05-17 NOTE — Telephone Encounter (Signed)
Tried to reach pt. No answer. I mailed letter asking pt to contact us if her still needs assistance with his chantix.

## 2017-05-18 ENCOUNTER — Ambulatory Visit: Payer: Commercial Managed Care - PPO | Admitting: Nurse Practitioner

## 2017-06-06 ENCOUNTER — Other Ambulatory Visit: Payer: Self-pay | Admitting: *Deleted

## 2017-06-06 MED ORDER — ISOSORBIDE MONONITRATE ER 30 MG PO TB24: 30.0000 mg | ORAL_TABLET | Freq: Every day | ORAL | 2 refills | Status: DC

## 2017-06-06 MED ORDER — ROSUVASTATIN CALCIUM 40 MG PO TABS: 40.0000 mg | ORAL_TABLET | Freq: Every day | ORAL | 2 refills | Status: DC

## 2017-06-29 ENCOUNTER — Encounter: Payer: Self-pay | Admitting: Nurse Practitioner

## 2017-06-29 ENCOUNTER — Ambulatory Visit (INDEPENDENT_AMBULATORY_CARE_PROVIDER_SITE_OTHER): Payer: Commercial Managed Care - PPO | Admitting: Nurse Practitioner

## 2017-06-29 VITALS — BP 144/86 | HR 77 | Temp 98.2°F | Ht 66.0 in | Wt 172.3 lb

## 2017-06-29 DIAGNOSIS — R1033 Periumbilical pain: Secondary | ICD-10-CM | POA: Diagnosis not present

## 2017-06-29 DIAGNOSIS — R197 Diarrhea, unspecified: Secondary | ICD-10-CM

## 2017-06-29 DIAGNOSIS — K219 Gastro-esophageal reflux disease without esophagitis: Secondary | ICD-10-CM | POA: Diagnosis not present

## 2017-06-29 MED ORDER — DICYCLOMINE HCL 10 MG PO CAPS: 10.0000 mg | ORAL_CAPSULE | Freq: Four times a day (QID) | ORAL | 1 refills | Status: DC | PRN

## 2017-06-29 NOTE — Assessment & Plan Note (Signed)
GERD symptoms somewhat well-controlled still with irregular breakthrough symptoms. At this point I'll have him increase his PPI to twice a day to see if this helps. Return for follow-up in 3 months.

## 2017-06-29 NOTE — Progress Notes (Signed)
cc'ed to pcp °

## 2017-06-29 NOTE — Assessment & Plan Note (Signed)
Persistent abdominal pain which is generalized in different areas of his abdomen including mid abdomen, upper abdomen, occasional right upper quadrant. No significant tenderness to palpation on exam. Multiple possible etiologies for his abdominal pain including uncontrolled GERD, biliary etiology, irritable bowel syndrome. Further management as per above based on potential etiologies. Return for follow-up in 3 months.

## 2017-06-29 NOTE — Patient Instructions (Signed)
1. Increase your Protonix to twice a day. 2. We will have the schedule your right upper quadrant ultrasound. 3. I sent in Bentyl 10 mg 4 times a day as needed. 4. Return for follow-up in 3 months. 5. Call if you have any questions or concerns.

## 2017-06-29 NOTE — Assessment & Plan Note (Signed)
Intermittent diarrhea about 1-2 times a week. This occurs typically with abdominal pain, more often in the morning. Some postprandial association. Given his diarrhea and persistent abdominal pain biliary etiology is not out of the question. Cardiology questioned this as well. I will send him for right upper quadrant ultrasound to check for choledocholithiasis and possible chronic cholecystitis. In the meantime I will start him on Bentyl 10 mg 4 times a day as needed for diarrhea for symptomatic management. Return for follow-up in 3 months.

## 2017-06-29 NOTE — Progress Notes (Signed)
Referring Provider: Jake Samples, PA* Primary Care Physician:  Jake Samples, PA-C Primary GI:  Dr. Gala Romney  Chief Complaint  Patient presents with  . Gastroesophageal Reflux    HPI:   Anthony Hensley is a 56 y.o. male who presents for follow-up on reflux. The patient was last seen in our office 12/07/2016 for constipation, abdominal pain, diarrhea. Colonoscopy report reviewed. He is up-to-date on his colonoscopy and do for surveillance in 2022. Had previously had improvement in GERD with AcipHex and improvement in constipation with Colace. He was admitted in December 2017 for small bowel obstruction and/or a virus. C. difficile was negative. Diarrhea and abdominal pain resolved during admission. At his last visit he was doing "so-so" and noted that diarrhea and abdominal pain, while resolved during hospitalization, he began having recurrence of diarrhea about a month prior, intermittent, 5-6 times a week. Otherwise normal stools. No constipation and some time. Midabdominal pain intermittent, varying in severity. Has hematochezia about 5-6 times a week, scant amount on the toilet tissue in the setting of known hemorrhoids. Occasional hemorrhoid irritation. Labs are ordered including CBC, CMP, lipase. Stool studies were also ordered including C. difficile and GI pathogen panel. Started on probiotic. Recommended follow-up in 6 weeks.  Does not appear that labs or stool tests were completed.  Today he states he's doing well. GERD symptoms are doing "pretty good." Has breakthrough symptoms about 3-4 times a week; last 30 minutes to an hour and resolve spontaneously; symptoms include chest pain and recently had LHC with no change from previous cath, single vessel 70% and continued stent patency. Followed by cardiology. Has mid-abdominal pain, intermittent, described as dull pain. Occasional diarrhea, no improvement in pain after bowel movement. Has diarrhea about 2 times a week, at least once  a week. Otherwise stools are formed. Denies N/V. Has intermittent hematochezia at least once a week in the setting of known hemorrhoids. Not on any treatment for hemorrhoids. Denies melena, unintentional weight loss, fever, chills. Denies dyspnea, dizziness, lightheadedness, syncope, near syncope. Denies any other upper or lower GI symptoms.  Occasionally pain and diarrhea is postprandial, sometimes not.  Past Medical History:  Diagnosis Date  . Anginal pain ([place])   . Arthritis    " IN MY NECK & SHOULDERS "  . CAD (coronary artery disease)    DES to mid circumflex October 2016  . Diverticulosis   . Dyspnea     AT TIMES"  . GERD (gastroesophageal reflux disease)   . Hypercholesterolemia   . Hypertension   . NSTEMI (non-ST elevated myocardial infarction) [facility])    October 2016    Past Surgical History:  Procedure Laterality Date  . CARDIAC CATHETERIZATION N/A 07/13/2015   Procedure: Left Heart Cath and Coronary Angiography;  Surgeon: Burnell Blanks, MD;  Location: [facility];  Service: Cardiovascular;  Laterality: N/A;  . CARDIAC CATHETERIZATION N/A 07/13/2015   PCI + DES to the mid circ. LVEF was normal at 65%  . COLONOSCOPY N/A 12/31/2015   Dr.Rourk- diverticulosis,11mm polyp in the splenic flexure, 67mm polyp in the splenic flexure, 12mm polyp in the sigmoid colon bx= traditional serrated adenoma  . ESOPHAGOGASTRODUODENOSCOPY N/A 12/31/2015   Dr.Rourk- esophagitis with no bleeding, diffuse moderately erythematous mucosa without bleeding was found in the entire examined stomach. stomach bx= slight chronic inflammation. esophagus bx= benign gastresophageal junction mucosa  . LEFT HEART CATH AND CORONARY ANGIOGRAPHY N/A 03/20/2017   Procedure: Left Heart Cath and Coronary Angiography;  Surgeon:  Martinique, Peter M, MD;  Location: [place] [facility];  Service: Cardiovascular;  Laterality: N/A;  . Venia Minks DILATION N/A 12/31/2015   Procedure: Venia Minks DILATION;  Surgeon: [name],  MD;  [facility];  Service: Endoscopy;  Laterality: N/A;    Current Outpatient Prescriptions  Medication Sig Dispense Refill  . albuterol (PROVENTIL) (2.5 MG/3ML) 0.083% nebulizer solution Take 3 mLs by nebulization daily as needed for shortness of breath.  3  . aspirin 81 MG EC tablet Take 1 tablet (81 mg total) by mouth daily. 30 tablet   . clopidogrel (PLAVIX) 75 MG tablet TAKE 1 TABLET BY MOUTH EVERY DAY WITH BREAKFAST 30 tablet 6  . Glycerin-Hypromellose-PEG 400 (VISINE TEARS OP) Apply 1 drop to eye daily as needed (dry eyes).     . isosorbide mononitrate (IMDUR) 30 MG 24 hr tablet Take 1 tablet (30 mg total) by mouth daily. 90 tablet 2  . lisinopril (PRINIVIL,ZESTRIL) 2.5 MG tablet TAKE 1 TABLET BY MOUTH daily 30 tablet 6  . metoprolol succinate (TOPROL-XL) 50 MG 24 hr tablet Take 1 tablet (50 mg total) by mouth at bedtime. 90 tablet 3  . NITROSTAT 0.4 MG SL tablet Place 1 tablet under the tongue daily as needed for chest pain.  1  . pantoprazole (PROTONIX) 40 MG tablet Take 1 tablet (40 mg total) by mouth daily. 30 tablet 11  . PROAIR HFA 108 (90 Base) MCG/ACT inhaler Inhale 2 puffs into the lungs every 4 (four) hours as needed for shortness of breath.  3  . rosuvastatin (CRESTOR) 40 MG tablet Take 1 tablet (40 mg total) by mouth daily at 6 PM. 90 tablet 2   No current facility-administered medications for this visit.     Allergies as of 06/29/2017  . (No Known Allergies)    Family History  Problem Relation Age of Onset  . Heart attack Father 46       Deceased  . Colon cancer Neg Hx     Social History   Social History  . Marital status: Married    Spouse name: N/A  . Number of children: N/A  . Years of education: N/A   Occupational History  . Machine Teacher, English as a foreign language [employer]   Social History Main Topics  . Smoking status: Current Some Day Smoker    Packs/day: 0.75    Years: 35.00    Types: Cigarettes    Start date: 08/08/1976  .  Smokeless tobacco: Never Used  . Alcohol use No  . Drug use: No  . Sexual activity: Not Asked   Other Topics Concern  . None   Social History Narrative  . None    Review of Systems: General: Negative for anorexia, weight loss, fever, chills, fatigue, weakness. Eyes: Negative for vision changes.  ENT: Negative for hoarseness, difficulty swallowing , nasal congestion. CV: Negative for chest pain, angina, palpitations, dyspnea on exertion, peripheral edema.  Respiratory: Negative for dyspnea at rest, dyspnea on exertion, cough, sputum, wheezing.  GI: See history of present illness. GU:  Negative for dysuria, hematuria, urinary incontinence, urinary frequency, nocturnal urination.  MS: Negative for joint pain, low back pain.  Derm: Negative for rash or itching.  Neuro: Negative for weakness, abnormal sensation, seizure, frequent headaches, memory loss, confusion.  Psych: Negative for anxiety, depression, suicidal ideation, hallucinations.  Endo: Negative for unusual weight change.  Heme: Negative for bruising or bleeding. Allergy: Negative for rash or hives.   Physical Exam: BP Marland Kitchen)  144/86   Pulse 77   Temp 98.2 F (36.8 C) (Oral)   Ht 5\' 6"  (1.676 m)   Wt 172 lb 4.8 oz (78.2 kg)   BMI 27.81 kg/m  General:   Alert and oriented. Pleasant and cooperative. Well-nourished and well-developed.  Head:  Normocephalic and atraumatic. Eyes:  Without icterus, sclera clear and conjunctiva pink.  Ears:  Normal auditory acuity. Mouth:  No deformity or lesions, oral mucosa pink.  Throat/Neck:  Supple, without mass or thyromegaly. Cardiovascular:  S1, S2 present without murmurs appreciated. Normal pulses noted. Extremities without clubbing or edema. Respiratory:  Clear to auscultation bilaterally. No wheezes, rales, or rhonchi. No distress.  Gastrointestinal:  +BS, soft, non-tender and non-distended. No HSM noted. No guarding or rebound. No masses appreciated.  Rectal:  Deferred    Musculoskalatal:  Symmetrical without gross deformities. Normal posture. Skin:  Intact without significant lesions or rashes. Neurologic:  Alert and oriented x4;  grossly normal neurologically. Psych:  Alert and cooperative. Normal mood and affect. Heme/Lymph/Immune: No significant cervical adenopathy. No excessive bruising noted.    06/29/2017 8:57 AM   Disclaimer: This note was dictated with voice recognition software. Similar sounding words can inadvertently be transcribed and may not be corrected upon review.

## 2017-07-06 ENCOUNTER — Ambulatory Visit (HOSPITAL_COMMUNITY)
Admission: RE | Admit: 2017-07-06 | Discharge: 2017-07-06 | Disposition: A | Discharge status: Home / Self Care | Payer: Commercial Managed Care - PPO | Source: Ambulatory Visit | Attending: Nurse Practitioner | Admitting: Nurse Practitioner

## 2017-07-06 DIAGNOSIS — R197 Diarrhea, unspecified: Secondary | ICD-10-CM | POA: Diagnosis present

## 2017-07-06 DIAGNOSIS — K219 Gastro-esophageal reflux disease without esophagitis: Secondary | ICD-10-CM | POA: Insufficient documentation

## 2017-07-06 DIAGNOSIS — R1033 Periumbilical pain: Secondary | ICD-10-CM | POA: Diagnosis present

## 2017-07-11 ENCOUNTER — Telehealth: Payer: Self-pay | Admitting: Internal Medicine

## 2017-07-11 NOTE — Telephone Encounter (Signed)
Routing to EG 

## 2017-07-11 NOTE — Telephone Encounter (Signed)
Patient faxed over forms for [place] to fill out about his medical conditions and what limitations he may have on the job. I clipped them to a green folder and placed in EG office. He was the last provider to see this patient.

## 2017-07-11 NOTE — Telephone Encounter (Signed)
Noted, routing to EG

## 2017-07-11 NOTE — Telephone Encounter (Signed)
Pt called to get our fax number and asked if his U/S results were back yet. I told him that I would send the nurse a message that he had called. [phone]

## 2017-07-20 ENCOUNTER — Telehealth: Payer: Self-pay | Admitting: Internal Medicine

## 2017-07-20 NOTE — Telephone Encounter (Signed)
Pt said he was returning a call to JL. He is on his lunch break until 130 if she could call him back. [phone]

## 2017-07-20 NOTE — Telephone Encounter (Signed)
I'm finishing up his paperwork. For the last section I need a description of his job and job functions to know if there may be any limitations.  Also, ask him what he feels his limitations are.

## 2017-07-20 NOTE — Telephone Encounter (Signed)
See result note.  

## 2017-07-24 ENCOUNTER — Telehealth: Payer: Self-pay | Admitting: Internal Medicine

## 2017-07-24 NOTE — Telephone Encounter (Signed)
Pt's wife called to say that when patient saw RMR last that he told patient to double up on his pantoprazole. Patient has run out and pharmacy will not refill it until we send in a new prescription. He uses [facility] [address] in [place]. Pharmacy # is [phone]

## 2017-07-25 NOTE — Telephone Encounter (Signed)
Routing to [place] who saw pt last.

## 2017-07-26 MED ORDER — PANTOPRAZOLE SODIUM 40 MG PO TBEC: 40.0000 mg | DELAYED_RELEASE_TABLET | Freq: Two times a day (BID) | ORAL | 1 refills | Status: DC

## 2017-07-26 NOTE — Telephone Encounter (Signed)
Spoke to Pts spouse. She is aware that pts rx was sent to pharmacy by Dr.Gill.

## 2017-07-26 NOTE — Telephone Encounter (Signed)
Please tell the patient a new Rx was sent to the pharmacy to reflect increase in dosing.

## 2017-07-26 NOTE — Addendum Note (Signed)
Addended by: Gordy Levan, Seferina Brokaw A on: 07/26/2017 03:46 PM   Modules accepted: Orders

## 2017-07-27 ENCOUNTER — Telehealth: Payer: Self-pay | Admitting: Internal Medicine

## 2017-07-27 NOTE — Telephone Encounter (Signed)
I put the short claim disability forms on EG desk to be filled out.

## 2017-07-27 NOTE — Telephone Encounter (Signed)
noted 

## 2017-07-27 NOTE — Telephone Encounter (Signed)
Company has faxed a job description.

## 2017-07-28 NOTE — Telephone Encounter (Signed)
Forms completed and given to [facility]

## 2017-07-31 ENCOUNTER — Telehealth: Payer: Self-pay | Admitting: Internal Medicine

## 2017-07-31 NOTE — Telephone Encounter (Signed)
FMLA papers have been completed and up front in the sample drawer. Pt is aware of $29 fee. Code has been entered and copies made for scanning.

## 2017-08-25 ENCOUNTER — Other Ambulatory Visit: Payer: Self-pay | Admitting: Nurse Practitioner

## 2017-09-27 ENCOUNTER — Ambulatory Visit: Payer: Commercial Managed Care - PPO | Admitting: Nurse Practitioner

## 2017-10-19 ENCOUNTER — Other Ambulatory Visit: Payer: Self-pay | Admitting: Adult Health

## 2017-11-16 ENCOUNTER — Ambulatory Visit: Payer: Commercial Managed Care - PPO | Admitting: Nurse Practitioner

## 2017-12-19 ENCOUNTER — Emergency Department (HOSPITAL_COMMUNITY): Payer: Commercial Managed Care - PPO

## 2017-12-19 ENCOUNTER — Observation Stay (HOSPITAL_COMMUNITY)
Admission: EM | Admit: 2017-12-19 | Discharge: 2017-12-20 | Disposition: A | Discharge status: Home / Self Care | Payer: Commercial Managed Care - PPO | Attending: Internal Medicine | Admitting: Internal Medicine

## 2017-12-19 ENCOUNTER — Observation Stay (HOSPITAL_BASED_OUTPATIENT_CLINIC_OR_DEPARTMENT_OTHER): Payer: Commercial Managed Care - PPO

## 2017-12-19 ENCOUNTER — Other Ambulatory Visit: Payer: Self-pay

## 2017-12-19 ENCOUNTER — Encounter (HOSPITAL_COMMUNITY): Payer: Self-pay | Admitting: *Deleted

## 2017-12-19 DIAGNOSIS — R079 Chest pain, unspecified: Secondary | ICD-10-CM | POA: Diagnosis present

## 2017-12-19 DIAGNOSIS — Z7901 Long term (current) use of anticoagulants: Secondary | ICD-10-CM | POA: Insufficient documentation

## 2017-12-19 DIAGNOSIS — F17210 Nicotine dependence, cigarettes, uncomplicated: Secondary | ICD-10-CM | POA: Insufficient documentation

## 2017-12-19 DIAGNOSIS — Z7982 Long term (current) use of aspirin: Secondary | ICD-10-CM | POA: Diagnosis not present

## 2017-12-19 DIAGNOSIS — K219 Gastro-esophageal reflux disease without esophagitis: Secondary | ICD-10-CM | POA: Diagnosis present

## 2017-12-19 DIAGNOSIS — E7849 Other hyperlipidemia: Secondary | ICD-10-CM

## 2017-12-19 DIAGNOSIS — R0602 Shortness of breath: Secondary | ICD-10-CM | POA: Diagnosis not present

## 2017-12-19 DIAGNOSIS — Z720 Tobacco use: Secondary | ICD-10-CM | POA: Diagnosis present

## 2017-12-19 DIAGNOSIS — E785 Hyperlipidemia, unspecified: Secondary | ICD-10-CM | POA: Diagnosis not present

## 2017-12-19 DIAGNOSIS — I200 Unstable angina: Secondary | ICD-10-CM | POA: Diagnosis not present

## 2017-12-19 DIAGNOSIS — I10 Essential (primary) hypertension: Secondary | ICD-10-CM | POA: Insufficient documentation

## 2017-12-19 DIAGNOSIS — Z79899 Other long term (current) drug therapy: Secondary | ICD-10-CM | POA: Diagnosis not present

## 2017-12-19 DIAGNOSIS — J449 Chronic obstructive pulmonary disease, unspecified: Secondary | ICD-10-CM | POA: Diagnosis not present

## 2017-12-19 DIAGNOSIS — R0789 Other chest pain: Secondary | ICD-10-CM | POA: Diagnosis not present

## 2017-12-19 DIAGNOSIS — E7800 Pure hypercholesterolemia, unspecified: Secondary | ICD-10-CM | POA: Diagnosis present

## 2017-12-19 DIAGNOSIS — I252 Old myocardial infarction: Secondary | ICD-10-CM | POA: Insufficient documentation

## 2017-12-19 LAB — BASIC METABOLIC PANEL
Anion gap: 8 (ref 5–15)
BUN: 23 mg/dL — AB (ref 6–20)
CALCIUM: 9.1 mg/dL (ref 8.9–10.3)
CO2: 28 mmol/L (ref 22–32)
CREATININE: 0.83 mg/dL (ref 0.61–1.24)
Chloride: 105 mmol/L (ref 101–111)
GFR calc Af Amer: >60 mL/min (ref >60)
GLUCOSE: 102 mg/dL — AB (ref 65–99)
Potassium: 4.3 mmol/L (ref 3.5–5.1)
SODIUM: 141 mmol/L (ref 135–145)

## 2017-12-19 LAB — HEMOGLOBIN A1C
HEMOGLOBIN A1C: 5.7 % — AB (ref 4.8–5.6)
MEAN PLASMA GLUCOSE: 116.89 mg/dL

## 2017-12-19 LAB — I-STAT TROPONIN, ED: TROPONIN I, POC: 0 ng/mL (ref 0.00–0.08)

## 2017-12-19 LAB — CBC
HCT: 45.7 % (ref 39.0–52.0)
Hemoglobin: 14.3 g/dL (ref 13.0–17.0)
MCH: 29.6 pg (ref 26.0–34.0)
MCHC: 31.3 g/dL (ref 30.0–36.0)
MCV: 94.6 fL (ref 78.0–100.0)
PLATELETS: 191 10*3/uL (ref 150–400)
RBC: 4.83 MIL/uL (ref 4.22–5.81)
RDW: 12.7 % (ref 11.5–15.5)
WBC: 4.9 10*3/uL (ref 4.0–10.5)

## 2017-12-19 LAB — HEPARIN LEVEL (UNFRACTIONATED): HEPARIN UNFRACTIONATED: 0.13 [IU]/mL — AB (ref 0.30–0.70)

## 2017-12-19 LAB — PROTIME-INR
INR: 0.9
Prothrombin Time: 12.1 seconds (ref 11.4–15.2)

## 2017-12-19 LAB — TROPONIN I
Troponin I: <0.03 ng/mL (ref <0.03)
Troponin I: <0.03 ng/mL (ref <0.03)

## 2017-12-19 LAB — APTT: APTT: 27 s (ref 24–36)

## 2017-12-19 LAB — ECHOCARDIOGRAM COMPLETE
Height: 66 in
Weight: 2720 oz

## 2017-12-19 LAB — TSH: TSH: 0.875 u[IU]/mL (ref 0.350–4.500)

## 2017-12-19 MED ORDER — NITROGLYCERIN 0.4 MG/HR TD PT24
0.4000 mg | MEDICATED_PATCH | Freq: Every day | TRANSDERMAL | Status: DC
  Administered 2017-12-19 – 2017-12-20 (×2): 0.4 mg via TRANSDERMAL
  Filled 2017-12-19 (×4): qty 1

## 2017-12-19 MED ORDER — MORPHINE SULFATE (PF) 4 MG/ML IV SOLN
4.0000 mg | Freq: Once | INTRAVENOUS | Status: AC
  Administered 2017-12-19: 4 mg via INTRAVENOUS
  Filled 2017-12-19: qty 1

## 2017-12-19 MED ORDER — HEPARIN (PORCINE) IN NACL 100-0.45 UNIT/ML-% IJ SOLN
1300.0000 [IU]/h | INTRAMUSCULAR | Status: DC
  Administered 2017-12-19: 1000 [IU]/h via INTRAVENOUS
  Filled 2017-12-19: qty 250

## 2017-12-19 MED ORDER — NICOTINE 14 MG/24HR TD PT24
14.0000 mg | MEDICATED_PATCH | Freq: Every day | TRANSDERMAL | Status: DC
  Administered 2017-12-19 – 2017-12-20 (×2): 14 mg via TRANSDERMAL
  Filled 2017-12-19 (×2): qty 1

## 2017-12-19 MED ORDER — ONDANSETRON HCL 4 MG/2ML IJ SOLN: 4.0000 mg | Freq: Four times a day (QID) | INTRAMUSCULAR | Status: DC | PRN

## 2017-12-19 MED ORDER — ASPIRIN EC 81 MG PO TBEC
81.0000 mg | DELAYED_RELEASE_TABLET | Freq: Every day | ORAL | Status: DC
  Administered 2017-12-20: 81 mg via ORAL
  Filled 2017-12-19: qty 1

## 2017-12-19 MED ORDER — ACETAMINOPHEN 325 MG PO TABS: 650.0000 mg | ORAL_TABLET | ORAL | Status: DC | PRN

## 2017-12-19 MED ORDER — METOPROLOL SUCCINATE ER 50 MG PO TB24
50.0000 mg | ORAL_TABLET | Freq: Every day | ORAL | Status: DC
  Administered 2017-12-19: 50 mg via ORAL
  Filled 2017-12-19: qty 1

## 2017-12-19 MED ORDER — GI COCKTAIL ~~LOC~~
30.0000 mL | Freq: Four times a day (QID) | ORAL | Status: DC | PRN
  Administered 2017-12-19: 30 mL via ORAL
  Filled 2017-12-19: qty 30

## 2017-12-19 MED ORDER — NITROGLYCERIN 0.4 MG SL SUBL
0.4000 mg | SUBLINGUAL_TABLET | SUBLINGUAL | Status: AC | PRN
  Administered 2017-12-19 (×3): 0.4 mg via SUBLINGUAL
  Filled 2017-12-19 (×3): qty 1

## 2017-12-19 MED ORDER — HEPARIN BOLUS VIA INFUSION
4000.0000 [IU] | Freq: Once | INTRAVENOUS | Status: AC
  Administered 2017-12-19: 4000 [IU] via INTRAVENOUS

## 2017-12-19 MED ORDER — CLOPIDOGREL BISULFATE 75 MG PO TABS
75.0000 mg | ORAL_TABLET | Freq: Every day | ORAL | Status: DC
  Administered 2017-12-20: 75 mg via ORAL
  Filled 2017-12-19: qty 1

## 2017-12-19 MED ORDER — PANTOPRAZOLE SODIUM 40 MG PO TBEC
40.0000 mg | DELAYED_RELEASE_TABLET | Freq: Two times a day (BID) | ORAL | Status: DC
  Administered 2017-12-19 – 2017-12-20 (×2): 40 mg via ORAL
  Filled 2017-12-19 (×2): qty 1

## 2017-12-19 MED ORDER — POLYVINYL ALCOHOL 1.4 % OP SOLN: 1.0000 [drp] | Freq: Every day | OPHTHALMIC | Status: DC | PRN

## 2017-12-19 MED ORDER — ISOSORBIDE MONONITRATE ER 60 MG PO TB24
30.0000 mg | ORAL_TABLET | Freq: Every day | ORAL | Status: DC
  Filled 2017-12-19: qty 1

## 2017-12-19 MED ORDER — ROSUVASTATIN CALCIUM 20 MG PO TABS
40.0000 mg | ORAL_TABLET | Freq: Every day | ORAL | Status: DC
  Administered 2017-12-19: 40 mg via ORAL
  Filled 2017-12-19: qty 2

## 2017-12-19 MED ORDER — MORPHINE SULFATE (PF) 2 MG/ML IV SOLN
2.0000 mg | INTRAVENOUS | Status: DC | PRN
  Administered 2017-12-19: 2 mg via INTRAVENOUS
  Filled 2017-12-19: qty 1

## 2017-12-19 MED ORDER — DICYCLOMINE HCL 10 MG PO CAPS: 10.0000 mg | ORAL_CAPSULE | Freq: Four times a day (QID) | ORAL | Status: DC | PRN

## 2017-12-19 MED ORDER — LISINOPRIL 5 MG PO TABS
2.5000 mg | ORAL_TABLET | Freq: Every day | ORAL | Status: DC
  Administered 2017-12-20: 2.5 mg via ORAL
  Filled 2017-12-19: qty 1

## 2017-12-19 NOTE — ED Provider Notes (Signed)
[facility] EMERGENCY DEPARTMENT Provider Note   CSN: [id number] Arrival date & time: 12/19/17  6503     History   Chief Complaint Chief Complaint  Patient presents with  . Chest Pain    HPI Anthony Hensley is a 57 y.o. male.  Patient states he started with chest pain around 5 AM today.  He has a history of one stent.   The history is provided by the patient. No language interpreter was used.  Chest Pain   This is a new problem. The current episode started 6 to 12 hours ago. The problem occurs constantly. The problem has not changed since onset.The pain is associated with exertion. The pain is present in the substernal region. The pain is at a severity of 8/10. Pertinent negatives include no abdominal pain, no back pain, no cough and no headaches.  Pertinent negatives for past medical history include no seizures.    Past Medical History:  Diagnosis Date  . Anginal pain ([place])   . Arthritis    " IN MY NECK & SHOULDERS "  . CAD (coronary artery disease)    DES to mid circumflex October 2016  . Diverticulosis   . Dyspnea     AT TIMES"  . GERD (gastroesophageal reflux disease)   . Hypercholesterolemia   . Hypertension   . NSTEMI (non-ST elevated myocardial infarction) [facility])    October 2016    Patient Active Problem List   Diagnosis Date Noted  . Coronary artery disease involving native heart with angina pectoris ([place]) 03/20/2017  . CAD (coronary artery disease) 03/20/2017  . Unstable angina ([place])   . Coronary artery disease involving native coronary artery of native heart with angina pectoris ([place]) 03/17/2017  . 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 Precordial chest pain   . Diarrhea 09/26/2016  . Enteritis 09/26/2016  . GERD (gastroesophageal reflux disease) 06/08/2016  . Constipation 06/08/2016  . Mucosal abnormality of stomach   . Mucosal abnormality of esophagus   . History of colonic polyps     . Diverticulosis of colon without hemorrhage   . Dysphagia 11/27/2015  . Rectal bleeding 11/27/2015  . Abdominal pain 11/27/2015  . Tobacco abuse 07/27/2015  . Chest pain at rest   . NSTEMI (non-ST elevated myocardial infarction) ([place])   . ACS (acute coronary syndrome) ([place]) 07/12/2015  . Hyperlipidemia 07/12/2015  . Chest pain 07/11/2015  . Essential hypertension 07/11/2015  . LUMBAR SPRAIN AND STRAIN 06/08/2009    Past Surgical History:  Procedure Laterality Date  . CARDIAC CATHETERIZATION N/A 07/13/2015   Procedure: Left Heart Cath and Coronary Angiography;  Surgeon: Burnell Blanks, MD;  Location: [place] [facility];  Service: Cardiovascular;  Laterality: N/A;  . CARDIAC CATHETERIZATION N/A 07/13/2015   PCI + DES to the mid circ. LVEF was normal at 65%  . COLONOSCOPY N/A 12/31/2015   Dr.Rourk- diverticulosis,24mm polyp in the splenic flexure, 52mm polyp in the splenic flexure, 40mm polyp in the sigmoid colon bx= traditional serrated adenoma  . ESOPHAGOGASTRODUODENOSCOPY N/A 12/31/2015   Dr.Rourk- esophagitis with no bleeding, diffuse moderately erythematous mucosa without bleeding was found in the entire examined stomach. stomach bx= slight chronic inflammation. esophagus bx= benign gastresophageal junction mucosa  . LEFT HEART CATH AND CORONARY ANGIOGRAPHY N/A 03/20/2017   Procedure: Left Heart Cath and Coronary Angiography;  Surgeon: Martinique, Peter M, MD;  Location: [place] [facility];  Service: Cardiovascular;  Laterality: N/A;  . MALONEY  DILATION N/A 12/31/2015   Procedure: Venia Minks DILATION;  Surgeon: [name], MD;  [facility];  Service: Endoscopy;  Laterality: N/A;       Home Medications    Prior to Admission medications   Medication Sig Start Date End Date Taking? Authorizing Provider  albuterol (PROVENTIL) (2.5 MG/3ML) 0.083% nebulizer solution Take 3 mLs by nebulization daily as needed for shortness of breath. 08/04/16  Yes [provider]   aspirin 81 MG EC tablet Take 1 tablet (81 mg total) by mouth daily. 07/14/15  Yes Lyda Jester M, PA-C  clopidogrel (PLAVIX) 75 MG tablet TAKE 1 TABLET BY MOUTH EVERY DAY WITH BREAKFAST 10/19/17  Yes Herminio Commons, MD  dicyclomine (BENTYL) 10 MG capsule Take 1 capsule (10 mg total) by mouth 4 (four) times daily as needed for spasms. 06/29/17  Yes [name], NP  Glycerin-Hypromellose-PEG 400 (VISINE TEARS OP) Apply 1 drop to eye daily as needed (dry eyes).    Yes [provider]  isosorbide mononitrate (IMDUR) 30 MG 24 hr tablet Take 1 tablet (30 mg total) by mouth daily. 06/06/17  Yes Herminio Commons, MD  lisinopril (PRINIVIL,ZESTRIL) 2.5 MG tablet TAKE 1 TABLET BY MOUTH daily 10/19/17  Yes Herminio Commons, MD  metoprolol succinate (TOPROL-XL) 50 MG 24 hr tablet Take 1 tablet (50 mg total) by mouth at bedtime. 04/28/17  Yes [name], NP  NITROSTAT 0.4 MG SL tablet Place 1 tablet under the tongue daily as needed for chest pain. 07/02/16  Yes [provider]  pantoprazole (PROTONIX) 40 MG tablet Take 1 tablet (40 mg total) by mouth 2 (two) times daily before a meal. 07/26/17  Yes [name], NP  PROAIR HFA 108 (90 Base) MCG/ACT inhaler Inhale 2 puffs into the lungs every 4 (four) hours as needed for shortness of breath. 08/01/16  Yes [provider]  rosuvastatin (CRESTOR) 40 MG tablet Take 1 tablet (40 mg total) by mouth daily at 6 PM. 06/06/17  Yes Herminio Commons, MD    Family History Family History  Problem Relation Age of Onset  . Heart attack Father 27       Deceased  . Colon cancer Neg Hx     Social History Social History   Tobacco Use  . Smoking status: Current Every Day Smoker    Packs/day: 0.75    Years: 35.00    Pack years: 26.25    Types: Cigarettes    Start date: 08/08/1976  . Smokeless tobacco: Never Used  Substance Use Topics  . Alcohol use: No    Alcohol/week: 0.0 oz  . Drug use: No     Allergies    Patient has no known allergies.   Review of Systems Review of Systems  Constitutional: Negative for appetite change and fatigue.  HENT: Negative for congestion, ear discharge and sinus pressure.   Eyes: Negative for discharge.  Respiratory: Negative for cough.   Cardiovascular: Positive for chest pain.  Gastrointestinal: Negative for abdominal pain and diarrhea.  Genitourinary: Negative for frequency and hematuria.  Musculoskeletal: Negative for back pain.  Skin: Negative for rash.  Neurological: Negative for seizures and headaches.  Psychiatric/Behavioral: Negative for hallucinations.     Physical Exam Updated Vital Signs BP 119/90   Pulse (!) 51   Temp 97.7 F (36.5 C) (Oral)   Resp 13   Ht 5\' 6"  (1.676 m)   Wt 77.1 kg (170 lb)   SpO2 95%   BMI  27.44 kg/m   Physical Exam  Constitutional: He is oriented to person, place, and time. He appears well-developed.  HENT:  Head: Normocephalic.  Eyes: Conjunctivae and EOM are normal. No scleral icterus.  Neck: Neck supple. No thyromegaly present.  Cardiovascular: Normal rate and regular rhythm. Exam reveals no gallop and no friction rub.  No murmur heard. Pulmonary/Chest: No stridor. He has no wheezes. He has no rales. He exhibits no tenderness.  Abdominal: He exhibits no distension. There is no tenderness. There is no rebound.  Musculoskeletal: Normal range of motion. He exhibits no edema.  Lymphadenopathy:    He has no cervical adenopathy.  Neurological: He is oriented to person, place, and time. He exhibits normal muscle tone. Coordination normal.  Skin: No rash noted. No erythema.  Psychiatric: He has a normal mood and affect. His behavior is normal.  Nursing note and vitals reviewed.    ED Treatments / Results  Labs (all labs ordered are listed, but only abnormal results are displayed) Labs Reviewed  BASIC METABOLIC PANEL - Abnormal; Notable for the following components:      Result Value   Glucose, Bld 102  (*)    BUN 23 (*)    All other components within normal limits  CBC  TROPONIN I  APTT  PROTIME-INR  PROTIME-INR  APTT  HEPARIN LEVEL (UNFRACTIONATED)  I-STAT TROPONIN, ED    EKG  EKG Interpretation  Date/Time:  Tuesday December 19 2017 09:51:36 EDT Ventricular Rate:  56 PR Interval:    QRS Duration: 115 QT Interval:  424 QTC Calculation: 410 R Axis:   80 Text Interpretation:  Sinus rhythm Incomplete right bundle branch block Confirmed by Milton Ferguson [phone]) on 12/19/2017 11:06:21 AM       Radiology Dg Chest 2 View  Result Date: 12/19/2017 CLINICAL DATA:  Chest pain and shortness of breath beginning today. History coronary stent. EXAM: CHEST - 2 VIEW COMPARISON:  03/20/2017 FINDINGS: Artifact overlies the chest. Mediastinal shadows are normal. The pulmonary vascularity is normal. Lungs are clear. No effusions. No bone abnormality. Coronary stent visible in the left system. IMPRESSION: No active disease.  Coronary stent. Electronically Signed   By: Nelson Chimes M.D.   On: 12/19/2017 10:36    Procedures Procedures (including critical care time)  Medications Ordered in ED Medications  nitroGLYCERIN (NITRODUR - Dosed in mg/24 hr) patch 0.4 mg (not administered)  heparin bolus via infusion 4,000 Units (not administered)  heparin ADULT infusion 100 units/mL (25000 units/232mL sodium chloride 0.45%) (not administered)  nitroGLYCERIN (NITROSTAT) SL tablet 0.4 mg (0.4 mg Sublingual Given 12/19/17 1201)  morphine 4 MG/ML injection 4 mg (4 mg Intravenous Given 12/19/17 1247)     Initial Impression / Assessment and Plan / ED Course  I have reviewed the triage vital signs and the nursing notes.  Pertinent labs & imaging results that were available during my care of the patient were reviewed by me and considered in my medical decision making (see chart for details).     CRITICAL CARE Performed by: Milton Ferguson Total critical care time: 40 minutes Critical care time was  exclusive of separately billable procedures and treating other patients. Critical care was necessary to treat or prevent imminent or life-threatening deterioration. Critical care was time spent personally by me on the following activities: development of treatment plan with patient and/or surrogate as well as nursing, discussions with consultants, evaluation of patient's response to treatment, examination of patient, obtaining history from patient or surrogate, ordering and performing treatments  and interventions, ordering and review of laboratory studies, ordering and review of radiographic studies, pulse oximetry and re-evaluation of patient's condition. Patient with chest pain and known coronary disease.  Cardiology has been consult and recommend admission to medicine and cardiology will consult. Final Clinical Impressions(s) / ED Diagnoses   Final diagnoses:  Unstable angina pectoris [facility])    ED Discharge Orders    None       Milton Ferguson, MD 12/19/17 1327

## 2017-12-19 NOTE — H&P (Signed)
History and Physical    Anthony Hensley ERX:[id number] DOB: Oct 08, 1961 DOA: 12/19/2017  PCP: Jake Samples, PA-C   I have briefly reviewed patients previous medical reports in [facility].  Patient coming from: Home  Chief Complaint: Chest pain  HPI: Anthony Hensley is a 57 year old with past medical history significant for hypertension, hyperlipidemia, gastroesophageal reflux disease, tobacco abuse, nonobstructive coronary artery disease and prior N STEMI with DES (October 2016); who presented to the emergency department complaining of mid/left-sided chest pain that started around 5 AM this morning and woke him up from sleep; patient's pain had an intensity 7-8/10, radiated to the right side of his chest and the base of his neck, associated with mild shortness of breath and diaphoresis, no nausea or vomiting; patient reported nothing makes the pain better or worse.  He expressed that the pain is very similar in presentation to the pain that he had with his prior MI.  No fever, no coughing, no sick contacts, no chills, no dysuria, no hematemesis, no hematochezia, no focal weakness or any other complaints.   Of note, patient reports that the pain lasted about 30 minutes when present,  then comes and goes but never completely resolved some improvement in ED with SL NTG.  ED Course: Chest x-ray without acute cardiopulmonary process, EKG without acute ischemic process, first troponin negative.  Nitroglycerin and morphine given to the patient.  Cardiology consulted and [facility] contacted to bring patient into the hospital for further evaluation and treatment.  Review of Systems:  All other systems reviewed and apart from HPI, are negative.  Past Medical History:  Diagnosis Date  . Anginal pain ([place])   . Arthritis    " IN MY NECK & SHOULDERS "  . CAD (coronary artery disease)    DES to mid circumflex October 2016  . Diverticulosis   . Dyspnea     AT TIMES"  . GERD (gastroesophageal reflux  disease)   . Hypercholesterolemia   . Hypertension   . NSTEMI (non-ST elevated myocardial infarction) [facility])    October 2016    Past Surgical History:  Procedure Laterality Date  . CARDIAC CATHETERIZATION N/A 07/13/2015   Procedure: Left Heart Cath and Coronary Angiography;  Surgeon: Burnell Blanks, MD;  Location: [place] [facility];  Service: Cardiovascular;  Laterality: N/A;  . CARDIAC CATHETERIZATION N/A 07/13/2015   PCI + DES to the mid circ. LVEF was normal at 65%  . COLONOSCOPY N/A 12/31/2015   Dr.Rourk- diverticulosis,72mm polyp in the splenic flexure, 45mm polyp in the splenic flexure, 17mm polyp in the sigmoid colon bx= traditional serrated adenoma  . ESOPHAGOGASTRODUODENOSCOPY N/A 12/31/2015   Dr.Rourk- esophagitis with no bleeding, diffuse moderately erythematous mucosa without bleeding was found in the entire examined stomach. stomach bx= slight chronic inflammation. esophagus bx= benign gastresophageal junction mucosa  . LEFT HEART CATH AND CORONARY ANGIOGRAPHY N/A 03/20/2017   Procedure: Left Heart Cath and Coronary Angiography;  Surgeon: Martinique, Peter M, MD;  Location: [place] [facility];  Service: Cardiovascular;  Laterality: N/A;  . Venia Minks DILATION N/A 12/31/2015   Procedure: Venia Minks DILATION;  Surgeon: [name], MD;  [facility];  Service: Endoscopy;  Laterality: N/A;    Social History  reports that he has been smoking cigarettes.  He started smoking about 41 years ago. He has a 26.25 pack-year smoking history. he has never used smokeless tobacco. He reports that he does not drink alcohol or use drugs.  No Known Allergies  Family History  Problem Relation Age of Onset  . Heart attack Father 75       Deceased  . Colon cancer Neg Hx     Prior to Admission medications   Medication Sig Start Date End Date Taking? Authorizing Provider  albuterol (PROVENTIL) (2.5 MG/3ML) 0.083% nebulizer solution Take 3 mLs by nebulization daily as needed for  shortness of breath. 08/04/16  Yes [provider]  aspirin 81 MG EC tablet Take 1 tablet (81 mg total) by mouth daily. 07/14/15  Yes Lyda Jester M, PA-C  clopidogrel (PLAVIX) 75 MG tablet TAKE 1 TABLET BY MOUTH EVERY DAY WITH BREAKFAST 10/19/17  Yes Herminio Commons, MD  dicyclomine (BENTYL) 10 MG capsule Take 1 capsule (10 mg total) by mouth 4 (four) times daily as needed for spasms. 06/29/17  Yes [name], NP  Glycerin-Hypromellose-PEG 400 (VISINE TEARS OP) Apply 1 drop to eye daily as needed (dry eyes).    Yes [provider]  isosorbide mononitrate (IMDUR) 30 MG 24 hr tablet Take 1 tablet (30 mg total) by mouth daily. 06/06/17  Yes Herminio Commons, MD  lisinopril (PRINIVIL,ZESTRIL) 2.5 MG tablet TAKE 1 TABLET BY MOUTH daily 10/19/17  Yes Herminio Commons, MD  metoprolol succinate (TOPROL-XL) 50 MG 24 hr tablet Take 1 tablet (50 mg total) by mouth at bedtime. 04/28/17  Yes [name], NP  NITROSTAT 0.4 MG SL tablet Place 1 tablet under the tongue daily as needed for chest pain. 07/02/16  Yes [provider]  pantoprazole (PROTONIX) 40 MG tablet Take 1 tablet (40 mg total) by mouth 2 (two) times daily before a meal. 07/26/17  Yes [name], NP  PROAIR HFA 108 (90 Base) MCG/ACT inhaler Inhale 2 puffs into the lungs every 4 (four) hours as needed for shortness of breath. 08/01/16  Yes [provider]  rosuvastatin (CRESTOR) 40 MG tablet Take 1 tablet (40 mg total) by mouth daily at 6 PM. 06/06/17  Yes Herminio Commons, MD    Physical Exam: Vitals:   12/19/17 1230 12/19/17 1300 12/19/17 1330 12/19/17 1409  BP: 119/90 (!) 153/98 139/87 (!) 156/88  Pulse: (!) 51 68 (!) 52 (!) 51  Resp: 13 12 12 16   Temp:    97.7 F (36.5 C)  TempSrc:    Oral  SpO2: 95% 96% 96% 97%  Weight:    77.1 kg (170 lb)  Height:    5\' 6"  (1.676 m)    Constitutional: Afebrile, in no acute distress and currently reporting being chest pain-free.  No  nausea or vomiting. Eyes: PERRLA, lids and conjunctivae normal, no icterus, no nystagmus. ENMT: Mucous membranes are moist. Posterior pharynx clear of any exudate or lesions.  No thrush Neck: supple, no masses, no thyromegaly, no JVD Respiratory: clear to auscultation bilaterally, no wheezing, no crackles. Normal respiratory effort. No accessory muscle use.  Positive for scattered rhonchi. Cardiovascular: S1 & S2 heard, regular rate and rhythm, no murmurs / rubs / gallops. No extremity edema. 2+ pedal pulses.  Abdomen: No distension, no tenderness, no masses palpated. No hepatosplenomegaly. Bowel sounds normal.  Musculoskeletal: no clubbing / cyanosis. No joint deformity upper and lower extremities. Good ROM, no contractures. Normal muscle tone.  Skin: no rashes, lesions, ulcers. No induration Neurologic: CN 2-12 grossly intact. Sensation intact, DTR normal. Strength 5/5 in all 4 limbs.  Psychiatric: Normal judgment and insight. Alert and oriented x 3. Normal mood.    Labs on Admission: I have personally  reviewed following labs and imaging studies  CBC: Recent Labs  Lab 12/19/17 1021  WBC 4.9  HGB 14.3  HCT 45.7  MCV 94.6  PLT 202   Basic Metabolic Panel: Recent Labs  Lab 12/19/17 1021  NA 141  K 4.3  CL 105  CO2 28  GLUCOSE 102*  BUN 23*  CREATININE 0.83  CALCIUM 9.1   Coagulation Profile: Recent Labs  Lab 12/19/17 1047  INR 0.90   Cardiac Enzymes: Recent Labs  Lab 12/19/17 1334  TROPONINI <0.03    Radiological Exams on Admission: Dg Chest 2 View  Result Date: 12/19/2017 CLINICAL DATA:  Chest pain and shortness of breath beginning today. History coronary stent. EXAM: CHEST - 2 VIEW COMPARISON:  03/20/2017 FINDINGS: Artifact overlies the chest. Mediastinal shadows are normal. The pulmonary vascularity is normal. Lungs are clear. No effusions. No bone abnormality. Coronary stent visible in the left system. IMPRESSION: No active disease.  Coronary stent.  Electronically Signed   By: Nelson Chimes M.D.   On: 12/19/2017 10:36    EKG:  No acute ischemic changes appreciated, regular rate and rhythm, unchanged from previous tracing. Positive incomplete right bundle branch block.  Assessment/Plan 1-chest pain: With concern for unstable angina -Heart score 4-5 -Patient risk factors for cardiovascular disease includes: Hypertension, hyperlipidemia, tobacco abuse, prior history of coronary artery disease with DES and nonobstructive coronary artery disease appreciated during cath in 2018. -Initial troponin and EKG without suggestion of acute ischemic process -Patient history very typical and with concerns for unstable angina. -Patient started on heparin drip -Continue aspirin, Plavix and beta-blocker -Will also continue low-dose lisinopril and IMDUR -cycle troponin and EKG -echo ordered -cardiology consulted; NTG paste ordered by cardiology -NPO after midnight in case any procedure or testing needed  -will check lipid profile and A1C  2-Benign essential HTN -well controlled -will continue current antihypertensive regimen   3-Hyperlipidemia -continue Statins  -checking lipid profile   4-Tobacco abuse -cessation counseling provided  -nicotine patch ordered   5-GERD (gastroesophageal reflux disease) -continue PPI -PRN GI cocktail also ordered   6-dry eyes syndrome -will continue PRN liquifim tears   7-hx of COPD -advice to quit smoking -continue PRN Duoneb  -no wheezing, no SOB   Time: 70 minutes   DVT prophylaxis: Patient is receiving heparin drip which will fulfill DVT prophylaxis. Code Status: Full code Family Communication: No family at bedside Disposition Plan: Anticipate discharge back home once medically stable. Consults called: Cardiology   Admission status: Telemetry, observation, length of stay less than 2 midnights,   Barton Dubois MD [facility] Pager [phone]  If 7PM-7AM, please contact  night-coverage [email or web address] Password [facility]  12/19/2017, 4:34 PM

## 2017-12-19 NOTE — Consult Note (Signed)
Cardiology Consultation:   Patient ID: Anthony Hensley; [id number]; Dec 10, 1960   Admit date: 12/19/2017 Date of Consult: 12/19/2017  Primary Care Provider: Scherrie Bateman Primary Cardiologist: Bronson Ing Primary Electrophysiologist:  n/a   Patient Profile:   Anthony Hensley is a 57 y.o. male with a hx of history of CAD who is being seen today for the evaluation of chest pain at the request of Dr Roderic Palau.  History of Present Illness:   Mr. Anthony Hensley 57 yo male history of CAD with DES to LCX in setting of NSTEMI 07/2015, HTN, HL. Ongoing chest pain over the last year. Nuclear stress Jan 2018 with some peri-infarct ischemia. Admission 03/2017 with chest pain, repeat cath at that time showed 70% LCX disease (small vessel) overall unchanged, patient LCX sent. Managed medically. Some of his chest pains thought to be related to GERD.   Presents with chest pain that started around 530AM. Initially dull pain that became sharp, 7/10 with some SOB. Not positional. Would last for about 30 minutes, then come and go. Better with SL NG. First episode of chest pain in 3-4 months  K 4.3, Cr 0.83, Hgb 14.3, plt 191,  Trop neg x 1.  CXR no acute process EKG sinus brady no ischemic changes  Past Medical History:  Diagnosis Date  . Anginal pain ([place])   . Arthritis    " IN MY NECK & SHOULDERS "  . CAD (coronary artery disease)    DES to mid circumflex October 2016  . Diverticulosis   . Dyspnea     AT TIMES"  . GERD (gastroesophageal reflux disease)   . Hypercholesterolemia   . Hypertension   . NSTEMI (non-ST elevated myocardial infarction) [facility])    October 2016    Past Surgical History:  Procedure Laterality Date  . CARDIAC CATHETERIZATION N/A 07/13/2015   Procedure: Left Heart Cath and Coronary Angiography;  Surgeon: Burnell Blanks, MD;  Location: [place] [facility];  Service: Cardiovascular;  Laterality: N/A;  . CARDIAC CATHETERIZATION N/A 07/13/2015   PCI + DES to the mid circ. LVEF  was normal at 65%  . COLONOSCOPY N/A 12/31/2015   Dr.Rourk- diverticulosis,80mm polyp in the splenic flexure, 24mm polyp in the splenic flexure, 71mm polyp in the sigmoid colon bx= traditional serrated adenoma  . ESOPHAGOGASTRODUODENOSCOPY N/A 12/31/2015   Dr.Rourk- esophagitis with no bleeding, diffuse moderately erythematous mucosa without bleeding was found in the entire examined stomach. stomach bx= slight chronic inflammation. esophagus bx= benign gastresophageal junction mucosa  . LEFT HEART CATH AND CORONARY ANGIOGRAPHY N/A 03/20/2017   Procedure: Left Heart Cath and Coronary Angiography;  Surgeon: Martinique, Peter M, MD;  Location: [place] [facility];  Service: Cardiovascular;  Laterality: N/A;  . Venia Minks DILATION N/A 12/31/2015   Procedure: Venia Minks DILATION;  Surgeon: [name], MD;  [facility];  Service: Endoscopy;  Laterality: N/A;       Inpatient Medications: Scheduled Meds: .  morphine injection  4 mg Intravenous Once   Continuous Infusions:  PRN Meds:   Allergies:   No Known Allergies  Social History:   Social History   Socioeconomic History  . Marital status: Married    Spouse name: Not on file  . Number of children: Not on file  . Years of education: Not on file  . Highest education level: Not on file  Social Needs  . Financial resource strain: Not on file  . Food insecurity - worry: Not on file  . Food insecurity -  inability: Not on file  . Transportation needs - medical: Not on file  . Transportation needs - non-medical: Not on file  Occupational History  . 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 Smoking status: Current Every Day Smoker    Packs/day: 0.75    Years: 35.00    Pack years: 26.25    Types: Cigarettes    Start date: 08/08/1976  . Smokeless tobacco: Never Used  Substance and Sexual Activity  . Alcohol use: No    Alcohol/week: 0.0 oz  . Drug use: No  . Sexual  activity: Not on file  Other Topics Concern  . Not on file  Social History Narrative  . Not on file    Family History:    Family History  Problem Relation Age of Onset  . Heart attack Father 68       Deceased  . Colon cancer Neg Hx      ROS:  Please see the history of present illness.   All other ROS reviewed and negative.     Physical Exam/Data:   Vitals:   12/19/17 1130 12/19/17 1133 12/19/17 1143 12/19/17 1200  BP: (!) 124/100 (!) 113/98 (!) 136/91 (!) 127/93  Pulse: (!) 57 (!) 58 62 (!) 54  Resp: 14  18 13   Temp:      TempSrc:      SpO2: 96% 96% 98% 97%  Weight:      Height:       No intake or output data in the 24 hours ending 12/19/17 1233 Filed Weights   12/19/17 0956  Weight: 170 lb (77.1 kg)   Body mass index is 27.44 kg/m.  HEENT: normal Lymph: no adenopathy Neck: no JVD Endocrine:  No thryomegaly Cardiac:  normal S1, S2; RRR; no murmur Lungs:  clear to auscultation bilaterally, no wheezing, rhonchi or rales  Abd: soft, nontender, no hepatomegaly  Ext: no edema Musculoskeletal:  Left chest wall tender to palpation Skin: warm and dry  Neuro:  CNs 2-12 intact, no focal abnormalities noted Psych:  Normal affect      Laboratory Data:  Chemistry Recent Labs  Lab 12/19/17 1021  NA 141  K 4.3  CL 105  CO2 28  GLUCOSE 102*  BUN 23*  CREATININE 0.83  CALCIUM 9.1  GFRNONAA >60  GFRAA >60  ANIONGAP 8    No results for input(s): PROT, ALBUMIN, AST, ALT, ALKPHOS, BILITOT in the last 168 hours. Hematology Recent Labs  Lab 12/19/17 1021  WBC 4.9  RBC 4.83  HGB 14.3  HCT 45.7  MCV 94.6  MCH 29.6  MCHC 31.3  RDW 12.7  PLT 191   Cardiac EnzymesNo results for input(s): TROPONINI in the last 168 hours.  Recent Labs  Lab 12/19/17 1027  TROPIPOC 0.00    BNPNo results for input(s): BNP, PROBNP in the last 168 hours.  DDimer No results for input(s): DDIMER in the last 168 hours.  Radiology/Studies:  Dg Chest 2 View  Result Date:  12/19/2017 CLINICAL DATA:  Chest pain and shortness of breath beginning today. History coronary stent. EXAM: CHEST - 2 VIEW COMPARISON:  03/20/2017 FINDINGS: Artifact overlies the chest. Mediastinal shadows are normal. The pulmonary vascularity is normal. Lungs are clear. No effusions. No bone abnormality. Coronary stent visible in the left system. IMPRESSION: No active disease.  Coronary stent. Electronically Signed   By: Nelson Chimes M.D.   On: 12/19/2017 10:36  Assessment and Plan:   1. CAD/chest pain History of CAD with prior stent. Intermittent chest pain over the last few years, last cath with stable anatomy. - chest pain free for 3-4 months, recurrence of symptoms today - symptosm are mixed. Of note he does have some tenderness to palpation on exam - initial EKG and enzymes are negative.  - recommend observation overnight with cycling of enzymes, EKGs. Obtain echo. NPO at midnight incase further testing indicated - continue home cardiac meds. Im not sure why he is still on DAPT, perhaps because he has had intermittent chest pain over the years. Continue at this time in setting of chest pain, defer to primary cardiologist at f/u - if true unstable angina timi risk score 4, will start hep gtt as workup is ongoing.     For questions or updates, please contact [place] Please consult [email or web address] for contact info under Cardiology/STEMI.   Merrily Pew, MD  12/19/2017 12:34 PM

## 2017-12-19 NOTE — ED Triage Notes (Signed)
Pt c/o sharp intermittent chest pain that "jumps from the left to the right side" that started this morning upon waking. Pt also reports SOB and slight dizziness. Denies n/v. Family reports diaphoresis. Pt has hx of MI with 1 stent placed 2 years ago. Pt reports the chest pain feels similar to when he had MI.

## 2017-12-19 NOTE — Progress Notes (Signed)
ANTICOAGULATION CONSULT NOTE - Initial Consult  Pharmacy Consult for HEPARIN Indication: chest pain/ACS  No Known Allergies  Patient Measurements: Height: 5\' 6"  (167.6 cm) Weight: 170 lb (77.1 kg) IBW/kg (Calculated) : 63.8  HEPARIN DW (KG): 77.1  Vital Signs: Temp: 97.7 F (36.5 C) (03/12 0955) Temp Source: Oral (03/12 0955) BP: 119/90 (03/12 1230) Pulse Rate: 51 (03/12 1230)  Labs: Recent Labs    12/19/17 1021  HGB 14.3  HCT 45.7  PLT 191  CREATININE 0.83   Estimated Creatinine Clearance: 99.4 mL/min (by C-G formula based on SCr of 0.83 mg/dL).  Medical History: Past Medical History:  Diagnosis Date  . Anginal pain ([place])   . Arthritis    " IN MY NECK & SHOULDERS "  . CAD (coronary artery disease)    DES to mid circumflex October 2016  . Diverticulosis   . Dyspnea     AT TIMES"  . GERD (gastroesophageal reflux disease)   . Hypercholesterolemia   . Hypertension   . NSTEMI (non-ST elevated myocardial infarction) [facility])    October 2016   Medications:   (Not in a hospital admission)  Assessment: 57 y.o. male with a hx of history of CAD who is being seen today for the evaluation of chest pain at the request of Dr Roderic Palau.  Pt on Plavix PTA but no anticoagulants.    Goal of Therapy:  Heparin level 0.3-0.7 units/ml Monitor platelets by anticoagulation protocol: Yes   Plan:   Heparin 4000 units IV now x 1  Heparin infusion at 1000 units/hr  Heparin level in 6-8 hrs then daily  CBC daily while on Heparin   [place] Crane, Sheenah Dimitroff A 12/19/2017,1:20 PM

## 2017-12-19 NOTE — Progress Notes (Signed)
*  PRELIMINARY RESULTS* Echocardiogram 2D Echocardiogram has been performed.  Anthony Hensley 12/19/2017, 3:32 PM

## 2017-12-19 NOTE — ED Notes (Signed)
Chest pain since this am. Complains of pain on left mid to right side. Nothing makes better or worse. Sob. Pt states had chest cold last week, denies productive cough.

## 2017-12-20 DIAGNOSIS — R0789 Other chest pain: Secondary | ICD-10-CM | POA: Diagnosis not present

## 2017-12-20 DIAGNOSIS — I25119 Atherosclerotic heart disease of native coronary artery with unspecified angina pectoris: Secondary | ICD-10-CM

## 2017-12-20 DIAGNOSIS — Z720 Tobacco use: Secondary | ICD-10-CM | POA: Diagnosis not present

## 2017-12-20 DIAGNOSIS — E782 Mixed hyperlipidemia: Secondary | ICD-10-CM

## 2017-12-20 LAB — LIPID PANEL
Cholesterol: 111 mg/dL (ref 0–200)
HDL: 40 mg/dL — AB (ref >40)
LDL CALC: 52 mg/dL (ref 0–99)
Total CHOL/HDL Ratio: 2.8 RATIO
Triglycerides: 95 mg/dL (ref <150)
VLDL: 19 mg/dL (ref 0–40)

## 2017-12-20 LAB — CBC
HEMATOCRIT: 42.9 % (ref 39.0–52.0)
Hemoglobin: 13.8 g/dL (ref 13.0–17.0)
MCH: 30.3 pg (ref 26.0–34.0)
MCHC: 32.2 g/dL (ref 30.0–36.0)
MCV: 94.3 fL (ref 78.0–100.0)
PLATELETS: 167 10*3/uL (ref 150–400)
RBC: 4.55 MIL/uL (ref 4.22–5.81)
RDW: 12.7 % (ref 11.5–15.5)
WBC: 6.8 10*3/uL (ref 4.0–10.5)

## 2017-12-20 LAB — HEPARIN LEVEL (UNFRACTIONATED): Heparin Unfractionated: 0.18 IU/mL — ABNORMAL LOW (ref 0.30–0.70)

## 2017-12-20 MED ORDER — NITROSTAT 0.4 MG SL SUBL: 0.4000 mg | SUBLINGUAL_TABLET | SUBLINGUAL | 1 refills | Status: DC | PRN

## 2017-12-20 MED ORDER — HEPARIN BOLUS VIA INFUSION
2000.0000 [IU] | Freq: Once | INTRAVENOUS | Status: DC
  Filled 2017-12-20: qty 2000

## 2017-12-20 MED ORDER — ISOSORBIDE MONONITRATE ER 60 MG PO TB24: 60.0000 mg | ORAL_TABLET | Freq: Every day | ORAL | 1 refills | Status: DC

## 2017-12-20 MED ORDER — ISOSORBIDE MONONITRATE ER 60 MG PO TB24
60.0000 mg | ORAL_TABLET | Freq: Every day | ORAL | Status: DC
  Administered 2017-12-20: 60 mg via ORAL

## 2017-12-20 NOTE — Progress Notes (Signed)
[facility] for HEPARIN Indication: chest pain/ACS  No Known Allergies  Patient Measurements: Height: 5\' 6"  (167.6 cm) Weight: 170 lb (77.1 kg) IBW/kg (Calculated) : 63.8  HEPARIN DW (KG): 77.1  Vital Signs: Temp: 97.9 F (36.6 C) (03/13 0400) Temp Source: Oral (03/13 0400) BP: 103/80 (03/13 0400) Pulse Rate: 55 (03/13 0400)  Labs: Recent Labs    12/19/17 1021 12/19/17 1047  12/19/17 1655 12/19/17 2011 12/19/17 2247 12/20/17 0510  HGB 14.3  --   --   --   --   --  13.8  HCT 45.7  --   --   --   --   --  42.9  PLT 191  --   --   --   --   --  167  APTT  --  27  --   --   --   --   --   LABPROT  --  12.1  --   --   --   --   --   INR  --  0.90  --   --   --   --   --   HEPARINUNFRC  --   --   --   --  0.13*  --  0.18*  CREATININE 0.83  --   --   --   --   --   --   TROPONINI  --   --    < > <0.03 <0.03 <0.03  --    < > = values in this interval not displayed.   Estimated Creatinine Clearance: 99.4 mL/min (by C-G formula based on SCr of 0.83 mg/dL).  Medical History: Past Medical History:  Diagnosis Date  . Anginal pain ([place])   . Arthritis    " IN MY NECK & SHOULDERS "  . CAD (coronary artery disease)    DES to mid circumflex October 2016  . Diverticulosis   . Dyspnea     AT TIMES"  . GERD (gastroesophageal reflux disease)   . Hypercholesterolemia   . Hypertension   . NSTEMI (non-ST elevated myocardial infarction) [facility])    October 2016   Medications:  Medications Prior to Admission  Medication Sig Dispense Refill Last Dose  . albuterol (PROVENTIL) (2.5 MG/3ML) 0.083% nebulizer solution Take 3 mLs by nebulization daily as needed for shortness of breath.  3 Past Week at Unknown time  . aspirin 81 MG EC tablet Take 1 tablet (81 mg total) by mouth daily. 30 tablet  12/19/2017 at Unknown time  . clopidogrel (PLAVIX) 75 MG tablet TAKE 1 TABLET BY MOUTH EVERY DAY WITH BREAKFAST 30 tablet 0 12/19/2017 at 0530  . dicyclomine  (BENTYL) 10 MG capsule Take 1 capsule (10 mg total) by mouth 4 (four) times daily as needed for spasms. 90 capsule 1 12/19/2017 at Unknown time  . Glycerin-Hypromellose-PEG 400 (VISINE TEARS OP) Apply 1 drop to eye daily as needed (dry eyes).    Past Month at Unknown time  . isosorbide mononitrate (IMDUR) 30 MG 24 hr tablet Take 1 tablet (30 mg total) by mouth daily. 90 tablet 2 12/19/2017 at Unknown time  . lisinopril (PRINIVIL,ZESTRIL) 2.5 MG tablet TAKE 1 TABLET BY MOUTH daily 30 tablet 0 12/19/2017 at Unknown time  . metoprolol succinate (TOPROL-XL) 50 MG 24 hr tablet Take 1 tablet (50 mg total) by mouth at bedtime. 90 tablet 3 12/18/2017 at 1900  . NITROSTAT 0.4 MG SL tablet Place 1 tablet under the tongue  daily as needed for chest pain.  1 unknown  . pantoprazole (PROTONIX) 40 MG tablet Take 1 tablet (40 mg total) by mouth 2 (two) times daily before a meal. 180 tablet 1 12/19/2017 at Unknown time  . PROAIR HFA 108 (90 Base) MCG/ACT inhaler Inhale 2 puffs into the lungs every 4 (four) hours as needed for shortness of breath.  3 12/19/2017 at Unknown time  . rosuvastatin (CRESTOR) 40 MG tablet Take 1 tablet (40 mg total) by mouth daily at 6 PM. 90 tablet 2 12/19/2017 at Unknown time   Assessment: 57 y.o. male with a hx of history of CAD who is being seen today for the evaluation of chest pain at the request of Dr Roderic Palau.  Pt on Plavix PTA but no anticoagulants.  Heparin level below goal.    Goal of Therapy:  Heparin level 0.3-0.7 units/ml Monitor platelets by anticoagulation protocol: Yes   Plan:   Heparin 2000 units IV now x 1  Increase Heparin infusion to 1300 units/hr  Heparin level in 6-8 hrs then daily  CBC daily while on Heparin   [place] Crane, Lillee Mooneyhan A 12/20/2017,7:35 AM

## 2017-12-20 NOTE — Progress Notes (Signed)
Attempted to call pharmacist on call about heparin drip.  No answer.  Patient currently stable with no complaints of chest pain at this time. Will continue to monitor patient.

## 2017-12-20 NOTE — Progress Notes (Addendum)
Progress Note  Patient Name: Anthony Hensley Date of Encounter: 12/20/2017  Primary Cardiologist: Kate Sable, MD   Subjective   Denies any recurrent chest pain overnight or this morning. Reports feeling back to baseline. No dyspnea or palpitations.   Inpatient Medications    Scheduled Meds: . aspirin EC  81 mg Oral Daily  . clopidogrel  75 mg Oral Daily  . isosorbide mononitrate  60 mg Oral Daily  . lisinopril  2.5 mg Oral Daily  . metoprolol succinate  50 mg Oral QHS  . nicotine  14 mg Transdermal Daily  . nitroGLYCERIN  0.4 mg Transdermal Daily  . pantoprazole  40 mg Oral BID AC  . rosuvastatin  40 mg Oral q1800    PRN Meds: acetaminophen, dicyclomine, gi cocktail, morphine injection, ondansetron (ZOFRAN) IV, polyvinyl alcohol   Vital Signs    Vitals:   12/19/17 1600 12/19/17 1937 12/19/17 2000 12/20/17 0400  BP: 131/77  129/84 103/80  Pulse: (!) 55  61 (!) 55  Resp: 17  20 14   Temp: 98.9 F (37.2 C)  98.2 F (36.8 C) 97.9 F (36.6 C)  TempSrc: Oral  Oral Oral  SpO2: 96% 96% 94% 100%  Weight:      Height:        Intake/Output Summary (Last 24 hours) at 12/20/2017 0916 Last data filed at 12/20/2017 0300 Gross per 24 hour  Intake 450.5 ml  Output -  Net 450.5 ml   Filed Weights   12/19/17 0956 12/19/17 1409  Weight: 170 lb (77.1 kg) 170 lb (77.1 kg)    Telemetry    NSR, HR in 60's to 70's.  - Personally Reviewed  ECG    Sinus bradycardia, HR 56, with incomplete RBBB - Personally Reviewed  Physical Exam   General: Well developed, well nourished Caucasian male appearing in no acute distress. Head: Normocephalic, atraumatic.  Neck: Supple without bruits, JVD not elevated. Lungs:  Resp regular and unlabored, CTA. Heart: RRR, S1, S2, no S3, S4, or murmur; no rub. Abdomen: Soft, non-tender, non-distended with normoactive bowel sounds. No hepatomegaly. No rebound/guarding. No obvious abdominal masses. Extremities: No clubbing, cyanosis, or lower  extremity edema. Distal pedal pulses are 2+ bilaterally. Neuro: Alert and oriented X 3. Moves all extremities spontaneously. Psych: Normal affect.  Labs    Chemistry Recent Labs  Lab 12/19/17 1021  NA 141  K 4.3  CL 105  CO2 28  GLUCOSE 102*  BUN 23*  CREATININE 0.83  CALCIUM 9.1  GFRNONAA >60  GFRAA >60  ANIONGAP 8     Hematology Recent Labs  Lab 12/19/17 1021 12/20/17 0510  WBC 4.9 6.8  RBC 4.83 4.55  HGB 14.3 13.8  HCT 45.7 42.9  MCV 94.6 94.3  MCH 29.6 30.3  MCHC 31.3 32.2  RDW 12.7 12.7  PLT 191 167    Cardiac Enzymes Recent Labs  Lab 12/19/17 1334 12/19/17 1655 12/19/17 2011 12/19/17 2247  TROPONINI <0.03 <0.03 <0.03 <0.03    Recent Labs  Lab 12/19/17 1027  TROPIPOC 0.00     BNPNo results for input(s): BNP, PROBNP in the last 168 hours.   DDimer No results for input(s): DDIMER in the last 168 hours.   Radiology    Dg Chest 2 View  Result Date: 12/19/2017 CLINICAL DATA:  Chest pain and shortness of breath beginning today. History coronary stent. EXAM: CHEST - 2 VIEW COMPARISON:  03/20/2017 FINDINGS: Artifact overlies the chest. Mediastinal shadows are normal. The pulmonary vascularity is normal.  Lungs are clear. No effusions. No bone abnormality. Coronary stent visible in the left system. IMPRESSION: No active disease.  Coronary stent. Electronically Signed   By: Nelson Chimes M.D.   On: 12/19/2017 10:36    Cardiac Studies   Echocardiogram: 12/19/2017 Study Conclusions  - Left ventricle: The cavity size was normal. Wall thickness was   increased in a pattern of mild LVH. Systolic function was normal.   The estimated ejection fraction was in the range of 55% to 60%.   Wall motion was normal; there were no regional wall motion   abnormalities. - Aortic valve: Mildly calcified annulus. Trileaflet; mildly   thickened leaflets. There was trivial regurgitation. Valve area   (VTI): 2.51 cm^2. Valve area (Vmax): 2.89 cm^2.   Cardiac  Catheterization: 03/2017  Prox RCA lesion, 20 %stenosed.  Mid Cx lesion, 0 %stenosed at prior stent site.  Dist Cx lesion, 70 %stenosed.  Mid LAD lesion, 20 %stenosed.  1st Diag lesion, 30 %stenosed.  Prox LAD-1 lesion, 30 %stenosed.  Prox LAD-2 lesion, 40 %stenosed.  The left ventricular systolic function is normal.  LV end diastolic pressure is normal.  The left ventricular ejection fraction is 55-65% by visual estimate.   1. Single vessel obstructive CAD with a 70% stenosis in the distal LCx (small vessel). This is unchanged from prior study. Continue patency of stent in the mid LCx extending into the OM. 2. Normal LV function 3. Normal LVEDP  Plan: continue medical therapy. Smoking cessation.   Patient Profile     57 y.o. male w/ PMH of CAD (s/p DES to LCx in 07/2015, catheterization in 03/2017 showing patent stent with 70% stenosis along distal LCx (small vessel) and continued medical therapy recommended), HTN, HLD, and GERD who presented to [facility] ED on 12/19/2017 for evaluation of chest pain.   Assessment & Plan    1. Chest Pain/ CAD - underwent DES placement to LCx in 07/2015 with repeat catheterization in 03/2017 showing patent stent with 70% stenosis along distal LCx (small vessel) and continued medical therapy was recommended at that time.  - presented for evaluation of chest pain which would occur at rest and last for 30-45 minute intervals. Denies any associated symptoms and no association with exertion.  - cyclic troponin values have been negative. EKG shows no acute ischemic changes. Echo shows a preserved EF of 55-60% with no regional WMA.  - he has ruled out for ACS. Symptoms possibly secondary to small vessel disease versus GERD (reports his PPI was recently switched). Will stop Heparin. Titrate Imdur to 60mg  daily. Continue ASA, Plavix, BB, and statin therapy. Will defer decisions regarding DAPT to his Primary Cardiologist in the outpatient setting. He has  scheduled outpatient follow-up within the next 2 weeks.    2. HTN - BP has been stable at 103/77 - 159/100 since admission.  - continue current medication regimen.   3. HLD - FLP this admission shows total cholesterol of 111, HDL 40, and LDL 52. At goal of LDL < 70. - continue Crestor 40mg  daily.    For questions or updates, please contact [place] Please consult [email or web address] for contact info under Cardiology/STEMI.   Arna Medici , PA-C 9:16 AM 12/20/2017 Pager: [phone]   Attending note:  Patient seen and examined.  Reviewed cardiology consultation by Dr. Harl Bowie and discussed the case with Ms. Delano Metz.  Mr. Biehn reports no further chest pain under observation and has ruled out for ACS.  He has been  continued on heparin infusion.  On examination this morning he appears comfortable, seated on his bed.  Lungs are clear with diminished breath sounds.  Cardiac exam with RRR no gallop.  Lab work reviewed, peak troponin I less than 0.03, potassium 4.3, creatinine 0.83, hemoglobin 14.3, platelets 191.  Follow-up echocardiogram demonstrated LVEF 55-60% without regional wall motion abnormalities.  I personally reviewed his ECG which shows sinus rhythm with R' in lead V1, no acute ST segment changes.  Chest pain in the setting of small vessel disease with prior stent intervention to the circumflex, assessed at angiography in June 2018.  No clear evidence of ACS.  Recommend stopping heparin, we will increase Imdur to 60 mg daily and continue baseline cardiac regimen.  Ambulate on the Sindt.  If he remains stable, anticipate discharge home.  He already has a follow-up visit with Dr. Bronson Ing in the next few weeks.  Satira Sark, M.D., F.A.C.C.

## 2017-12-20 NOTE — Discharge Summary (Signed)
Physician Discharge Summary  [name] UQJ:[id number] DOB: 1961/09/17 DOA: 12/19/2017  PCP: Jake Samples, PA-C  Admit date: 12/19/2017  Discharge date: 12/20/2017  Admitted From:Home  Disposition:  Home  Recommendations for Outpatient Follow-up:  1. Follow up with PCP in 4 weeks 2. Follow up with cardiologist Dr. Bronson Ing as scheduled in 2 weeks  Home Health:N/A  Equipment/Devices:N/A  Discharge Condition:Stable  CODE STATUS: Full  Diet recommendation: Heart Healthy  Brief/Interim Summary:  Per HPI: Anthony Hensley is a 57 year old with past medical history significant for hypertension, hyperlipidemia, gastroesophageal reflux disease, tobacco abuse, nonobstructive coronary artery disease and prior N STEMI with DES (October 2016); who presented to the emergency department complaining of mid/left-sided chest pain that started around 5 AM this morning and woke him up from sleep; patient's pain had an intensity 7-8/10, radiated to the right side of his chest and the base of his neck, associated with mild shortness of breath and diaphoresis, no nausea or vomiting; patient reported nothing makes the pain better or worse.  He expressed that the pain is very similar in presentation to the pain that he had with his prior MI.  No fever, no coughing, no sick contacts, no chills, no dysuria, no hematemesis, no hematochezia, no focal weakness or any other complaints.   Of note, patient reports that the pain lasted about 30 minutes when present,  then comes and goes but never completely resolved some improvement in ED with SL NTG.  He was placed on heparin drip during the course of his stay and had repeat troponins that were negative and EKG with no indication of ACS.  2D echocardiogram reviewed with EF 55-60% without regional wall motion abnormalities.  Cardiology Dr. Domenic Polite has evaluated the patient with recommendations to increase Imdur to 60 mg daily and continue baseline cardiac  regimen with follow-up to Dr. Bronson Ing as scheduled in 2 weeks.   Discharge Diagnoses:  Principal Problem:   Chest pain Active Problems:   Benign essential HTN   Hyperlipidemia   Tobacco abuse   GERD (gastroesophageal reflux disease)  1. Chest pain with history of CAD-resolved.  Continue home aspirin, Plavix, beta-blocker, and statin therapy.  Imdur titrated to 60 mg daily.  Follow-up with primary cardiologist Dr. Bronson Ing in 2 weeks. 2. Hypertension.  Continue current regimen with stable blood pressure readings noted. 3. Dyslipidemia.  Continue Crestor 40 mg daily.  Discharge Instructions  Discharge Instructions    Call MD for:  difficulty breathing, headache or visual disturbances   Complete by:  As directed    Diet - low sodium heart healthy   Complete by:  As directed    Increase activity slowly   Complete by:  As directed      Allergies as of 12/20/2017   No Known Allergies     Medication List    TAKE these medications   aspirin 81 MG EC tablet Take 1 tablet (81 mg total) by mouth daily.   clopidogrel 75 MG tablet Commonly known as:  PLAVIX TAKE 1 TABLET BY MOUTH EVERY DAY WITH BREAKFAST   dicyclomine 10 MG capsule Commonly known as:  BENTYL Take 1 capsule (10 mg total) by mouth 4 (four) times daily as needed for spasms.   isosorbide mononitrate 60 MG 24 hr tablet Commonly known as:  IMDUR Take 1 tablet (60 mg total) by mouth daily. Start taking on:  12/21/2017 What changed:    medication strength  how much to take   lisinopril 2.5 MG tablet  Commonly known as:  PRINIVIL,ZESTRIL TAKE 1 TABLET BY MOUTH daily   metoprolol succinate 50 MG 24 hr tablet Commonly known as:  TOPROL-XL Take 1 tablet (50 mg total) by mouth at bedtime.   NITROSTAT 0.4 MG SL tablet Generic drug:  nitroGLYCERIN Place 1 tablet under the tongue daily as needed for chest pain.   pantoprazole 40 MG tablet Commonly known as:  PROTONIX Take 1 tablet (40 mg total) by mouth 2  (two) times daily before a meal.   PROAIR HFA 108 (90 Base) MCG/ACT inhaler Generic drug:  albuterol Inhale 2 puffs into the lungs every 4 (four) hours as needed for shortness of breath.   albuterol (2.5 MG/3ML) 0.083% nebulizer solution Commonly known as:  PROVENTIL Take 3 mLs by nebulization daily as needed for shortness of breath.   rosuvastatin 40 MG tablet Commonly known as:  CRESTOR Take 1 tablet (40 mg total) by mouth daily at 6 PM.   VISINE TEARS OP Apply 1 drop to eye daily as needed (dry eyes).      Follow-up Information    Herminio Commons, MD Follow up on 01/03/2018.   Specialty:  Cardiology Why:  Cardiology Follow-Up on 01/03/2018 at 4:00PM.  Contact information: [place] [address] [phone]        Jake Samples, PA-C Follow up in 4 week(s).   Specialty:  Family Medicine Contact information: [address] [place] [address] [phone]          No Known Allergies  Consultations:  Cardiology Dr. Domenic Polite   Procedures/Studies: Dg Chest 2 View  Result Date: 12/19/2017 CLINICAL DATA:  Chest pain and shortness of breath beginning today. History coronary stent. EXAM: CHEST - 2 VIEW COMPARISON:  03/20/2017 FINDINGS: Artifact overlies the chest. Mediastinal shadows are normal. The pulmonary vascularity is normal. Lungs are clear. No effusions. No bone abnormality. Coronary stent visible in the left system. IMPRESSION: No active disease.  Coronary stent. Electronically Signed   By: Nelson Chimes M.D.   On: 12/19/2017 10:36    Discharge Exam: Vitals:   12/20/17 0400 12/20/17 0800  BP: 103/80 127/82  Pulse: (!) 55 66  Resp: 14 16  Temp: 97.9 F (36.6 C) 97.7 F (36.5 C)  SpO2: 100% 96%   Vitals:   12/19/17 1937 12/19/17 2000 12/20/17 0400 12/20/17 0800  BP:  129/84 103/80 127/82  Pulse:  61 (!) 55 66  Resp:  20 14 16   Temp:  98.2 F (36.8 C) 97.9 F (36.6 C) 97.7 F (36.5 C)  TempSrc:  Oral Oral Oral   SpO2: 96% 94% 100% 96%  Weight:      Height:        General: Pt is alert, awake, not in acute distress Cardiovascular: RRR, S1/S2 +, no rubs, no gallops Respiratory: CTA bilaterally, no wheezing, no rhonchi Abdominal: Soft, NT, ND, bowel sounds + Extremities: no edema, no cyanosis    The results of significant diagnostics from this hospitalization (including imaging, microbiology, ancillary and laboratory) are listed below for reference.     Microbiology: No results found for this or any previous visit (from the past 240 hour(s)).   Labs: BNP (last 3 results) No results for input(s): BNP in the last 8760 hours. Basic Metabolic Panel: Recent Labs  Lab 12/19/17 1021  NA 141  K 4.3  CL 105  CO2 28  GLUCOSE 102*  BUN 23*  CREATININE 0.83  CALCIUM 9.1   Liver Function Tests: No results for  input(s): AST, ALT, ALKPHOS, BILITOT, PROT, ALBUMIN in the last 168 hours. No results for input(s): LIPASE, AMYLASE in the last 168 hours. No results for input(s): AMMONIA in the last 168 hours. CBC: Recent Labs  Lab 12/19/17 1021 12/20/17 0510  WBC 4.9 6.8  HGB 14.3 13.8  HCT 45.7 42.9  MCV 94.6 94.3  PLT 191 167   Cardiac Enzymes: Recent Labs  Lab 12/19/17 1334 12/19/17 1655 12/19/17 2011 12/19/17 2247  TROPONINI <0.03 <0.03 <0.03 <0.03   BNP: Invalid input(s): POCBNP CBG: No results for input(s): GLUCAP in the last 168 hours. D-Dimer No results for input(s): DDIMER in the last 72 hours. Hgb A1c Recent Labs    12/19/17 1656  HGBA1C 5.7*   Lipid Profile Recent Labs    12/20/17 0510  CHOL 111  HDL 40*  LDLCALC 52  TRIG 95  CHOLHDL 2.8   Thyroid function studies Recent Labs    12/19/17 1653  TSH 0.875   Anemia work up No results for input(s): VITAMINB12, FOLATE, FERRITIN, TIBC, IRON, RETICCTPCT in the last 72 hours. Urinalysis    Component Value Date/Time   COLORURINE YELLOW 09/26/2016 0245   APPEARANCEUR CLEAR 09/26/2016 0245   LABSPEC  >1.030 (H) 09/26/2016 0245   PHURINE 5.5 09/26/2016 0245   GLUCOSEU NEGATIVE 09/26/2016 0245   HGBUR TRACE (A) 09/26/2016 0245   BILIRUBINUR NEGATIVE 09/26/2016 0245   KETONESUR NEGATIVE 09/26/2016 0245   PROTEINUR NEGATIVE 09/26/2016 0245   UROBILINOGEN 0.2 06/01/2015 2320   NITRITE NEGATIVE 09/26/2016 0245   LEUKOCYTESUR NEGATIVE 09/26/2016 0245   Sepsis Labs Invalid input(s): PROCALCITONIN,  WBC,  LACTICIDVEN Microbiology No results found for this or any previous visit (from the past 240 hour(s)).   Time coordinating discharge: Over 30 minutes  SIGNED:   Rodena Goldmann, DO [facility] 12/20/2017, 11:11 AM Pager [phone]  If 7PM-7AM, please contact night-coverage [email or web address] Password [email or web address]

## 2017-12-20 NOTE — Progress Notes (Signed)
Patient discharged home.  IV removed - WNL.  Reviewed AVS and medications. Follow up in place with cardio - also instructed to follow up with PCP in 4 weeks.  Work note form MD given to patient.  Patient has no questions - verbalizes understanding.  Assisted off unit in NAD

## 2017-12-29 ENCOUNTER — Other Ambulatory Visit: Payer: Self-pay | Admitting: Cardiovascular Disease

## 2018-01-03 ENCOUNTER — Ambulatory Visit: Payer: Commercial Managed Care - PPO | Admitting: Cardiovascular Disease

## 2018-01-03 ENCOUNTER — Encounter: Payer: Self-pay | Admitting: Cardiovascular Disease

## 2018-01-03 ENCOUNTER — Encounter: Payer: Self-pay | Admitting: *Deleted

## 2018-01-03 VITALS — BP 130/84 | HR 82 | Ht 66.0 in | Wt 170.0 lb

## 2018-01-03 DIAGNOSIS — E785 Hyperlipidemia, unspecified: Secondary | ICD-10-CM

## 2018-01-03 DIAGNOSIS — Z716 Tobacco abuse counseling: Secondary | ICD-10-CM

## 2018-01-03 DIAGNOSIS — Z955 Presence of coronary angioplasty implant and graft: Secondary | ICD-10-CM

## 2018-01-03 DIAGNOSIS — I10 Essential (primary) hypertension: Secondary | ICD-10-CM

## 2018-01-03 DIAGNOSIS — I25118 Atherosclerotic heart disease of native coronary artery with other forms of angina pectoris: Secondary | ICD-10-CM | POA: Diagnosis not present

## 2018-01-03 MED ORDER — VARENICLINE TARTRATE 0.5 MG X 11 & 1 MG X 42 PO MISC: ORAL | 0 refills | Status: DC

## 2018-01-03 MED ORDER — ISOSORBIDE MONONITRATE ER 60 MG PO TB24: 90.0000 mg | ORAL_TABLET | Freq: Every day | ORAL | 11 refills | Status: DC

## 2018-01-03 MED ORDER — METOPROLOL SUCCINATE ER 50 MG PO TB24: ORAL_TABLET | ORAL | 11 refills | Status: DC

## 2018-01-03 NOTE — Patient Instructions (Addendum)
Medication Instructions:  Your physician has recommended you make the following change in your medication:  Stop Taking Plavix  Take Toprol XL 75 mg Daily in the AM  Take Imdur 90 mg Daily in the AM  Start Chantix    Labwork: NONE   Testing/Procedures: NONE   Follow-Up: Your physician wants you to follow-up in: 6 Months.  You will receive a reminder letter in the mail two months in advance. If you don't receive a letter, please call our office to schedule the follow-up appointment.   Any Other Special Instructions Will Be Listed Below (If Applicable). You have been given a note for work today.     If you need a refill on your cardiac medications before your next appointment, please call your pharmacy. Thank you for choosing [facility]!

## 2018-01-03 NOTE — Progress Notes (Signed)
SUBJECTIVE: The patient presents for follow-up after recently being hospitalized for chest pain.  Echocardiogram on 12/19/17 showed normal left ventricular systolic function and regional wall motion, LVEF 55-60%.  Imdur was increased to 60 mg daily.  Coronary angiography on 03/20/17 demonstrated single vessel obstructive coronary disease with a 70% stenosis in the distal left circumflex which is a small vessel and unchanged from prior study.  There was continued patency of the stent in the mid left circumflex extending into the obtuse marginal branch.  He initially felt well after being discharged but has had several episodes of chest pain while at work.  All symptoms are relieved with nitroglycerin.  He also continues to smoke over a pack of cigarettes daily.  We spoke at length about tobacco cessation. He denies palpitations and leg swelling.  He is a Dealer.   Review of Systems: As per "subjective", otherwise negative.  No Known Allergies  Current Outpatient Medications  Medication Sig Dispense Refill  . albuterol (PROVENTIL) (2.5 MG/3ML) 0.083% nebulizer solution Take 3 mLs by nebulization daily as needed for shortness of breath.  3  . aspirin 81 MG EC tablet Take 1 tablet (81 mg total) by mouth daily. 30 tablet   . clopidogrel (PLAVIX) 75 MG tablet TAKE 1 TABLET BY MOUTH EVERY DAY WITH BREAKFAST 30 tablet 6  . dicyclomine (BENTYL) 10 MG capsule Take 1 capsule (10 mg total) by mouth 4 (four) times daily as needed for spasms. 90 capsule 1  . Glycerin-Hypromellose-PEG 400 (VISINE TEARS OP) Apply 1 drop to eye daily as needed (dry eyes).     . isosorbide mononitrate (IMDUR) 60 MG 24 hr tablet Take 1 tablet (60 mg total) by mouth daily. 30 tablet 1  . lisinopril (PRINIVIL,ZESTRIL) 2.5 MG tablet TAKE 1 TABLET BY MOUTH daily 30 tablet 6  . metoprolol succinate (TOPROL-XL) 50 MG 24 hr tablet Take 1 tablet (50 mg total) by mouth at bedtime. 90 tablet 3  . NITROSTAT 0.4 MG SL tablet  Place 1 tablet (0.4 mg total) under the tongue every 5 (five) minutes as needed for chest pain. 90 tablet 1  . pantoprazole (PROTONIX) 40 MG tablet Take 1 tablet (40 mg total) by mouth 2 (two) times daily before a meal. 180 tablet 1  . PROAIR HFA 108 (90 Base) MCG/ACT inhaler Inhale 2 puffs into the lungs every 4 (four) hours as needed for shortness of breath.  3  . rosuvastatin (CRESTOR) 40 MG tablet Take 1 tablet (40 mg total) by mouth daily at 6 PM. 90 tablet 2   No current facility-administered medications for this visit.     Past Medical History:  Diagnosis Date  . Anginal pain ([place])   . Arthritis    " IN MY NECK & SHOULDERS "  . CAD (coronary artery disease)    DES to mid circumflex October 2016  . Diverticulosis   . Dyspnea     AT TIMES"  . GERD (gastroesophageal reflux disease)   . Hypercholesterolemia   . Hypertension   . NSTEMI (non-ST elevated myocardial infarction) [facility])    October 2016    Past Surgical History:  Procedure Laterality Date  . CARDIAC CATHETERIZATION N/A 07/13/2015   Procedure: Left Heart Cath and Coronary Angiography;  Surgeon: Burnell Blanks, MD;  Location: [place] [facility];  Service: Cardiovascular;  Laterality: N/A;  . CARDIAC CATHETERIZATION N/A 07/13/2015   PCI + DES to the mid circ. LVEF was normal at 65%  .  COLONOSCOPY N/A 12/31/2015   Dr.Rourk- diverticulosis,26m polyp in the splenic flexure, 9109mpolyp in the splenic flexure, 11m34molyp in the sigmoid colon bx= traditional serrated adenoma  . ESOPHAGOGASTRODUODENOSCOPY N/A 12/31/2015   Dr.Rourk- esophagitis with no bleeding, diffuse moderately erythematous mucosa without bleeding was found in the entire examined stomach. stomach bx= slight chronic inflammation. esophagus bx= benign gastresophageal junction mucosa  . LEFT HEART CATH AND CORONARY ANGIOGRAPHY N/A 03/20/2017   Procedure: Left Heart Cath and Coronary Angiography;  Surgeon: JorMartiniqueeter M, MD;  Location: [facility] [place] [facility];   Service: Cardiovascular;  Laterality: N/A;  . MALVenia MinksLATION N/A 12/31/2015   Procedure: MALVenia MinksLATION;  Surgeon: RobDaneil DolinD;  [facility]: [facility];  Service: Endoscopy;  Laterality: N/A;    Social History   Socioeconomic History  . Marital status: Married    Spouse name: Not on file  . Number of children: Not on file  . Years of education: Not on file  . Highest education level: Not on file  Occupational History  . 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 Financial resource strain: Not on file  . Food insecurity:    Worry: Not on file    Inability: Not on file  . Transportation needs:    Medical: Not on file    Non-medical: Not on file  Tobacco Use  . Smoking status: Current Every Day Smoker    Packs/day: 0.75    Years: 35.00    Pack years: 26.25    Types: Cigarettes    Start date: 08/08/1976  . Smokeless tobacco: Never Used  Substance and Sexual Activity  . Alcohol use: No    Alcohol/week: 0.0 oz  . Drug use: No  . Sexual activity: Not on file  Lifestyle  . Physical activity:    Days per week: Not on file    Minutes per session: Not on file  . Stress: Not on file  Relationships  . Social connections:    Talks on phone: Not on file    Gets together: Not on file    Attends religious service: Not on file    Active member of club or organization: Not on file    Attends meetings of clubs or organizations: Not on file    Relationship status: Not on file  . Intimate partner violence:    Fear of current or ex partner: Not on file    Emotionally abused: Not on file    Physically abused: Not on file    Forced sexual activity: Not on file  Other Topics Concern  . Not on file  Social History Narrative  . Not on file     Vitals:   01/03/18 1555  BP: 130/84  Pulse: 82  SpO2: 95%  Weight: 170 lb (77.1 kg)  Height: '5\' 6"'$  (1.676 m)    Wt Readings from Last 3 Encounters:  01/03/18 170  lb (77.1 kg)  12/19/17 170 lb (77.1 kg)  06/29/17 172 lb 4.8 oz (78.2 kg)     PHYSICAL EXAM General: NAD HEENT: Normal. Neck: No JVD, no thyromegaly. Lungs: Clear to auscultation bilaterally with normal respiratory effort. CV: Regular rate and rhythm, normal S1/S2, no S3/S4, no murmur. No pretibial or periankle edema.     Abdomen: Soft, nontender, no distention.  Neurologic: Alert and oriented.  Psych: Normal affect. Skin: Normal. Musculoskeletal: No gross deformities.    ECG: Most recent  ECG reviewed.   Labs: Lab Results  Component Value Date/Time   K 4.3 12/19/2017 10:21 AM   BUN 23 (H) 12/19/2017 10:21 AM   CREATININE 0.83 12/19/2017 10:21 AM   CREATININE 0.89 02/25/2016 03:29 PM   ALT 30 09/26/2016 01:39 AM   TSH 0.875 12/19/2017 04:53 PM   HGB 13.8 12/20/2017 05:10 AM     Lipids: Lab Results  Component Value Date/Time   LDLCALC 52 12/20/2017 05:10 AM   CHOL 111 12/20/2017 05:10 AM   TRIG 95 12/20/2017 05:10 AM   HDL 40 (L) 12/20/2017 05:10 AM       Coronary angiography 03/20/17:  Prox RCA lesion, 20 %stenosed.  Mid Cx lesion, 0 %stenosed at prior stent site.  Dist Cx lesion, 70 %stenosed.  Mid LAD lesion, 20 %stenosed.  1st Diag lesion, 30 %stenosed.  Prox LAD-1 lesion, 30 %stenosed.  Prox LAD-2 lesion, 40 %stenosed.  The left ventricular systolic function is normal.  LV end diastolic pressure is normal.  The left ventricular ejection fraction is 55-65% by visual estimate.   1. Single vessel obstructive CAD with a 70% stenosis in the distal LCx (small vessel). This is unchanged from prior study. Continue patency of stent in the mid LCx extending into the OM. 2. Normal LV function 3. Normal LVEDP  ASSESSMENT AND PLAN: 1.  Coronary disease with left circumflex stent and angina: Currently on aspirin, Plavix, Imdur 60 mg, Toprol-XL 50 mg daily, and Crestor 40 mg.  I will discontinue Plavix.  I will increase Toprol-XL to 75 mg to be taken every  morning as he is currently taking it at bedtime.  I will also increase Imdur to 90 mg every morning.  I also spoke to him at length about tobacco cessation.  2.  Hypertension: Controlled on present therapy.  I will monitor given medication adjustments noted above.  3.  Hyperlipidemia: LDL 52 which is at goal when checked earlier this month.  Continue Crestor 40 mg.  4.  Tobacco abuse: We spoke at length about cessation (3 minutes).  I will prescribe a Chantix starter kit.   Disposition: Follow up 6 months   Kate Sable, M.D., F.A.C.C.

## 2018-01-08 ENCOUNTER — Encounter: Payer: Self-pay | Admitting: Nurse Practitioner

## 2018-01-08 ENCOUNTER — Ambulatory Visit: Payer: Commercial Managed Care - PPO | Admitting: Nurse Practitioner

## 2018-01-08 VITALS — BP 133/83 | HR 66 | Temp 97.2°F | Ht 66.0 in | Wt 173.2 lb

## 2018-01-08 DIAGNOSIS — K219 Gastro-esophageal reflux disease without esophagitis: Secondary | ICD-10-CM

## 2018-01-08 DIAGNOSIS — R1033 Periumbilical pain: Secondary | ICD-10-CM

## 2018-01-08 DIAGNOSIS — R1310 Dysphagia, unspecified: Secondary | ICD-10-CM

## 2018-01-08 NOTE — Progress Notes (Signed)
Referring Provider: Jake Samples, PA* Primary Care Physician:  Jake Samples, PA-C Primary GI:  Dr. Gala Romney  Chief Complaint  Patient presents with  . Abdominal Pain    rates about 2-3 on pain scale. Comes/goes  . Gastroesophageal Reflux    f/u. Doing okay  . Dysphagia    "strangled at night"     HPI:   Anthony Hensley is a 57 y.o. male who presents for follow-up on abdominal pain and GERD.  The patient was last seen in our office 06/29/2017 also for GERD.  As an aside, colonoscopy up-to-date and next due for surveillance in 2022.  Previously GERD was well controlled on AcipHex.  His last visit his GERD symptoms were doing "pretty good."  Rate through symptoms occur 3-4 times a week which last 30 minutes to an hour and resolved spontaneously.  Included in his symptoms of GERD his chest pain although he did undergo a left heart cath recently with no change.  He is followed by cardiology.  Noted diarrhea with mid abdominal pain about 2 times a week, no improvement in pain after bowel movement.  Has known hemorrhoids with intermittent hematochezia about once a week.  Recommended increase Protonix to twice a day, right upper quadrant ultrasound, Bentyl 10 mg 4 times a day as needed, follow-up in 3 months.    Ultrasound of the abdomen completed 07/06/2017 and was negative.  It does not appear he followed up in 3 months.  He was admitted 12/19/2017 for unstable angina to [facility].  Overall workup was negative.  His M door was increased to 60 mg daily and continue baseline cardiac regimen.  Today he states he's doing well overall. GERD well controlled on PPI. He does mention nighttime "getting strangled" on saliva and such since EGD with dilation in 2017. Occasional, intermittent abdominal pain which he thinks is due to "old age" and diet choices. He states this is not particularly bothersome. Notes occasional hemorrhoid bleeding. Uses Preparation H which works well for him.  Denies melena, N/V, unintentional weight loss, changes in bowel movements/habits. Is working on quitting smoking. Denies chest pain, dyspnea, dizziness, lightheadedness, syncope, near syncope. Denies any other upper or lower GI symptoms.  Past Medical History:  Diagnosis Date  . Anginal pain ([place])   . Arthritis    " IN MY NECK & SHOULDERS "  . CAD (coronary artery disease)    DES to mid circumflex October 2016  . Diverticulosis   . Dyspnea     AT TIMES"  . GERD (gastroesophageal reflux disease)   . Hypercholesterolemia   . Hypertension   . NSTEMI (non-ST elevated myocardial infarction) [facility])    October 2016    Past Surgical History:  Procedure Laterality Date  . CARDIAC CATHETERIZATION N/A 07/13/2015   Procedure: Left Heart Cath and Coronary Angiography;  Surgeon: Burnell Blanks, MD;  Location: [place] [facility];  Service: Cardiovascular;  Laterality: N/A;  . CARDIAC CATHETERIZATION N/A 07/13/2015   PCI + DES to the mid circ. LVEF was normal at 65%  . COLONOSCOPY N/A 12/31/2015   Dr.Rourk- diverticulosis,57mm polyp in the splenic flexure, 71mm polyp in the splenic flexure, 26mm polyp in the sigmoid colon bx= traditional serrated adenoma  . ESOPHAGOGASTRODUODENOSCOPY N/A 12/31/2015   Dr.Rourk- esophagitis with no bleeding, diffuse moderately erythematous mucosa without bleeding was found in the entire examined stomach. stomach bx= slight chronic inflammation. esophagus bx= benign gastresophageal junction mucosa  . LEFT HEART CATH AND  CORONARY ANGIOGRAPHY N/A 03/20/2017   Procedure: Left Heart Cath and Coronary Angiography;  Surgeon: Martinique, Peter M, MD;  Location: [place] [facility];  Service: Cardiovascular;  Laterality: N/A;  . Venia Minks DILATION N/A 12/31/2015   Procedure: Venia Minks DILATION;  Surgeon: [name], MD;  [facility];  Service: Endoscopy;  Laterality: N/A;    Current Outpatient Medications  Medication Sig Dispense Refill  . albuterol (PROVENTIL) (2.5  MG/3ML) 0.083% nebulizer solution Take 3 mLs by nebulization daily as needed for shortness of breath.  3  . aspirin 81 MG EC tablet Take 1 tablet (81 mg total) by mouth daily. 30 tablet   . dicyclomine (BENTYL) 10 MG capsule Take 1 capsule (10 mg total) by mouth 4 (four) times daily as needed for spasms. 90 capsule 1  . Glycerin-Hypromellose-PEG 400 (VISINE TEARS OP) Apply 1 drop to eye daily as needed (dry eyes).     . isosorbide mononitrate (IMDUR) 60 MG 24 hr tablet Take 1.5 tablets (90 mg total) by mouth daily. 90 tablet 11  . lisinopril (PRINIVIL,ZESTRIL) 2.5 MG tablet TAKE 1 TABLET BY MOUTH daily 30 tablet 6  . metoprolol succinate (TOPROL-XL) 50 MG 24 hr tablet Take 75 mg (1 and 1/2 Tablet) Daily Each Morning. Take with or immediately following a meal. 90 tablet 11  . NITROSTAT 0.4 MG SL tablet Place 1 tablet (0.4 mg total) under the tongue every 5 (five) minutes as needed for chest pain. 90 tablet 1  . pantoprazole (PROTONIX) 40 MG tablet Take 1 tablet (40 mg total) by mouth 2 (two) times daily before a meal. 180 tablet 1  . PROAIR HFA 108 (90 Base) MCG/ACT inhaler Inhale 2 puffs into the lungs every 4 (four) hours as needed for shortness of breath.  3  . rosuvastatin (CRESTOR) 40 MG tablet Take 1 tablet (40 mg total) by mouth daily at 6 PM. 90 tablet 2  . varenicline (CHANTIX STARTING MONTH PAK) 0.5 MG X 11 & 1 MG X 42 tablet Take one 0.5 mg tablet by mouth once daily for 3 days, then increase to one 0.5 mg tablet twice daily for 4 days, then increase to one 1 mg tablet twice daily. 53 tablet 0   No current facility-administered medications for this visit.     Allergies as of 01/08/2018  . (No Known Allergies)    Family History  Problem Relation Age of Onset  . Heart attack Father 61       Deceased  . Colon cancer Neg Hx     Social History   Socioeconomic History  . Marital status: Married    Spouse name: Not on file  . Number of children: Not on file  . Years of education:  Not on file  . Highest education level: Not on file  Occupational History  . 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 Financial resource strain: Not on file  . Food insecurity:    Worry: Not on file    Inability: Not on file  . Transportation needs:    Medical: Not on file    Non-medical: Not on file  Tobacco Use  . Smoking status: Current Every Day Smoker    Packs/day: 0.75    Years: 35.00    Pack years: 26.25    Types: Cigarettes    Start date: 08/08/1976  . Smokeless tobacco: Never Used  Substance and Sexual Activity  .  Alcohol use: No    Alcohol/week: 0.0 oz  . Drug use: No  . Sexual activity: Not on file  Lifestyle  . Physical activity:    Days per week: Not on file    Minutes per session: Not on file  . Stress: Not on file  Relationships  . Social connections:    Talks on phone: Not on file    Gets together: Not on file    Attends religious service: Not on file    Active member of club or organization: Not on file    Attends meetings of clubs or organizations: Not on file    Relationship status: Not on file  Other Topics Concern  . Not on file  Social History Narrative  . Not on file    Review of Systems: General: Negative for anorexia, weight loss, fever, chills, fatigue, weakness. ENT: Negative for hoarseness, difficulty swallowing , nasal congestion. CV: Negative for chest pain, angina, palpitations, dyspnea on exertion, peripheral edema.  Respiratory: Negative for dyspnea at rest, dyspnea on exertion, cough, sputum, wheezing.  GI: See history of present illness. Endo: Negative for unusual weight change.  Heme: Negative for bruising or bleeding.   Physical Exam: BP 133/83   Pulse 66   Temp (!) 97.2 F (36.2 C) (Oral)   Ht 5\' 6"  (1.676 m)   Wt 173 lb 3.2 oz (78.6 kg)   BMI 27.96 kg/m  General:   Alert and oriented. Pleasant and cooperative. Well-nourished and well-developed.  Eyes:   Without icterus, sclera clear and conjunctiva pink.  Ears:  Normal auditory acuity. Cardiovascular:  S1, S2 present without murmurs appreciated. Extremities without clubbing or edema. Respiratory:  Clear to auscultation bilaterally. No wheezes, rales, or rhonchi. No distress.  Gastrointestinal:  +BS, soft, non-tender and non-distended. No guarding or rebound. No masses appreciated.  Rectal:  Deferred  Musculoskalatal:  Symmetrical without gross deformities. Neurologic:  Alert and oriented x4;  grossly normal neurologically. Psych:  Alert and cooperative. Normal mood and affect. Heme/Lymph/Immune: No excessive bruising noted.    01/08/2018 9:14 AM   Disclaimer: This note was dictated with voice recognition software. Similar sounding words can inadvertently be transcribed and may not be corrected upon review.

## 2018-01-08 NOTE — Patient Instructions (Signed)
1. Continue your current medications.  Specifically, continue your Protonix twice a day. 2. Try elevating the head of your bed on blocks.  Alternatively, you could elevate your head only with 2-3 pillows when sleeping.  This may help the reflux of saliva or stomach acid at night. 3. Follow-up in 1 year. 4. Call us if you have any worsening symptoms. 5. We will provide you with a work note today. 6. Call if you have any questions or concerns.    It was great to see you today.  Good luck with the garden, and have a great summer!!    At [facility] we value your feedback. You may receive a survey about your visit today. Please share your experience as we strive to create trusing relationships with our patients to provide genuine, compassionate, quality care.

## 2018-01-08 NOTE — Progress Notes (Signed)
cc'd to pcp 

## 2018-01-08 NOTE — Assessment & Plan Note (Signed)
Rare/intermittent abdominal pain which is not bothersome.  He attributes this to gas pains versus potential dietary indiscretions.  Recommend he continue with PPI, avoid trigger foods.  Call if any worsening symptoms.  Otherwise, follow-up in 1 year.

## 2018-01-08 NOTE — Assessment & Plan Note (Signed)
GERD symptoms currently doing well on twice daily Protonix.  Recommend he continue this.  Follow-up in 1 year.

## 2018-01-08 NOTE — Assessment & Plan Note (Signed)
It was indicated that he was having dysphasia.  However, it seems that it is not truly dysphasia but rather reflux of saliva and possibly bile when he lays flat.  This is been ongoing since 2017 after dilation.  Likely due to dilation allowing greater degree of reflux.  His symptoms are generally well controlled.  At this point I will recommend he elevate the head of his bed on blocks to see if this helps.  He could alternatively elevate his head with 2 or 3 pillows.  Follow-up in 1 year.  Call if any worsening symptoms.

## 2018-01-11 ENCOUNTER — Encounter (HOSPITAL_COMMUNITY): Payer: Self-pay

## 2018-01-11 ENCOUNTER — Emergency Department (HOSPITAL_COMMUNITY): Payer: Commercial Managed Care - PPO

## 2018-01-11 ENCOUNTER — Emergency Department (HOSPITAL_COMMUNITY)
Admission: EM | Admit: 2018-01-11 | Discharge: 2018-01-11 | Disposition: A | Discharge status: Home / Self Care | Payer: Commercial Managed Care - PPO | Source: Home / Self Care | Attending: Emergency Medicine | Admitting: Emergency Medicine

## 2018-01-11 ENCOUNTER — Other Ambulatory Visit: Payer: Self-pay

## 2018-01-11 ENCOUNTER — Emergency Department (HOSPITAL_COMMUNITY)
Admission: EM | Admit: 2018-01-11 | Discharge: 2018-01-11 | Disposition: A | Discharge status: Home / Self Care | Payer: Commercial Managed Care - PPO | Attending: Emergency Medicine | Admitting: Emergency Medicine

## 2018-01-11 DIAGNOSIS — I252 Old myocardial infarction: Secondary | ICD-10-CM

## 2018-01-11 DIAGNOSIS — R079 Chest pain, unspecified: Secondary | ICD-10-CM | POA: Diagnosis not present

## 2018-01-11 DIAGNOSIS — I2510 Atherosclerotic heart disease of native coronary artery without angina pectoris: Secondary | ICD-10-CM

## 2018-01-11 DIAGNOSIS — I10 Essential (primary) hypertension: Secondary | ICD-10-CM | POA: Insufficient documentation

## 2018-01-11 DIAGNOSIS — R072 Precordial pain: Secondary | ICD-10-CM

## 2018-01-11 DIAGNOSIS — Z5321 Procedure and treatment not carried out due to patient leaving prior to being seen by health care provider: Secondary | ICD-10-CM | POA: Insufficient documentation

## 2018-01-11 DIAGNOSIS — Z7902 Long term (current) use of antithrombotics/antiplatelets: Secondary | ICD-10-CM

## 2018-01-11 DIAGNOSIS — Z7982 Long term (current) use of aspirin: Secondary | ICD-10-CM | POA: Insufficient documentation

## 2018-01-11 DIAGNOSIS — R0789 Other chest pain: Secondary | ICD-10-CM | POA: Diagnosis not present

## 2018-01-11 DIAGNOSIS — F17210 Nicotine dependence, cigarettes, uncomplicated: Secondary | ICD-10-CM

## 2018-01-11 DIAGNOSIS — K429 Umbilical hernia without obstruction or gangrene: Secondary | ICD-10-CM | POA: Diagnosis not present

## 2018-01-11 DIAGNOSIS — R0602 Shortness of breath: Secondary | ICD-10-CM | POA: Diagnosis not present

## 2018-01-11 LAB — TROPONIN I
Troponin I: <0.03 ng/mL (ref <0.03)
Troponin I: <0.03 ng/mL (ref <0.03)

## 2018-01-11 LAB — CBC
HEMATOCRIT: 47.6 % (ref 39.0–52.0)
HEMOGLOBIN: 15.7 g/dL (ref 13.0–17.0)
MCH: 30.6 pg (ref 26.0–34.0)
MCHC: 33 g/dL (ref 30.0–36.0)
MCV: 92.8 fL (ref 78.0–100.0)
Platelets: 201 10*3/uL (ref 150–400)
RBC: 5.13 MIL/uL (ref 4.22–5.81)
RDW: 12.8 % (ref 11.5–15.5)
WBC: 5.6 10*3/uL (ref 4.0–10.5)

## 2018-01-11 LAB — BASIC METABOLIC PANEL
ANION GAP: 8 (ref 5–15)
BUN: 16 mg/dL (ref 6–20)
CALCIUM: 8.9 mg/dL (ref 8.9–10.3)
CO2: 23 mmol/L (ref 22–32)
Chloride: 106 mmol/L (ref 101–111)
Creatinine, Ser: 0.82 mg/dL (ref 0.61–1.24)
GFR calc non Af Amer: >60 mL/min (ref >60)
GLUCOSE: 86 mg/dL (ref 65–99)
POTASSIUM: 3.9 mmol/L (ref 3.5–5.1)
Sodium: 137 mmol/L (ref 135–145)

## 2018-01-11 LAB — I-STAT TROPONIN, ED: TROPONIN I, POC: 0 ng/mL (ref 0.00–0.08)

## 2018-01-11 MED ORDER — ASPIRIN 81 MG PO CHEW
324.0000 mg | CHEWABLE_TABLET | Freq: Once | ORAL | Status: AC
  Administered 2018-01-11: 324 mg via ORAL
  Filled 2018-01-11: qty 4

## 2018-01-11 MED ORDER — MORPHINE SULFATE (PF) 2 MG/ML IV SOLN
2.0000 mg | Freq: Once | INTRAVENOUS | Status: AC
  Administered 2018-01-11: 2 mg via INTRAVENOUS
  Filled 2018-01-11: qty 1

## 2018-01-11 MED ORDER — SODIUM CHLORIDE 0.9 % IV SOLN
INTRAVENOUS | Status: DC
  Administered 2018-01-11: 11:00:00 via INTRAVENOUS

## 2018-01-11 MED ORDER — NITROGLYCERIN 0.4 MG SL SUBL
0.4000 mg | SUBLINGUAL_TABLET | SUBLINGUAL | Status: DC | PRN
  Administered 2018-01-11: 0.4 mg via SUBLINGUAL
  Filled 2018-01-11: qty 1

## 2018-01-11 MED ORDER — ONDANSETRON HCL 4 MG/2ML IJ SOLN
4.0000 mg | Freq: Once | INTRAMUSCULAR | Status: AC
  Administered 2018-01-11: 4 mg via INTRAVENOUS
  Filled 2018-01-11: qty 2

## 2018-01-11 MED ORDER — IOPAMIDOL (ISOVUE-370) INJECTION 76%
100.0000 mL | Freq: Once | INTRAVENOUS | Status: AC | PRN
  Administered 2018-01-11: 100 mL via INTRAVENOUS

## 2018-01-11 NOTE — ED Notes (Signed)
Pt returned from xray

## 2018-01-11 NOTE — ED Provider Notes (Addendum)
[facility] EMERGENCY DEPARTMENT Provider Note   CSN: [id number] Arrival date & time: 01/11/18  8469     History   Chief Complaint Chief Complaint  Patient presents with  . Chest Pain    HPI Anthony Hensley is a 57 y.o. male.  Patient with a known history of coronary disease.  Patient had acute onset of left anterior chest pain at 4 this morning.  Described as 10 out of 10.  Persistent has not resolved.  Patient initially went to [facility] ED weight was long so he left and came here.  However he did have chest x-ray labs and troponin done there which have been reviewed and are normal.  Patient recently seen by cardiology as an outpatient at the end of March.  Patient was admitted March 12 for unstable angina but ruled out.  Cardiology made some adjustments in his long-acting nitroglycerin and his other meds.  At the time of the visit patient was doing okay he occasionally gets chest pain is never had anything last this long.  Does not radiate to the back not associated with shortness of breath or nausea and vomiting.     Past Medical History:  Diagnosis Date  . Anginal pain ([place])   . Arthritis    " IN MY NECK & SHOULDERS "  . CAD (coronary artery disease)    DES to mid circumflex October 2016  . Diverticulosis   . Dyspnea     AT TIMES"  . GERD (gastroesophageal reflux disease)   . Hypercholesterolemia   . Hypertension   . NSTEMI (non-ST elevated myocardial infarction) [facility])    October 2016    Patient Active Problem List   Diagnosis Date Noted  . Coronary artery disease involving native heart with angina pectoris ([place]) 03/20/2017  . CAD (coronary artery disease) 03/20/2017  . Unstable angina ([place])   . Coronary artery disease involving native coronary artery of native heart with angina pectoris ([place]) 03/17/2017  . 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 Precordial chest pain   . Diarrhea 09/26/2016  .  Enteritis 09/26/2016  . GERD (gastroesophageal reflux disease) 06/08/2016  . Constipation 06/08/2016  . Mucosal abnormality of stomach   . Mucosal abnormality of esophagus   . History of colonic polyps   . Diverticulosis of colon without hemorrhage   . Dysphagia 11/27/2015  . Rectal bleeding 11/27/2015  . Abdominal pain 11/27/2015  . Tobacco abuse 07/27/2015  . Chest pain at rest   . NSTEMI (non-ST elevated myocardial infarction) ([place])   . ACS (acute coronary syndrome) ([place]) 07/12/2015  . Hyperlipidemia 07/12/2015  . Chest pain 07/11/2015  . Benign essential HTN 07/11/2015  . LUMBAR SPRAIN AND STRAIN 06/08/2009    Past Surgical History:  Procedure Laterality Date  . CARDIAC CATHETERIZATION N/A 07/13/2015   Procedure: Left Heart Cath and Coronary Angiography;  Surgeon: Burnell Blanks, MD;  Location: [place] CV LAB;  Service: Cardiovascular;  Laterality: N/A;  . CARDIAC CATHETERIZATION N/A 07/13/2015   PCI + DES to the mid circ. LVEF was normal at 65%  . COLONOSCOPY N/A 12/31/2015   Dr.Rourk- diverticulosis,33mm polyp in the splenic flexure, 74mm polyp in the splenic flexure, 10mm polyp in the sigmoid colon bx= traditional serrated adenoma  . ESOPHAGOGASTRODUODENOSCOPY N/A 12/31/2015   Dr.Rourk- esophagitis with no bleeding, diffuse moderately erythematous mucosa without bleeding was found in the entire examined stomach. stomach bx= slight chronic inflammation. esophagus bx= benign gastresophageal  junction mucosa  . LEFT HEART CATH AND CORONARY ANGIOGRAPHY N/A 03/20/2017   Procedure: Left Heart Cath and Coronary Angiography;  Surgeon: Martinique, Peter M, MD;  Location: [place] CV LAB;  Service: Cardiovascular;  Laterality: N/A;  . Venia Minks DILATION N/A 12/31/2015   Procedure: Venia Minks DILATION;  Surgeon: [name], MD;  [facility];  Service: Endoscopy;  Laterality: N/A;        Home Medications    Prior to Admission medications   Medication Sig Start Date  End Date Taking? Authorizing Provider  albuterol (PROVENTIL) (2.5 MG/3ML) 0.083% nebulizer solution Take 3 mLs by nebulization daily as needed for shortness of breath. 08/04/16  Yes [provider]  aspirin 81 MG EC tablet Take 1 tablet (81 mg total) by mouth daily. 07/14/15  Yes Lyda Jester M, PA-C  clopidogrel (PLAVIX) 75 MG tablet Take 1 tablet by mouth daily. 12/29/17  Yes [provider]  dicyclomine (BENTYL) 10 MG capsule Take 1 capsule (10 mg total) by mouth 4 (four) times daily as needed for spasms. 06/29/17  Yes [name], NP  Glycerin-Hypromellose-PEG 400 (VISINE TEARS OP) Apply 1 drop to eye daily as needed (dry eyes).    Yes [provider]  isosorbide mononitrate (IMDUR) 60 MG 24 hr tablet Take 1.5 tablets (90 mg total) by mouth daily. 01/03/18 04/03/18 Yes Herminio Commons, MD  lisinopril (PRINIVIL,ZESTRIL) 2.5 MG tablet TAKE 1 TABLET BY MOUTH daily 12/29/17  Yes Herminio Commons, MD  metoprolol succinate (TOPROL-XL) 50 MG 24 hr tablet Take 75 mg (1 and 1/2 Tablet) Daily Each Morning. Take with or immediately following a meal. 01/03/18  Yes Herminio Commons, MD  NITROSTAT 0.4 MG SL tablet Place 1 tablet (0.4 mg total) under the tongue every 5 (five) minutes as needed for chest pain. Patient taking differently: Place 0.8 mg under the tongue every 5 (five) minutes as needed for chest pain.  12/20/17 01/19/18 Yes Shah, Pratik D, DO  pantoprazole (PROTONIX) 40 MG tablet Take 1 tablet (40 mg total) by mouth 2 (two) times daily before a meal. 07/26/17  Yes [name], NP  PROAIR HFA 108 (90 Base) MCG/ACT inhaler Inhale 2 puffs into the lungs every 4 (four) hours as needed for shortness of breath. 08/01/16  Yes [provider]  rosuvastatin (CRESTOR) 40 MG tablet Take 1 tablet (40 mg total) by mouth daily at 6 PM. 06/06/17  Yes Herminio Commons, MD  varenicline (CHANTIX STARTING MONTH PAK) 0.5 MG X 11 & 1 MG X 42 tablet Take one 0.5 mg tablet  by mouth once daily for 3 days, then increase to one 0.5 mg tablet twice daily for 4 days, then increase to one 1 mg tablet twice daily. 01/03/18  Yes Herminio Commons, MD    Family History Family History  Problem Relation Age of Onset  . Heart attack Father 84       Deceased  . Colon cancer Neg Hx     Social History Social History   Tobacco Use  . Smoking status: Current Every Day Smoker    Packs/day: 0.75    Years: 35.00    Pack years: 26.25    Types: Cigarettes    Start date: 08/08/1976  . Smokeless tobacco: Never Used  Substance Use Topics  . Alcohol use: No    Alcohol/week: 0.0 oz  . Drug use: No     Allergies   Patient has no known allergies.   Review  of Systems Review of Systems  Constitutional: Negative for diaphoresis and fever.  HENT: Negative for congestion.   Eyes: Negative for redness.  Respiratory: Negative for shortness of breath.   Cardiovascular: Positive for chest pain.  Gastrointestinal: Negative for abdominal pain.  Genitourinary: Negative for dysuria.  Musculoskeletal: Negative for back pain.  Skin: Negative for rash.  Neurological: Negative for headaches.  Hematological: Does not bruise/bleed easily.  Psychiatric/Behavioral: Negative for confusion.     Physical Exam Updated Vital Signs BP 113/78   Pulse (!) 52   Temp (!) 97.5 F (36.4 C) (Oral)   Resp 12   Ht 1.676 m (5\' 6" )   Wt 78.5 kg (173 lb)   SpO2 96%   BMI 27.92 kg/m   Physical Exam  Constitutional: He is oriented to person, place, and time. He appears well-developed and well-nourished. No distress.  HENT:  Head: Normocephalic and atraumatic.  Mouth/Throat: Oropharynx is clear and moist.  Eyes: Pupils are equal, round, and reactive to light. Conjunctivae and EOM are normal.  Neck: Normal range of motion. Neck supple.  Cardiovascular: Normal rate, regular rhythm and normal heart sounds.  Pulmonary/Chest: Effort normal and breath sounds normal. He exhibits no  tenderness.  Abdominal: Soft. Bowel sounds are normal. There is no tenderness.  Musculoskeletal: Normal range of motion. He exhibits no edema.  Neurological: He is alert and oriented to person, place, and time. No cranial nerve deficit or sensory deficit. He exhibits normal muscle tone. Coordination normal.  Skin: Skin is warm.  Nursing note and vitals reviewed.    ED Treatments / Results  Labs (all labs ordered are listed, but only abnormal results are displayed) Labs Reviewed  TROPONIN I    EKG EKG Interpretation  Date/Time:  Thursday January 11 2018 09:49:13 EDT Ventricular Rate:  50 PR Interval:    QRS Duration: 114 QT Interval:  441 QTC Calculation: 403 R Axis:   79 Text Interpretation:  Sinus rhythm Incomplete right bundle branch block No significant change since last tracing Confirmed by Fredia Sorrow [phone]) on 01/11/2018 9:59:47 AM   Radiology Dg Chest 2 View  Result Date: 01/11/2018 CLINICAL DATA:  Chest pain and shortness of breath for 2 hours. History of hypertension and coronary stents. EXAM: CHEST - 2 VIEW COMPARISON:  12/19/2017 FINDINGS: Heart size and pulmonary vascularity are normal. Lungs are clear. No airspace disease or consolidation. No blunting of costophrenic angles. No pneumothorax. Mediastinal contours appear intact. IMPRESSION: No active cardiopulmonary disease. Electronically Signed   By: Lucienne Capers M.D.   On: 01/11/2018 06:42    Procedures Procedures (including critical care time)  CRITICAL CARE Performed by: Fredia Sorrow Total critical care time: 30 minutes Critical care time was exclusive of separately billable procedures and treating other patients. Critical care was necessary to treat or prevent imminent or life-threatening deterioration. Critical care was time spent personally by me on the following activities: development of treatment plan with patient and/or surrogate as well as nursing, discussions with consultants, evaluation of  patient's response to treatment, examination of patient, obtaining history from patient or surrogate, ordering and performing treatments and interventions, ordering and review of laboratory studies, ordering and review of radiographic studies, pulse oximetry and re-evaluation of patient's condition.   Medications Ordered in ED Medications  0.9 %  sodium chloride infusion ( Intravenous New Bag/Given 01/11/18 1033)  nitroGLYCERIN (NITROSTAT) SL tablet 0.4 mg (0.4 mg Sublingual Given 01/11/18 1034)  aspirin chewable tablet 324 mg (324 mg Oral Given 01/11/18 1033)  ondansetron (ZOFRAN)  injection 4 mg (4 mg Intravenous Given 01/11/18 1033)  morphine 2 MG/ML injection 2 mg (2 mg Intravenous Given 01/11/18 1034)  morphine 2 MG/ML injection 2 mg (2 mg Intravenous Given 01/11/18 1314)     Initial Impression / Assessment and Plan / ED Course  I have reviewed the triage vital signs and the nursing notes.  Pertinent labs & imaging results that were available during my care of the patient were reviewed by me and considered in my medical decision making (see chart for details).     Patient with known coronary artery disease.  Patient followed by cardiology had recent visit the end of March.  Patient at times gets chest pain but is never had it last this long.  Patient also has a stent.  Patient with acute onset of left anterior chest pain and for this morning.  It has been continuous and has not resolved.  No change with nitroglycerin at home.  Patient went to [facility] weight was long had basic labs chest x-ray and troponin there without any acute findings.  Here patient got morphine nitroglycerin sublingual and a regular aspirin pain improved from a 10 to an 8 again not resolved.  EKG without acute changes.  Repeat troponin negative.  Sick to troponins negative with the persistent pain sounds unlikely that this would be true unstable angina.  However discussed with Dr. Harl Bowie cardiology.  Recommended to do CT of his  chest to rule just to rule out dissection although troponins are usually elevated in that.  If the chest pain does not resolve in the CT chest is negative patient can be admitted to medicine service as an unstable angina.  Based on that would start him on heparin.  And the hospitalist service could admit and they would reevaluate him in the morning to see whether they would want to do a cardiac cath.  Final Clinical Impressions(s) / ED Diagnoses   Final diagnoses:  Precordial pain    ED Discharge Orders    None       Fredia Sorrow, MD 01/11/18 1534   Addendum:  Patient's chest pain now down to 2.  CT angios chest and abdomen still pending.  We will go ahead and do a third troponin first 2 were negative.  Will give a third dose of morphine.  If chest pain resolves and CT anterior chest abdomen negative and third troponin negative patient can probably be discharged home and follow-up with cardiology.  Otherwise will probably need admission for rule out.   Patient stating that the pain is now down to a 2 and is coming and going and sometimes is completely gone.   Fredia Sorrow, MD 01/11/18 [phone]

## 2018-01-11 NOTE — ED Notes (Signed)
EDP at beside updating patient and family.

## 2018-01-11 NOTE — ED Triage Notes (Signed)
Pt reports that L sided CP started about 430, along with SOB, denies n/v, pt took two nitro without relief, hx of MI

## 2018-01-11 NOTE — ED Provider Notes (Signed)
Blood pressure 91/73, pulse 61, temperature (!) 97.5 F (36.4 C), temperature source Oral, resp. rate 13, height 5\' 6"  (1.676 m), weight 78.5 kg (173 lb), SpO2 95 %.  Assuming care from Dr. Rogene Houston.  In short, Anthony Hensley is a 57 y.o. male with a chief complaint of Chest Pain .  Refer to the original H&P for additional details.  The current plan of care is to f/u 3rd troponin and CT chest, abdomen, and pelvis. If labs and CT negative would consider admit for angina if pain continues but recently is better controlled with Morphine. Continue to follow.    EKG Interpretation  Date/Time:  Thursday January 11 2018 09:49:13 EDT Ventricular Rate:  50 PR Interval:    QRS Duration: 114 QT Interval:  441 QTC Calculation: 403 R Axis:   79 Text Interpretation:  Sinus rhythm Incomplete right bundle branch block No significant change since last tracing Confirmed by Fredia Sorrow [phone]) on 01/11/2018 9:59:47 AM       05:50 PM Patient's chest pain has completely resolved.  CT scan reviewed with no acute findings.  Per the plan developed by the initial EDP I will discharge the patient with plan to call his cardiologist first thing in the morning.  He will return to the emergency department if his pain suddenly worsens or returns.  Discussed the plan with the patient and family at bedside who are comfortable with this.  Nanda Quinton, MD   Margette Fast, MD 01/11/18 [phone]

## 2018-01-11 NOTE — Discharge Instructions (Signed)

## 2018-01-11 NOTE — ED Notes (Signed)
Labs drawn at [facility] this morning.

## 2018-01-11 NOTE — ED Notes (Signed)
Pt name called for room x 2 with no response. Pt unable to be located in lobby by myself or tech.

## 2018-01-11 NOTE — ED Notes (Signed)
EDP at bedside updating patient. 

## 2018-01-11 NOTE — ED Triage Notes (Signed)
Pt c/o left sided chest pain that started around 4am today.  Reports took 2 nitro without relief.  Pt went to [place] this morning but says he left because the wait was too long and he was hurting.

## 2018-01-12 ENCOUNTER — Telehealth: Payer: Self-pay | Admitting: Cardiovascular Disease

## 2018-01-12 NOTE — Telephone Encounter (Signed)
Pt sent home from ED .told to call us,next available with APP is 4/22.is that ok with you ?

## 2018-01-12 NOTE — Telephone Encounter (Signed)
Patient requesting appointment. States that he was seen in ER yesterday for chest pain. Advised patient of no availability . Asked to speak with nurse. / tg

## 2018-01-12 NOTE — Telephone Encounter (Signed)
Attempt to reach, got vm, lmtcb-- 

## 2018-01-12 NOTE — Telephone Encounter (Signed)
yes

## 2018-01-15 NOTE — Telephone Encounter (Signed)
4/8 Attempted to reach, line busy-cc

## 2018-01-16 NOTE — Telephone Encounter (Signed)
LMTCB-cc 

## 2018-01-19 NOTE — Telephone Encounter (Signed)
No response from pt to notify him of apt 4/22 with Bernerd Pho, ok'd by MD

## 2018-01-29 ENCOUNTER — Ambulatory Visit: Payer: Commercial Managed Care - PPO | Admitting: Student

## 2018-02-09 ENCOUNTER — Other Ambulatory Visit: Payer: Self-pay | Admitting: *Deleted

## 2018-02-09 MED ORDER — VARENICLINE TARTRATE 1 MG PO TABS: 1.0000 mg | ORAL_TABLET | Freq: Two times a day (BID) | ORAL | 0 refills | Status: DC

## 2018-02-24 ENCOUNTER — Other Ambulatory Visit: Payer: Self-pay | Admitting: Nurse Practitioner

## 2018-03-09 ENCOUNTER — Ambulatory Visit: Payer: Commercial Managed Care - PPO | Admitting: Cardiovascular Disease

## 2018-03-09 ENCOUNTER — Encounter: Payer: Self-pay | Admitting: Cardiovascular Disease

## 2018-03-09 VITALS — BP 126/84 | HR 62 | Ht 66.0 in | Wt 170.4 lb

## 2018-03-09 DIAGNOSIS — Z955 Presence of coronary angioplasty implant and graft: Secondary | ICD-10-CM

## 2018-03-09 DIAGNOSIS — R5383 Other fatigue: Secondary | ICD-10-CM

## 2018-03-09 DIAGNOSIS — E785 Hyperlipidemia, unspecified: Secondary | ICD-10-CM | POA: Diagnosis not present

## 2018-03-09 DIAGNOSIS — I10 Essential (primary) hypertension: Secondary | ICD-10-CM | POA: Diagnosis not present

## 2018-03-09 DIAGNOSIS — I25118 Atherosclerotic heart disease of native coronary artery with other forms of angina pectoris: Secondary | ICD-10-CM | POA: Diagnosis not present

## 2018-03-09 DIAGNOSIS — Z9289 Personal history of other medical treatment: Secondary | ICD-10-CM | POA: Diagnosis not present

## 2018-03-09 DIAGNOSIS — Z720 Tobacco use: Secondary | ICD-10-CM

## 2018-03-09 MED ORDER — VARENICLINE TARTRATE 1 MG PO TABS: 1.0000 mg | ORAL_TABLET | Freq: Two times a day (BID) | ORAL | 3 refills | Status: DC

## 2018-03-09 MED ORDER — METOPROLOL SUCCINATE ER 25 MG PO TB24: 25.0000 mg | ORAL_TABLET | Freq: Every day | ORAL | 3 refills | Status: DC

## 2018-03-09 NOTE — Addendum Note (Signed)
Addended by: Debbora Lacrosse R on: 03/09/2018 04:43 PM   Modules accepted: Orders

## 2018-03-09 NOTE — Addendum Note (Signed)
Addended by: Debbora Lacrosse R on: 03/09/2018 04:40 PM   Modules accepted: Orders

## 2018-03-09 NOTE — Patient Instructions (Signed)
Medication Instructions:  START TOPROL XL 25 MG DAILY  START CHANTIX 1 MG TWO TIMES DAILY   Labwork: NONE  Testing/Procedures: NONE  Follow-Up: Your physician recommends that you schedule a follow-up appointment in: 3 MONTHS    Any Other Special Instructions Will Be Listed Below (If Applicable).     If you need a refill on your cardiac medications before your next appointment, please call your pharmacy.

## 2018-03-09 NOTE — Progress Notes (Signed)
SUBJECTIVE: The patient presents for follow-up of coronary artery disease.  Echocardiogram on 12/19/17 showed normal left ventricular systolic function and regional wall motion, LVEF 55-60%.  Imdur was increased to 60 mg daily.  Coronary angiography on 03/20/17 demonstrated single vessel obstructive coronary disease with a 70% stenosis in the distal left circumflex which is a small vessel and unchanged from prior study.  There was continued patency of the stent in the mid left circumflex extending into the obtuse marginal branch.  He was evaluated for chest pain in the ED on 01/11/2018.  I personally reviewed all relevant documentation, labs, and studies.  Troponins were normal.  CT angiogram showed no evidence of aortic aneurysm or dissection as well as emphysema and a 6 mm slightly irregular nodule in the left upper lobe.  I personally reviewed the ECG which demonstrated sinus bradycardia, 50 bpm, with incomplete right bundle branch block.  He denies exertional chest pain.  He said for the past few months he has been feeling "rough".  He said he sleeps all the time and has lacks motivation to leave the house and go to work.  He denies lightheadedness, dizziness, palpitations, and syncope.  He has almost quit smoking and requests the last few doses of Chantix.  He has GERD and takes Protonix.  Review of Systems: As per "subjective", otherwise negative.  No Known Allergies  Current Outpatient Medications  Medication Sig Dispense Refill  . albuterol (PROVENTIL) (2.5 MG/3ML) 0.083% nebulizer solution Take 3 mLs by nebulization daily as needed for shortness of breath.  3  . aspirin 81 MG EC tablet Take 1 tablet (81 mg total) by mouth daily. 30 tablet   . clopidogrel (PLAVIX) 75 MG tablet Take 1 tablet by mouth daily.    Marland Kitchen dicyclomine (BENTYL) 10 MG capsule Take 1 capsule (10 mg total) by mouth 4 (four) times daily as needed for spasms. 90 capsule 1  . Glycerin-Hypromellose-PEG 400  (VISINE TEARS OP) Apply 1 drop to eye daily as needed (dry eyes).     . isosorbide mononitrate (IMDUR) 60 MG 24 hr tablet Take 1.5 tablets (90 mg total) by mouth daily. 90 tablet 11  . lisinopril (PRINIVIL,ZESTRIL) 2.5 MG tablet TAKE 1 TABLET BY MOUTH daily 30 tablet 6  . metoprolol succinate (TOPROL-XL) 50 MG 24 hr tablet Take 75 mg (1 and 1/2 Tablet) Daily Each Morning. Take with or immediately following a meal. 90 tablet 11  . NITROSTAT 0.4 MG SL tablet Place 1 tablet (0.4 mg total) under the tongue every 5 (five) minutes as needed for chest pain. (Patient taking differently: Place 0.8 mg under the tongue every 5 (five) minutes as needed for chest pain. ) 90 tablet 1  . pantoprazole (PROTONIX) 40 MG tablet TAKE 1 TABLET BY MOUTH 2 TIMES DAILY BEFORE A MEAL 180 tablet 2  . PROAIR HFA 108 (90 Base) MCG/ACT inhaler Inhale 2 puffs into the lungs every 4 (four) hours as needed for shortness of breath.  3  . rosuvastatin (CRESTOR) 40 MG tablet Take 1 tablet (40 mg total) by mouth daily at 6 PM. 90 tablet 2  . varenicline (CHANTIX CONTINUING MONTH PAK) 1 MG tablet Take 1 tablet (1 mg total) by mouth 2 (two) times daily. 60 tablet 0  . varenicline (CHANTIX STARTING MONTH PAK) 0.5 MG X 11 & 1 MG X 42 tablet Take one 0.5 mg tablet by mouth once daily for 3 days, then increase to one 0.5 mg tablet twice daily  for 4 days, then increase to one 1 mg tablet twice daily. 53 tablet 0   No current facility-administered medications for this visit.     Past Medical History:  Diagnosis Date  . Anginal pain ([place])   . Arthritis    " IN MY NECK & SHOULDERS "  . CAD (coronary artery disease)    DES to mid circumflex October 2016  . Diverticulosis   . Dyspnea     AT TIMES"  . GERD (gastroesophageal reflux disease)   . Hypercholesterolemia   . Hypertension   . NSTEMI (non-ST elevated myocardial infarction) [facility])    October 2016    Past Surgical History:  Procedure Laterality Date  . CARDIAC CATHETERIZATION  N/A 07/13/2015   Procedure: Left Heart Cath and Coronary Angiography;  Surgeon: Burnell Blanks, MD;  Location: [place] [facility];  Service: Cardiovascular;  Laterality: N/A;  . CARDIAC CATHETERIZATION N/A 07/13/2015   PCI + DES to the mid circ. LVEF was normal at 65%  . COLONOSCOPY N/A 12/31/2015   Dr.Rourk- diverticulosis,68m polyp in the splenic flexure, 939mpolyp in the splenic flexure, 66m84molyp in the sigmoid colon bx= traditional serrated adenoma  . ESOPHAGOGASTRODUODENOSCOPY N/A 12/31/2015   Dr.Rourk- esophagitis with no bleeding, diffuse moderately erythematous mucosa without bleeding was found in the entire examined stomach. stomach bx= slight chronic inflammation. esophagus bx= benign gastresophageal junction mucosa  . LEFT HEART CATH AND CORONARY ANGIOGRAPHY N/A 03/20/2017   Procedure: Left Heart Cath and Coronary Angiography;  Surgeon: JorMartiniqueeter M, MD;  Location: [facility];  Service: Cardiovascular;  Laterality: N/A;  . MALVenia MinksLATION N/A 12/31/2015   Procedure: MALVenia MinksLATION;  Surgeon: RobDaneil DolinD;  [facility];  Service: Endoscopy;  Laterality: N/A;    Social History   Socioeconomic History  . Marital status: Married    Spouse name: Not on file  . Number of children: Not on file  . Years of education: Not on file  . Highest education level: Not on file  Occupational History  . 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 Financial resource strain: Not on file  . Food insecurity:    Worry: Not on file    Inability: Not on file  . Transportation needs:    Medical: Not on file    Non-medical: Not on file  Tobacco Use  . Smoking status: Current Every Day Smoker    Packs/day: 0.75    Years: 35.00    Pack years: 26.25    Types: Cigarettes    Start date: 08/08/1976  . Smokeless tobacco: Never Used  Substance and Sexual Activity  . Alcohol use: No    Alcohol/week: 0.0 oz   . Drug use: No  . Sexual activity: Not on file  Lifestyle  . Physical activity:    Days per week: Not on file    Minutes per session: Not on file  . Stress: Not on file  Relationships  . Social connections:    Talks on phone: Not on file    Gets together: Not on file    Attends religious service: Not on file    Active member of club or organization: Not on file    Attends meetings of clubs or organizations: Not on file    Relationship status: Not on file  . Intimate partner violence:    Fear of current or ex partner: Not  on file    Emotionally abused: Not on file    Physically abused: Not on file    Forced sexual activity: Not on file  Other Topics Concern  . Not on file  Social History Narrative  . Not on file     Vitals:   03/09/18 1606  BP: 126/84  Pulse: 62  SpO2: 98%  Weight: 170 lb 6.4 oz (77.3 kg)  Height: '5\' 6"'  (1.676 m)    Wt Readings from Last 3 Encounters:  03/09/18 170 lb 6.4 oz (77.3 kg)  01/11/18 173 lb (78.5 kg)  01/11/18 173 lb (78.5 kg)     PHYSICAL EXAM General: NAD HEENT: Poor dentition. Neck: No JVD, no thyromegaly. Lungs: Clear to auscultation bilaterally with normal respiratory effort. CV: Bradycardic, regular rhythm, normal S1/S2, no S3/S4, no murmur. No pretibial or periankle edema.  No carotid bruit.   Abdomen: Soft, nontender, no distention.  Neurologic: Alert and oriented.  Psych: Normal affect. Skin: Normal. Musculoskeletal: No gross deformities.    ECG: Most recent ECG reviewed.   Labs: Lab Results  Component Value Date/Time   K 3.9 01/11/2018 06:25 AM   BUN 16 01/11/2018 06:25 AM   CREATININE 0.82 01/11/2018 06:25 AM   CREATININE 0.89 02/25/2016 03:29 PM   ALT 30 09/26/2016 01:39 AM   TSH 0.875 12/19/2017 04:53 PM   HGB 15.7 01/11/2018 06:25 AM     Lipids: Lab Results  Component Value Date/Time   LDLCALC 52 12/20/2017 05:10 AM   CHOL 111 12/20/2017 05:10 AM   TRIG 95 12/20/2017 05:10 AM   HDL 40 (L)  12/20/2017 05:10 AM       ASSESSMENT AND PLAN:  1.  Coronary disease with left circumflex stent and fatigue: Currently on aspirin, Imdur 90 mg, Toprol-XL 75 mg daily, and Crestor 40 mg.  I suspect his lethargy is due to beta-blockers.  I will reduce Toprol-XL to 25 mg daily.   2.  Hypertension: Controlled on present therapy.  I will monitor given decreased dose of metoprolol succinate.  3.  Hyperlipidemia: LDL 52 on 12/20/2017 which is at goal.  Continue Crestor 40 mg.  4.  Tobacco abuse: I previously prescribed a Chantix starter kit.  He has almost quit smoking.  I will prescribe Chantix 1 mg twice daily so he can complete treatment.     Disposition: Follow up 3 months  Time spent: 40 minutes, of which greater than 50% was spent reviewing symptoms, relevant blood tests and studies, and discussing management plan with the patient.    Kate Sable, M.D., F.A.C.C.

## 2018-03-24 ENCOUNTER — Other Ambulatory Visit: Payer: Self-pay | Admitting: Cardiovascular Disease

## 2018-07-20 ENCOUNTER — Other Ambulatory Visit: Payer: Self-pay | Admitting: Cardiovascular Disease

## 2018-08-04 ENCOUNTER — Other Ambulatory Visit: Payer: Self-pay | Admitting: Cardiovascular Disease

## 2018-09-03 DIAGNOSIS — M25551 Pain in right hip: Secondary | ICD-10-CM | POA: Diagnosis not present

## 2018-09-03 DIAGNOSIS — M7061 Trochanteric bursitis, right hip: Secondary | ICD-10-CM | POA: Diagnosis not present

## 2018-09-07 ENCOUNTER — Other Ambulatory Visit: Payer: Self-pay | Admitting: Cardiovascular Disease

## 2018-09-11 ENCOUNTER — Telehealth: Payer: Self-pay

## 2018-09-11 DIAGNOSIS — M25511 Pain in right shoulder: Secondary | ICD-10-CM | POA: Diagnosis not present

## 2018-09-11 NOTE — Telephone Encounter (Signed)
PA for Pantoprazole has been approved through [email or web address]. Pts pharmacy notified.

## 2018-09-11 NOTE — Telephone Encounter (Signed)
PA for Pantoprazole 40 mg has been completed on [email or web address]. Waiting on an approval or denial.

## 2018-09-18 ENCOUNTER — Other Ambulatory Visit: Payer: Self-pay | Admitting: Cardiovascular Disease

## 2018-09-24 NOTE — Telephone Encounter (Signed)
Plavix was STOPPED on 01/03/18 during office visit. I confirmed this with [facility]  I will call patient.   Patient notified

## 2018-09-26 DIAGNOSIS — M25511 Pain in right shoulder: Secondary | ICD-10-CM | POA: Diagnosis not present

## 2018-09-28 ENCOUNTER — Ambulatory Visit (INDEPENDENT_AMBULATORY_CARE_PROVIDER_SITE_OTHER): Payer: Commercial Managed Care - PPO | Admitting: Cardiovascular Disease

## 2018-09-28 ENCOUNTER — Encounter: Payer: Self-pay | Admitting: Cardiovascular Disease

## 2018-09-28 VITALS — BP 126/84 | HR 75 | Ht 66.0 in | Wt 180.0 lb

## 2018-09-28 DIAGNOSIS — I25118 Atherosclerotic heart disease of native coronary artery with other forms of angina pectoris: Secondary | ICD-10-CM

## 2018-09-28 DIAGNOSIS — Z955 Presence of coronary angioplasty implant and graft: Secondary | ICD-10-CM

## 2018-09-28 DIAGNOSIS — E785 Hyperlipidemia, unspecified: Secondary | ICD-10-CM | POA: Diagnosis not present

## 2018-09-28 DIAGNOSIS — I10 Essential (primary) hypertension: Secondary | ICD-10-CM

## 2018-09-28 NOTE — Progress Notes (Signed)
SUBJECTIVE: The patient presents for follow-up of coronary artery disease.  Echocardiogram on 12/19/17 showed normal left ventricular systolic function and regional wall motion, LVEF 55-60%. Imdur was increased to 60 mg daily.  Coronary angiography on 03/20/17 demonstrated single vessel obstructive coronary disease with a 70% stenosis in the distal left circumflex which is a small vessel and unchanged from prior study. There was continued patency of the stent in the mid left circumflex extending into the obtuse marginal branch.  He is doing very well overall.  He did injure his right arm doing some mechanical work about 3 weeks ago and is wearing a sling.  He has seldom had chest pains over the last several months.  He has used nitroglycerin 4 times.  He denies shortness of breath, palpitations, dizziness, and leg swelling.  He quit smoking in May 2019 and I congratulated him on his efforts.   Review of Systems: As per "subjective", otherwise negative.  No Known Allergies  Current Outpatient Medications  Medication Sig Dispense Refill  . albuterol (PROVENTIL) (2.5 MG/3ML) 0.083% nebulizer solution Take 3 mLs by nebulization daily as needed for shortness of breath.  3  . aspirin 81 MG EC tablet Take 1 tablet (81 mg total) by mouth daily. 30 tablet   . dicyclomine (BENTYL) 10 MG capsule Take 1 capsule (10 mg total) by mouth 4 (four) times daily as needed for spasms. 90 capsule 1  . Glycerin-Hypromellose-PEG 400 (VISINE TEARS OP) Apply 1 drop to eye daily as needed (dry eyes).     Marland Kitchen lisinopril (PRINIVIL,ZESTRIL) 2.5 MG tablet TAKE 1 TABLET BY MOUTH daily 30 tablet 11  . metoprolol succinate (TOPROL-XL) 50 MG 24 hr tablet Take 50 mg by mouth daily. Take with or immediately following a meal.    . NITROSTAT 0.4 MG SL tablet Place 1 tablet (0.4 mg total) under the tongue every 5 (five) minutes as needed for chest pain. (Patient taking differently: Place 0.8 mg under the tongue every 5  (five) minutes as needed for chest pain. ) 90 tablet 1  . pantoprazole (PROTONIX) 40 MG tablet TAKE 1 TABLET BY MOUTH 2 TIMES DAILY BEFORE A MEAL 180 tablet 2  . PROAIR HFA 108 (90 Base) MCG/ACT inhaler Inhale 2 puffs into the lungs every 4 (four) hours as needed for shortness of breath.  3  . rosuvastatin (CRESTOR) 40 MG tablet TAKE 1 TABLET BY MOUTH EVERY DAY AT 6 pm 90 tablet 2   No current facility-administered medications for this visit.     Past Medical History:  Diagnosis Date  . Anginal pain ([place])   . Arthritis    " IN MY NECK & SHOULDERS "  . CAD (coronary artery disease)    DES to mid circumflex October 2016  . Diverticulosis   . Dyspnea     AT TIMES"  . GERD (gastroesophageal reflux disease)   . Hypercholesterolemia   . Hypertension   . NSTEMI (non-ST elevated myocardial infarction) [facility])    October 2016    Past Surgical History:  Procedure Laterality Date  . CARDIAC CATHETERIZATION N/A 07/13/2015   Procedure: Left Heart Cath and Coronary Angiography;  Surgeon: Burnell Blanks, MD;  Location: [place] [facility];  Service: Cardiovascular;  Laterality: N/A;  . CARDIAC CATHETERIZATION N/A 07/13/2015   PCI + DES to the mid circ. LVEF was normal at 65%  . COLONOSCOPY N/A 12/31/2015   Dr.Rourk- diverticulosis,6mm polyp in the splenic flexure, 34mm polyp in the splenic  flexure, 86mm polyp in the sigmoid colon bx= traditional serrated adenoma  . ESOPHAGOGASTRODUODENOSCOPY N/A 12/31/2015   Dr.Rourk- esophagitis with no bleeding, diffuse moderately erythematous mucosa without bleeding was found in the entire examined stomach. stomach bx= slight chronic inflammation. esophagus bx= benign gastresophageal junction mucosa  . LEFT HEART CATH AND CORONARY ANGIOGRAPHY N/A 03/20/2017   Procedure: Left Heart Cath and Coronary Angiography;  Surgeon: Martinique, Peter M, MD;  Location: [place] [facility];  Service: Cardiovascular;  Laterality: N/A;  . Venia Minks DILATION N/A 12/31/2015    Procedure: Venia Minks DILATION;  Surgeon: [name], MD;  [facility];  Service: Endoscopy;  Laterality: N/A;    Social History   Socioeconomic History  . Marital status: Married    Spouse name: Not on file  . Number of children: Not on file  . Years of education: Not on file  . Highest education level: Not on file  Occupational History  . 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 Financial resource strain: Not on file  . Food insecurity:    Worry: Not on file    Inability: Not on file  . Transportation needs:    Medical: Not on file    Non-medical: Not on file  Tobacco Use  . Smoking status: Former Smoker    Packs/day: 0.75    Years: 35.00    Pack years: 26.25    Types: Cigarettes    Start date: 08/08/1976    Last attempt to quit: 02/21/2018    Years since quitting: 0.6  . Smokeless tobacco: Never Used  Substance and Sexual Activity  . Alcohol use: No    Alcohol/week: 0.0 standard drinks  . Drug use: No  . Sexual activity: Not on file  Lifestyle  . Physical activity:    Days per week: Not on file    Minutes per session: Not on file  . Stress: Not on file  Relationships  . Social connections:    Talks on phone: Not on file    Gets together: Not on file    Attends religious service: Not on file    Active member of club or organization: Not on file    Attends meetings of clubs or organizations: Not on file    Relationship status: Not on file  . Intimate partner violence:    Fear of current or ex partner: Not on file    Emotionally abused: Not on file    Physically abused: Not on file    Forced sexual activity: Not on file  Other Topics Concern  . Not on file  Social History Narrative  . Not on file     Vitals:   09/28/18 1604  BP: 126/84  Pulse: 75  SpO2: 98%  Weight: 180 lb (81.6 kg)  Height: 5\' 6"  (1.676 m)    Wt Readings from Last 3 Encounters:  09/28/18 180 lb (81.6 kg)   03/09/18 170 lb 6.4 oz (77.3 kg)  01/11/18 173 lb (78.5 kg)     PHYSICAL EXAM General: NAD HEENT: Normal. Neck: No JVD, no thyromegaly. Lungs: Clear to auscultation bilaterally with normal respiratory effort. CV: Regular rate and rhythm, normal S1/S2, no S3/S4, no murmur. No pretibial or periankle edema.  No carotid bruit.   Abdomen: Soft, nontender, no distention.  Neurologic: Alert and oriented.  Psych: Normal affect. Skin: Normal. Musculoskeletal: No gross deformities.    ECG: Reviewed above under  Subjective   Labs: Lab Results  Component Value Date/Time   K 3.9 01/11/2018 06:25 AM   BUN 16 01/11/2018 06:25 AM   CREATININE 0.82 01/11/2018 06:25 AM   CREATININE 0.89 02/25/2016 03:29 PM   ALT 30 09/26/2016 01:39 AM   TSH 0.875 12/19/2017 04:53 PM   HGB 15.7 01/11/2018 06:25 AM     Lipids: Lab Results  Component Value Date/Time   LDLCALC 52 12/20/2017 05:10 AM   CHOL 111 12/20/2017 05:10 AM   TRIG 95 12/20/2017 05:10 AM   HDL 40 (L) 12/20/2017 05:10 AM       ASSESSMENT AND PLAN: 1. Coronary artery disease: He has a left circumflex stent and is currently taking aspirin, Toprol-XL 50 mgdaily, and Crestor 40 mg. He is no longer on Imdur. Symptoms are stable.  2. Hypertension: Controlled on present therapy.   Changes to therapy.  3. Hyperlipidemia: LDL 52 on 12/20/2017 which is at goal. Continue Crestor 40 mg.   Disposition: Follow up 6 months   Kate Sable, M.D., F.A.C.C.

## 2018-09-28 NOTE — Patient Instructions (Signed)
Medication Instructions:  Your physician recommends that you continue on your current medications as directed. Please refer to the Current Medication list given to you today.  If you need a refill on your cardiac medications before your next appointment, please call your pharmacy.   Lab work: NONE  If you have labs (blood work) drawn today and your tests are completely normal, you will receive your results only by: Marland Kitchen MyChart Message (if you have MyChart) OR . A paper copy in the mail If you have any lab test that is abnormal or we need to change your treatment, we will call you to review the results.  Testing/Procedures: NONE   Follow-Up: At [facility], you and your health needs are our priority.  As part of our continuing mission to provide you with exceptional heart care, we have created designated Provider Care Teams.  These Care Teams include your primary Cardiologist (physician) and Advanced Practice Providers (APPs -  Physician Assistants and Nurse Practitioners) who all work together to provide you with the care you need, when you need it. . Your physician wants you to follow-up in: 6 Months. You will receive a reminder letter in the mail two months in advance. If you don't receive a letter, please call our office to schedule the follow-up appointment. .   Any Other Special Instructions Will Be Listed Below (If Applicable). Thank you for choosing [facility]!

## 2018-10-12 DIAGNOSIS — M25511 Pain in right shoulder: Secondary | ICD-10-CM | POA: Diagnosis not present

## 2018-11-21 ENCOUNTER — Encounter: Payer: Self-pay | Admitting: Internal Medicine

## 2018-12-24 ENCOUNTER — Other Ambulatory Visit: Payer: Self-pay | Admitting: Nurse Practitioner

## 2019-01-10 ENCOUNTER — Telehealth: Payer: Self-pay | Admitting: Cardiovascular Disease

## 2019-01-10 NOTE — Telephone Encounter (Signed)
Patient has c/o dizziness and claims he has "almost fallen" several times today. Please advise. / tg

## 2019-01-10 NOTE — Telephone Encounter (Signed)
Agree, need to start with vital signs.

## 2019-01-10 NOTE — Telephone Encounter (Signed)
Stumbled after getting at 5 am, wife says he has had dizziness when he went to the bathroom, when he went to walk in the kitchen , he spilled coffee on himself. Has been lying in bed all day. Wife just came home and that is why she calls now.She states he is under a lot of stress at work. She has no BP machine at home. He is drinking fluids and resting now.I told wife if symptoms woresen, he falls or passes out to go to the ED      I will [place]

## 2019-01-11 NOTE — Telephone Encounter (Signed)
Attempt to reach, line [id number] 

## 2019-01-11 NOTE — Telephone Encounter (Signed)
Attempt to reach, [id number] 

## 2019-01-13 ENCOUNTER — Encounter (HOSPITAL_COMMUNITY): Payer: Self-pay | Admitting: Emergency Medicine

## 2019-01-13 ENCOUNTER — Emergency Department (HOSPITAL_COMMUNITY): Payer: Commercial Managed Care - PPO

## 2019-01-13 ENCOUNTER — Emergency Department (HOSPITAL_COMMUNITY)
Admission: EM | Admit: 2019-01-13 | Discharge: 2019-01-13 | Disposition: A | Discharge status: Home / Self Care | Payer: Commercial Managed Care - PPO | Attending: Emergency Medicine | Admitting: Emergency Medicine

## 2019-01-13 ENCOUNTER — Other Ambulatory Visit: Payer: Self-pay

## 2019-01-13 DIAGNOSIS — Z7982 Long term (current) use of aspirin: Secondary | ICD-10-CM | POA: Diagnosis not present

## 2019-01-13 DIAGNOSIS — I2510 Atherosclerotic heart disease of native coronary artery without angina pectoris: Secondary | ICD-10-CM | POA: Diagnosis not present

## 2019-01-13 DIAGNOSIS — I10 Essential (primary) hypertension: Secondary | ICD-10-CM | POA: Insufficient documentation

## 2019-01-13 DIAGNOSIS — Z87891 Personal history of nicotine dependence: Secondary | ICD-10-CM | POA: Diagnosis not present

## 2019-01-13 DIAGNOSIS — Z79899 Other long term (current) drug therapy: Secondary | ICD-10-CM | POA: Insufficient documentation

## 2019-01-13 DIAGNOSIS — R42 Dizziness and giddiness: Secondary | ICD-10-CM | POA: Insufficient documentation

## 2019-01-13 LAB — URINALYSIS, ROUTINE W REFLEX MICROSCOPIC
Bilirubin Urine: NEGATIVE
Glucose, UA: NEGATIVE mg/dL
Ketones, ur: NEGATIVE mg/dL
Leukocytes,Ua: NEGATIVE
Nitrite: NEGATIVE
Protein, ur: NEGATIVE mg/dL
Specific Gravity, Urine: 1.021 (ref 1.005–1.030)
pH: 6 (ref 5.0–8.0)

## 2019-01-13 LAB — COMPREHENSIVE METABOLIC PANEL
ALT: 29 U/L (ref 0–44)
AST: 21 U/L (ref 15–41)
Albumin: 4 g/dL (ref 3.5–5.0)
Alkaline Phosphatase: 61 U/L (ref 38–126)
Anion gap: 6 (ref 5–15)
BUN: 21 mg/dL — ABNORMAL HIGH (ref 6–20)
CO2: 26 mmol/L (ref 22–32)
Calcium: 9 mg/dL (ref 8.9–10.3)
Chloride: 107 mmol/L (ref 98–111)
Creatinine, Ser: 0.87 mg/dL (ref 0.61–1.24)
GFR calc Af Amer: >60 mL/min (ref >60)
GFR calc non Af Amer: >60 mL/min (ref >60)
Glucose, Bld: 117 mg/dL — ABNORMAL HIGH (ref 70–99)
Potassium: 3.9 mmol/L (ref 3.5–5.1)
Sodium: 139 mmol/L (ref 135–145)
Total Bilirubin: 0.2 mg/dL — ABNORMAL LOW (ref 0.3–1.2)
Total Protein: 6.6 g/dL (ref 6.5–8.1)

## 2019-01-13 LAB — CBC WITH DIFFERENTIAL/PLATELET
Abs Immature Granulocytes: 0.01 10*3/uL (ref 0.00–0.07)
Basophils Absolute: 0 10*3/uL (ref 0.0–0.1)
Basophils Relative: 1 %
Eosinophils Absolute: 0.1 10*3/uL (ref 0.0–0.5)
Eosinophils Relative: 2 %
HCT: 42.8 % (ref 39.0–52.0)
Hemoglobin: 14 g/dL (ref 13.0–17.0)
Immature Granulocytes: 0 %
Lymphocytes Relative: 34 %
Lymphs Abs: 1.9 10*3/uL (ref 0.7–4.0)
MCH: 30.6 pg (ref 26.0–34.0)
MCHC: 32.7 g/dL (ref 30.0–36.0)
MCV: 93.4 fL (ref 80.0–100.0)
Monocytes Absolute: 0.4 10*3/uL (ref 0.1–1.0)
Monocytes Relative: 7 %
Neutro Abs: 3.2 10*3/uL (ref 1.7–7.7)
Neutrophils Relative %: 56 %
Platelets: 186 10*3/uL (ref 150–400)
RBC: 4.58 MIL/uL (ref 4.22–5.81)
RDW: 12.1 % (ref 11.5–15.5)
WBC: 5.7 10*3/uL (ref 4.0–10.5)
nRBC: 0 % (ref 0.0–0.2)

## 2019-01-13 MED ORDER — MECLIZINE HCL 12.5 MG PO TABS
25.0000 mg | ORAL_TABLET | Freq: Once | ORAL | Status: AC
  Administered 2019-01-13: 25 mg via ORAL
  Filled 2019-01-13: qty 2

## 2019-01-13 MED ORDER — PREDNISONE 20 MG PO TABS
40.0000 mg | ORAL_TABLET | Freq: Once | ORAL | Status: AC
Start: 2019-01-13 — End: 2019-01-13
  Administered 2019-01-13: 23:00:00 40 mg via ORAL
  Filled 2019-01-13: qty 2

## 2019-01-13 MED ORDER — MECLIZINE HCL 12.5 MG PO TABS: 12.5000 mg | ORAL_TABLET | Freq: Three times a day (TID) | ORAL | 0 refills | Status: DC | PRN

## 2019-01-13 MED ORDER — DEXAMETHASONE 4 MG PO TABS: 4.0000 mg | ORAL_TABLET | Freq: Two times a day (BID) | ORAL | 0 refills | Status: DC

## 2019-01-13 NOTE — Discharge Instructions (Addendum)
Your electrolytes, complete blood count, and urine analysis are all negative.  Your chest x-ray is negative.  Your electrocardiogram is negative for acute event.  Your oxygen level has been between 96 and 97% during your emergency department visit today.  Your examination favors a possible inner ear issue causing your dizziness.  Please use Decadron 2 times daily with food.  Please use Antivert 3 times daily.  Antivert may cause drowsiness.  Please do not drive a vehicle, operate machinery, drink alcohol, or participate in activities requiring concentration when taking this medication.  Please follow-up with Anthony Hensley in the office.  Return to the emergency department if any worsening of your symptoms, changes in your condition, problems, or concerns.

## 2019-01-13 NOTE — ED Provider Notes (Signed)
[facility] EMERGENCY DEPARTMENT Provider Note   CSN: [id number] Arrival date & time: 01/13/19  1949    History   Chief Complaint Chief Complaint  Patient presents with  . Dizziness    HPI KEIONDRE COLEE is a 58 y.o. male.     Patient is a 58 year old male who presents to the emergency department with a complaint of dizziness.  The patient states this problem started Thursday, April 2.  He began to notice that when he change positions, that he had a sensation of being off balance and falling.  He has not had any falls.  He denies any ringing in his ears.  He is not had any vomiting.  He has not had any loss of consciousness.  No recent injury to the head or neck.  No recent changes in medication or diet.  No fever reported.  Mild sinus congestion, but no new sinus problems.  No chest pain or sensation of  Palpitations.  The patient states that he has been checking his blood pressure during the day today, and at times it seems to be low and then at times it seems to be high.  He became frightened about this and came to the emergency department for evaluation.  The history is provided by the patient.    Past Medical History:  Diagnosis Date  . Anginal pain ([place])   . Arthritis    " IN MY NECK & SHOULDERS "  . CAD (coronary artery disease)    DES to mid circumflex October 2016  . Diverticulosis   . Dyspnea     AT TIMES"  . GERD (gastroesophageal reflux disease)   . Hypercholesterolemia   . Hypertension   . NSTEMI (non-ST elevated myocardial infarction) [facility])    October 2016    Patient Active Problem List   Diagnosis Date Noted  . Coronary artery disease involving native heart with angina pectoris ([place]) 03/20/2017  . CAD (coronary artery disease) 03/20/2017  . Unstable angina ([place])   . Coronary artery disease involving native coronary artery of native heart with angina pectoris ([place]) 03/17/2017  . Coronary artery disease involving native artery of transplanted heart without  angina pectoris 03/16/2017  . Precordial chest pain   . Diarrhea 09/26/2016  . Enteritis 09/26/2016  . GERD (gastroesophageal reflux disease) 06/08/2016  . Constipation 06/08/2016  . Mucosal abnormality of stomach   . Mucosal abnormality of esophagus   . History of colonic polyps   . Diverticulosis of colon without hemorrhage   . Dysphagia 11/27/2015  . Rectal bleeding 11/27/2015  . Abdominal pain 11/27/2015  . Tobacco abuse 07/27/2015  . Chest pain at rest   . NSTEMI (non-ST elevated myocardial infarction) ([place])   . ACS (acute coronary syndrome) ([place]) 07/12/2015  . Hyperlipidemia 07/12/2015  . Chest pain 07/11/2015  . Benign essential HTN 07/11/2015  . LUMBAR SPRAIN AND STRAIN 06/08/2009    Past Surgical History:  Procedure Laterality Date  . CARDIAC CATHETERIZATION N/A 07/13/2015   Procedure: Left Heart Cath and Coronary Angiography;  Surgeon: Burnell Blanks, MD;  Location: [place] [facility];  Service: Cardiovascular;  Laterality: N/A;  . CARDIAC CATHETERIZATION N/A 07/13/2015   PCI + DES to the mid circ. LVEF was normal at 65%  . COLONOSCOPY N/A 12/31/2015   Dr.Rourk- diverticulosis,57mm polyp in the splenic flexure, 44mm polyp in the splenic flexure, 37mm polyp in the sigmoid colon bx= traditional serrated adenoma  . ESOPHAGOGASTRODUODENOSCOPY N/A 12/31/2015   Dr.Rourk- esophagitis with no  bleeding, diffuse moderately erythematous mucosa without bleeding was found in the entire examined stomach. stomach bx= slight chronic inflammation. esophagus bx= benign gastresophageal junction mucosa  . LEFT HEART CATH AND CORONARY ANGIOGRAPHY N/A 03/20/2017   Procedure: Left Heart Cath and Coronary Angiography;  Surgeon: Martinique, Peter M, MD;  Location: [place] [facility];  Service: Cardiovascular;  Laterality: N/A;  . Venia Minks DILATION N/A 12/31/2015   Procedure: Venia Minks DILATION;  Surgeon: [name], MD;  [facility];  Service: Endoscopy;  Laterality: N/A;         Home Medications    Prior to Admission medications   Medication Sig Start Date End Date Taking? Authorizing Provider  albuterol (PROVENTIL) (2.5 MG/3ML) 0.083% nebulizer solution Take 3 mLs by nebulization daily as needed for shortness of breath. 08/04/16   [provider]  aspirin 81 MG EC tablet Take 1 tablet (81 mg total) by mouth daily. 07/14/15   Lyda Jester M, PA-C  dicyclomine (BENTYL) 10 MG capsule Take 1 capsule (10 mg total) by mouth 4 (four) times daily as needed for spasms. 06/29/17   [name], NP  Glycerin-Hypromellose-PEG 400 (VISINE TEARS OP) Apply 1 drop to eye daily as needed (dry eyes).     [provider]  lisinopril (PRINIVIL,ZESTRIL) 2.5 MG tablet TAKE 1 TABLET BY MOUTH daily 07/20/18   Herminio Commons, MD  metoprolol succinate (TOPROL-XL) 50 MG 24 hr tablet Take 50 mg by mouth daily. Take with or immediately following a meal.    [provider]  NITROSTAT 0.4 MG SL tablet Place 1 tablet (0.4 mg total) under the tongue every 5 (five) minutes as needed for chest pain. Patient taking differently: Place 0.8 mg under the tongue every 5 (five) minutes as needed for chest pain.  12/20/17 03/09/18  Manuella Ghazi, Pratik D, DO  pantoprazole (PROTONIX) 40 MG tablet TAKE 1 TABLET BY MOUTH 2 TIMES DAILY BEFORE A MEAL 12/26/18   [name], NP  PROAIR HFA [phone] Base) MCG/ACT inhaler Inhale 2 puffs into the lungs every 4 (four) hours as needed for shortness of breath. 08/01/16   [provider]  rosuvastatin (CRESTOR) 40 MG tablet TAKE 1 TABLET BY MOUTH EVERY DAY AT 6 pm 03/26/18   Herminio Commons, MD    Family History Family History  Problem Relation Age of Onset  . Heart attack Father 70       Deceased  . Colon cancer Neg Hx     Social History Social History   Tobacco Use  . Smoking status: Former Smoker    Packs/day: 0.75    Years: 35.00    Pack years: 26.25    Types: Cigarettes    Start date: 08/08/1976    Last attempt to  quit: 02/21/2018    Years since quitting: 0.8  . Smokeless tobacco: Never Used  Substance Use Topics  . Alcohol use: No    Alcohol/week: 0.0 standard drinks  . Drug use: No     Allergies   Patient has no known allergies.   Review of Systems Review of Systems  Constitutional: Negative for activity change, appetite change, chills, fatigue and fever.       All ROS Neg except as noted in HPI  HENT: Positive for congestion. Negative for nosebleeds.   Eyes: Negative for photophobia and discharge.  Respiratory: Negative for cough, shortness of breath and wheezing.   Cardiovascular: Negative for chest pain and palpitations.  Gastrointestinal: Negative for abdominal pain and  blood in stool.  Genitourinary: Negative for dysuria, frequency and hematuria.  Musculoskeletal: Negative for arthralgias, back pain and neck pain.  Skin: Negative.   Neurological: Positive for dizziness. Negative for seizures and speech difficulty.  Psychiatric/Behavioral: Negative for confusion and hallucinations.     Physical Exam Updated Vital Signs BP (!) 144/91 (BP Location: Left Arm)   Pulse 64   Temp (!) 97.4 F (36.3 C) (Oral)   Resp 16   Ht 5\' 6"  (1.676 m)   Wt 70.3 kg   SpO2 97%   BMI 25.02 kg/m   Physical Exam Vitals signs and nursing note reviewed.  Constitutional:      Appearance: He is well-developed. He is not toxic-appearing.  HENT:     Head: Normocephalic.     Right Ear: Tympanic membrane and external ear normal.     Left Ear: Tympanic membrane and external ear normal.     Nose:     Comments: Mild congestion present. Eyes:     General: Lids are normal.     Extraocular Movements:     Right eye: Nystagmus present.     Left eye: Nystagmus present.     Pupils: Pupils are equal, round, and reactive to light.     Comments: Horizontal nystagmus noted.  Neck:     Musculoskeletal: Normal range of motion and neck supple.     Vascular: No carotid bruit.  Cardiovascular:     Rate and  Rhythm: Normal rate and regular rhythm.     Pulses: Normal pulses.     Heart sounds: Normal heart sounds.  Pulmonary:     Effort: No respiratory distress.     Breath sounds: Normal breath sounds.  Abdominal:     General: Bowel sounds are normal.     Palpations: Abdomen is soft.     Tenderness: There is no abdominal tenderness. There is no guarding.  Musculoskeletal: Normal range of motion.  Lymphadenopathy:     Head:     Right side of head: No submandibular adenopathy.     Left side of head: No submandibular adenopathy.     Cervical: No cervical adenopathy.  Skin:    General: Skin is warm and dry.  Neurological:     Mental Status: He is alert and oriented to person, place, and time.     Cranial Nerves: No cranial nerve deficit.     Sensory: No sensory deficit.  Psychiatric:        Speech: Speech normal.      ED Treatments / Results  Labs (all labs ordered are listed, but only abnormal results are displayed) Labs Reviewed  COMPREHENSIVE METABOLIC PANEL  URINALYSIS, ROUTINE W REFLEX MICROSCOPIC  CBC WITH DIFFERENTIAL/PLATELET    EKG None  Radiology No results found.  Procedures Procedures (including critical care time)  Medications Ordered in ED Medications - No data to display   Initial Impression / Assessment and Plan / ED Course  I have reviewed the triage vital signs and the nursing notes.  Pertinent labs & imaging results that were available during my care of the patient were reviewed by me and considered in my medical decision making (see chart for details).      Final Clinical Impressions(s) / ED Diagnose      MDM Vital signs reviewed. Pulse ox 97% on room air. WNL by my interpretation.  Blood pressure 144/91 in ED.  Patient describes more of a sensation of falling or disequilibrium.  He denies any recent migraine headache.  He says he has had some sinus congestion.  The problem is worse when he is changing positions.  It actually gets a little  better when he is laying still.  There is been no hearing loss.  No ringing in the ear.  No recent injury to the head or neck.  Chest x-ray is negative for acute changes.  Antivert ordered.  The comprehensive metabolic panel is negative for acute changes.  The complete blood count is negative for acute changes.  Patient states he is feeling a little better, still has some dizziness and sensation of being off balance when he stands or changes position.  Urine analysis is negative for acute infection or problem.  I discussed with the patient the findings from his examination.  The patient will be treated as an outpatient with Antivert and Decadron.  I have asked him to increase his fluids.  He is to follow-up with his primary physician in the office in about a week.  He is to return to the emergency department if any worsening of his symptoms, changes in his condition, problems, or concerns.  Patient is in agreement with this plan.   Final diagnoses:  Dizziness    ED Discharge Orders    None       Lily Kocher, Hershal Coria 01/13/19 2339    Isla Pence, MD 01/14/19 0001

## 2019-01-13 NOTE — ED Triage Notes (Signed)
Patient states he has been having blood pressure problems since Thursday and is having dizziness today. Patient states his blood pressure has been high and low at times. Current BP 144/91.

## 2019-01-14 NOTE — Telephone Encounter (Signed)
Attempt to reach, LMTCB-cc 

## 2019-01-23 ENCOUNTER — Telehealth: Payer: Self-pay | Admitting: Cardiovascular Disease

## 2019-01-23 NOTE — Telephone Encounter (Signed)
Patient is returning a call to Vernie Murders, Therapist, sports.

## 2019-01-24 NOTE — Telephone Encounter (Signed)
Attempt to reach pt, got vm,lmtcb-cc

## 2019-01-29 NOTE — Telephone Encounter (Signed)
Final attempt to reach patient, got voicemail. I will close phone note

## 2019-02-09 ENCOUNTER — Other Ambulatory Visit: Payer: Self-pay | Admitting: Cardiovascular Disease

## 2019-02-20 ENCOUNTER — Other Ambulatory Visit: Payer: Self-pay | Admitting: Cardiovascular Disease

## 2019-03-20 ENCOUNTER — Other Ambulatory Visit: Payer: Self-pay | Admitting: Cardiovascular Disease

## 2019-06-10 ENCOUNTER — Encounter: Payer: Self-pay | Admitting: Cardiovascular Disease

## 2019-06-10 ENCOUNTER — Other Ambulatory Visit: Payer: Self-pay

## 2019-06-10 ENCOUNTER — Ambulatory Visit: Payer: Commercial Managed Care - PPO | Admitting: Cardiovascular Disease

## 2019-06-10 VITALS — BP 139/92 | HR 77 | Temp 97.5°F | Ht 66.0 in | Wt 203.0 lb

## 2019-06-10 DIAGNOSIS — Z955 Presence of coronary angioplasty implant and graft: Secondary | ICD-10-CM

## 2019-06-10 DIAGNOSIS — I10 Essential (primary) hypertension: Secondary | ICD-10-CM | POA: Diagnosis not present

## 2019-06-10 DIAGNOSIS — E785 Hyperlipidemia, unspecified: Secondary | ICD-10-CM

## 2019-06-10 DIAGNOSIS — I25118 Atherosclerotic heart disease of native coronary artery with other forms of angina pectoris: Secondary | ICD-10-CM | POA: Diagnosis not present

## 2019-06-10 MED ORDER — NITROGLYCERIN 0.4 MG SL SUBL: 0.4000 mg | SUBLINGUAL_TABLET | SUBLINGUAL | 3 refills | Status: DC | PRN

## 2019-06-10 NOTE — Progress Notes (Signed)
SUBJECTIVE: The patient presents for follow-up of coronary artery disease.  Echocardiogram on 12/19/17 showed normal left ventricular systolic function and regional wall motion, LVEF 55-60%. Imdur was increased to 60 mg daily.  Coronary angiography on 03/20/17 demonstrated single vessel obstructive coronary disease with a 70% stenosis in the distal left circumflex which is a small vessel and unchanged from prior study. There was continued patency of the stent in the mid left circumflex extending into the obtuse marginal branch.  He denies exertional chest pain and dyspnea.  He denies leg swelling.  He wants to exercise and lose some weight.  He is thinking about switching careers.    Review of Systems: As per "subjective", otherwise negative.  No Known Allergies  Current Outpatient Medications  Medication Sig Dispense Refill  . albuterol (PROVENTIL) (2.5 MG/3ML) 0.083% nebulizer solution Take 3 mLs by nebulization daily as needed for shortness of breath.  3  . aspirin 81 MG EC tablet Take 1 tablet (81 mg total) by mouth daily. 30 tablet   . dexamethasone (DECADRON) 4 MG tablet Take 1 tablet (4 mg total) by mouth 2 (two) times daily with a meal. 10 tablet 0  . dicyclomine (BENTYL) 10 MG capsule Take 1 capsule (10 mg total) by mouth 4 (four) times daily as needed for spasms. 90 capsule 1  . Glycerin-Hypromellose-PEG 400 (VISINE TEARS OP) Apply 1 drop to eye daily as needed (dry eyes).     . isosorbide mononitrate (IMDUR) 60 MG 24 hr tablet TAKE ONE AND A HALF TABLETS BY MOUTH EVERY DAY 45 tablet 6  . lisinopril (PRINIVIL,ZESTRIL) 2.5 MG tablet TAKE 1 TABLET BY MOUTH daily 30 tablet 11  . meclizine (ANTIVERT) 12.5 MG tablet Take 1 tablet (12.5 mg total) by mouth 3 (three) times daily as needed for dizziness. 30 tablet 0  . metoprolol succinate (TOPROL-XL) 25 MG 24 hr tablet TAKE 1 TABLET BY MOUTH EVERY DAY 90 tablet 3  . metoprolol succinate (TOPROL-XL) 50 MG 24 hr tablet Take 50 mg  by mouth daily. Take with or immediately following a meal.    . NITROSTAT 0.4 MG SL tablet Place 1 tablet (0.4 mg total) under the tongue every 5 (five) minutes as needed for chest pain. (Patient taking differently: Place 0.8 mg under the tongue every 5 (five) minutes as needed for chest pain. ) 90 tablet 1  . pantoprazole (PROTONIX) 40 MG tablet TAKE 1 TABLET BY MOUTH 2 TIMES DAILY BEFORE A MEAL 180 tablet 2  . PROAIR HFA 108 (90 Base) MCG/ACT inhaler Inhale 2 puffs into the lungs every 4 (four) hours as needed for shortness of breath.  3  . rosuvastatin (CRESTOR) 40 MG tablet TAKE 1 TABLET BY MOUTH EVERY DAY AT 6 P.M. 90 tablet 0   No current facility-administered medications for this visit.     Past Medical History:  Diagnosis Date  . Anginal pain ([place])   . Arthritis    " IN MY NECK & SHOULDERS "  . CAD (coronary artery disease)    DES to mid circumflex October 2016  . Diverticulosis   . Dyspnea     AT TIMES"  . GERD (gastroesophageal reflux disease)   . Hypercholesterolemia   . Hypertension   . NSTEMI (non-ST elevated myocardial infarction) [facility])    October 2016    Past Surgical History:  Procedure Laterality Date  . CARDIAC CATHETERIZATION N/A 07/13/2015   Procedure: Left Heart Cath and Coronary Angiography;  Surgeon: Annita Brod  Angelena Form, MD;  Location: [place] [facility];  Service: Cardiovascular;  Laterality: N/A;  . CARDIAC CATHETERIZATION N/A 07/13/2015   PCI + DES to the mid circ. LVEF was normal at 65%  . COLONOSCOPY N/A 12/31/2015   Dr.Rourk- diverticulosis,21mm polyp in the splenic flexure, 32mm polyp in the splenic flexure, 71mm polyp in the sigmoid colon bx= traditional serrated adenoma  . ESOPHAGOGASTRODUODENOSCOPY N/A 12/31/2015   Dr.Rourk- esophagitis with no bleeding, diffuse moderately erythematous mucosa without bleeding was found in the entire examined stomach. stomach bx= slight chronic inflammation. esophagus bx= benign gastresophageal junction mucosa  . LEFT  HEART CATH AND CORONARY ANGIOGRAPHY N/A 03/20/2017   Procedure: Left Heart Cath and Coronary Angiography;  Surgeon: Martinique, Peter M, MD;  Location: [place] [facility];  Service: Cardiovascular;  Laterality: N/A;  . Venia Minks DILATION N/A 12/31/2015   Procedure: Venia Minks DILATION;  Surgeon: [name], MD;  [facility];  Service: Endoscopy;  Laterality: N/A;    Social History   Socioeconomic History  . Marital status: Married    Spouse name: Not on file  . Number of children: Not on file  . Years of education: Not on file  . Highest education level: Not on file  Occupational History  . 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 Financial resource strain: Not on file  . Food insecurity    Worry: Not on file    Inability: Not on file  . Transportation needs    Medical: Not on file    Non-medical: Not on file  Tobacco Use  . Smoking status: Former Smoker    Packs/day: 0.75    Years: 35.00    Pack years: 26.25    Types: Cigarettes    Start date: 08/08/1976    Quit date: 02/21/2018    Years since quitting: 1.2  . Smokeless tobacco: Never Used  Substance and Sexual Activity  . Alcohol use: No    Alcohol/week: 0.0 standard drinks  . Drug use: No  . Sexual activity: Not on file  Lifestyle  . Physical activity    Days per week: Not on file    Minutes per session: Not on file  . Stress: Not on file  Relationships  . Social Herbalist on phone: Not on file    Gets together: Not on file    Attends religious service: Not on file    Active member of club or organization: Not on file    Attends meetings of clubs or organizations: Not on file    Relationship status: Not on file  . Intimate partner violence    Fear of current or ex partner: Not on file    Emotionally abused: Not on file    Physically abused: Not on file    Forced sexual activity: Not on file  Other Topics Concern  . Not on file   Social History Narrative  . Not on file     Vitals:   06/10/19 1432  BP: (!) 139/92  Pulse: 77  Temp: (!) 97.5 F (36.4 C)  SpO2: 94%  Weight: 203 lb (92.1 kg)  Height: 5\' 6"  (1.676 m)    Wt Readings from Last 3 Encounters:  06/10/19 203 lb (92.1 kg)  01/13/19 155 lb (70.3 kg)  09/28/18 180 lb (81.6 kg)     PHYSICAL EXAM General: NAD HEENT: Normal. Neck: No JVD, no thyromegaly. Lungs:  Clear to auscultation bilaterally with normal respiratory effort. CV: Regular rate and rhythm, normal S1/S2, no S3/S4, no murmur. No pretibial or periankle edema.  No carotid bruit.   Abdomen: Soft, nontender, no distention.  Neurologic: Alert and oriented.  Psych: Normal affect. Skin: Normal. Musculoskeletal: No gross deformities.    ECG: Reviewed above under Subjective   Labs: Lab Results  Component Value Date/Time   K 3.9 01/13/2019 09:25 PM   BUN 21 (H) 01/13/2019 09:25 PM   CREATININE 0.87 01/13/2019 09:25 PM   CREATININE 0.89 02/25/2016 03:29 PM   ALT 29 01/13/2019 09:25 PM   TSH 0.875 12/19/2017 04:53 PM   HGB 14.0 01/13/2019 09:25 PM     Lipids: Lab Results  Component Value Date/Time   LDLCALC 52 12/20/2017 05:10 AM   CHOL 111 12/20/2017 05:10 AM   TRIG 95 12/20/2017 05:10 AM   HDL 40 (L) 12/20/2017 05:10 AM       ASSESSMENT AND PLAN: 1. Coronary artery disease: He has a left circumflex stent and is currently taking aspirin, Toprol-XL50mg daily, and Crestor 40 mg. He is no longer on Imdur. Symptoms are stable.  I will provide a prescription for sublingual nitroglycerin.  2. Hypertension: Controlled on present therapy.    No changes to therapy.  3. Hyperlipidemia: Continue rosuvastatin 40 mg.  I will check lipids.    Disposition: Follow up 1 year   Kate Sable, M.D., F.A.C.C.

## 2019-06-10 NOTE — Patient Instructions (Signed)
Medication Instructions: Your physician recommends that you continue on your current medications as directed. Please refer to the Current Medication list given to you today.   Labwork: Fasting lipids  Procedures/Testing: None  Follow-Up: 1 year with Dr.Koneswaran  Any Additional Special Instructions Will Be Listed Below (If Applicable).     If you need a refill on your cardiac medications before your next appointment, please call your pharmacy.     Thank you for choosing [facility] !

## 2019-06-10 NOTE — Addendum Note (Signed)
Addended by: Barbarann Ehlers A on: 06/10/2019 02:55 PM   Modules accepted: Orders

## 2019-06-14 ENCOUNTER — Other Ambulatory Visit (HOSPITAL_COMMUNITY)
Admission: RE | Admit: 2019-06-14 | Discharge: 2019-06-14 | Disposition: A | Discharge status: Home / Self Care | Payer: Commercial Managed Care - PPO | Source: Ambulatory Visit | Attending: Cardiovascular Disease | Admitting: Cardiovascular Disease

## 2019-06-14 ENCOUNTER — Other Ambulatory Visit: Payer: Self-pay

## 2019-06-14 DIAGNOSIS — E785 Hyperlipidemia, unspecified: Secondary | ICD-10-CM | POA: Insufficient documentation

## 2019-06-14 LAB — LIPID PANEL
Cholesterol: 162 mg/dL (ref 0–200)
HDL: 48 mg/dL (ref >40)
LDL Cholesterol: 87 mg/dL (ref 0–99)
Total CHOL/HDL Ratio: 3.4 RATIO
Triglycerides: 136 mg/dL (ref <150)
VLDL: 27 mg/dL (ref 0–40)

## 2019-06-24 ENCOUNTER — Telehealth: Payer: Self-pay

## 2019-06-24 DIAGNOSIS — E782 Mixed hyperlipidemia: Secondary | ICD-10-CM

## 2019-06-24 MED ORDER — EZETIMIBE 10 MG PO TABS: 10.0000 mg | ORAL_TABLET | Freq: Every day | ORAL | 3 refills | Status: DC

## 2019-06-24 NOTE — Telephone Encounter (Signed)
I poke with wife, will mail lab slip,e-scribed med

## 2019-06-24 NOTE — Telephone Encounter (Signed)
-----   Message from Laurine Blazer, LPN sent at [id number]  1:22 PM EDT -----  ----- Message ----- From: Herminio Commons, MD Sent: 06/18/2019   9:49 AM EDT To: Laurine Blazer, LPN  LDL not at goal.  If he is consistently taking rosuvastatin 40 mg, I would add Zetia 10 mg daily.  Repeat lipids in 6 months.

## 2019-07-18 ENCOUNTER — Other Ambulatory Visit: Payer: Self-pay

## 2019-07-18 DIAGNOSIS — Z20822 Contact with and (suspected) exposure to covid-19: Secondary | ICD-10-CM

## 2019-07-19 LAB — NOVEL CORONAVIRUS, NAA: SARS-CoV-2, NAA: NOT DETECTED

## 2019-08-16 ENCOUNTER — Other Ambulatory Visit: Payer: Self-pay | Admitting: Cardiovascular Disease

## 2019-10-30 ENCOUNTER — Encounter (HOSPITAL_COMMUNITY): Payer: Self-pay | Admitting: Emergency Medicine

## 2019-10-30 ENCOUNTER — Emergency Department (HOSPITAL_COMMUNITY): Payer: HRSA Program

## 2019-10-30 ENCOUNTER — Emergency Department (HOSPITAL_COMMUNITY)
Admission: EM | Admit: 2019-10-30 | Discharge: 2019-10-30 | Disposition: A | Discharge status: Home / Self Care | Payer: HRSA Program | Attending: Emergency Medicine | Admitting: Emergency Medicine

## 2019-10-30 ENCOUNTER — Other Ambulatory Visit: Payer: Self-pay

## 2019-10-30 DIAGNOSIS — R0602 Shortness of breath: Secondary | ICD-10-CM | POA: Insufficient documentation

## 2019-10-30 DIAGNOSIS — I10 Essential (primary) hypertension: Secondary | ICD-10-CM | POA: Insufficient documentation

## 2019-10-30 DIAGNOSIS — I2510 Atherosclerotic heart disease of native coronary artery without angina pectoris: Secondary | ICD-10-CM | POA: Diagnosis not present

## 2019-10-30 DIAGNOSIS — Z7982 Long term (current) use of aspirin: Secondary | ICD-10-CM | POA: Diagnosis not present

## 2019-10-30 DIAGNOSIS — Z79899 Other long term (current) drug therapy: Secondary | ICD-10-CM | POA: Insufficient documentation

## 2019-10-30 DIAGNOSIS — Z87891 Personal history of nicotine dependence: Secondary | ICD-10-CM | POA: Diagnosis not present

## 2019-10-30 DIAGNOSIS — R519 Headache, unspecified: Secondary | ICD-10-CM | POA: Diagnosis not present

## 2019-10-30 DIAGNOSIS — Z20822 Contact with and (suspected) exposure to covid-19: Secondary | ICD-10-CM | POA: Diagnosis not present

## 2019-10-30 LAB — CBC WITH DIFFERENTIAL/PLATELET
Abs Immature Granulocytes: 0.03 10*3/uL (ref 0.00–0.07)
Basophils Absolute: 0.1 10*3/uL (ref 0.0–0.1)
Basophils Relative: 1 %
Eosinophils Absolute: 0.1 10*3/uL (ref 0.0–0.5)
Eosinophils Relative: 2 %
HCT: 48 % (ref 39.0–52.0)
Hemoglobin: 15.6 g/dL (ref 13.0–17.0)
Immature Granulocytes: 1 %
Lymphocytes Relative: 34 %
Lymphs Abs: 1.8 10*3/uL (ref 0.7–4.0)
MCH: 29.9 pg (ref 26.0–34.0)
MCHC: 32.5 g/dL (ref 30.0–36.0)
MCV: 92.1 fL (ref 80.0–100.0)
Monocytes Absolute: 0.3 10*3/uL (ref 0.1–1.0)
Monocytes Relative: 6 %
Neutro Abs: 2.9 10*3/uL (ref 1.7–7.7)
Neutrophils Relative %: 56 %
Platelets: 234 10*3/uL (ref 150–400)
RBC: 5.21 MIL/uL (ref 4.22–5.81)
RDW: 11.9 % (ref 11.5–15.5)
WBC: 5.2 10*3/uL (ref 4.0–10.5)
nRBC: 0 % (ref 0.0–0.2)

## 2019-10-30 LAB — BASIC METABOLIC PANEL
Anion gap: 8 (ref 5–15)
BUN: 19 mg/dL (ref 6–20)
CO2: 27 mmol/L (ref 22–32)
Calcium: 9.2 mg/dL (ref 8.9–10.3)
Chloride: 104 mmol/L (ref 98–111)
Creatinine, Ser: 0.73 mg/dL (ref 0.61–1.24)
GFR calc Af Amer: >60 mL/min (ref >60)
GFR calc non Af Amer: >60 mL/min (ref >60)
Glucose, Bld: 86 mg/dL (ref 70–99)
Potassium: 3.9 mmol/L (ref 3.5–5.1)
Sodium: 139 mmol/L (ref 135–145)

## 2019-10-30 LAB — TROPONIN I (HIGH SENSITIVITY): Troponin I (High Sensitivity): <2 ng/L (ref <18)

## 2019-10-30 LAB — POC SARS CORONAVIRUS 2 AG -  ED: SARS Coronavirus 2 Ag: NEGATIVE

## 2019-10-30 LAB — BRAIN NATRIURETIC PEPTIDE: B Natriuretic Peptide: 37.1 pg/mL (ref 0.0–100.0)

## 2019-10-30 NOTE — Discharge Instructions (Addendum)
You were seen in the emergency department for evaluation of a headache and shortness of breath.  You had blood work EKG and chest x-ray that did not show any serious findings.  Your point-of-care Covid test was negative and your send out Covid test is pending at time of discharge.  This should result in the next day or 2.  He should isolate until your Covid testing is back and if it is positive you should isolate up to 10 days.  Please return to emergency department if any acute worsening of your symptoms.

## 2019-10-30 NOTE — ED Triage Notes (Signed)
Pt c/o headache and sob with exertion for couple days. Wants a covid test

## 2019-10-30 NOTE — ED Provider Notes (Signed)
[facility] DEPT Provider Note   CSN: [id number] Arrival date & time: 10/30/19  1245     History Chief Complaint  Patient presents with  . Headache  . Shortness of Breath    Anthony Hensley is a 59 y.o. male.  He has a history of cardiac disease.  He is complaining of a left-sided headache and dyspnea on exertion for a few days.  Ibuprofen without any improvement.  No cough no fever no nausea vomiting diarrhea.  His wife is also sick and he wants to get a Covid test.  The history is provided by the patient.  Shortness of Breath Severity:  Moderate Onset quality:  Gradual Duration:  2 days Timing:  Intermittent Progression:  Unchanged Chronicity:  New Relieved by:  None tried Worsened by:  Activity Ineffective treatments:  None tried Associated symptoms: headaches   Associated symptoms: no abdominal pain, no chest pain, no cough, no fever, no neck pain, no rash, no sore throat, no sputum production and no vomiting        Past Medical History:  Diagnosis Date  . Anginal pain ([place])   . Arthritis    " IN MY NECK & SHOULDERS "  . CAD (coronary artery disease)    DES to mid circumflex October 2016  . Diverticulosis   . Dyspnea     AT TIMES"  . GERD (gastroesophageal reflux disease)   . Hypercholesterolemia   . Hypertension   . NSTEMI (non-ST elevated myocardial infarction) [facility])    October 2016    Patient Active Problem List   Diagnosis Date Noted  . Coronary artery disease involving native heart with angina pectoris ([place]) 03/20/2017  . CAD (coronary artery disease) 03/20/2017  . Unstable angina ([place])   . Coronary artery disease involving native coronary artery of native heart with angina pectoris ([place]) 03/17/2017  . 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 Precordial chest pain   . Diarrhea 09/26/2016  . Enteritis 09/26/2016  . GERD (gastroesophageal reflux disease)  06/08/2016  . Constipation 06/08/2016  . Mucosal abnormality of stomach   . Mucosal abnormality of esophagus   . History of colonic polyps   . Diverticulosis of colon without hemorrhage   . Dysphagia 11/27/2015  . Rectal bleeding 11/27/2015  . Abdominal pain 11/27/2015  . Tobacco abuse 07/27/2015  . Chest pain at rest   . NSTEMI (non-ST elevated myocardial infarction) ([place])   . ACS (acute coronary syndrome) ([place]) 07/12/2015  . Hyperlipidemia 07/12/2015  . Chest pain 07/11/2015  . Benign essential HTN 07/11/2015  . LUMBAR SPRAIN AND STRAIN 06/08/2009    Past Surgical History:  Procedure Laterality Date  . CARDIAC CATHETERIZATION N/A 07/13/2015   Procedure: Left Heart Cath and Coronary Angiography;  Surgeon: Burnell Blanks, MD;  Location: [facility] CV LAB;  Service: Cardiovascular;  Laterality: N/A;  . CARDIAC CATHETERIZATION N/A 07/13/2015   PCI + DES to the mid circ. LVEF was normal at 65%  . COLONOSCOPY N/A 12/31/2015   Dr.Rourk- diverticulosis,32mm polyp in the splenic flexure, 66mm polyp in the splenic flexure, 12mm polyp in the sigmoid colon bx= traditional serrated adenoma  . ESOPHAGOGASTRODUODENOSCOPY N/A 12/31/2015   Dr.Rourk- esophagitis with no bleeding, diffuse moderately erythematous mucosa without bleeding was found in the entire examined stomach. stomach bx= slight chronic inflammation. esophagus bx= benign gastresophageal junction mucosa  . LEFT HEART CATH AND CORONARY ANGIOGRAPHY N/A 03/20/2017   Procedure: Left Heart Cath  and Coronary Angiography;  Surgeon: Martinique, Peter M, MD;  Location: [facility];  Service: Cardiovascular;  Laterality: N/A;  . Venia Minks DILATION N/A 12/31/2015   Procedure: Venia Minks DILATION;  Surgeon: [name], MD;  [facility];  Service: Endoscopy;  Laterality: N/A;       Family History  Problem Relation Age of Onset  . Heart attack Father 96       Deceased  . Colon cancer Neg Hx     Social History   Tobacco  Use  . Smoking status: Former Smoker    Packs/day: 0.75    Years: 35.00    Pack years: 26.25    Types: Cigarettes    Start date: 08/08/1976    Quit date: 02/21/2018    Years since quitting: 1.6  . Smokeless tobacco: Never Used  Substance Use Topics  . Alcohol use: No    Alcohol/week: 0.0 standard drinks  . Drug use: No    Home Medications Prior to Admission medications   Medication Sig Start Date End Date Taking? Authorizing Provider  albuterol (PROVENTIL) (2.5 MG/3ML) 0.083% nebulizer solution Take 3 mLs by nebulization daily as needed for shortness of breath. 08/04/16   [provider]  aspirin 81 MG EC tablet Take 1 tablet (81 mg total) by mouth daily. 07/14/15   Lyda Jester M, PA-C  dexamethasone (DECADRON) 4 MG tablet Take 1 tablet (4 mg total) by mouth 2 (two) times daily with a meal. 01/13/19   Lily Kocher, PA-C  dicyclomine (BENTYL) 10 MG capsule Take 1 capsule (10 mg total) by mouth 4 (four) times daily as needed for spasms. 06/29/17   [name], NP  ezetimibe (ZETIA) 10 MG tablet Take 1 tablet (10 mg total) by mouth daily. 06/24/19 09/22/19  Herminio Commons, MD  Glycerin-Hypromellose-PEG 400 (VISINE TEARS OP) Apply 1 drop to eye daily as needed (dry eyes).     [provider]  isosorbide mononitrate (IMDUR) 60 MG 24 hr tablet TAKE ONE AND A HALF TABLETS BY MOUTH EVERY DAY 02/20/19   Herminio Commons, MD  lisinopril (ZESTRIL) 2.5 MG tablet TAKE 1 TABLET BY MOUTH EVERY DAY 08/16/19   Herminio Commons, MD  meclizine (ANTIVERT) 12.5 MG tablet Take 1 tablet (12.5 mg total) by mouth 3 (three) times daily as needed for dizziness. 01/13/19   Lily Kocher, PA-C  metoprolol succinate (TOPROL-XL) 25 MG 24 hr tablet TAKE 1 TABLET BY MOUTH EVERY DAY 03/21/19   Herminio Commons, MD  metoprolol succinate (TOPROL-XL) 50 MG 24 hr tablet Take 50 mg by mouth daily. Take with or immediately following a meal.    [provider]  nitroGLYCERIN  (NITROSTAT) 0.4 MG SL tablet Place 1 tablet (0.4 mg total) under the tongue every 5 (five) minutes as needed for chest pain. 06/10/19 09/08/19  Herminio Commons, MD  pantoprazole (PROTONIX) 40 MG tablet TAKE 1 TABLET BY MOUTH 2 TIMES DAILY BEFORE A MEAL 12/26/18   [name], NP  PROAIR HFA 108 [phone] Base) MCG/ACT inhaler Inhale 2 puffs into the lungs every 4 (four) hours as needed for shortness of breath. 08/01/16   [provider]  rosuvastatin (CRESTOR) 40 MG tablet TAKE 1 TABLET BY MOUTH EVERY DAY AT 6 P.M. 08/16/19   Herminio Commons, MD    Allergies    Patient has no known allergies.  Review of Systems   Review of Systems  Constitutional: Negative for fever.  HENT: Negative for  sore throat.   Eyes: Negative for visual disturbance.  Respiratory: Positive for shortness of breath. Negative for cough and sputum production.   Cardiovascular: Negative for chest pain.  Gastrointestinal: Negative for abdominal pain and vomiting.  Genitourinary: Negative for dysuria.  Musculoskeletal: Negative for neck pain.  Skin: Negative for rash.  Neurological: Positive for headaches.    Physical Exam Updated Vital Signs BP (!) 143/102   Pulse 65   Temp 97.6 F (36.4 C)   Resp 19   SpO2 97%   Physical Exam Vitals and nursing note reviewed.  Constitutional:      Appearance: He is well-developed.  HENT:     Head: Normocephalic and atraumatic.  Eyes:     Conjunctiva/sclera: Conjunctivae normal.  Cardiovascular:     Rate and Rhythm: Normal rate and regular rhythm.     Heart sounds: No murmur.  Pulmonary:     Effort: Pulmonary effort is normal. No respiratory distress.     Breath sounds: Normal breath sounds.  Abdominal:     Palpations: Abdomen is soft.     Tenderness: There is no abdominal tenderness.  Musculoskeletal:        General: Normal range of motion.     Cervical back: Neck supple.     Right lower leg: No tenderness. No edema.     Left lower leg: No tenderness.  No edema.  Skin:    General: Skin is warm and dry.     Capillary Refill: Capillary refill takes less than 2 seconds.  Neurological:     General: No focal deficit present.     Mental Status: He is alert.     GCS: GCS eye subscore is 4. GCS verbal subscore is 5. GCS motor subscore is 6.     Sensory: No sensory deficit.     Motor: No weakness.     ED Results / Procedures / Treatments   Labs (all labs ordered are listed, but only abnormal results are displayed) Labs Reviewed  NOVEL CORONAVIRUS, NAA (HOSP ORDER, SEND-OUT TO REF LAB; TAT 18-24 HRS)  BASIC METABOLIC PANEL  CBC WITH DIFFERENTIAL/PLATELET  BRAIN NATRIURETIC PEPTIDE  POC SARS CORONAVIRUS 2 AG -  ED  TROPONIN I (HIGH SENSITIVITY)  TROPONIN I (HIGH SENSITIVITY)    EKG EKG Interpretation  Date/Time:  Wednesday October 30 2019 12:57:30 EST Ventricular Rate:  73 PR Interval:  162 QRS Duration: 100 QT Interval:  390 QTC Calculation: 429 R Axis:   73 Text Interpretation: Normal sinus rhythm Incomplete right bundle branch block No STEMI Confirmed by Octaviano Glow [phone]) on 10/30/2019 1:09:39 PM   Radiology DG Chest 2 View  Result Date: 10/30/2019 CLINICAL DATA:  Shortness of breath and headache EXAM: CHEST - 2 VIEW COMPARISON:  January 13, 2019 FINDINGS: Lungs are clear. The heart size and pulmonary vascularity are normal. There is fat prominence along the right and left heart borders. No evident adenopathy. No bone lesions. No pneumothorax. There is an apparent stent in the left circumflex coronary artery. IMPRESSION: Lungs clear. Stable cardiac silhouette. No adenopathy. Coronary artery stent noted. Electronically Signed   By: Lowella Grip III M.D.   On: 10/30/2019 13:11    Procedures Procedures (including critical care time)  Medications Ordered in ED Medications - No data to display  ED Course  I have reviewed the triage vital signs and the nursing notes.  Pertinent labs & imaging results that were  available during my care of the patient were reviewed by me and considered  in my medical decision making (see chart for details).  Clinical Course as of Oct 29 1709  Wed Oct 30, 2019  1339 ECG is normal sinus rhythm no acute ST-T changes.  Incomplete right bundle branch block.   [MB]  1342 Differential diagnosis includes Covid, tension headache, bronchitis, pneumonia, metabolic derangement, ACS.   [MB]  [id number] Chest x-ray interpreted by me as no gross infiltrates no pneumothorax.  EKG showing incomplete right bundle branch block sinus without any acute ST-T's.   [MB]    Clinical Course User Index [MB] Hayden Rasmussen, MD   MDM Rules/Calculators/A&P                     Anthony Hensley was evaluated in Emergency Department on 10/30/2019 for the symptoms described in the history of present illness. He was evaluated in the context of the global COVID-19 pandemic, which necessitated consideration that the patient might be at risk for infection with the SARS-CoV-2 virus that causes COVID-19. Institutional protocols and algorithms that pertain to the evaluation of patients at risk for COVID-19 are in a state of rapid change based on information released by regulatory bodies including the CDC and federal and state organizations. These policies and algorithms were followed during the patient's care in the ED.   Final Clinical Impression(s) / ED Diagnoses Final diagnoses:  Generalized headache  Shortness of breath    Rx / DC Orders ED Discharge Orders    None       Hayden Rasmussen, MD 10/30/19 1712

## 2019-10-30 NOTE — ED Provider Notes (Signed)
  Physical Exam  BP (!) 155/106   Pulse 69   Temp 97.6 F (36.4 C)   Resp 11   SpO2 96%   Physical Exam  ED Course/Procedures   Clinical Course as of Oct 30 1547  Wed Oct 30, 2019  1339 ECG is normal sinus rhythm no acute ST-T changes.  Incomplete right bundle branch block.   [MB]  1342 Differential diagnosis includes Covid, tension headache, bronchitis, pneumonia, metabolic derangement, ACS.   [MB]  [id number] Chest x-ray interpreted by me as no gross infiltrates no pneumothorax.  EKG showing incomplete right bundle branch block sinus without any acute ST-T's.   [MB]    Clinical Course User Index [MB] Hayden Rasmussen, MD    Procedures  MDM  Received patient in signout. Blood work and x-ray reassuring. Troponin negative. EKG reassuring. Discharge home. Second Covid test pending.      Davonna Belling, MD 10/30/19 1550

## 2019-10-31 LAB — NOVEL CORONAVIRUS, NAA (HOSP ORDER, SEND-OUT TO REF LAB; TAT 18-24 HRS): SARS-CoV-2, NAA: NOT DETECTED

## 2019-11-20 ENCOUNTER — Emergency Department (HOSPITAL_COMMUNITY)
Admission: EM | Admit: 2019-11-20 | Discharge: 2019-11-20 | Disposition: A | Discharge status: Home / Self Care | Payer: PRIVATE HEALTH INSURANCE | Attending: Emergency Medicine | Admitting: Emergency Medicine

## 2019-11-20 ENCOUNTER — Encounter (HOSPITAL_COMMUNITY): Payer: Self-pay | Admitting: *Deleted

## 2019-11-20 ENCOUNTER — Other Ambulatory Visit: Payer: Self-pay

## 2019-11-20 DIAGNOSIS — Z79899 Other long term (current) drug therapy: Secondary | ICD-10-CM | POA: Insufficient documentation

## 2019-11-20 DIAGNOSIS — Z87891 Personal history of nicotine dependence: Secondary | ICD-10-CM | POA: Insufficient documentation

## 2019-11-20 DIAGNOSIS — I25118 Atherosclerotic heart disease of native coronary artery with other forms of angina pectoris: Secondary | ICD-10-CM | POA: Diagnosis not present

## 2019-11-20 DIAGNOSIS — Z7982 Long term (current) use of aspirin: Secondary | ICD-10-CM | POA: Diagnosis not present

## 2019-11-20 DIAGNOSIS — K0889 Other specified disorders of teeth and supporting structures: Secondary | ICD-10-CM | POA: Diagnosis not present

## 2019-11-20 DIAGNOSIS — I10 Essential (primary) hypertension: Secondary | ICD-10-CM | POA: Insufficient documentation

## 2019-11-20 DIAGNOSIS — Z955 Presence of coronary angioplasty implant and graft: Secondary | ICD-10-CM | POA: Diagnosis not present

## 2019-11-20 DIAGNOSIS — I252 Old myocardial infarction: Secondary | ICD-10-CM | POA: Insufficient documentation

## 2019-11-20 MED ORDER — TRAMADOL HCL 50 MG PO TABS: 50.0000 mg | ORAL_TABLET | Freq: Four times a day (QID) | ORAL | 0 refills | Status: DC | PRN

## 2019-11-20 MED ORDER — PENICILLIN V POTASSIUM 500 MG PO TABS: 500.0000 mg | ORAL_TABLET | Freq: Four times a day (QID) | ORAL | 0 refills | Status: DC

## 2019-11-20 MED ORDER — IBUPROFEN 800 MG PO TABS: 800.0000 mg | ORAL_TABLET | Freq: Three times a day (TID) | ORAL | 0 refills | Status: DC | PRN

## 2019-11-20 NOTE — Discharge Instructions (Signed)
Return here as needed.  Follow-up with your dentist as soon as possible.

## 2019-11-20 NOTE — ED Provider Notes (Signed)
[facility] Provider Note   CSN: [id number] Arrival date & time: 11/20/19  1337     History Chief Complaint  Patient presents with  . Dental Pain    Anthony Hensley is a 59 y.o. male.  HPI Patient presents to the emergency department with a left upper canine that is painful.  The patient states he has had issues with that tooth in the past.  The patient states that he called his dentist but they will be unable to see him for about a week.  Patient states that he did not take any medications prior to arrival for her symptoms.  Patient denies fever, nausea, vomiting, throat swelling, mouth swelling, tongue swelling, difficulty breathing, or syncope.    Past Medical History:  Diagnosis Date  . Anginal pain ([place])   . Arthritis    " IN MY NECK & SHOULDERS "  . CAD (coronary artery disease)    DES to mid circumflex October 2016  . Diverticulosis   . Dyspnea     AT TIMES"  . GERD (gastroesophageal reflux disease)   . Hypercholesterolemia   . Hypertension   . NSTEMI (non-ST elevated myocardial infarction) [facility])    October 2016    Patient Active Problem List   Diagnosis Date Noted  . Coronary artery disease involving native heart with angina pectoris ([place]) 03/20/2017  . CAD (coronary artery disease) 03/20/2017  . Unstable angina ([place])   . Coronary artery disease involving native coronary artery of native heart with angina pectoris ([place]) 03/17/2017  . 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 Precordial chest pain   . Diarrhea 09/26/2016  . Enteritis 09/26/2016  . GERD (gastroesophageal reflux disease) 06/08/2016  . Constipation 06/08/2016  . Mucosal abnormality of stomach   . Mucosal abnormality of esophagus   . History of colonic polyps   . Diverticulosis of colon without hemorrhage   . Dysphagia 11/27/2015  . Rectal bleeding 11/27/2015  . Abdominal pain 11/27/2015  . Tobacco abuse  07/27/2015  . Chest pain at rest   . NSTEMI (non-ST elevated myocardial infarction) ([place])   . ACS (acute coronary syndrome) ([place]) 07/12/2015  . Hyperlipidemia 07/12/2015  . Chest pain 07/11/2015  . Benign essential HTN 07/11/2015  . LUMBAR SPRAIN AND STRAIN 06/08/2009    Past Surgical History:  Procedure Laterality Date  . CARDIAC CATHETERIZATION N/A 07/13/2015   Procedure: Left Heart Cath and Coronary Angiography;  Surgeon: Burnell Blanks, MD;  Location: [facility] CV LAB;  Service: Cardiovascular;  Laterality: N/A;  . CARDIAC CATHETERIZATION N/A 07/13/2015   PCI + DES to the mid circ. LVEF was normal at 65%  . COLONOSCOPY N/A 12/31/2015   Dr.Rourk- diverticulosis,66mm polyp in the splenic flexure, 21mm polyp in the splenic flexure, 71mm polyp in the sigmoid colon bx= traditional serrated adenoma  . ESOPHAGOGASTRODUODENOSCOPY N/A 12/31/2015   Dr.Rourk- esophagitis with no bleeding, diffuse moderately erythematous mucosa without bleeding was found in the entire examined stomach. stomach bx= slight chronic inflammation. esophagus bx= benign gastresophageal junction mucosa  . LEFT HEART CATH AND CORONARY ANGIOGRAPHY N/A 03/20/2017   Procedure: Left Heart Cath and Coronary Angiography;  Surgeon: Martinique, Peter M, MD;  Location: [facility];  Service: Cardiovascular;  Laterality: N/A;  . Venia Minks DILATION N/A 12/31/2015   Procedure: Venia Minks DILATION;  Surgeon: [name], MD;  [facility];  Service: Endoscopy;  Laterality: N/A;  Family History  Problem Relation Age of Onset  . Heart attack Father 78       Deceased  . Colon cancer Neg Hx     Social History   Tobacco Use  . Smoking status: Former Smoker    Packs/day: 0.75    Years: 35.00    Pack years: 26.25    Types: Cigarettes    Start date: 08/08/1976    Quit date: 02/21/2018    Years since quitting: 1.7  . Smokeless tobacco: Never Used  Substance Use Topics  . Alcohol use: No    Alcohol/week:  0.0 standard drinks  . Drug use: No    Home Medications Prior to Admission medications   Medication Sig Start Date End Date Taking? Authorizing Provider  albuterol (PROVENTIL) (2.5 MG/3ML) 0.083% nebulizer solution Take 3 mLs by nebulization daily as needed for shortness of breath. 08/04/16   [provider]  aspirin 81 MG EC tablet Take 1 tablet (81 mg total) by mouth daily. 07/14/15   Lyda Jester M, PA-C  dexamethasone (DECADRON) 4 MG tablet Take 1 tablet (4 mg total) by mouth 2 (two) times daily with a meal. 01/13/19   Lily Kocher, PA-C  dicyclomine (BENTYL) 10 MG capsule Take 1 capsule (10 mg total) by mouth 4 (four) times daily as needed for spasms. 06/29/17   [name], NP  ezetimibe (ZETIA) 10 MG tablet Take 1 tablet (10 mg total) by mouth daily. 06/24/19 10/30/19  Herminio Commons, MD  Glycerin-Hypromellose-PEG 400 (VISINE TEARS OP) Apply 1 drop to eye daily as needed (dry eyes).     [provider]  ibuprofen (ADVIL) 200 MG tablet Take 800 mg by mouth every 6 (six) hours as needed for headache.    [provider]  isosorbide mononitrate (IMDUR) 60 MG 24 hr tablet TAKE ONE AND A HALF TABLETS BY MOUTH EVERY DAY Patient taking differently: Take 90 mg by mouth daily.  02/20/19   Herminio Commons, MD  lisinopril (ZESTRIL) 2.5 MG tablet TAKE 1 TABLET BY MOUTH EVERY DAY Patient taking differently: Take 2.5 mg by mouth daily.  08/16/19   Herminio Commons, MD  meclizine (ANTIVERT) 12.5 MG tablet Take 1 tablet (12.5 mg total) by mouth 3 (three) times daily as needed for dizziness. 01/13/19   Lily Kocher, PA-C  metoprolol succinate (TOPROL-XL) 25 MG 24 hr tablet TAKE 1 TABLET BY MOUTH EVERY DAY Patient taking differently: Take 25 mg by mouth daily.  03/21/19   Herminio Commons, MD  nitroGLYCERIN (NITROSTAT) 0.4 MG SL tablet Place 1 tablet (0.4 mg total) under the tongue every 5 (five) minutes as needed for chest pain. 06/10/19 10/30/19  Herminio Commons, MD  pantoprazole (PROTONIX) 40 MG tablet TAKE 1 TABLET BY MOUTH 2 TIMES DAILY BEFORE A MEAL Patient taking differently: Take 40 mg by mouth 2 (two) times daily before a meal.  12/26/18   [name], NP  PROAIR HFA 108 [phone] Base) MCG/ACT inhaler Inhale 2 puffs into the lungs every 4 (four) hours as needed for shortness of breath. 08/01/16   [provider]  rosuvastatin (CRESTOR) 40 MG tablet TAKE 1 TABLET BY MOUTH EVERY DAY AT 6 P.M. Patient taking differently: Take 40 mg by mouth daily.  08/16/19   Herminio Commons, MD    Allergies    Patient has no known allergies.  Review of Systems   Review of Systems All other systems negative except as documented in the HPI. All  pertinent positives and negatives as reviewed in the HPI. Physical Exam Updated Vital Signs BP (!) 159/94 (BP Location: Right Arm)   Pulse 82   Temp 98.6 F (37 C) (Oral)   Resp 14   Ht 5\' 6"  (1.676 m)   Wt 93.4 kg   SpO2 97%   BMI 33.25 kg/m   Physical Exam Vitals and nursing note reviewed.  Constitutional:      General: He is not in acute distress.    Appearance: He is well-developed.  HENT:     Head: Normocephalic and atraumatic.     Mouth/Throat:     Dentition: Abnormal dentition. Dental tenderness and dental caries present.     Pharynx: Oropharynx is clear. Uvula midline. No uvula swelling.  Eyes:     Pupils: Pupils are equal, round, and reactive to light.  Pulmonary:     Effort: Pulmonary effort is normal.  Skin:    General: Skin is warm and dry.  Neurological:     Mental Status: He is alert and oriented to person, place, and time.     ED Results / Procedures / Treatments   Labs (all labs ordered are listed, but only abnormal results are displayed) Labs Reviewed - No data to display  EKG None  Radiology No results found.  Procedures Procedures (including critical care time)  Medications Ordered in ED Medications - No data to display  ED Course  I have  reviewed the triage vital signs and the nursing notes.  Pertinent labs & imaging results that were available during my care of the patient were reviewed by me and considered in my medical decision making (see chart for details).    MDM Rules/Calculators/A&P                      Patient will be treated for his dental pain and referred back to his dentist.  Advised patient to return here as needed.  There is no major signs of swelling or abscess at this point. Final Clinical Impression(s) / ED Diagnoses Final diagnoses:  None    Rx / DC Orders ED Discharge Orders    None       Dalia Heading, PA-C 11/20/19 1449    Maudie Flakes, MD 11/21/19 [phone]

## 2019-11-20 NOTE — ED Triage Notes (Signed)
Pt reports left upper toothache since last pm, denies fever. Denies injury to tooth. Took tylenol with some relief.

## 2019-12-02 ENCOUNTER — Other Ambulatory Visit: Payer: Self-pay

## 2019-12-02 ENCOUNTER — Emergency Department (HOSPITAL_COMMUNITY)
Admission: EM | Admit: 2019-12-02 | Discharge: 2019-12-02 | Disposition: A | Discharge status: Home / Self Care | Payer: PRIVATE HEALTH INSURANCE | Attending: Emergency Medicine | Admitting: Emergency Medicine

## 2019-12-02 ENCOUNTER — Encounter (HOSPITAL_COMMUNITY): Payer: Self-pay | Admitting: Emergency Medicine

## 2019-12-02 DIAGNOSIS — I10 Essential (primary) hypertension: Secondary | ICD-10-CM

## 2019-12-02 DIAGNOSIS — M7021 Olecranon bursitis, right elbow: Secondary | ICD-10-CM

## 2019-12-02 DIAGNOSIS — Z7982 Long term (current) use of aspirin: Secondary | ICD-10-CM | POA: Insufficient documentation

## 2019-12-02 DIAGNOSIS — Z79899 Other long term (current) drug therapy: Secondary | ICD-10-CM | POA: Insufficient documentation

## 2019-12-02 DIAGNOSIS — I252 Old myocardial infarction: Secondary | ICD-10-CM | POA: Insufficient documentation

## 2019-12-02 DIAGNOSIS — I2510 Atherosclerotic heart disease of native coronary artery without angina pectoris: Secondary | ICD-10-CM | POA: Insufficient documentation

## 2019-12-02 DIAGNOSIS — Y9389 Activity, other specified: Secondary | ICD-10-CM | POA: Insufficient documentation

## 2019-12-02 DIAGNOSIS — Z87891 Personal history of nicotine dependence: Secondary | ICD-10-CM | POA: Diagnosis not present

## 2019-12-02 DIAGNOSIS — M7989 Other specified soft tissue disorders: Secondary | ICD-10-CM | POA: Diagnosis present

## 2019-12-02 MED ORDER — PREDNISONE 10 MG PO TABS: ORAL_TABLET | ORAL | 0 refills | Status: DC

## 2019-12-02 NOTE — Discharge Instructions (Signed)
Take the entire course of the prednisone prescribed.  Wear the ace wrap to provide compression and avoid application of pressure to the site (no resting the elbow on arm chair, table, etc.).  Apply ice as  much as is comfortable for the next week.  Get rechecked for any worsened swelling, pain or if redness develops.  Your blood pressure is elevated here today and could be from the stress of being here.  However, I recommend having your blood pressure rechecked within the week.

## 2019-12-02 NOTE — ED Provider Notes (Signed)
[facility] EMERGENCY DEPARTMENT Provider Note   CSN: [id number] Arrival date & time: 12/02/19  [id number]     History Chief Complaint  Patient presents with  . Joint Swelling    Anthony Hensley is a 59 y.o. male with a history of CAD with nstemi in 2016, GERD, arthritis of the neck and shoulders and htn presenting with swelling of his right elbow which he woke with today.  He denies any injury to the site and denies prior episodes of similar symptoms.  Denies fevers, chills, pain or redness.  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  He has had no treatment prior to arrival.    Check of bp and elevated.  Pt has just taken his am meds prior to arrival. No cp, no sob or headache.     HPI     Past Medical History:  Diagnosis Date  . Anginal pain ([place])   . Arthritis    " IN MY NECK & SHOULDERS "  . CAD (coronary artery disease)    DES to mid circumflex October 2016  . Diverticulosis   . Dyspnea     AT TIMES"  . GERD (gastroesophageal reflux disease)   . Hypercholesterolemia   . Hypertension   . NSTEMI (non-ST elevated myocardial infarction) [facility])    October 2016    Patient Active Problem List   Diagnosis Date Noted  . Coronary artery disease involving native heart with angina pectoris ([place]) 03/20/2017  . CAD (coronary artery disease) 03/20/2017  . Unstable angina ([place])   . Coronary artery disease involving native coronary artery of native heart with angina pectoris ([place]) 03/17/2017  . 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 Precordial chest pain   . Diarrhea 09/26/2016  . Enteritis 09/26/2016  . GERD (gastroesophageal reflux disease) 06/08/2016  . Constipation 06/08/2016  . Mucosal abnormality of stomach   . Mucosal abnormality of esophagus   . History of colonic polyps   . Diverticulosis of colon without hemorrhage   . Dysphagia 11/27/2015  . Rectal bleeding 11/27/2015  . Abdominal pain  11/27/2015  . Tobacco abuse 07/27/2015  . Chest pain at rest   . NSTEMI (non-ST elevated myocardial infarction) ([place])   . ACS (acute coronary syndrome) ([place]) 07/12/2015  . Hyperlipidemia 07/12/2015  . Chest pain 07/11/2015  . Benign essential HTN 07/11/2015  . LUMBAR SPRAIN AND STRAIN 06/08/2009    Past Surgical History:  Procedure Laterality Date  . CARDIAC CATHETERIZATION N/A 07/13/2015   Procedure: Left Heart Cath and Coronary Angiography;  Surgeon: Burnell Blanks, MD;  Location: [place] [facility] LAB;  Service: Cardiovascular;  Laterality: N/A;  . CARDIAC CATHETERIZATION N/A 07/13/2015   PCI + DES to the mid circ. LVEF was normal at 65%  . COLONOSCOPY N/A 12/31/2015   Dr.Rourk- diverticulosis,84mm polyp in the splenic flexure, 63mm polyp in the splenic flexure, 70mm polyp in the sigmoid colon bx= traditional serrated adenoma  . ESOPHAGOGASTRODUODENOSCOPY N/A 12/31/2015   Dr.Rourk- esophagitis with no bleeding, diffuse moderately erythematous mucosa without bleeding was found in the entire examined stomach. stomach bx= slight chronic inflammation. esophagus bx= benign gastresophageal junction mucosa  . LEFT HEART CATH AND CORONARY ANGIOGRAPHY N/A 03/20/2017   Procedure: Left Heart Cath and Coronary Angiography;  Surgeon: Martinique, Peter M, MD;  Location: [place] [facility] LAB;  Service: Cardiovascular;  Laterality: N/A;  . MALONEY DILATION N/A 12/31/2015   Procedure: Venia Minks DILATION;  Surgeon: Cristopher Estimable  Rourk, MD;  Location[facility];  Service: Endoscopy;  Laterality: N/A;       Family History  Problem Relation Age of Onset  . Heart attack Father 61       Deceased  . Colon cancer Neg Hx     Social History   Tobacco Use  . Smoking status: Former Smoker    Packs/day: 0.75    Years: 35.00    Pack years: 26.25    Types: Cigarettes    Start date: 08/08/1976    Quit date: 02/21/2018    Years since quitting: 1.7  . Smokeless tobacco: Never Used  Substance Use Topics  . Alcohol  use: No    Alcohol/week: 0.0 standard drinks  . Drug use: No    Home Medications Prior to Admission medications   Medication Sig Start Date End Date Taking? Authorizing Provider  albuterol (PROVENTIL) (2.5 MG/3ML) 0.083% nebulizer solution Take 3 mLs by nebulization daily as needed for shortness of breath. 08/04/16   [provider]  aspirin 81 MG EC tablet Take 1 tablet (81 mg total) by mouth daily. 07/14/15   Lyda Jester M, PA-C  dexamethasone (DECADRON) 4 MG tablet Take 1 tablet (4 mg total) by mouth 2 (two) times daily with a meal. 01/13/19   Lily Kocher, PA-C  dicyclomine (BENTYL) 10 MG capsule Take 1 capsule (10 mg total) by mouth 4 (four) times daily as needed for spasms. 06/29/17   [name], NP  ezetimibe (ZETIA) 10 MG tablet Take 1 tablet (10 mg total) by mouth daily. 06/24/19 10/30/19  Herminio Commons, MD  Glycerin-Hypromellose-PEG 400 (VISINE TEARS OP) Apply 1 drop to eye daily as needed (dry eyes).     [provider]  ibuprofen (ADVIL) 800 MG tablet Take 1 tablet (800 mg total) by mouth every 8 (eight) hours as needed. 11/20/19   Lawyer, Harrell Gave, PA-C  ibuprofen (ADVIL) 800 MG tablet Take 1 tablet (800 mg total) by mouth every 8 (eight) hours as needed. 11/20/19   Lawyer, Harrell Gave, PA-C  isosorbide mononitrate (IMDUR) 60 MG 24 hr tablet TAKE ONE AND A HALF TABLETS BY MOUTH EVERY DAY Patient taking differently: Take 90 mg by mouth daily.  02/20/19   Herminio Commons, MD  lisinopril (ZESTRIL) 2.5 MG tablet TAKE 1 TABLET BY MOUTH EVERY DAY Patient taking differently: Take 2.5 mg by mouth daily.  08/16/19   Herminio Commons, MD  meclizine (ANTIVERT) 12.5 MG tablet Take 1 tablet (12.5 mg total) by mouth 3 (three) times daily as needed for dizziness. 01/13/19   Lily Kocher, PA-C  metoprolol succinate (TOPROL-XL) 25 MG 24 hr tablet TAKE 1 TABLET BY MOUTH EVERY DAY Patient taking differently: Take 25 mg by mouth daily.  03/21/19   Herminio Commons, MD  nitroGLYCERIN (NITROSTAT) 0.4 MG SL tablet Place 1 tablet (0.4 mg total) under the tongue every 5 (five) minutes as needed for chest pain. 06/10/19 10/30/19  Herminio Commons, MD  pantoprazole (PROTONIX) 40 MG tablet TAKE 1 TABLET BY MOUTH 2 TIMES DAILY BEFORE A MEAL Patient taking differently: Take 40 mg by mouth 2 (two) times daily before a meal.  12/26/18   [name], NP  penicillin v potassium (VEETID) 500 MG tablet Take 1 tablet (500 mg total) by mouth 4 (four) times daily. 11/20/19   Lawyer, Harrell Gave, PA-C  penicillin v potassium (VEETID) 500 MG tablet Take 1 tablet (500 mg total) by mouth 4 (four) times daily. 11/20/19  Lawyer, Harrell Gave, PA-C  predniSONE (DELTASONE) 10 MG tablet Take 6 tablets day one, 5 tablets day two, 4 tablets day three, 3 tablets day four, 2 tablets day five, then 1 tablet day six 12/02/19   Paden Kuras, Almyra Free, PA-C  PROAIR HFA 108 (90 Base) MCG/ACT inhaler Inhale 2 puffs into the lungs every 4 (four) hours as needed for shortness of breath. 08/01/16   [provider]  rosuvastatin (CRESTOR) 40 MG tablet TAKE 1 TABLET BY MOUTH EVERY DAY AT 6 P.M. Patient taking differently: Take 40 mg by mouth daily.  08/16/19   Herminio Commons, MD  traMADol (ULTRAM) 50 MG tablet Take 1 tablet (50 mg total) by mouth every 6 (six) hours as needed for severe pain. 11/20/19   Lawyer, Harrell Gave, PA-C  traMADol (ULTRAM) 50 MG tablet Take 1 tablet (50 mg total) by mouth every 6 (six) hours as needed for severe pain. 11/20/19   Dalia Heading, PA-C    Allergies    Patient has no known allergies.  Review of Systems   Review of Systems  Constitutional: Negative for fever.  Musculoskeletal: Positive for joint swelling. Negative for arthralgias and myalgias.  Neurological: Negative for weakness and numbness.    Physical Exam Updated Vital Signs BP (!) 157/108 (BP Location: Right Arm)   Pulse 78   Temp 97.6 F (36.4 C) (Oral)   Resp 16   Ht 5\' 6"   (1.676 m)   Wt 83 kg   SpO2 97%   BMI 29.53 kg/m   Physical Exam Constitutional:      Appearance: He is well-developed.  HENT:     Head: Atraumatic.  Cardiovascular:     Rate and Rhythm: Normal rate.     Pulses: Normal pulses.     Comments: Pulses equal bilaterally Pulmonary:     Effort: Pulmonary effort is normal.  Musculoskeletal:        General: Swelling present. No tenderness.     Right elbow: Swelling present.     Cervical back: Normal range of motion.     Comments: Soft edema of right olecranon bursa.  There is no erythema, no crepitus, skin is intact without injury.  Skin:    General: Skin is warm and dry.     Findings: No erythema or lesion.  Neurological:     Mental Status: He is alert and oriented to person, place, and time.     Sensory: No sensory deficit.     ED Results / Procedures / Treatments   Labs (all labs ordered are listed, but only abnormal results are displayed) Labs Reviewed - No data to display  EKG None  Radiology No results found.  Procedures Procedures (including critical care time)  Medications Ordered in ED Medications - No data to display  ED Course  I have reviewed the triage vital signs and the nursing notes.  Pertinent labs & imaging results that were available during my care of the patient were reviewed by me and considered in my medical decision making (see chart for details).    MDM Rules/Calculators/A&P                      Patient with a right olecranon bursitis without any swelling or history suggesting infection.  Discussed home treatment including ice, compression, minimizing direct pressure to the site.  He was placed on a prednisone taper to help with inflammation.  Referral to Dr. Aline Brochure as needed if symptoms do not improve spontaneously.  Discussed he  may at some point need to consider drainage of the bursa if this does not resolve on its own.  Also discussed his high blood pressure, he has no symptoms of endorgan  damage, specifically no headache, chest pain or shortness of breath.  Advised recheck of his blood pressure within the week. Final Clinical Impression(s) / ED Diagnoses Final diagnoses:  Olecranon bursitis of right elbow  Essential hypertension    Rx / DC Orders ED Discharge Orders         Ordered    predniSONE (DELTASONE) 10 MG tablet     12/02/19 [id number]           Evalee Jefferson, PA-C 12/02/19 [id number]    Ezequiel Essex, MD 12/02/19 [id number]

## 2019-12-02 NOTE — ED Triage Notes (Signed)
Pt states he woke up this morning with swelling to his right elbow with no injury.

## 2019-12-09 ENCOUNTER — Other Ambulatory Visit: Payer: Self-pay

## 2019-12-09 ENCOUNTER — Encounter: Payer: Self-pay | Admitting: Nurse Practitioner

## 2019-12-09 ENCOUNTER — Ambulatory Visit (INDEPENDENT_AMBULATORY_CARE_PROVIDER_SITE_OTHER): Payer: PRIVATE HEALTH INSURANCE | Admitting: Nurse Practitioner

## 2019-12-09 VITALS — BP 144/90 | HR 81 | Temp 98.0°F | Resp 18 | Ht 66.0 in | Wt 210.8 lb

## 2019-12-09 DIAGNOSIS — M25541 Pain in joints of right hand: Secondary | ICD-10-CM

## 2019-12-09 DIAGNOSIS — K219 Gastro-esophageal reflux disease without esophagitis: Secondary | ICD-10-CM

## 2019-12-09 DIAGNOSIS — I10 Essential (primary) hypertension: Secondary | ICD-10-CM

## 2019-12-09 DIAGNOSIS — Z7689 Persons encountering health services in other specified circumstances: Secondary | ICD-10-CM

## 2019-12-09 DIAGNOSIS — I25119 Atherosclerotic heart disease of native coronary artery with unspecified angina pectoris: Secondary | ICD-10-CM

## 2019-12-09 DIAGNOSIS — I252 Old myocardial infarction: Secondary | ICD-10-CM

## 2019-12-09 DIAGNOSIS — E785 Hyperlipidemia, unspecified: Secondary | ICD-10-CM

## 2019-12-09 DIAGNOSIS — M25542 Pain in joints of left hand: Secondary | ICD-10-CM

## 2019-12-09 DIAGNOSIS — Z125 Encounter for screening for malignant neoplasm of prostate: Secondary | ICD-10-CM

## 2019-12-09 DIAGNOSIS — M7021 Olecranon bursitis, right elbow: Secondary | ICD-10-CM

## 2019-12-09 NOTE — Progress Notes (Signed)
New Patient Office Visit  Subjective:  Patient ID: Anthony Hensley, male    DOB: [birth year]  Age: 59 y.o. MRN: [id number]  CC:  Chief Complaint  Patient presents with  . Establish Care    NP    HPI Anthony Hensley is a 59 y.o caucasian male presenting to the clinic to establish care. Pt is a good historian of health hx, cardiac, and management plan. He see cardiology regularly. No cp/ct, gu/gi sxs, sob, edema, or falls. He has no concerns other than arthritic discomfort in hands, most bothersome, he report just finishing steroid for right elbow (olecrondile bursitis) which is still mildly present. Pt denied workup for srthritis or knowing type, never had OB fluid retraction with lab.   Past Medical History:  Diagnosis Date  . Anginal pain ([place])   . Arthritis    " IN MY NECK & SHOULDERS "  . CAD (coronary artery disease)    DES to mid circumflex October 2016  . Diverticulosis   . Dyspnea     AT TIMES"  . GERD (gastroesophageal reflux disease)   . Hypercholesterolemia   . Hypertension   . NSTEMI (non-ST elevated myocardial infarction) [facility])    October 2016    Past Surgical History:  Procedure Laterality Date  . CARDIAC CATHETERIZATION N/A 07/13/2015   Procedure: Left Heart Cath and Coronary Angiography;  Surgeon: Burnell Blanks, MD;  Location: [place] [facility];  Service: Cardiovascular;  Laterality: N/A;  . CARDIAC CATHETERIZATION N/A 07/13/2015   PCI + DES to the mid circ. LVEF was normal at 65%  . COLONOSCOPY N/A 12/31/2015   Dr.Rourk- diverticulosis,47mm polyp in the splenic flexure, 63mm polyp in the splenic flexure, 61mm polyp in the sigmoid colon bx= traditional serrated adenoma  . ESOPHAGOGASTRODUODENOSCOPY N/A 12/31/2015   Dr.Rourk- esophagitis with no bleeding, diffuse moderately erythematous mucosa without bleeding was found in the entire examined stomach. stomach bx= slight chronic inflammation. esophagus bx= benign gastresophageal junction mucosa  . LEFT HEART CATH  AND CORONARY ANGIOGRAPHY N/A 03/20/2017   Procedure: Left Heart Cath and Coronary Angiography;  Surgeon: Martinique, Peter M, MD;  Location: [place] [facility];  Service: Cardiovascular;  Laterality: N/A;  . Venia Minks DILATION N/A 12/31/2015   Procedure: Venia Minks DILATION;  Surgeon: [name], MD;  [facility];  Service: Endoscopy;  Laterality: N/A;    Family History  Problem Relation Age of Onset  . Heart attack Father 17       Deceased  . Colon cancer Neg Hx     Social History   Socioeconomic History  . Marital status: Married    Spouse name: Not on file  . Number of children: Not on file  . Years of education: Not on file  . Highest education level: Not on file  Occupational History  . 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 Smoking status: Former Smoker    Packs/day: 0.75    Years: 35.00    Pack years: 26.25    Types: Cigarettes    Start date: 08/08/1976    Quit date: 02/21/2018    Years since quitting: 1.7  . Smokeless tobacco: Never Used  Substance and Sexual Activity  . Alcohol use: No    Alcohol/week: 0.0 standard drinks  . Drug use: No  . Sexual activity: Not on file  Other Topics Concern  . Not on file  Social History  Narrative  . Not on file   Social Determinants of Health   Financial Resource Strain:   . Difficulty of Paying Living Expenses: Not on file  Food Insecurity:   . Worried About Charity fundraiser in the Last Year: Not on file  . Ran Out of Food in the Last Year: Not on file  Transportation Needs:   . Lack of Transportation (Medical): Not on file  . Lack of Transportation (Non-Medical): Not on file  Physical Activity:   . Days of Exercise per Week: Not on file  . Minutes of Exercise per Session: Not on file  Stress:   . Feeling of Stress : Not on file  Social Connections:   . Frequency of Communication with Friends and Family: Not on file  . Frequency of Social  Gatherings with Friends and Family: Not on file  . Attends Religious Services: Not on file  . Active Member of Clubs or Organizations: Not on file  . Attends Archivist Meetings: Not on file  . Marital Status: Not on file  Intimate Partner Violence:   . Fear of Current or Ex-Partner: Not on file  . Emotionally Abused: Not on file  . Physically Abused: Not on file  . Sexually Abused: Not on file    ROS Review of Systems  All other systems reviewed and are negative.   Objective:   Today's Vitals: BP (!) 144/90 (BP Location: Left Arm, Patient Position: Sitting, Cuff Size: Large)   Pulse 81   Temp 98 F (36.7 C) (Oral)   Resp 18   Ht 5\' 6"  (1.676 m)   Wt 210 lb 12.8 oz (95.6 kg)   SpO2 97%   BMI 34.02 kg/m   Physical Exam Vitals and nursing note reviewed.  Constitutional:      Appearance: Normal appearance. He is well-developed and well-groomed. He is not ill-appearing.  HENT:     Head: Normocephalic.     Right Ear: Hearing, tympanic membrane, ear canal and external ear normal. There is no impacted cerumen.     Left Ear: Hearing, tympanic membrane, ear canal and external ear normal.     Ears:     Comments: Right cerumen partial impaction no desire ear flush    Nose: Nose normal.     Right Turbinates: Not enlarged, swollen or pale.     Left Turbinates: Not enlarged, swollen or pale.     Mouth/Throat:     Lips: Pink. No lesions.     Mouth: Mucous membranes are moist. No oral lesions.     Dentition: Normal dentition.     Tongue: No lesions. Tongue does not deviate from midline.     Pharynx: Oropharynx is clear.  Eyes:     General: Lids are normal. Lids are everted, no foreign bodies appreciated.     Extraocular Movements: Extraocular movements intact.     Conjunctiva/sclera: Conjunctivae normal.     Pupils: Pupils are equal, round, and reactive to light.  Neck:     Thyroid: No thyroid mass, thyromegaly or thyroid tenderness.  Cardiovascular:     Rate and  Rhythm: Normal rate and regular rhythm.     Pulses: Normal pulses.     Heart sounds: Normal heart sounds, S1 normal and S2 normal.  Pulmonary:     Effort: Pulmonary effort is normal.     Breath sounds: Normal breath sounds.  Chest:     Breasts: Breasts are symmetrical.   Abdominal:     General:  Abdomen is flat. Bowel sounds are normal.     Palpations: Abdomen is soft. There is no hepatomegaly, splenomegaly or pulsatile mass.     Tenderness: There is no abdominal tenderness. There is no guarding.  Musculoskeletal:        General: Normal range of motion.     Cervical back: Full passive range of motion without pain, normal range of motion and neck supple.  Lymphadenopathy:     Head:     Right side of head: No submental, submandibular, tonsillar, preauricular, posterior auricular or occipital adenopathy.     Left side of head: No submental, submandibular, tonsillar, preauricular, posterior auricular or occipital adenopathy.     Cervical: No cervical adenopathy.  Skin:    General: Skin is warm and dry.     Capillary Refill: Capillary refill takes less than 2 seconds.  Neurological:     General: No focal deficit present.     Mental Status: He is alert and oriented to person, place, and time.  Psychiatric:        Attention and Perception: Attention and perception normal.        Mood and Affect: Mood and affect normal.        Speech: Speech normal.        Behavior: Behavior normal. Behavior is cooperative.        Thought Content: Thought content normal.        Cognition and Memory: Cognition normal.        Judgment: Judgment normal.     Assessment & Plan:  Established care HTN: print out provided for lifestyle, medication, home monitoring, and diet management: stable Hyperlipidemia: will order labs O.B: follow up for increased or non resolving sxs.: stable arthritis : lab panel CAD, GERD< H/O NSTEMI, Angina: stable  Problem List Items Addressed This Visit      Cardiovascular  and Mediastinum   Benign essential HTN   CAD (coronary artery disease)     Digestive   GERD (gastroesophageal reflux disease)     Other   Hyperlipidemia   Tobacco abuse    Other Visit Diagnoses    Encounter to establish care    -  Primary   Arthralgia of both hands       Olecranon bursitis of right elbow       H/O non-ST elevation myocardial infarction (NSTEMI)          Outpatient Encounter Medications as of 12/09/2019  Medication Sig  . albuterol (PROVENTIL) (2.5 MG/3ML) 0.083% nebulizer solution Take 3 mLs by nebulization daily as needed for shortness of breath.  Marland Kitchen aspirin 81 MG EC tablet Take 1 tablet (81 mg total) by mouth daily.  Marland Kitchen dexamethasone (DECADRON) 4 MG tablet Take 1 tablet (4 mg total) by mouth 2 (two) times daily with a meal.  . dicyclomine (BENTYL) 10 MG capsule Take 1 capsule (10 mg total) by mouth 4 (four) times daily as needed for spasms.  Marland Kitchen ezetimibe (ZETIA) 10 MG tablet Take 1 tablet (10 mg total) by mouth daily.  . Glycerin-Hypromellose-PEG 400 (VISINE TEARS OP) Apply 1 drop to eye daily as needed (dry eyes).   Marland Kitchen ibuprofen (ADVIL) 800 MG tablet Take 1 tablet (800 mg total) by mouth every 8 (eight) hours as needed.  . isosorbide mononitrate (IMDUR) 60 MG 24 hr tablet TAKE ONE AND A HALF TABLETS BY MOUTH EVERY DAY (Patient taking differently: Take 90 mg by mouth daily. )  . lisinopril (ZESTRIL) 2.5 MG tablet TAKE 1 TABLET  BY MOUTH EVERY DAY (Patient taking differently: Take 2.5 mg by mouth daily. )  . meclizine (ANTIVERT) 12.5 MG tablet Take 1 tablet (12.5 mg total) by mouth 3 (three) times daily as needed for dizziness.  . metoprolol succinate (TOPROL-XL) 25 MG 24 hr tablet TAKE 1 TABLET BY MOUTH EVERY DAY (Patient taking differently: Take 25 mg by mouth daily. )  . nitroGLYCERIN (NITROSTAT) 0.4 MG SL tablet Place 1 tablet (0.4 mg total) under the tongue every 5 (five) minutes as needed for chest pain.  . pantoprazole (PROTONIX) 40 MG tablet TAKE 1 TABLET BY MOUTH  2 TIMES DAILY BEFORE A MEAL (Patient taking differently: Take 40 mg by mouth 2 (two) times daily before a meal. )  . predniSONE (DELTASONE) 10 MG tablet Take 6 tablets day one, 5 tablets day two, 4 tablets day three, 3 tablets day four, 2 tablets day five, then 1 tablet day six  . PROAIR HFA 108 (90 Base) MCG/ACT inhaler Inhale 2 puffs into the lungs every 4 (four) hours as needed for shortness of breath.  . rosuvastatin (CRESTOR) 40 MG tablet TAKE 1 TABLET BY MOUTH EVERY DAY AT 6 P.M. (Patient taking differently: Take 40 mg by mouth daily. )  . traMADol (ULTRAM) 50 MG tablet Take 1 tablet (50 mg total) by mouth every 6 (six) hours as needed for severe pain.  . [DISCONTINUED] ibuprofen (ADVIL) 800 MG tablet Take 1 tablet (800 mg total) by mouth every 8 (eight) hours as needed.  . [DISCONTINUED] penicillin v potassium (VEETID) 500 MG tablet Take 1 tablet (500 mg total) by mouth 4 (four) times daily.  . [DISCONTINUED] penicillin v potassium (VEETID) 500 MG tablet Take 1 tablet (500 mg total) by mouth 4 (four) times daily.  . [DISCONTINUED] traMADol (ULTRAM) 50 MG tablet Take 1 tablet (50 mg total) by mouth every 6 (six) hours as needed for severe pain.   No facility-administered encounter medications on file as of 12/09/2019.    Follow-up: Return in about 6 months (around 06/10/2020), or CPE with labs 1 week prior to appointment.   Annie Main, FNP

## 2019-12-21 ENCOUNTER — Ambulatory Visit: Payer: PRIVATE HEALTH INSURANCE | Attending: Internal Medicine

## 2019-12-21 DIAGNOSIS — Z23 Encounter for immunization: Secondary | ICD-10-CM

## 2019-12-21 NOTE — Progress Notes (Signed)
   Covid-19 Vaccination Clinic  Name:  Anthony Hensley    MRN: [id number] DOB: 1961-04-02  12/21/2019  [name] was observed post Covid-19 immunization for 15 minutes without incident. He was provided with Vaccine Information Sheet and instruction to access the V-Safe system.   [name] was instructed to call [phone] with any severe reactions post vaccine: Marland Kitchen Difficulty breathing  . Swelling of face and throat  . A fast heartbeat  . A bad rash all over body  . Dizziness and weakness   Immunizations Administered    Name Date Dose VIS Date Route   Moderna COVID-19 Vaccine 12/21/2019  9:47 AM 0.5 mL 09/10/2019 Intramuscular   Manufacturer: Moderna   Lot: [id number]   [id number]

## 2019-12-24 ENCOUNTER — Other Ambulatory Visit: Payer: Self-pay

## 2019-12-24 ENCOUNTER — Ambulatory Visit (INDEPENDENT_AMBULATORY_CARE_PROVIDER_SITE_OTHER): Payer: PRIVATE HEALTH INSURANCE | Admitting: Nurse Practitioner

## 2019-12-24 ENCOUNTER — Ambulatory Visit (HOSPITAL_COMMUNITY)
Admission: RE | Admit: 2019-12-24 | Discharge: 2019-12-24 | Disposition: A | Discharge status: Home / Self Care | Payer: PRIVATE HEALTH INSURANCE | Source: Ambulatory Visit | Attending: Nurse Practitioner | Admitting: Nurse Practitioner

## 2019-12-24 VITALS — BP 130/82 | HR 73 | Temp 97.7°F | Resp 18 | Ht 66.0 in | Wt 214.6 lb

## 2019-12-24 DIAGNOSIS — M25551 Pain in right hip: Secondary | ICD-10-CM
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
## 2019-12-25 ENCOUNTER — Other Ambulatory Visit: Payer: Self-pay | Admitting: Nurse Practitioner

## 2019-12-25 DIAGNOSIS — M25851 Other specified joint disorders, right hip: Secondary | ICD-10-CM

## 2019-12-25 NOTE — Progress Notes (Signed)
clinical diagnosis of right hip: femoroacetabular impingement we will treat initially with NSAIDS as discussed yesterday like Ibuprofen 600-800 mg every 8 hours taken with pepcid 20mg  to protect gut. He should avoid or minimizing activities that provoke symptoms, and PT I will order outpt today.

## 2020-01-01 ENCOUNTER — Ambulatory Visit: Payer: PRIVATE HEALTH INSURANCE | Attending: Nurse Practitioner

## 2020-01-01 ENCOUNTER — Other Ambulatory Visit: Payer: Self-pay

## 2020-01-01 DIAGNOSIS — M25551 Pain in right hip: Secondary | ICD-10-CM | POA: Diagnosis present

## 2020-01-01 DIAGNOSIS — R262 Difficulty in walking, not elsewhere classified: Secondary | ICD-10-CM | POA: Diagnosis present

## 2020-01-01 DIAGNOSIS — M6281 Muscle weakness (generalized): Secondary | ICD-10-CM | POA: Insufficient documentation

## 2020-01-01 DIAGNOSIS — R2681 Unsteadiness on feet: Secondary | ICD-10-CM | POA: Diagnosis present
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
## 2020-01-07 ENCOUNTER — Other Ambulatory Visit: Payer: Self-pay

## 2020-01-07 ENCOUNTER — Ambulatory Visit: Payer: PRIVATE HEALTH INSURANCE

## 2020-01-07 ENCOUNTER — Telehealth: Payer: Self-pay

## 2020-01-07 DIAGNOSIS — M25551 Pain in right hip: Secondary | ICD-10-CM

## 2020-01-07 DIAGNOSIS — M6281 Muscle weakness (generalized): Secondary | ICD-10-CM
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
## 2020-01-07 NOTE — Telephone Encounter (Signed)
Refill request received from [facility] for Pantoprazole 40 mg #180, take one tab bid before meals.

## 2020-01-07 NOTE — Patient Instructions (Signed)
Sitting on a chair,    Press yourself up with your hands to decrease pressure to your low back   Repeat 10 times, perform 3 sets

## 2020-01-09 ENCOUNTER — Ambulatory Visit: Payer: PRIVATE HEALTH INSURANCE | Attending: Nurse Practitioner

## 2020-01-09 ENCOUNTER — Other Ambulatory Visit: Payer: Self-pay

## 2020-01-09 DIAGNOSIS — R2681 Unsteadiness on feet: Secondary | ICD-10-CM | POA: Diagnosis present

## 2020-01-09 DIAGNOSIS — M25551 Pain in right hip: Secondary | ICD-10-CM | POA: Insufficient documentation

## 2020-01-09 DIAGNOSIS — M6281 Muscle weakness (generalized): Secondary | ICD-10-CM | POA: Diagnosis present

## 2020-01-09 DIAGNOSIS — R262 Difficulty in walking, not elsewhere classified: Secondary | ICD-10-CM | POA: Insufficient documentation
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
## 2020-01-13 ENCOUNTER — Telehealth: Payer: Self-pay | Admitting: *Deleted

## 2020-01-13 ENCOUNTER — Telehealth: Payer: Self-pay | Admitting: Nurse Practitioner

## 2020-01-13 MED ORDER — PANTOPRAZOLE SODIUM 40 MG PO TBEC: DELAYED_RELEASE_TABLET | ORAL | 2 refills | Status: DC

## 2020-01-13 NOTE — Addendum Note (Signed)
Addended by: [name] on: 01/13/2020 04:04 PM   Modules accepted: Orders

## 2020-01-13 NOTE — Telephone Encounter (Signed)
Patient needs refill on pantoprazole  [facility]

## 2020-01-13 NOTE — Telephone Encounter (Signed)
Received refill request fax for Pantoprazole 40mg  tab 1 tablet po BID before meals

## 2020-01-13 NOTE — Telephone Encounter (Signed)
Pt doesn't need a refill. He has refills at pharmacy.

## 2020-01-13 NOTE — Telephone Encounter (Signed)
I refilled medication. Needs office visit as it has been almost 2 years.

## 2020-01-14 ENCOUNTER — Ambulatory Visit: Payer: PRIVATE HEALTH INSURANCE

## 2020-01-16 ENCOUNTER — Ambulatory Visit: Payer: PRIVATE HEALTH INSURANCE

## 2020-01-16 ENCOUNTER — Other Ambulatory Visit: Payer: Self-pay

## 2020-01-16 DIAGNOSIS — R262 Difficulty in walking, not elsewhere classified: Secondary | ICD-10-CM

## 2020-01-16 DIAGNOSIS — R2681 Unsteadiness on feet: Secondary | ICD-10-CM

## 2020-01-16 DIAGNOSIS — M6281 Muscle weakness (generalized): Secondary | ICD-10-CM

## 2020-01-16 DIAGNOSIS — M25551 Pain in right hip: Secondary | ICD-10-CM
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
## 2020-01-16 NOTE — Patient Instructions (Signed)
Sitting with low back support  Sit down on a chair    Place a towel roll at your low back level    Gently lean back against the towel roll comfortably    Perform for 2 minutes     Repeat 3 times during the day     Hold off on the leaning forward and to the L exercise

## 2020-01-18 ENCOUNTER — Ambulatory Visit: Payer: PRIVATE HEALTH INSURANCE | Attending: Internal Medicine

## 2020-01-18 DIAGNOSIS — Z23 Encounter for immunization: Secondary | ICD-10-CM

## 2020-01-18 NOTE — Progress Notes (Signed)
   Covid-19 Vaccination Clinic  Name:  Anthony Hensley    MRN: [id number] DOB: 1960/11/11  01/18/2020  [name] was observed post Covid-19 immunization for 15 minutes without incident. He was provided with Vaccine Information Sheet and instruction to access the V-Safe system.   [name] was instructed to call 911 with any severe reactions post vaccine: Marland Kitchen Difficulty breathing  . Swelling of face and throat  . A fast heartbeat  . A bad rash all over body  . Dizziness and weakness   Immunizations Administered    Name Date Dose VIS Date Route   Moderna COVID-19 Vaccine 01/18/2020 11:42 AM 0.5 mL 09/10/2019 Intramuscular   Manufacturer: Levan Hurst   LotVN:[id number]   [place]:[id number]

## 2020-01-21 ENCOUNTER — Other Ambulatory Visit: Payer: Self-pay

## 2020-01-21 ENCOUNTER — Ambulatory Visit: Payer: PRIVATE HEALTH INSURANCE

## 2020-01-21 DIAGNOSIS — M25551 Pain in right hip: Secondary | ICD-10-CM

## 2020-01-21 DIAGNOSIS — R2681 Unsteadiness on feet: Secondary | ICD-10-CM

## 2020-01-21 DIAGNOSIS — R262 Difficulty in walking, not elsewhere classified: Secondary | ICD-10-CM

## 2020-01-21 DIAGNOSIS — M6281 Muscle weakness (generalized): Secondary | ICD-10-CM

## 2020-01-21 NOTE — Patient Instructions (Addendum)
  Access Code: [email or web address] URL: [email or web address] Date: 01/21/2020 Prepared by: Joneen Boers  Exercises Seated Thoracic Flexion and Rotation with Swiss Ball - 3 x daily - 7 x weekly - 1 sets - 10 reps - 10 to 15 seconds hold Standing Gluteal Sets - 5 x daily - 7 x weekly - 3 sets - 10 reps - 5 seconds hold Seated Shoulder Press Ups with Armchair - 5 x daily - 7 x weekly - 1 sets - 10 reps - 5 seconds hold
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
## 2020-01-22 ENCOUNTER — Ambulatory Visit: Payer: PRIVATE HEALTH INSURANCE

## 2020-01-23 ENCOUNTER — Other Ambulatory Visit: Payer: Self-pay

## 2020-01-23 ENCOUNTER — Ambulatory Visit: Payer: PRIVATE HEALTH INSURANCE

## 2020-01-23 DIAGNOSIS — M25551 Pain in right hip: Secondary | ICD-10-CM | POA: Diagnosis not present

## 2020-01-23 DIAGNOSIS — R262 Difficulty in walking, not elsewhere classified: Secondary | ICD-10-CM

## 2020-01-23 DIAGNOSIS — R2681 Unsteadiness on feet: Secondary | ICD-10-CM

## 2020-01-23 DIAGNOSIS — M6281 Muscle weakness (generalized): Secondary | ICD-10-CM

## 2020-01-23 NOTE — Patient Instructions (Signed)
Access Code: [email or web address] URL: [email or web address] Date: 01/23/2020 Prepared by: Joneen Boers  Exercises Seated Thoracic Flexion and Rotation with Swiss Ball - 3 x daily - 7 x weekly - 1 sets - 10 reps - 10 to 15 seconds hold Standing Gluteal Sets - 5 x daily - 7 x weekly - 3 sets - 10 reps - 5 seconds hold Seated Shoulder Press Ups with Armchair - 5 x daily - 7 x weekly - 1 sets - 10 reps - 5 seconds hold Seated Transversus Abdominis Bracing - 1 x daily - 7 x weekly - 3 sets - 10 reps - 5 seconds hold
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
## 2020-01-28 ENCOUNTER — Other Ambulatory Visit: Payer: Self-pay

## 2020-01-28 ENCOUNTER — Ambulatory Visit: Payer: PRIVATE HEALTH INSURANCE

## 2020-01-28 DIAGNOSIS — M25551 Pain in right hip: Secondary | ICD-10-CM

## 2020-01-28 DIAGNOSIS — R262 Difficulty in walking, not elsewhere classified: Secondary | ICD-10-CM

## 2020-01-28 DIAGNOSIS — M6281 Muscle weakness (generalized): Secondary | ICD-10-CM

## 2020-01-28 DIAGNOSIS — R2681 Unsteadiness on feet: Secondary | ICD-10-CM

## 2020-01-28 NOTE — Patient Instructions (Signed)
Access Code: [email or web address] URL: [email or web address] Date: 01/28/2020 Prepared by: Joneen Boers  Exercises Seated Thoracic Flexion and Rotation with Swiss Ball - 3 x daily - 7 x weekly - 1 sets - 10 reps - 10 to 15 seconds hold Standing Gluteal Sets - 5 x daily - 7 x weekly - 3 sets - 10 reps - 5 seconds hold Seated Shoulder Press Ups with Armchair - 5 x daily - 7 x weekly - 1 sets - 10 reps - 5 seconds hold Seated Transversus Abdominis Bracing - 1 x daily - 7 x weekly - 3 sets - 10 reps - 5 seconds hold Supine Posterior Pelvic Tilt - 1 x daily - 7 x weekly - 3 sets - 10 reps - 5 seconds hold
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
## 2020-01-30 ENCOUNTER — Telehealth: Payer: Self-pay

## 2020-01-30 ENCOUNTER — Ambulatory Visit: Payer: PRIVATE HEALTH INSURANCE

## 2020-01-30 NOTE — Telephone Encounter (Signed)
Pt called clinic to cancel his appointment secondary to R hip bothering him too much this morning. Called patient back. Pt states that he woke up on his back this morning with 10/10 pain. Was fine yesterday with 4-5/10 R hip pain prior to going to bed. Asked pt if he used heating pad as well as if he performed seated press-up exercise to decrease pressure to his back this morning. Pt states yes but they did not help. Pt was recommended to perform seated transversus abdominis contraction, glute max squeeze, and scapular retraction (to promote thoracic extension and decrease extension stress to low back) 10x3 with 5 seconds every 2 hours as well as to continue using heating pad to his low back (lumbar paraspinal and quadratus lumborum muscles/area worked on during manual therapy) to decrease muscle tension to low back 15 min at a time with a cloth barrier between skin and heating pad. Pt verbalized understanding. Pt states that the latter 3 exercises helped ease of his R hip pain a little while performing them in sitting while on the phone.

## 2020-02-12 ENCOUNTER — Other Ambulatory Visit: Payer: Self-pay

## 2020-02-12 ENCOUNTER — Ambulatory Visit: Payer: PRIVATE HEALTH INSURANCE | Attending: Nurse Practitioner

## 2020-02-12 DIAGNOSIS — R2681 Unsteadiness on feet: Secondary | ICD-10-CM

## 2020-02-12 DIAGNOSIS — R262 Difficulty in walking, not elsewhere classified: Secondary | ICD-10-CM

## 2020-02-12 DIAGNOSIS — M6281 Muscle weakness (generalized): Secondary | ICD-10-CM

## 2020-02-12 DIAGNOSIS — M25551 Pain in right hip: Secondary | ICD-10-CM
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
## 2020-02-17 ENCOUNTER — Ambulatory Visit (INDEPENDENT_AMBULATORY_CARE_PROVIDER_SITE_OTHER): Payer: Self-pay | Admitting: Nurse Practitioner

## 2020-02-17 ENCOUNTER — Other Ambulatory Visit: Payer: Self-pay

## 2020-02-17 VITALS — BP 110/68 | HR 78 | Temp 98.1°F | Resp 18 | Wt 216.0 lb

## 2020-02-17 DIAGNOSIS — E785 Hyperlipidemia, unspecified: Secondary | ICD-10-CM

## 2020-02-17 DIAGNOSIS — I25119 Atherosclerotic heart disease of native coronary artery with unspecified angina pectoris: Secondary | ICD-10-CM

## 2020-02-17 DIAGNOSIS — I10 Essential (primary) hypertension: Secondary | ICD-10-CM

## 2020-02-17 DIAGNOSIS — R52 Pain, unspecified: Secondary | ICD-10-CM

## 2020-02-17 DIAGNOSIS — Z7409 Other reduced mobility: Secondary | ICD-10-CM

## 2020-02-17 DIAGNOSIS — M25551 Pain in right hip: Secondary | ICD-10-CM

## 2020-02-17 DIAGNOSIS — G8929 Other chronic pain: Secondary | ICD-10-CM

## 2020-02-17 NOTE — Progress Notes (Signed)
Established Patient Office Visit  Subjective:  Patient ID: Anthony Hensley, male    DOB: 1961-05-12  Age: 59 y.o. MRN: [id number]  CC:  Chief Complaint  Patient presents with  . Follow-up    on PT    HPI Anthony Hensley presents for follow up after completing PT. The pt reports some improvement after PT completion but the chronic pain continues. 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 He also reports that the chronic pain can cause his gait to become imbalance at times and this can be dangerous working around machines. He will continue treatment plan of using medicated and non medicated treatments remedies as prior planned and if begins to decline from the baseline established at present with PT will order PT again. No cp,ct, gu/gi sxs, other pain, sob, edema, or recent falls.   Past Medical History:  Diagnosis Date  . Anginal pain ([place])   . Arthritis    " IN MY NECK & SHOULDERS "  . CAD (coronary artery disease)    DES to mid circumflex October 2016  . Diverticulosis   . Dyspnea     AT TIMES"  . GERD (gastroesophageal reflux disease)   . Hypercholesterolemia   . Hypertension   . NSTEMI (non-ST elevated myocardial infarction) [facility])    October 2016    Past Surgical History:  Procedure Laterality Date  . CARDIAC CATHETERIZATION N/A 07/13/2015   Procedure: Left Heart Cath and Coronary Angiography;  Surgeon: Burnell Blanks, MD;  Location: [place] [facility];  Service: Cardiovascular;  Laterality: N/A;  . CARDIAC CATHETERIZATION N/A 07/13/2015   PCI + DES to the mid circ. LVEF was normal at 65%  . COLONOSCOPY N/A 12/31/2015   Dr.Rourk- diverticulosis,101mm polyp in the splenic flexure, 18mm polyp in the splenic flexure, 99mm polyp in the sigmoid colon bx= traditional serrated adenoma  .  ESOPHAGOGASTRODUODENOSCOPY N/A 12/31/2015   Dr.Rourk- esophagitis with no bleeding, diffuse moderately erythematous mucosa without bleeding was found in the entire examined stomach. stomach bx= slight chronic inflammation. esophagus bx= benign gastresophageal junction mucosa  . LEFT HEART CATH AND CORONARY ANGIOGRAPHY N/A 03/20/2017   Procedure: Left Heart Cath and Coronary Angiography;  Surgeon: Martinique, Peter M, MD;  Location: [place] [facility];  Service: Cardiovascular;  Laterality: N/A;  . Venia Minks DILATION N/A 12/31/2015   Procedure: Venia Minks DILATION;  Surgeon: [name], MD;  [facility];  Service: Endoscopy;  Laterality: N/A;    Family History  Problem Relation Age of Onset  . Heart attack Father 75       Deceased  . Colon cancer Neg Hx     Social History   Socioeconomic History  . Marital status: Married    Spouse name: Not on file  . Number of children: Not on file  . Years of education: Not on file  . Highest education level: Not on file  Occupational History  . 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 Smoking status: Former Smoker    Packs/day: 0.75    Years: 35.00    Pack years: 26.25    Types: Cigarettes    Start date: 08/08/1976    Quit date: 02/21/2018    Years since  quitting: 1.9  . Smokeless tobacco: Never Used  Substance and Sexual Activity  . Alcohol use: No    Alcohol/week: 0.0 standard drinks  . Drug use: No  . Sexual activity: Not on file  Other Topics Concern  . Not on file  Social History Narrative  . Not on file   Social Determinants of Health   Financial Resource Strain:   . Difficulty of Paying Living Expenses:   Food Insecurity:   . Worried About Charity fundraiser in the Last Year:   . Arboriculturist in the Last Year:   Transportation Needs:   . Film/video editor (Medical):   Marland Kitchen Lack of Transportation (Non-Medical):   Physical Activity:   . Days of  Exercise per Week:   . Minutes of Exercise per Session:   Stress:   . Feeling of Stress :   Social Connections:   . Frequency of Communication with Friends and Family:   . Frequency of Social Gatherings with Friends and Family:   . Attends Religious Services:   . Active Member of Clubs or Organizations:   . Attends Archivist Meetings:   Marland Kitchen Marital Status:   Intimate Partner Violence:   . Fear of Current or Ex-Partner:   . Emotionally Abused:   Marland Kitchen Physically Abused:   . Sexually Abused:     Outpatient Medications Prior to Visit  Medication Sig Dispense Refill  . albuterol (PROVENTIL) (2.5 MG/3ML) 0.083% nebulizer solution Take 3 mLs by nebulization daily as needed for shortness of breath.  3  . aspirin 81 MG EC tablet Take 1 tablet (81 mg total) by mouth daily. 30 tablet   . ezetimibe (ZETIA) 10 MG tablet Take 1 tablet (10 mg total) by mouth daily. 90 tablet 3  . Glycerin-Hypromellose-PEG 400 (VISINE TEARS OP) Apply 1 drop to eye daily as needed (dry eyes).     Marland Kitchen ibuprofen (ADVIL) 800 MG tablet Take 1 tablet (800 mg total) by mouth every 8 (eight) hours as needed. 21 tablet 0  . isosorbide mononitrate (IMDUR) 60 MG 24 hr tablet TAKE ONE AND A HALF TABLETS BY MOUTH EVERY DAY (Patient taking differently: Take 90 mg by mouth daily. ) 45 tablet 6  . lisinopril (ZESTRIL) 2.5 MG tablet TAKE 1 TABLET BY MOUTH EVERY DAY (Patient taking differently: Take 2.5 mg by mouth daily. ) 90 tablet 3  . metoprolol succinate (TOPROL-XL) 25 MG 24 hr tablet TAKE 1 TABLET BY MOUTH EVERY DAY (Patient taking differently: Take 25 mg by mouth daily. ) 90 tablet 3  . nitroGLYCERIN (NITROSTAT) 0.4 MG SL tablet Place 1 tablet (0.4 mg total) under the tongue every 5 (five) minutes as needed for chest pain. 90 tablet 3  . pantoprazole (PROTONIX) 40 MG tablet TAKE 1 TABLET BY MOUTH 2 TIMES DAILY BEFORE A MEAL 180 tablet 2  . PROAIR HFA 108 (90 Base) MCG/ACT inhaler Inhale 2 puffs into the lungs every 4 (four)  hours as needed for shortness of breath.  3  . rosuvastatin (CRESTOR) 40 MG tablet TAKE 1 TABLET BY MOUTH EVERY DAY AT 6 P.M. (Patient taking differently: Take 40 mg by mouth daily. ) 90 tablet 3  . dexamethasone (DECADRON) 4 MG tablet Take 1 tablet (4 mg total) by mouth 2 (two) times daily with a meal. 10 tablet 0  . dicyclomine (BENTYL) 10 MG capsule Take 1 capsule (10 mg total) by mouth 4 (four) times daily as needed for spasms. 90 capsule 1  .  meclizine (ANTIVERT) 12.5 MG tablet Take 1 tablet (12.5 mg total) by mouth 3 (three) times daily as needed for dizziness. 30 tablet 0  . predniSONE (DELTASONE) 10 MG tablet Take 6 tablets day one, 5 tablets day two, 4 tablets day three, 3 tablets day four, 2 tablets day five, then 1 tablet day six 21 tablet 0   No facility-administered medications prior to visit.    No Known Allergies  ROS Review of Systems  All other systems reviewed and are negative.     Objective:    Physical Exam  Constitutional: He is oriented to person, place, and time. He appears well-developed and well-nourished.  Non-toxic appearance. He does not appear ill.  HENT:  Head: Normocephalic.  Eyes: Pupils are equal, round, and reactive to light. Conjunctivae and EOM are normal. No scleral icterus.  Neck: No JVD present.  Cardiovascular: Normal rate.  Pulmonary/Chest: Effort normal.  Abdominal: Soft.  Musculoskeletal:        General: No edema.     Cervical back: Normal range of motion and neck supple.     Right hip: No swelling, deformity or crepitus. Decreased range of motion. Decreased strength.  Neurological: He is alert and oriented to person, place, and time.  Skin: Skin is warm and dry.  Psychiatric: He has a normal mood and affect. His behavior is normal. Judgment and thought content normal.  Nursing note and vitals reviewed.   BP 110/68 (BP Location: Left Arm, Patient Position: Sitting, Cuff Size: Normal)   Pulse 78   Temp 98.1 F (36.7 C) (Temporal)    Resp 18   Wt 216 lb (98 kg)   SpO2 94%   BMI 34.86 kg/m  Wt Readings from Last 3 Encounters:  02/17/20 216 lb (98 kg)  12/24/19 214 lb 9.6 oz (97.3 kg)  12/09/19 210 lb 12.8 oz (95.6 kg)     Health Maintenance Due  Topic Date Due  . Hepatitis C Screening  Never done  . TETANUS/TDAP  Never done    There are no preventive care reminders to display for this patient.  Lab Results  Component Value Date   TSH 0.875 12/19/2017   Lab Results  Component Value Date   WBC 5.2 10/30/2019   HGB 15.6 10/30/2019   HCT 48.0 10/30/2019   MCV 92.1 10/30/2019   PLT 234 10/30/2019   Lab Results  Component Value Date   NA 139 10/30/2019   K 3.9 10/30/2019   CO2 27 10/30/2019   GLUCOSE 86 10/30/2019   BUN 19 10/30/2019   CREATININE 0.73 10/30/2019   BILITOT 0.2 (L) 01/13/2019   ALKPHOS 61 01/13/2019   AST 21 01/13/2019   ALT 29 01/13/2019   PROT 6.6 01/13/2019   ALBUMIN 4.0 01/13/2019   CALCIUM 9.2 10/30/2019   ANIONGAP 8 10/30/2019   Lab Results  Component Value Date   CHOL 162 06/14/2019   Lab Results  Component Value Date   HDL 48 06/14/2019   Lab Results  Component Value Date   LDLCALC 87 06/14/2019   Lab Results  Component Value Date   TRIG 136 06/14/2019   Lab Results  Component Value Date   CHOLHDL 3.4 06/14/2019   Lab Results  Component Value Date   HGBA1C 5.7 (H) 12/19/2017      Assessment & Plan:  Will refer to neuro per pts desire for general physical decline and family h/o ALS Will refer to orthopedic for second opinion and need for surgical repair of right  hip.  Follow up in 3 month for general exam and labs due. HTN stable  Problem List Items Addressed This Visit      Cardiovascular and Mediastinum   Benign essential HTN - Primary   Relevant Orders   CBC with Differential/Platelet   COMPLETE METABOLIC PANEL WITH GFR   CAD (coronary artery disease)   Relevant Orders   CBC with Differential/Platelet   COMPLETE METABOLIC PANEL WITH GFR       Other   Hyperlipidemia   Relevant Orders   Lipid panel      No orders of the defined types were placed in this encounter.   Follow-up: Return in about 3 months (around 05/19/2020) for labs one week prior.    Annie Main, FNP

## 2020-02-21 ENCOUNTER — Encounter: Payer: Self-pay | Admitting: Orthopaedic Surgery

## 2020-02-21 ENCOUNTER — Other Ambulatory Visit: Payer: Self-pay

## 2020-02-21 ENCOUNTER — Ambulatory Visit (INDEPENDENT_AMBULATORY_CARE_PROVIDER_SITE_OTHER): Payer: PRIVATE HEALTH INSURANCE | Admitting: Orthopaedic Surgery

## 2020-02-21 DIAGNOSIS — M25551 Pain in right hip: Secondary | ICD-10-CM | POA: Diagnosis not present

## 2020-02-21 MED ORDER — BUPIVACAINE HCL 0.5 % IJ SOLN
3.0000 mL | INTRAMUSCULAR | Status: AC | PRN
  Administered 2020-02-21: 3 mL via INTRA_ARTICULAR

## 2020-02-21 MED ORDER — LIDOCAINE HCL 1 % IJ SOLN
3.0000 mL | INTRAMUSCULAR | Status: AC | PRN
  Administered 2020-02-21: 3 mL

## 2020-02-21 MED ORDER — METHYLPREDNISOLONE ACETATE 40 MG/ML IJ SUSP
40.0000 mg | INTRAMUSCULAR | Status: AC | PRN
  Administered 2020-02-21: 40 mg via INTRA_ARTICULAR

## 2020-02-21 NOTE — Progress Notes (Signed)
Office Visit Note   Patient: Anthony Hensley           Date of Birth: [birth year]           MRN: [id number] Visit Date: 02/21/2020              Requested by: Annie Main, FNP [address] [place],  [place] [address] PCP: Annie Main, FNP   Assessment & Plan: Visit Diagnoses:  1. Pain in right hip     Plan: Impression is right hip pain due to bursitis versus abductor tendinosis.  Based on discussion we performed cortisone injection as well as provide him with home exercises.  Weight loss counseling was also provided today.  Follow-up as needed.  Follow-Up Instructions: Return if symptoms worsen or fail to improve.   Orders:  No orders of the defined types were placed in this encounter.  No orders of the defined types were placed in this encounter.     Procedures: Large Joint Inj: R greater trochanter on 02/21/2020 8:20 AM Indications: pain Details: 22 G needle  Arthrogram: No  Medications: 3 mL lidocaine 1 %; 3 mL bupivacaine 0.5 %; 40 mg methylPREDNISolone acetate 40 MG/ML Patient was prepped and draped in the usual sterile fashion.       Clinical Data: No additional findings.   Subjective: Chief Complaint  Patient presents with  . Right Hip - Pain    Anthony Hensley is a 59 year old gentleman comes in for evaluation of a chronic lateral right hip pain.  Denies any radiculopathy or any groin pain.  He may have had a remote injury but no significant correlation.  Denies any numbness and tingling.  There is some burning pain at times at runs down the lateral side of his leg.  He ambulates with a cane.   Review of Systems  Constitutional: Negative.   All other systems reviewed and are negative.    Objective: Vital Signs: There were no vitals taken for this visit.  Physical Exam Vitals and nursing note reviewed.  Constitutional:      Appearance: He is well-developed.  HENT:     Head: Normocephalic and atraumatic.  Eyes:     Pupils: Pupils are equal,  round, and reactive to light.  Pulmonary:     Effort: Pulmonary effort is normal.  Abdominal:     Palpations: Abdomen is soft.  Musculoskeletal:        General: Normal range of motion.     Cervical back: Neck supple.  Skin:    General: Skin is warm.  Neurological:     Mental Status: He is alert and oriented to person, place, and time.  Psychiatric:        Behavior: Behavior normal.        Thought Content: Thought content normal.        Judgment: Judgment normal.     Ortho Exam Right hip shows a normal range of motion with lateral hip pain.  The lateral aspect of the hip is tender to palpation.  Negative [place] negative logroll Specialty Comments:  No specialty comments available.  Imaging: No results found.   PMFS History: Patient Active Problem List   Diagnosis Date Noted  . Pain in right hip 02/21/2020  . Coronary artery disease involving native heart with angina pectoris ([place]) 03/20/2017  . CAD (coronary artery disease) 03/20/2017  . Unstable angina ([place])   . Coronary artery disease involving native coronary artery of native heart with angina pectoris (  [place]) 03/17/2017  . 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 Precordial chest pain   . Diarrhea 09/26/2016  . Enteritis 09/26/2016  . GERD (gastroesophageal reflux disease) 06/08/2016  . Constipation 06/08/2016  . Mucosal abnormality of stomach   . Mucosal abnormality of esophagus   . History of colonic polyps   . Diverticulosis of colon without hemorrhage   . Dysphagia 11/27/2015  . Rectal bleeding 11/27/2015  . Abdominal pain 11/27/2015  . Chest pain at rest   . NSTEMI (non-ST elevated myocardial infarction) ([place])   . ACS (acute coronary syndrome) ([place]) 07/12/2015  . Hyperlipidemia 07/12/2015  . Chest pain 07/11/2015  . Benign essential HTN 07/11/2015  . LUMBAR SPRAIN AND STRAIN 06/08/2009   Past Medical History:  Diagnosis Date  .  Anginal pain ([place])   . Arthritis    " IN MY NECK & SHOULDERS "  . CAD (coronary artery disease)    DES to mid circumflex October 2016  . Diverticulosis   . Dyspnea     AT TIMES"  . GERD (gastroesophageal reflux disease)   . Hypercholesterolemia   . Hypertension   . NSTEMI (non-ST elevated myocardial infarction) [facility])    October 2016    Family History  Problem Relation Age of Onset  . Heart attack Father 46       Deceased  . Colon cancer Neg Hx     Past Surgical History:  Procedure Laterality Date  . CARDIAC CATHETERIZATION N/A 07/13/2015   Procedure: Left Heart Cath and Coronary Angiography;  Surgeon: Burnell Blanks, MD;  Location: [facility];  Service: Cardiovascular;  Laterality: N/A;  . CARDIAC CATHETERIZATION N/A 07/13/2015   PCI + DES to the mid circ. LVEF was normal at 65%  . COLONOSCOPY N/A 12/31/2015   Dr.Rourk- diverticulosis,20mm polyp in the splenic flexure, 33mm polyp in the splenic flexure, 11mm polyp in the sigmoid colon bx= traditional serrated adenoma  . ESOPHAGOGASTRODUODENOSCOPY N/A 12/31/2015   Dr.Rourk- esophagitis with no bleeding, diffuse moderately erythematous mucosa without bleeding was found in the entire examined stomach. stomach bx= slight chronic inflammation. esophagus bx= benign gastresophageal junction mucosa  . LEFT HEART CATH AND CORONARY ANGIOGRAPHY N/A 03/20/2017   Procedure: Left Heart Cath and Coronary Angiography;  Surgeon: Martinique, Peter M, MD;  Location: [facility];  Service: Cardiovascular;  Laterality: N/A;  . Venia Minks DILATION N/A 12/31/2015   Procedure: Venia Minks DILATION;  Surgeon: [name], MD;  [facility];  Service: Endoscopy;  Laterality: N/A;   Social History   Occupational History  . 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 Smoking status: Former Smoker    Packs/day: 0.75    Years: 35.00    Pack years: 26.25    Types: Cigarettes     Start date: 08/08/1976    Quit date: 02/21/2018    Years since quitting: 2.0  . Smokeless tobacco: Never Used  Substance and Sexual Activity  . Alcohol use: No    Alcohol/week: 0.0 standard drinks  . Drug use: No  . Sexual activity: Not on file

## 2020-02-26 ENCOUNTER — Telehealth: Payer: Self-pay | Admitting: Nurse Practitioner

## 2020-02-26 ENCOUNTER — Ambulatory Visit: Payer: Self-pay | Admitting: Nurse Practitioner

## 2020-02-26 DIAGNOSIS — R251 Tremor, unspecified: Secondary | ICD-10-CM

## 2020-02-26 DIAGNOSIS — R2689 Other abnormalities of gait and mobility: Secondary | ICD-10-CM

## 2020-02-26 NOTE — Telephone Encounter (Signed)
Called patient to inform him that Neurology declined his referral for family history of ALS. Patient verbalized understanding and stated that he also has symptoms. He states that his balance is off and he has leg shakiness. Spoke with [employer] and was told that since he actually has symptoms and a family hx to go ahead and resend referral to Neurology. Patient notified of new referral and orders placed.

## 2020-03-02 ENCOUNTER — Encounter: Payer: Self-pay | Admitting: Nurse Practitioner

## 2020-03-02 ENCOUNTER — Other Ambulatory Visit: Payer: Self-pay

## 2020-03-02 ENCOUNTER — Ambulatory Visit (INDEPENDENT_AMBULATORY_CARE_PROVIDER_SITE_OTHER): Payer: Self-pay | Admitting: Nurse Practitioner

## 2020-03-02 VITALS — BP 124/90 | HR 73 | Temp 97.6°F | Resp 18 | Wt 213.2 lb

## 2020-03-02 DIAGNOSIS — S39012A Strain of muscle, fascia and tendon of lower back, initial encounter: Secondary | ICD-10-CM

## 2020-03-02 MED ORDER — PREDNISONE 10 MG PO TABS: ORAL_TABLET | ORAL | 0 refills | Status: DC

## 2020-03-02 MED ORDER — NAPROXEN 500 MG PO TABS: 500.0000 mg | ORAL_TABLET | Freq: Two times a day (BID) | ORAL | 0 refills | Status: DC

## 2020-03-02 NOTE — Patient Instructions (Signed)
1. Rest. 2. Applying heat and cold to the affected area. 3. Prescribed medicines to help relieve pain and inflammation may be needed for a short time. 4. Follow up in one week for non improved symptoms or worsening

## 2020-03-02 NOTE — Progress Notes (Signed)
Acute Office Visit  Subjective:    Patient ID: Anthony Hensley, male    DOB: [birth year], 59 y.o.   MRN: [id number]  Chief Complaint  Patient presents with  . Back Pain    no injury, pt states he was putting miracle grow on his garden and it started hurting reall bad starting 02/29/2020, advil 800mg  was taken with no relief    HPI Patient is in today for lumbar discomfort.  The patient reports that Thursday he was playing things carving a night and prescribed fertilizer.  He noticed within 30 minutes he began to have mild back pain.  The pain began to ease off about 30 minutes later.  He denies finally wanting the pain mostly in pain intermittent throughout the day however Saturday morning the pain was severe and worsening and continued all day.  Sunday the pain continued all day.  Family evening he noticed a right shooting pain from his lumbar down his right leg to his knee.  This happened once.  Patient has tried no treatment other than rest.  The patient is still able to walk as usual using his cane.  Today the pain is off and on.  The pain is exacerbating with bending and lifting pushing and pulling.  Change in gu/gi habits.  The patient would like treatment that will not make him drowsy.  Past Medical History:  Diagnosis Date  . Anginal pain ([place])   . Arthritis    " IN MY NECK & SHOULDERS "  . CAD (coronary artery disease)    DES to mid circumflex October 2016  . Diverticulosis   . Dyspnea     AT TIMES"  . GERD (gastroesophageal reflux disease)   . Hypercholesterolemia   . Hypertension   . NSTEMI (non-ST elevated myocardial infarction) [facility])    October 2016    Past Surgical History:  Procedure Laterality Date  . CARDIAC CATHETERIZATION N/A 07/13/2015   Procedure: Left Heart Cath and Coronary Angiography;  Surgeon: Burnell Blanks, MD;  Location: [place] [facility];  Service: Cardiovascular;  Laterality: N/A;  . CARDIAC CATHETERIZATION N/A 07/13/2015   PCI + DES to the  mid circ. LVEF was normal at 65%  . COLONOSCOPY N/A 12/31/2015   Dr.Rourk- diverticulosis,72mm polyp in the splenic flexure, 97mm polyp in the splenic flexure, 58mm polyp in the sigmoid colon bx= traditional serrated adenoma  . ESOPHAGOGASTRODUODENOSCOPY N/A 12/31/2015   Dr.Rourk- esophagitis with no bleeding, diffuse moderately erythematous mucosa without bleeding was found in the entire examined stomach. stomach bx= slight chronic inflammation. esophagus bx= benign gastresophageal junction mucosa  . LEFT HEART CATH AND CORONARY ANGIOGRAPHY N/A 03/20/2017   Procedure: Left Heart Cath and Coronary Angiography;  Surgeon: Martinique, Peter M, MD;  Location: [facility];  Service: Cardiovascular;  Laterality: N/A;  . Venia Minks DILATION N/A 12/31/2015   Procedure: Venia Minks DILATION;  Surgeon: [name], MD;  [facility];  Service: Endoscopy;  Laterality: N/A;    Family History  Problem Relation Age of Onset  . Heart attack Father 35       Deceased  . Colon cancer Neg Hx     Social History   Socioeconomic History  . Marital status: Married    Spouse name: Not on file  . Number of children: Not on file  . Years of education: Not on file  . Highest education level: Not on file  Occupational History  . 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 Smoking status: Former Smoker    Packs/day: 0.75    Years: 35.00    Pack years: 26.25    Types: Cigarettes    Start date: 08/08/1976    Quit date: 02/21/2018    Years since quitting: 2.0  . Smokeless tobacco: Never Used  Substance and Sexual Activity  . Alcohol use: No    Alcohol/week: 0.0 standard drinks  . Drug use: No  . Sexual activity: Not on file  Other Topics Concern  . Not on file  Social History Narrative  . Not on file   Social Determinants of Health   Financial Resource Strain:   . Difficulty of Paying Living Expenses:   Food Insecurity:   . Worried  About Charity fundraiser in the Last Year:   . Arboriculturist in the Last Year:   Transportation Needs:   . Film/video editor (Medical):   Marland Kitchen Lack of Transportation (Non-Medical):   Physical Activity:   . Days of Exercise per Week:   . Minutes of Exercise per Session:   Stress:   . Feeling of Stress :   Social Connections:   . Frequency of Communication with Friends and Family:   . Frequency of Social Gatherings with Friends and Family:   . Attends Religious Services:   . Active Member of Clubs or Organizations:   . Attends Archivist Meetings:   Marland Kitchen Marital Status:   Intimate Partner Violence:   . Fear of Current or Ex-Partner:   . Emotionally Abused:   Marland Kitchen Physically Abused:   . Sexually Abused:     Outpatient Medications Prior to Visit  Medication Sig Dispense Refill  . albuterol (PROVENTIL) (2.5 MG/3ML) 0.083% nebulizer solution Take 3 mLs by nebulization daily as needed for shortness of breath.  3  . aspirin 81 MG EC tablet Take 1 tablet (81 mg total) by mouth daily. 30 tablet   . ezetimibe (ZETIA) 10 MG tablet Take 1 tablet (10 mg total) by mouth daily. 90 tablet 3  . Glycerin-Hypromellose-PEG 400 (VISINE TEARS OP) Apply 1 drop to eye daily as needed (dry eyes).     Marland Kitchen ibuprofen (ADVIL) 800 MG tablet Take 1 tablet (800 mg total) by mouth every 8 (eight) hours as needed. 21 tablet 0  . isosorbide mononitrate (IMDUR) 60 MG 24 hr tablet TAKE ONE AND A HALF TABLETS BY MOUTH EVERY DAY (Patient taking differently: Take 90 mg by mouth daily. ) 45 tablet 6  . lisinopril (ZESTRIL) 2.5 MG tablet TAKE 1 TABLET BY MOUTH EVERY DAY (Patient taking differently: Take 2.5 mg by mouth daily. ) 90 tablet 3  . metoprolol succinate (TOPROL-XL) 25 MG 24 hr tablet TAKE 1 TABLET BY MOUTH EVERY DAY (Patient taking differently: Take 25 mg by mouth daily. ) 90 tablet 3  . pantoprazole (PROTONIX) 40 MG tablet TAKE 1 TABLET BY MOUTH 2 TIMES DAILY BEFORE A MEAL 180 tablet 2  . PROAIR HFA 108  (90 Base) MCG/ACT inhaler Inhale 2 puffs into the lungs every 4 (four) hours as needed for shortness of breath.  3  . rosuvastatin (CRESTOR) 40 MG tablet TAKE 1 TABLET BY MOUTH EVERY DAY AT 6 P.M. (Patient taking differently: Take 40 mg by mouth daily. ) 90 tablet 3  . nitroGLYCERIN (NITROSTAT) 0.4 MG SL tablet Place 1 tablet (0.4 mg total) under the tongue every 5 (five) minutes as needed for  chest pain. 90 tablet 3   No facility-administered medications prior to visit.    No Known Allergies  Review of Systems  All other systems reviewed and are negative.      Objective:    Physical Exam Vitals and nursing note reviewed.  Constitutional:      Appearance: Normal appearance.  HENT:     Head: Normocephalic.     Right Ear: External ear normal.     Left Ear: External ear normal.     Nose: Nose normal.  Eyes:     Extraocular Movements: Extraocular movements intact.     Conjunctiva/sclera: Conjunctivae normal.     Pupils: Pupils are equal, round, and reactive to light.  Cardiovascular:     Rate and Rhythm: Normal rate.     Pulses: Normal pulses.  Pulmonary:     Effort: Pulmonary effort is normal.     Breath sounds: Normal breath sounds.  Abdominal:     General: Abdomen is flat.  Musculoskeletal:        General: No swelling or deformity.     Cervical back: Normal, normal range of motion and neck supple.     Thoracic back: Normal.     Lumbar back: Tenderness present. No swelling, edema or bony tenderness. Normal range of motion. Negative right straight leg raise test and negative left straight leg raise test.     Right lower leg: No edema.     Left lower leg: No edema.  Skin:    General: Skin is warm and dry.     Capillary Refill: Capillary refill takes less than 2 seconds.  Neurological:     General: No focal deficit present.     Mental Status: He is alert and oriented to person, place, and time.  Psychiatric:        Mood and Affect: Mood normal.        Behavior: Behavior  normal.        Thought Content: Thought content normal.        Judgment: Judgment normal.     BP 124/90 (BP Location: Left Arm, Patient Position: Sitting, Cuff Size: Large)   Pulse 73   Temp 97.6 F (36.4 C) (Temporal)   Resp 18   Wt 213 lb 3.2 oz (96.7 kg)   SpO2 97%   BMI 34.41 kg/m  Wt Readings from Last 3 Encounters:  03/02/20 213 lb 3.2 oz (96.7 kg)  02/17/20 216 lb (98 kg)  12/24/19 214 lb 9.6 oz (97.3 kg)    Health Maintenance Due  Topic Date Due  . Hepatitis C Screening  Never done  . TETANUS/TDAP  Never done    There are no preventive care reminders to display for this patient.   Lab Results  Component Value Date   TSH 0.875 12/19/2017   Lab Results  Component Value Date   WBC 5.2 10/30/2019   HGB 15.6 10/30/2019   HCT 48.0 10/30/2019   MCV 92.1 10/30/2019   PLT 234 10/30/2019   Lab Results  Component Value Date   NA 139 10/30/2019   K 3.9 10/30/2019   CO2 27 10/30/2019   GLUCOSE 86 10/30/2019   BUN 19 10/30/2019   CREATININE 0.73 10/30/2019   BILITOT 0.2 (L) 01/13/2019   ALKPHOS 61 01/13/2019   AST 21 01/13/2019   ALT 29 01/13/2019   PROT 6.6 01/13/2019   ALBUMIN 4.0 01/13/2019   CALCIUM 9.2 10/30/2019   ANIONGAP 8 10/30/2019   Lab Results  Component Value  Date   CHOL 162 06/14/2019   Lab Results  Component Value Date   HDL 48 06/14/2019   Lab Results  Component Value Date   LDLCALC 87 06/14/2019   Lab Results  Component Value Date   TRIG 136 06/14/2019   Lab Results  Component Value Date   CHOLHDL 3.4 06/14/2019   Lab Results  Component Value Date   HGBA1C 5.7 (H) 12/19/2017       Assessment & Plan:  Her symptoms are consistent with lumbar strain with sciatica to right extremity You should 1. Rest. 2. Applying heat and cold to the affected area. 3. Prescribed medicines to help relieve pain and inflammation may be needed for a short time. 4. Follow up in one week for non improved symptoms or worsening I did not  prescribe a muscle relaxer as she did not want something that could sedate you Your symptoms may continue for 2 to 4 weeks however as discussed if you would like you may follow-up if your symptoms are worsening or worsening in 1 minute and we will consider imaging.  Problem List Items Addressed This Visit    None    Visit Diagnoses    Strain of lumbar region, initial encounter    -  Primary   Relevant Medications   naproxen (NAPROSYN) 500 MG tablet   predniSONE (DELTASONE) 10 MG tablet       Meds ordered this encounter  Medications  . naproxen (NAPROSYN) 500 MG tablet    Sig: Take 1 tablet (500 mg total) by mouth 2 (two) times daily with a meal.    Dispense:  30 tablet    Refill:  0  . predniSONE (DELTASONE) 10 MG tablet    Sig: Day one take 4 tabs, day two take 3 tabs, Day three take 2 tabs, Day four take 1 tab    Dispense:  10 tablet    Refill:  0    Follow Up: as needed for non resolving or worsening symptoms.   Annie Main, FNP

## 2020-03-17 ENCOUNTER — Other Ambulatory Visit: Payer: Self-pay | Admitting: Cardiovascular Disease

## 2020-04-15 ENCOUNTER — Ambulatory Visit: Payer: Self-pay | Admitting: Diagnostic Neuroimaging

## 2020-04-15 ENCOUNTER — Encounter: Payer: Self-pay | Admitting: Diagnostic Neuroimaging

## 2020-04-15 VITALS — BP 114/75 | HR 98 | Ht 66.0 in | Wt 214.4 lb

## 2020-04-15 DIAGNOSIS — R269 Unspecified abnormalities of gait and mobility: Secondary | ICD-10-CM

## 2020-04-15 NOTE — Patient Instructions (Signed)
-   check MRI brain w/wo, B12, A1c, TSH; patient does not have insurance and is awaiting medicaid so will hold off on testing for now; he will let us know when he is ready to proceed
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
## 2020-05-02 ENCOUNTER — Other Ambulatory Visit: Payer: Self-pay | Admitting: Nurse Practitioner

## 2020-05-02 DIAGNOSIS — S39012A Strain of muscle, fascia and tendon of lower back, initial encounter: Secondary | ICD-10-CM

## 2020-05-06 ENCOUNTER — Emergency Department (HOSPITAL_COMMUNITY)
Admission: EM | Admit: 2020-05-06 | Discharge: 2020-05-07 | Disposition: A | Discharge status: Home / Self Care | Payer: PRIVATE HEALTH INSURANCE | Attending: Emergency Medicine | Admitting: Emergency Medicine

## 2020-05-06 ENCOUNTER — Other Ambulatory Visit: Payer: Self-pay

## 2020-05-06 ENCOUNTER — Encounter (HOSPITAL_COMMUNITY): Payer: Self-pay | Admitting: *Deleted

## 2020-05-06 ENCOUNTER — Emergency Department (HOSPITAL_COMMUNITY): Payer: PRIVATE HEALTH INSURANCE

## 2020-05-06 DIAGNOSIS — Z87891 Personal history of nicotine dependence: Secondary | ICD-10-CM | POA: Insufficient documentation

## 2020-05-06 DIAGNOSIS — Z955 Presence of coronary angioplasty implant and graft: Secondary | ICD-10-CM | POA: Diagnosis not present

## 2020-05-06 DIAGNOSIS — I10 Essential (primary) hypertension: Secondary | ICD-10-CM | POA: Insufficient documentation

## 2020-05-06 DIAGNOSIS — Z79899 Other long term (current) drug therapy: Secondary | ICD-10-CM | POA: Diagnosis not present

## 2020-05-06 DIAGNOSIS — R42 Dizziness and giddiness: Secondary | ICD-10-CM | POA: Insufficient documentation

## 2020-05-06 DIAGNOSIS — Z7982 Long term (current) use of aspirin: Secondary | ICD-10-CM | POA: Diagnosis not present

## 2020-05-06 DIAGNOSIS — R519 Headache, unspecified: Secondary | ICD-10-CM | POA: Insufficient documentation

## 2020-05-06 DIAGNOSIS — I252 Old myocardial infarction: Secondary | ICD-10-CM | POA: Diagnosis not present

## 2020-05-06 DIAGNOSIS — I25119 Atherosclerotic heart disease of native coronary artery with unspecified angina pectoris: Secondary | ICD-10-CM | POA: Insufficient documentation

## 2020-05-06 LAB — URINALYSIS, ROUTINE W REFLEX MICROSCOPIC
Bacteria, UA: NONE SEEN
Bilirubin Urine: NEGATIVE
Glucose, UA: NEGATIVE mg/dL
Ketones, ur: NEGATIVE mg/dL
Leukocytes,Ua: NEGATIVE
Nitrite: NEGATIVE
Protein, ur: NEGATIVE mg/dL
Specific Gravity, Urine: 1.023 (ref 1.005–1.030)
pH: 5 (ref 5.0–8.0)

## 2020-05-06 LAB — BASIC METABOLIC PANEL
Anion gap: 10 (ref 5–15)
BUN: 23 mg/dL — ABNORMAL HIGH (ref 6–20)
CO2: 24 mmol/L (ref 22–32)
Calcium: 9.3 mg/dL (ref 8.9–10.3)
Chloride: 103 mmol/L (ref 98–111)
Creatinine, Ser: 0.89 mg/dL (ref 0.61–1.24)
GFR calc Af Amer: >60 mL/min (ref >60)
GFR calc non Af Amer: >60 mL/min (ref >60)
Glucose, Bld: 116 mg/dL — ABNORMAL HIGH (ref 70–99)
Potassium: 4.1 mmol/L (ref 3.5–5.1)
Sodium: 137 mmol/L (ref 135–145)

## 2020-05-06 LAB — CBC
HCT: 46.8 % (ref 39.0–52.0)
Hemoglobin: 15.5 g/dL (ref 13.0–17.0)
MCH: 30.8 pg (ref 26.0–34.0)
MCHC: 33.1 g/dL (ref 30.0–36.0)
MCV: 92.9 fL (ref 80.0–100.0)
Platelets: 209 10*3/uL (ref 150–400)
RBC: 5.04 MIL/uL (ref 4.22–5.81)
RDW: 12.2 % (ref 11.5–15.5)
WBC: 5.5 10*3/uL (ref 4.0–10.5)
nRBC: 0 % (ref 0.0–0.2)

## 2020-05-06 MED ORDER — MECLIZINE HCL 25 MG PO TABS
25.0000 mg | ORAL_TABLET | Freq: Three times a day (TID) | ORAL | 0 refills | Status: DC | PRN
Start: 2020-05-06 — End: 2020-08-12

## 2020-05-06 MED ORDER — TRAMADOL HCL 50 MG PO TABS: 50.0000 mg | ORAL_TABLET | Freq: Four times a day (QID) | ORAL | 0 refills | Status: DC | PRN

## 2020-05-06 MED ORDER — SODIUM CHLORIDE 0.9% FLUSH: 3.0000 mL | Freq: Once | INTRAVENOUS | Status: DC

## 2020-05-06 NOTE — Discharge Instructions (Addendum)
Follow-up with your doctor.  Also follow-up with neurology call and make an appointment. Trial of the Antivert which is antidizziness medicine.  If symptoms persist MRI brain would be appropriate.  Return for any new or worse symptoms.  CT head without any acute findings.
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
## 2020-05-06 NOTE — ED Triage Notes (Signed)
Pt c/o headache and dizziness for the last couple of weeks with it getting worse today

## 2020-05-07 LAB — CBG MONITORING, ED: Glucose-Capillary: 114 mg/dL — ABNORMAL HIGH (ref 70–99)

## 2020-05-14 ENCOUNTER — Other Ambulatory Visit: Payer: Self-pay

## 2020-05-14 MED ORDER — METOPROLOL SUCCINATE ER 25 MG PO TB24: 25.0000 mg | ORAL_TABLET | Freq: Every day | ORAL | 1 refills | Status: DC

## 2020-05-15 ENCOUNTER — Other Ambulatory Visit: Payer: Self-pay

## 2020-05-15 MED ORDER — METOPROLOL SUCCINATE ER 25 MG PO TB24: 25.0000 mg | ORAL_TABLET | Freq: Every day | ORAL | 0 refills | Status: DC

## 2020-05-15 NOTE — Telephone Encounter (Signed)
Refilled toprol to [facility]

## 2020-05-19 ENCOUNTER — Other Ambulatory Visit: Payer: Self-pay

## 2020-05-19 ENCOUNTER — Ambulatory Visit (INDEPENDENT_AMBULATORY_CARE_PROVIDER_SITE_OTHER): Payer: Self-pay | Admitting: Nurse Practitioner

## 2020-05-19 VITALS — BP 120/74 | HR 61 | Temp 97.9°F | Ht 66.0 in | Wt 219.0 lb

## 2020-05-19 DIAGNOSIS — R7309 Other abnormal glucose: Secondary | ICD-10-CM

## 2020-05-19 DIAGNOSIS — H6123 Impacted cerumen, bilateral: Secondary | ICD-10-CM

## 2020-05-19 DIAGNOSIS — R42 Dizziness and giddiness: Secondary | ICD-10-CM
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
## 2020-05-19 NOTE — Patient Instructions (Addendum)
Your blood sugar was elevated in the ER visit, today we have drawn a lab to check for diabetes. You should follow a low carbohydrate/sugar diet and get at least 20 minutes of exercise daily at least 4 days per week.  Drink plenty of water.   May take non drowsy over the counter Dramamine or Meclizine however you said meclizine is making you drowsy. You had cerumen bilateral ear impaction which can be a contributor to vertigo-dizziness which was removed today, this should help to resolve your symptoms.  Please follow up as needed for new, non resolving, or worsening symptoms.   Your gate is unbalanced which puts you at a higher falls risk and you should use a cane at all times to ambulate

## 2020-05-20 LAB — HEMOGLOBIN A1C
Hgb A1c MFr Bld: 6.2 % of total Hgb — ABNORMAL HIGH (ref <5.7)
Mean Plasma Glucose: 131 (calc)
eAG (mmol/L): 7.3 (calc)

## 2020-05-20 LAB — URINALYSIS, ROUTINE W REFLEX MICROSCOPIC
Bacteria, UA: NONE SEEN /HPF
Bilirubin Urine: NEGATIVE
Glucose, UA: NEGATIVE
Hgb urine dipstick: NEGATIVE
Hyaline Cast: NONE SEEN /LPF
Ketones, ur: NEGATIVE
Leukocytes,Ua: NEGATIVE
Nitrite: NEGATIVE
Specific Gravity, Urine: 1.024 (ref 1.001–1.03)
Squamous Epithelial / HPF: NONE SEEN /HPF (ref <=5)
WBC, UA: NONE SEEN /HPF (ref 0–5)
pH: 5.5 (ref 5.0–8.0)

## 2020-05-20 NOTE — Progress Notes (Signed)
He is DM2 however diet and exercise management at this time. No UTI

## 2020-06-10 ENCOUNTER — Ambulatory Visit: Payer: PRIVATE HEALTH INSURANCE | Admitting: Nurse Practitioner

## 2020-07-10 ENCOUNTER — Other Ambulatory Visit: Payer: Self-pay | Admitting: Student

## 2020-07-10 ENCOUNTER — Other Ambulatory Visit: Payer: Self-pay | Admitting: *Deleted

## 2020-07-10 MED ORDER — EZETIMIBE 10 MG PO TABS: 10.0000 mg | ORAL_TABLET | Freq: Every day | ORAL | 1 refills | Status: DC

## 2020-08-12 ENCOUNTER — Other Ambulatory Visit: Payer: Self-pay

## 2020-08-12 ENCOUNTER — Ambulatory Visit (INDEPENDENT_AMBULATORY_CARE_PROVIDER_SITE_OTHER): Payer: Self-pay | Admitting: Physician Assistant

## 2020-08-12 ENCOUNTER — Encounter: Payer: Self-pay | Admitting: Physician Assistant

## 2020-08-12 VITALS — BP 150/101 | HR 82 | Ht 66.0 in | Wt 228.6 lb

## 2020-08-12 DIAGNOSIS — R2689 Other abnormalities of gait and mobility: Secondary | ICD-10-CM

## 2020-08-12 DIAGNOSIS — I2510 Atherosclerotic heart disease of native coronary artery without angina pectoris: Secondary | ICD-10-CM

## 2020-08-12 DIAGNOSIS — E785 Hyperlipidemia, unspecified: Secondary | ICD-10-CM

## 2020-08-12 DIAGNOSIS — I10 Essential (primary) hypertension: Secondary | ICD-10-CM

## 2020-08-12 MED ORDER — LISINOPRIL 10 MG PO TABS
10.0000 mg | ORAL_TABLET | Freq: Every day | ORAL | 3 refills | Status: DC
Start: 2020-08-12 — End: 2021-10-05
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
## 2020-08-12 NOTE — Patient Instructions (Signed)
Medication Instructions:  Your physician has recommended you make the following change in your medication:   Increase Lisinopril to 10 mg   *If you need a refill on your cardiac medications before your next appointment, please call your pharmacy*   Lab Work: Your physician recommends that you return for lab work in: Fasting   If you have labs (blood work) drawn today and your tests are completely normal, you will receive your results only by: Marland Kitchen MyChart Message (if you have MyChart) OR . A paper copy in the mail If you have any lab test that is abnormal or we need to change your treatment, we will call you to review the results.   Testing/Procedures: NONE     Follow-Up: At [facility], you and your health needs are our priority.  As part of our continuing mission to provide you with exceptional heart care, we have created designated Provider Care Teams.  These Care Teams include your primary Cardiologist (physician) and Advanced Practice Providers (APPs -  Physician Assistants and Nurse Practitioners) who all work together to provide you with the care you need, when you need it.  We recommend signing up for the patient portal called "MyChart".  Sign up information is provided on this After Visit Summary.  MyChart is used to connect with patients for Virtual Visits (Telemedicine).  Patients are able to view lab/test results, encounter notes, upcoming appointments, etc.  Non-urgent messages can be sent to your provider as well.   To learn more about what you can do with MyChart, go to [email or web address].    Your next appointment:   2 week(s)  The format for your next appointment:   In Person  Provider:   Ermalinda Barrios, PA-C   Other Instructions Thank you for choosing [facility]!

## 2020-08-17 ENCOUNTER — Other Ambulatory Visit (HOSPITAL_COMMUNITY)
Admission: RE | Admit: 2020-08-17 | Discharge: 2020-08-17 | Disposition: A | Discharge status: Home / Self Care | Payer: Self-pay | Source: Ambulatory Visit | Attending: Physician Assistant | Admitting: Physician Assistant

## 2020-08-17 ENCOUNTER — Other Ambulatory Visit: Payer: Self-pay

## 2020-08-17 ENCOUNTER — Telehealth: Payer: Self-pay | Admitting: Physician Assistant

## 2020-08-17 DIAGNOSIS — E785 Hyperlipidemia, unspecified: Secondary | ICD-10-CM | POA: Insufficient documentation

## 2020-08-17 DIAGNOSIS — I2510 Atherosclerotic heart disease of native coronary artery without angina pectoris: Secondary | ICD-10-CM | POA: Insufficient documentation

## 2020-08-17 DIAGNOSIS — I10 Essential (primary) hypertension: Secondary | ICD-10-CM | POA: Insufficient documentation

## 2020-08-17 LAB — COMPREHENSIVE METABOLIC PANEL
ALT: 59 U/L — ABNORMAL HIGH (ref 0–44)
AST: 33 U/L (ref 15–41)
Albumin: 4.4 g/dL (ref 3.5–5.0)
Alkaline Phosphatase: 63 U/L (ref 38–126)
Anion gap: 8 (ref 5–15)
BUN: 19 mg/dL (ref 6–20)
CO2: 28 mmol/L (ref 22–32)
Calcium: 9.3 mg/dL (ref 8.9–10.3)
Chloride: 101 mmol/L (ref 98–111)
Creatinine, Ser: 0.91 mg/dL (ref 0.61–1.24)
GFR, Estimated: >60 mL/min (ref >60)
Glucose, Bld: 137 mg/dL — ABNORMAL HIGH (ref 70–99)
Potassium: 4.4 mmol/L (ref 3.5–5.1)
Sodium: 137 mmol/L (ref 135–145)
Total Bilirubin: 0.8 mg/dL (ref 0.3–1.2)
Total Protein: 7.2 g/dL (ref 6.5–8.1)

## 2020-08-17 LAB — LIPID PANEL
Cholesterol: 150 mg/dL (ref 0–200)
HDL: 40 mg/dL — ABNORMAL LOW (ref >40)
LDL Cholesterol: 72 mg/dL (ref 0–99)
Total CHOL/HDL Ratio: 3.8 RATIO
Triglycerides: 189 mg/dL — ABNORMAL HIGH (ref <150)
VLDL: 38 mg/dL (ref 0–40)

## 2020-08-17 LAB — CBC
HCT: 46.4 % (ref 39.0–52.0)
Hemoglobin: 15.2 g/dL (ref 13.0–17.0)
MCH: 30.5 pg (ref 26.0–34.0)
MCHC: 32.8 g/dL (ref 30.0–36.0)
MCV: 93.2 fL (ref 80.0–100.0)
Platelets: 201 10*3/uL (ref 150–400)
RBC: 4.98 MIL/uL (ref 4.22–5.81)
RDW: 11.9 % (ref 11.5–15.5)
WBC: 4.8 10*3/uL (ref 4.0–10.5)
nRBC: 0 % (ref 0.0–0.2)

## 2020-08-17 NOTE — Telephone Encounter (Signed)
Called pt and notified of test results.

## 2020-08-17 NOTE — Telephone Encounter (Signed)
New message    Patient is returning call to [facility]
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
## 2020-08-28 ENCOUNTER — Other Ambulatory Visit: Payer: Self-pay

## 2020-08-28 MED ORDER — ISOSORBIDE MONONITRATE ER 60 MG PO TB24: 90.0000 mg | ORAL_TABLET | Freq: Every day | ORAL | 3 refills | Status: DC

## 2020-08-28 NOTE — Telephone Encounter (Signed)
[facility]

## 2020-09-01 ENCOUNTER — Ambulatory Visit (INDEPENDENT_AMBULATORY_CARE_PROVIDER_SITE_OTHER): Payer: Self-pay | Admitting: Physician Assistant

## 2020-09-01 ENCOUNTER — Other Ambulatory Visit: Payer: Self-pay

## 2020-09-01 ENCOUNTER — Encounter: Payer: Self-pay | Admitting: Physician Assistant

## 2020-09-01 VITALS — BP 104/70 | HR 90 | Ht 66.0 in | Wt 231.0 lb

## 2020-09-01 DIAGNOSIS — I2510 Atherosclerotic heart disease of native coronary artery without angina pectoris: Secondary | ICD-10-CM

## 2020-09-01 DIAGNOSIS — E785 Hyperlipidemia, unspecified: Secondary | ICD-10-CM

## 2020-09-01 DIAGNOSIS — I10 Essential (primary) hypertension: Secondary | ICD-10-CM

## 2020-09-01 DIAGNOSIS — F32A Depression, unspecified: Secondary | ICD-10-CM

## 2020-09-01 DIAGNOSIS — R2689 Other abnormalities of gait and mobility: Secondary | ICD-10-CM

## 2020-09-01 NOTE — Patient Instructions (Signed)
Medication Instructions:  Your physician recommends that you continue on your current medications as directed. Please refer to the Current Medication list given to you today.  *If you need a refill on your cardiac medications before your next appointment, please call your pharmacy*   Lab Work: NONE   If you have labs (blood work) drawn today and your tests are completely normal, you will receive your results only by: Marland Kitchen MyChart Message (if you have MyChart) OR . A paper copy in the mail If you have any lab test that is abnormal or we need to change your treatment, we will call you to review the results.   Testing/Procedures: NONE    Follow-Up: At [facility], you and your health needs are our priority.  As part of our continuing mission to provide you with exceptional heart care, we have created designated Provider Care Teams.  These Care Teams include your primary Cardiologist (physician) and Advanced Practice Providers (APPs -  Physician Assistants and Nurse Practitioners) who all work together to provide you with the care you need, when you need it.  We recommend signing up for the patient portal called "MyChart".  Sign up information is provided on this After Visit Summary.  MyChart is used to connect with patients for Virtual Visits (Telemedicine).  Patients are able to view lab/test results, encounter notes, upcoming appointments, etc.  Non-urgent messages can be sent to your provider as well.   To learn more about what you can do with MyChart, go to [email or web address].    Your next appointment:      The format for your next appointment:   In Person  Provider:   With MD    Other Instructions  Decrease Sugar intake  Follow up with Primary Care Doctor    Thank you for choosing [facility]!

## 2020-09-15 ENCOUNTER — Other Ambulatory Visit: Payer: Self-pay

## 2020-09-15 ENCOUNTER — Ambulatory Visit (INDEPENDENT_AMBULATORY_CARE_PROVIDER_SITE_OTHER): Payer: Self-pay | Admitting: Family Medicine

## 2020-09-15 ENCOUNTER — Telehealth: Payer: Self-pay

## 2020-09-15 ENCOUNTER — Encounter: Payer: Self-pay | Admitting: Family Medicine

## 2020-09-15 VITALS — BP 138/76 | HR 87 | Temp 97.6°F | Resp 15 | Ht 66.0 in | Wt 229.6 lb

## 2020-09-15 DIAGNOSIS — Z23 Encounter for immunization: Secondary | ICD-10-CM

## 2020-09-15 DIAGNOSIS — F329 Major depressive disorder, single episode, unspecified: Secondary | ICD-10-CM | POA: Insufficient documentation

## 2020-09-15 DIAGNOSIS — R7303 Prediabetes: Secondary | ICD-10-CM

## 2020-09-15 DIAGNOSIS — E785 Hyperlipidemia, unspecified: Secondary | ICD-10-CM

## 2020-09-15 DIAGNOSIS — R61 Generalized hyperhidrosis: Secondary | ICD-10-CM

## 2020-09-15 DIAGNOSIS — I2510 Atherosclerotic heart disease of native coronary artery without angina pectoris: Secondary | ICD-10-CM

## 2020-09-15 DIAGNOSIS — F321 Major depressive disorder, single episode, moderate: Secondary | ICD-10-CM

## 2020-09-15 DIAGNOSIS — F419 Anxiety disorder, unspecified: Secondary | ICD-10-CM

## 2020-09-15 DIAGNOSIS — F32A Depression, unspecified: Secondary | ICD-10-CM

## 2020-09-15 DIAGNOSIS — R7989 Other specified abnormal findings of blood chemistry: Secondary | ICD-10-CM

## 2020-09-15 MED ORDER — SERTRALINE HCL 25 MG PO TABS: 25.0000 mg | ORAL_TABLET | Freq: Every day | ORAL | 1 refills | Status: DC

## 2020-09-15 NOTE — Patient Instructions (Addendum)
Referral to therapy  Start zoloft  25mg  at bedtime F/U 4 weeks for meds

## 2020-09-15 NOTE — Telephone Encounter (Signed)
Dr.Gutterman not taking medicaid patients until sept 2022, requested we send patient to [facility], behavioral health office in [place], ref placed.

## 2020-09-15 NOTE — Assessment & Plan Note (Signed)
Per cardiology BP controlled on higher dose of lisinopril Dietary changes for elevated TG Continue current meds

## 2020-09-15 NOTE — Assessment & Plan Note (Signed)
Will start zoloft  25mg  at bedtime Will try to find a referral to behavioral health for counseling F/U 4 weeks  Unclear cause of the excessive sweating.  We will check his thyroid he also has prediabetes and will check to see if he is actually diabetic now.  He does have underlying anxiety with his depression but not likely contributing to some of the episodes as well.

## 2020-09-15 NOTE — Progress Notes (Signed)
Subjective:    Patient ID: Anthony Hensley, male    DOB: Apr 10, 1961, 59 y.o.   MRN: [id number]  Patient presents for Medication Management (pt here to follow up medications, notes he has been very warm )   Pt here to f/u medications , he is here with his wife  Previous patient of our NP who has left the practice. This is my first time meeting him Meds and history reviewed   CAD/HTB- history of NSTEMI, he has 1 stent, now on ASA, only taking imur 60mg  once a day he has not had any chest pain.  HTN recent increase in lisinipril to 10mg  to have recent labs performed which showed elevated triglycerides and isolated mild elevation in his ALT.  He is working on dietary changes.  GERD- he is on protonix twice a day   Dr. Gala Romney in [place]   Pt  wife had a letter today.  Regarding his mental health  history  He has been out of work since March of this year secondary to his medical problems.  He is trying to seek disability and is uninsured.  He has had issues with depression in the past.  His twin brother died in an accident when they were 71 years old his father died when he was 84 due to a massive heart attack.  He also went through a bad divorce himself.  In the past year he has had declining health.  And he lost his youngest son a couple months ago from [place].  He does not sleep very well he is often depressed and sits at times sad.  Because he cannot work this worsens his depression.  He has never been on any medications and he is interested in trying something to help with his mood and also interested in speaking to a therapist.  He is also concerned he is always hot and sweats a lot, will start pouring sweat from his forehead the past 2 months, no new meds , no travel, no illness Concerned it has to do with hisnerves   Review Of Systems:  GEN- denies fatigue, fever, weight loss,weakness, recent illness HEENT- denies eye drainage, change in vision, nasal discharge, CVS- denies chest pain,  palpitations RESP- denies SOB, cough, wheeze ABD- denies N/V, change in stools, abd pain GU- denies dysuria, hematuria, dribbling, incontinence MSK- denies joint pain, muscle aches, injury Neuro- denies headache, dizziness, syncope, seizure activity       Objective:    BP 138/76   Pulse 87   Temp 97.6 F (36.4 C) (Temporal)   Resp 15   Ht 5\' 6"  (1.676 m)   Wt 229 lb 9.6 oz (104.1 kg)   SpO2 97%   BMI 37.06 kg/m  GEN- NAD, alert and oriented x3 HEENT- PERRL, EOMI, non injected sclera, pink conjunctiva, MMM, oropharynx clear Neck- Supple, no thyromegaly CVS- RRR, no murmur RESP-CTAB ABD-NABS,soft,NT,ND Psych- normal affect and mood, good eye contact, very polite, so SI, no apparent hallucinations  EXT- No edema Pulses- Radial  2+        Assessment & Plan:      Problem List Items Addressed This Visit      Unprioritized   CAD (coronary artery disease)    Per cardiology BP controlled on higher dose of lisinopril Dietary changes for elevated TG Continue current meds       Relevant Orders   TSH   Hyperlipidemia - Primary   MDD (major depressive disorder)    Will start  zoloft  25mg  at bedtime Will try to find a referral to behavioral health for counseling F/U 4 weeks  Unclear cause of the excessive sweating.  We will check his thyroid he also has prediabetes and will check to see if he is actually diabetic now.  He does have underlying anxiety with his depression but not likely contributing to some of the episodes as well.      Relevant Medications   sertraline (ZOLOFT) 25 MG tablet   Other Relevant Orders   Ambulatory referral to Psychiatry    Other Visit Diagnoses    Pre-diabetes       Relevant Orders   Hemoglobin A1c   Excessive sweating       No classic B symptoms.  No weight loss.   Relevant Orders   TSH   Elevated LFTs       Relevant Orders   Comprehensive metabolic panel   Need for immunization against influenza       Relevant Orders   Flu  Vaccine QUAD 36+ mos IM (Completed)   Anxiety       Relevant Medications   sertraline (ZOLOFT) 25 MG tablet   Other Relevant Orders   Ambulatory referral to Psychiatry      Note: This dictation was prepared with Dragon dictation along with smaller phrase technology. Any transcriptional errors that result from this process are unintentional.

## 2020-09-16 ENCOUNTER — Other Ambulatory Visit: Payer: MEDICAID

## 2020-09-16 DIAGNOSIS — E785 Hyperlipidemia, unspecified: Secondary | ICD-10-CM

## 2020-09-16 DIAGNOSIS — I25119 Atherosclerotic heart disease of native coronary artery with unspecified angina pectoris: Secondary | ICD-10-CM

## 2020-09-16 DIAGNOSIS — I10 Essential (primary) hypertension: Secondary | ICD-10-CM

## 2020-09-16 LAB — COMPLETE METABOLIC PANEL WITH GFR
AG Ratio: 1.8 (calc) (ref 1.0–2.5)
ALT: 69 U/L — ABNORMAL HIGH (ref 9–46)
AST: 43 U/L — ABNORMAL HIGH (ref 10–35)
Albumin: 4.5 g/dL (ref 3.6–5.1)
Alkaline phosphatase (APISO): 79 U/L (ref 35–144)
BUN: 17 mg/dL (ref 7–25)
CO2: 31 mmol/L (ref 20–32)
Calcium: 9.5 mg/dL (ref 8.6–10.3)
Chloride: 99 mmol/L (ref 98–110)
Creat: 0.89 mg/dL (ref 0.70–1.33)
GFR, Est African American: 110 mL/min/{1.73_m2} (ref >=60)
GFR, Est Non African American: 95 mL/min/{1.73_m2} (ref >=60)
Globulin: 2.5 g/dL (calc) (ref 1.9–3.7)
Glucose, Bld: 164 mg/dL — ABNORMAL HIGH (ref 65–99)
Potassium: 4.9 mmol/L (ref 3.5–5.3)
Sodium: 137 mmol/L (ref 135–146)
Total Bilirubin: 0.5 mg/dL (ref 0.2–1.2)
Total Protein: 7 g/dL (ref 6.1–8.1)

## 2020-09-16 LAB — CBC WITH DIFFERENTIAL/PLATELET
Absolute Monocytes: 451 cells/uL (ref 200–950)
Basophils Absolute: 61 cells/uL (ref 0–200)
Basophils Relative: 1 %
Eosinophils Absolute: 98 cells/uL (ref 15–500)
Eosinophils Relative: 1.6 %
HCT: 46.6 % (ref 38.5–50.0)
Hemoglobin: 15.6 g/dL (ref 13.2–17.1)
Lymphs Abs: 2031 cells/uL (ref 850–3900)
MCH: 29.7 pg (ref 27.0–33.0)
MCHC: 33.5 g/dL (ref 32.0–36.0)
MCV: 88.6 fL (ref 80.0–100.0)
MPV: 9.9 fL (ref 7.5–12.5)
Monocytes Relative: 7.4 %
Neutro Abs: 3459 cells/uL (ref 1500–7800)
Neutrophils Relative %: 56.7 %
Platelets: 235 10*3/uL (ref 140–400)
RBC: 5.26 10*6/uL (ref 4.20–5.80)
RDW: 11.7 % (ref 11.0–15.0)
Total Lymphocyte: 33.3 %
WBC: 6.1 10*3/uL (ref 3.8–10.8)

## 2020-09-16 LAB — LIPID PANEL
Cholesterol: 131 mg/dL (ref <200)
HDL: 45 mg/dL (ref >=40)
LDL Cholesterol (Calc): 59 mg/dL (calc)
Non-HDL Cholesterol (Calc): 86 mg/dL (calc) (ref <130)
Total CHOL/HDL Ratio: 2.9 (calc) (ref <5.0)
Triglycerides: 208 mg/dL — ABNORMAL HIGH (ref <150)

## 2020-09-17 ENCOUNTER — Other Ambulatory Visit: Payer: Self-pay

## 2020-09-17 MED ORDER — ROSUVASTATIN CALCIUM 40 MG PO TABS
ORAL_TABLET | ORAL | 3 refills | Status: DC
Start: 2020-09-17 — End: 2021-10-05

## 2020-09-17 NOTE — Telephone Encounter (Signed)
This is a [facility] pt.  °

## 2020-09-18 ENCOUNTER — Telehealth: Payer: Self-pay

## 2020-09-29 ENCOUNTER — Telehealth: Payer: Self-pay

## 2020-09-29 NOTE — Telephone Encounter (Signed)
Patient returning call back to office, wasn't able to see who called him
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
## 2020-10-12 ENCOUNTER — Encounter: Payer: Self-pay | Admitting: Cardiovascular Disease

## 2020-10-12 ENCOUNTER — Ambulatory Visit (INDEPENDENT_AMBULATORY_CARE_PROVIDER_SITE_OTHER): Payer: Self-pay | Admitting: Cardiovascular Disease

## 2020-10-12 ENCOUNTER — Other Ambulatory Visit: Payer: Self-pay

## 2020-10-12 VITALS — BP 118/80 | HR 85 | Ht 66.0 in | Wt 244.0 lb

## 2020-10-12 DIAGNOSIS — I2510 Atherosclerotic heart disease of native coronary artery without angina pectoris: Secondary | ICD-10-CM

## 2020-10-12 NOTE — Patient Instructions (Signed)
Medication Instructions:  Your physician recommends that you continue on your current medications as directed. Please refer to the Current Medication list given to you today.  *If you need a refill on your cardiac medications before your next appointment, please call your pharmacy*   Lab Work: NONE   If you have labs (blood work) drawn today and your tests are completely normal, you will receive your results only by: . MyChart Message (if you have MyChart) OR . A paper copy in the mail If you have any lab test that is abnormal or we need to change your treatment, we will call you to review the results.   Testing/Procedures: NONE    Follow-Up: At [facility], you and your health needs are our priority.  As part of our continuing mission to provide you with exceptional heart care, we have created designated Provider Care Teams.  These Care Teams include your primary Cardiologist (physician) and Advanced Practice Providers (APPs -  Physician Assistants and Nurse Practitioners) who all work together to provide you with the care you need, when you need it.  We recommend signing up for the patient portal called "MyChart".  Sign up information is provided on this After Visit Summary.  MyChart is used to connect with patients for Virtual Visits (Telemedicine).  Patients are able to view lab/test results, encounter notes, upcoming appointments, etc.  Non-urgent messages can be sent to your provider as well.   To learn more about what you can do with MyChart, go to [email or web address].    Your next appointment:   1 year(s)  The format for your next appointment:   In Person  Provider:   Peter Nishan, MD   Other Instructions Thank you for choosing [facility]!    

## 2020-10-13 ENCOUNTER — Ambulatory Visit (INDEPENDENT_AMBULATORY_CARE_PROVIDER_SITE_OTHER): Payer: Self-pay | Admitting: Family Medicine

## 2020-10-13 ENCOUNTER — Encounter: Payer: Self-pay | Admitting: Family Medicine

## 2020-10-13 VITALS — BP 112/68 | HR 100 | Temp 98.2°F | Resp 14 | Ht 66.0 in | Wt 226.0 lb

## 2020-10-13 DIAGNOSIS — R7303 Prediabetes: Secondary | ICD-10-CM

## 2020-10-13 DIAGNOSIS — R61 Generalized hyperhidrosis: Secondary | ICD-10-CM

## 2020-10-13 DIAGNOSIS — F321 Major depressive disorder, single episode, moderate: Secondary | ICD-10-CM

## 2020-10-13 DIAGNOSIS — G4700 Insomnia, unspecified: Secondary | ICD-10-CM

## 2020-10-13 MED ORDER — SERTRALINE HCL 50 MG PO TABS: 50.0000 mg | ORAL_TABLET | Freq: Every day | ORAL | 2 refills | Status: DC

## 2020-10-13 NOTE — Progress Notes (Signed)
   Subjective:    Patient ID: Anthony Hensley, male    DOB: 01-13-1961, 60 y.o.   MRN: [id number]  Patient presents for Follow-up (Is not fasting) and Excessive Sweating  Pt here to f/u medications  He has multiple morbidities., he is here today to follow up the zoloft.  At last visit he was started on 25 mg of Zoloft for anxiety depression.  He was also referred to behavioral health but states that he has got in contact with them.  He still does not have health insurance.  He has not seen much difference with the Zoloft.  Does not sleep very well just feels nervous.  He still concerned that he continues to sweat all the time.  He was concerned about the lisinopril causing it however he has been on this prior to having the symptoms.  He also had a recent visit with his cardiologist they not liking getting his medications as he has been stable. He did have labs drawn a few weeks ago however A1c and TSH were not done.  A1C- last  6.2% in August and due for TSH    Review Of Systems:  GEN- denies fatigue, fever, weight loss,weakness, recent illness HEENT- denies eye drainage, change in vision, nasal discharge, CVS- denies chest pain, palpitations RESP- denies SOB, cough, wheeze ABD- denies N/V, change in stools, abd pain GU- denies dysuria, hematuria, dribbling, incontinence MSK- denies joint pain, muscle aches, injury Neuro- denies headache, dizziness, syncope, seizure activity       Objective:    BP 112/68   Pulse 100   Temp 98.2 F (36.8 C) (Temporal)   Resp 14   Ht 5\' 6"  (1.676 m)   Wt 226 lb (102.5 kg)   SpO2 97%   BMI 36.48 kg/m  GEN- NAD, alert and oriented x3 CVS- RRR, no murmur RESP-CTAB Psych normal affect and mood EXT- No edema Pulses- Radial, DP- 2+        Assessment & Plan:      Problem List Items Addressed This Visit      Unprioritized   MDD (major depressive disorder) - Primary    Major depressive symptoms with his anxiety and insomnia.  He has had the  sweating episodes for quite some time still unclear cause of it.  We will go ahead and draw TSH and A1c today.  I do not think her lisinopril is the culprit.  We will increase his Zoloft to 50 mg at bedtime. Recommend he reach out for counseling he will think about it       Relevant Medications   sertraline (ZOLOFT) 50 MG tablet    Other Visit Diagnoses    Insomnia, unspecified type       Pre-diabetes       Relevant Orders   TSH   Hemoglobin A1c   Excessive sweating       Relevant Orders   TSH      Note: This dictation was prepared with Dragon dictation along with smaller phrase technology. Any transcriptional errors that result from this process are unintentional.

## 2020-10-13 NOTE — Assessment & Plan Note (Signed)
Major depressive symptoms with his anxiety and insomnia.  He has had the sweating episodes for quite some time still unclear cause of it.  We will go ahead and draw TSH and A1c today.  I do not think her lisinopril is the culprit.  We will increase his Zoloft to 50 mg at bedtime. Recommend he reach out for counseling he will think about it

## 2020-10-13 NOTE — Patient Instructions (Addendum)
Increase zoloft to 50mg - take 1 hour before bedtime We will call with blood sugar and thyroid lab F/U 3 months

## 2020-10-14 LAB — HEMOGLOBIN A1C
Hgb A1c MFr Bld: 8.4 % of total Hgb — ABNORMAL HIGH (ref <5.7)
Mean Plasma Glucose: 194 mg/dL
eAG (mmol/L): 10.8 mmol/L

## 2020-10-14 LAB — TSH: TSH: 0.9 mIU/L (ref 0.40–4.50)

## 2020-10-23 ENCOUNTER — Encounter: Payer: Self-pay | Admitting: *Deleted

## 2020-10-23 MED ORDER — METFORMIN HCL 500 MG PO TABS: 500.0000 mg | ORAL_TABLET | Freq: Two times a day (BID) | ORAL | 3 refills | Status: DC

## 2020-11-11 ENCOUNTER — Encounter (HOSPITAL_COMMUNITY): Payer: Self-pay

## 2020-11-11 ENCOUNTER — Other Ambulatory Visit: Payer: Self-pay

## 2020-11-11 DIAGNOSIS — E669 Obesity, unspecified: Secondary | ICD-10-CM | POA: Diagnosis present

## 2020-11-11 DIAGNOSIS — E1165 Type 2 diabetes mellitus with hyperglycemia: Secondary | ICD-10-CM | POA: Diagnosis present

## 2020-11-11 DIAGNOSIS — Z79899 Other long term (current) drug therapy: Secondary | ICD-10-CM

## 2020-11-11 DIAGNOSIS — U071 COVID-19: Principal | ICD-10-CM | POA: Diagnosis present

## 2020-11-11 DIAGNOSIS — E1159 Type 2 diabetes mellitus with other circulatory complications: Secondary | ICD-10-CM | POA: Diagnosis present

## 2020-11-11 DIAGNOSIS — Z87891 Personal history of nicotine dependence: Secondary | ICD-10-CM

## 2020-11-11 DIAGNOSIS — Z955 Presence of coronary angioplasty implant and graft: Secondary | ICD-10-CM

## 2020-11-11 DIAGNOSIS — M19012 Primary osteoarthritis, left shoulder: Secondary | ICD-10-CM | POA: Diagnosis present

## 2020-11-11 DIAGNOSIS — E782 Mixed hyperlipidemia: Secondary | ICD-10-CM | POA: Diagnosis present

## 2020-11-11 DIAGNOSIS — Z7984 Long term (current) use of oral hypoglycemic drugs: Secondary | ICD-10-CM

## 2020-11-11 DIAGNOSIS — R0602 Shortness of breath: Secondary | ICD-10-CM | POA: Diagnosis not present

## 2020-11-11 DIAGNOSIS — J1282 Pneumonia due to coronavirus disease 2019: Secondary | ICD-10-CM | POA: Diagnosis present

## 2020-11-11 DIAGNOSIS — F32A Depression, unspecified: Secondary | ICD-10-CM | POA: Diagnosis present

## 2020-11-11 DIAGNOSIS — Z8249 Family history of ischemic heart disease and other diseases of the circulatory system: Secondary | ICD-10-CM

## 2020-11-11 DIAGNOSIS — K219 Gastro-esophageal reflux disease without esophagitis: Secondary | ICD-10-CM | POA: Diagnosis present

## 2020-11-11 DIAGNOSIS — Z6839 Body mass index (BMI) 39.0-39.9, adult: Secondary | ICD-10-CM

## 2020-11-11 DIAGNOSIS — I10 Essential (primary) hypertension: Secondary | ICD-10-CM | POA: Diagnosis present

## 2020-11-11 DIAGNOSIS — Z7982 Long term (current) use of aspirin: Secondary | ICD-10-CM

## 2020-11-11 DIAGNOSIS — I252 Old myocardial infarction: Secondary | ICD-10-CM

## 2020-11-11 DIAGNOSIS — I25110 Atherosclerotic heart disease of native coronary artery with unstable angina pectoris: Secondary | ICD-10-CM | POA: Diagnosis present

## 2020-11-11 DIAGNOSIS — M47812 Spondylosis without myelopathy or radiculopathy, cervical region: Secondary | ICD-10-CM | POA: Diagnosis present

## 2020-11-11 DIAGNOSIS — F419 Anxiety disorder, unspecified: Secondary | ICD-10-CM | POA: Diagnosis present

## 2020-11-11 DIAGNOSIS — Z23 Encounter for immunization: Secondary | ICD-10-CM

## 2020-11-11 DIAGNOSIS — M19011 Primary osteoarthritis, right shoulder: Secondary | ICD-10-CM | POA: Diagnosis present

## 2020-11-11 NOTE — ED Triage Notes (Addendum)
Pt to er, pt states that he and his wife took an at home covid test and it came back positive, states that he is here for cough, sob, chest pain and fatigue.  Pt states that he was vaccinated and has gotten the booster.

## 2020-11-12 ENCOUNTER — Inpatient Hospital Stay (HOSPITAL_COMMUNITY)
Admission: EM | Admit: 2020-11-12 | Discharge: 2020-11-13 | DRG: 177 | Disposition: A | Discharge status: Home / Self Care | Payer: HRSA Program | Attending: Internal Medicine | Admitting: Internal Medicine

## 2020-11-12 ENCOUNTER — Emergency Department (HOSPITAL_COMMUNITY): Payer: HRSA Program

## 2020-11-12 ENCOUNTER — Other Ambulatory Visit: Payer: Self-pay

## 2020-11-12 ENCOUNTER — Encounter (HOSPITAL_COMMUNITY): Payer: Self-pay | Admitting: Family Medicine

## 2020-11-12 DIAGNOSIS — R0902 Hypoxemia: Secondary | ICD-10-CM

## 2020-11-12 DIAGNOSIS — E1169 Type 2 diabetes mellitus with other specified complication: Secondary | ICD-10-CM

## 2020-11-12 DIAGNOSIS — I10 Essential (primary) hypertension: Secondary | ICD-10-CM | POA: Diagnosis present

## 2020-11-12 DIAGNOSIS — E669 Obesity, unspecified: Secondary | ICD-10-CM | POA: Diagnosis present

## 2020-11-12 DIAGNOSIS — I2510 Atherosclerotic heart disease of native coronary artery without angina pectoris: Secondary | ICD-10-CM | POA: Diagnosis present

## 2020-11-12 DIAGNOSIS — M19012 Primary osteoarthritis, left shoulder: Secondary | ICD-10-CM | POA: Diagnosis present

## 2020-11-12 DIAGNOSIS — F32A Depression, unspecified: Secondary | ICD-10-CM | POA: Diagnosis present

## 2020-11-12 DIAGNOSIS — U071 COVID-19: Secondary | ICD-10-CM

## 2020-11-12 DIAGNOSIS — Z87891 Personal history of nicotine dependence: Secondary | ICD-10-CM | POA: Diagnosis not present

## 2020-11-12 DIAGNOSIS — Z79899 Other long term (current) drug therapy: Secondary | ICD-10-CM | POA: Diagnosis not present

## 2020-11-12 DIAGNOSIS — K219 Gastro-esophageal reflux disease without esophagitis: Secondary | ICD-10-CM | POA: Diagnosis present

## 2020-11-12 DIAGNOSIS — Z23 Encounter for immunization: Secondary | ICD-10-CM | POA: Diagnosis not present

## 2020-11-12 DIAGNOSIS — Z955 Presence of coronary angioplasty implant and graft: Secondary | ICD-10-CM | POA: Diagnosis not present

## 2020-11-12 DIAGNOSIS — J1282 Pneumonia due to coronavirus disease 2019: Secondary | ICD-10-CM | POA: Diagnosis present

## 2020-11-12 DIAGNOSIS — E1165 Type 2 diabetes mellitus with hyperglycemia: Secondary | ICD-10-CM | POA: Diagnosis present

## 2020-11-12 DIAGNOSIS — I152 Hypertension secondary to endocrine disorders: Secondary | ICD-10-CM

## 2020-11-12 DIAGNOSIS — Z7984 Long term (current) use of oral hypoglycemic drugs: Secondary | ICD-10-CM | POA: Diagnosis not present

## 2020-11-12 DIAGNOSIS — M19011 Primary osteoarthritis, right shoulder: Secondary | ICD-10-CM | POA: Diagnosis present

## 2020-11-12 DIAGNOSIS — E1159 Type 2 diabetes mellitus with other circulatory complications: Secondary | ICD-10-CM | POA: Diagnosis present

## 2020-11-12 DIAGNOSIS — Z6839 Body mass index (BMI) 39.0-39.9, adult: Secondary | ICD-10-CM | POA: Diagnosis not present

## 2020-11-12 DIAGNOSIS — E782 Mixed hyperlipidemia: Secondary | ICD-10-CM | POA: Diagnosis present

## 2020-11-12 DIAGNOSIS — Z7982 Long term (current) use of aspirin: Secondary | ICD-10-CM | POA: Diagnosis not present

## 2020-11-12 DIAGNOSIS — I25110 Atherosclerotic heart disease of native coronary artery with unstable angina pectoris: Secondary | ICD-10-CM | POA: Diagnosis present

## 2020-11-12 DIAGNOSIS — Z8249 Family history of ischemic heart disease and other diseases of the circulatory system: Secondary | ICD-10-CM | POA: Diagnosis not present

## 2020-11-12 DIAGNOSIS — I200 Unstable angina: Secondary | ICD-10-CM

## 2020-11-12 DIAGNOSIS — M47812 Spondylosis without myelopathy or radiculopathy, cervical region: Secondary | ICD-10-CM | POA: Diagnosis present

## 2020-11-12 DIAGNOSIS — E7800 Pure hypercholesterolemia, unspecified: Secondary | ICD-10-CM

## 2020-11-12 DIAGNOSIS — R0602 Shortness of breath: Secondary | ICD-10-CM | POA: Diagnosis present

## 2020-11-12 DIAGNOSIS — F419 Anxiety disorder, unspecified: Secondary | ICD-10-CM | POA: Diagnosis present

## 2020-11-12 DIAGNOSIS — I252 Old myocardial infarction: Secondary | ICD-10-CM | POA: Diagnosis not present

## 2020-11-12 HISTORY — DX: COVID-19: U07.1

## 2020-11-12 HISTORY — DX: Pneumonia due to coronavirus disease 2019: J12.82

## 2020-11-12 LAB — CBC WITH DIFFERENTIAL/PLATELET
Abs Immature Granulocytes: 0.02 10*3/uL (ref 0.00–0.07)
Basophils Absolute: 0 10*3/uL (ref 0.0–0.1)
Basophils Relative: 1 %
Eosinophils Absolute: 0 10*3/uL (ref 0.0–0.5)
Eosinophils Relative: 0 %
HCT: 45.8 % (ref 39.0–52.0)
Hemoglobin: 15.1 g/dL (ref 13.0–17.0)
Immature Granulocytes: 0 %
Lymphocytes Relative: 19 %
Lymphs Abs: 1.1 10*3/uL (ref 0.7–4.0)
MCH: 29.8 pg (ref 26.0–34.0)
MCHC: 33 g/dL (ref 30.0–36.0)
MCV: 90.3 fL (ref 80.0–100.0)
Monocytes Absolute: 0.5 10*3/uL (ref 0.1–1.0)
Monocytes Relative: 9 %
Neutro Abs: 4.1 10*3/uL (ref 1.7–7.7)
Neutrophils Relative %: 71 %
Platelets: 195 10*3/uL (ref 150–400)
RBC: 5.07 MIL/uL (ref 4.22–5.81)
RDW: 11.9 % (ref 11.5–15.5)
WBC: 5.8 10*3/uL (ref 4.0–10.5)
nRBC: 0 % (ref 0.0–0.2)

## 2020-11-12 LAB — C-REACTIVE PROTEIN: CRP: 8 mg/dL — ABNORMAL HIGH (ref <1.0)

## 2020-11-12 LAB — SARS CORONAVIRUS 2 BY RT PCR (HOSPITAL ORDER, PERFORMED IN ~~LOC~~ HOSPITAL LAB): SARS Coronavirus 2: POSITIVE — AB

## 2020-11-12 LAB — CBG MONITORING, ED
Glucose-Capillary: 188 mg/dL — ABNORMAL HIGH (ref 70–99)
Glucose-Capillary: 222 mg/dL — ABNORMAL HIGH (ref 70–99)
Glucose-Capillary: 292 mg/dL — ABNORMAL HIGH (ref 70–99)

## 2020-11-12 LAB — COMPREHENSIVE METABOLIC PANEL
ALT: 75 U/L — ABNORMAL HIGH (ref 0–44)
AST: 48 U/L — ABNORMAL HIGH (ref 15–41)
Albumin: 4.2 g/dL (ref 3.5–5.0)
Alkaline Phosphatase: 75 U/L (ref 38–126)
Anion gap: 9 (ref 5–15)
BUN: 19 mg/dL (ref 6–20)
CO2: 23 mmol/L (ref 22–32)
Calcium: 9 mg/dL (ref 8.9–10.3)
Chloride: 101 mmol/L (ref 98–111)
Creatinine, Ser: 0.91 mg/dL (ref 0.61–1.24)
GFR, Estimated: >60 mL/min (ref >60)
Glucose, Bld: 175 mg/dL — ABNORMAL HIGH (ref 70–99)
Potassium: 4 mmol/L (ref 3.5–5.1)
Sodium: 133 mmol/L — ABNORMAL LOW (ref 135–145)
Total Bilirubin: 0.8 mg/dL (ref 0.3–1.2)
Total Protein: 7.7 g/dL (ref 6.5–8.1)

## 2020-11-12 LAB — LACTIC ACID, PLASMA: Lactic Acid, Venous: 1.1 mmol/L (ref 0.5–1.9)

## 2020-11-12 LAB — LACTATE DEHYDROGENASE: LDH: 138 U/L (ref 98–192)

## 2020-11-12 LAB — TRIGLYCERIDES: Triglycerides: 103 mg/dL (ref <150)

## 2020-11-12 LAB — D-DIMER, QUANTITATIVE: D-Dimer, Quant: <0.27 ug/mL-FEU (ref 0.00–0.50)

## 2020-11-12 LAB — HIV ANTIBODY (ROUTINE TESTING W REFLEX): HIV Screen 4th Generation wRfx: NONREACTIVE

## 2020-11-12 LAB — FIBRINOGEN: Fibrinogen: 601 mg/dL — ABNORMAL HIGH (ref 210–475)

## 2020-11-12 LAB — FERRITIN: Ferritin: 238 ng/mL (ref 24–336)

## 2020-11-12 LAB — GLUCOSE, CAPILLARY: Glucose-Capillary: 172 mg/dL — ABNORMAL HIGH (ref 70–99)

## 2020-11-12 LAB — PROCALCITONIN: Procalcitonin: <0.1 ng/mL

## 2020-11-12 MED ORDER — EZETIMIBE 10 MG PO TABS
10.0000 mg | ORAL_TABLET | Freq: Every day | ORAL | Status: DC
  Administered 2020-11-13: 10 mg via ORAL
  Filled 2020-11-12 (×2): qty 1

## 2020-11-12 MED ORDER — PNEUMOCOCCAL VAC POLYVALENT 25 MCG/0.5ML IJ INJ
0.5000 mL | INJECTION | INTRAMUSCULAR | Status: AC
  Administered 2020-11-13: 0.5 mL via INTRAMUSCULAR
  Filled 2020-11-12: qty 0.5

## 2020-11-12 MED ORDER — ALBUTEROL SULFATE HFA 108 (90 BASE) MCG/ACT IN AERS: 2.0000 | INHALATION_SPRAY | RESPIRATORY_TRACT | Status: DC | PRN

## 2020-11-12 MED ORDER — INSULIN GLARGINE 100 UNIT/ML ~~LOC~~ SOLN
15.0000 [IU] | Freq: Every day | SUBCUTANEOUS | Status: DC
  Administered 2020-11-12 – 2020-11-13 (×2): 15 [IU] via SUBCUTANEOUS
  Filled 2020-11-12 (×5): qty 0.15

## 2020-11-12 MED ORDER — LISINOPRIL 10 MG PO TABS
10.0000 mg | ORAL_TABLET | Freq: Every day | ORAL | Status: DC
  Administered 2020-11-13: 10 mg via ORAL
  Filled 2020-11-12: qty 1

## 2020-11-12 MED ORDER — SODIUM CHLORIDE 0.9 % IV SOLN
100.0000 mg | Freq: Every day | INTRAVENOUS | Status: DC
  Administered 2020-11-13: 100 mg via INTRAVENOUS
  Filled 2020-11-12: qty 20

## 2020-11-12 MED ORDER — SODIUM CHLORIDE 0.9 % IV SOLN: INTRAVENOUS | Status: DC

## 2020-11-12 MED ORDER — SODIUM CHLORIDE 0.9 % IV SOLN
100.0000 mg | INTRAVENOUS | Status: AC
  Administered 2020-11-12 (×2): 100 mg via INTRAVENOUS
  Filled 2020-11-12 (×2): qty 20

## 2020-11-12 MED ORDER — METOPROLOL SUCCINATE ER 25 MG PO TB24
25.0000 mg | ORAL_TABLET | Freq: Every day | ORAL | Status: DC
  Administered 2020-11-12 – 2020-11-13 (×2): 25 mg via ORAL
  Filled 2020-11-12 (×2): qty 1

## 2020-11-12 MED ORDER — TRAZODONE HCL 50 MG PO TABS: 50.0000 mg | ORAL_TABLET | Freq: Every evening | ORAL | Status: DC | PRN

## 2020-11-12 MED ORDER — PANTOPRAZOLE SODIUM 40 MG PO TBEC
40.0000 mg | DELAYED_RELEASE_TABLET | Freq: Two times a day (BID) | ORAL | Status: DC
  Administered 2020-11-12 – 2020-11-13 (×3): 40 mg via ORAL
  Filled 2020-11-12 (×3): qty 1

## 2020-11-12 MED ORDER — ONDANSETRON HCL 4 MG/2ML IJ SOLN: 4.0000 mg | Freq: Four times a day (QID) | INTRAMUSCULAR | Status: DC | PRN

## 2020-11-12 MED ORDER — INSULIN ASPART 100 UNIT/ML ~~LOC~~ SOLN: 0.0000 [IU] | Freq: Every day | SUBCUTANEOUS | Status: DC

## 2020-11-12 MED ORDER — OXYCODONE HCL 5 MG PO TABS
5.0000 mg | ORAL_TABLET | ORAL | Status: DC | PRN
  Administered 2020-11-12 (×2): 5 mg via ORAL
  Filled 2020-11-12 (×2): qty 1

## 2020-11-12 MED ORDER — SERTRALINE HCL 50 MG PO TABS
50.0000 mg | ORAL_TABLET | Freq: Every day | ORAL | Status: DC
  Administered 2020-11-12: 50 mg via ORAL
  Filled 2020-11-12: qty 1

## 2020-11-12 MED ORDER — ZINC SULFATE 220 (50 ZN) MG PO CAPS
220.0000 mg | ORAL_CAPSULE | Freq: Every day | ORAL | Status: DC
  Administered 2020-11-12 – 2020-11-13 (×2): 220 mg via ORAL
  Filled 2020-11-12 (×2): qty 1

## 2020-11-12 MED ORDER — INSULIN ASPART 100 UNIT/ML ~~LOC~~ SOLN
6.0000 [IU] | Freq: Three times a day (TID) | SUBCUTANEOUS | Status: DC
  Administered 2020-11-12 – 2020-11-13 (×6): 6 [IU] via SUBCUTANEOUS
  Filled 2020-11-12 (×3): qty 1

## 2020-11-12 MED ORDER — ROSUVASTATIN CALCIUM 20 MG PO TABS
40.0000 mg | ORAL_TABLET | Freq: Every evening | ORAL | Status: DC
  Administered 2020-11-12 – 2020-11-13 (×2): 40 mg via ORAL
  Filled 2020-11-12 (×2): qty 2

## 2020-11-12 MED ORDER — ACETAMINOPHEN 325 MG PO TABS: 650.0000 mg | ORAL_TABLET | Freq: Four times a day (QID) | ORAL | Status: DC | PRN

## 2020-11-12 MED ORDER — METHYLPREDNISOLONE SODIUM SUCC 125 MG IJ SOLR
80.0000 mg | Freq: Two times a day (BID) | INTRAMUSCULAR | Status: DC
  Administered 2020-11-12 – 2020-11-13 (×4): 80 mg via INTRAVENOUS
  Filled 2020-11-12 (×4): qty 2

## 2020-11-12 MED ORDER — GUAIFENESIN-DM 100-10 MG/5ML PO SYRP
10.0000 mL | ORAL_SOLUTION | ORAL | Status: DC | PRN
  Administered 2020-11-12 (×2): 10 mL via ORAL
  Filled 2020-11-12 (×2): qty 10

## 2020-11-12 MED ORDER — IPRATROPIUM-ALBUTEROL 20-100 MCG/ACT IN AERS
1.0000 | INHALATION_SPRAY | Freq: Four times a day (QID) | RESPIRATORY_TRACT | Status: DC
  Administered 2020-11-12 (×3): 1 via RESPIRATORY_TRACT
  Filled 2020-11-12: qty 4

## 2020-11-12 MED ORDER — ASCORBIC ACID 500 MG PO TABS
500.0000 mg | ORAL_TABLET | Freq: Every day | ORAL | Status: DC
  Administered 2020-11-12 – 2020-11-13 (×2): 500 mg via ORAL
  Filled 2020-11-12 (×2): qty 1

## 2020-11-12 MED ORDER — INSULIN ASPART 100 UNIT/ML ~~LOC~~ SOLN
0.0000 [IU] | Freq: Three times a day (TID) | SUBCUTANEOUS | Status: DC
  Administered 2020-11-12: 11 [IU] via SUBCUTANEOUS
  Administered 2020-11-12: 4 [IU] via SUBCUTANEOUS
  Administered 2020-11-12: 7 [IU] via SUBCUTANEOUS
  Administered 2020-11-13: 11 [IU] via SUBCUTANEOUS
  Administered 2020-11-13: 7 [IU] via SUBCUTANEOUS
  Administered 2020-11-13: 4 [IU] via SUBCUTANEOUS
  Filled 2020-11-12 (×3): qty 1

## 2020-11-12 MED ORDER — HYDROCOD POLST-CPM POLST ER 10-8 MG/5ML PO SUER: 5.0000 mL | Freq: Two times a day (BID) | ORAL | Status: DC | PRN

## 2020-11-12 MED ORDER — ONDANSETRON HCL 4 MG PO TABS: 4.0000 mg | ORAL_TABLET | Freq: Four times a day (QID) | ORAL | Status: DC | PRN

## 2020-11-12 MED ORDER — ASPIRIN EC 81 MG PO TBEC
81.0000 mg | DELAYED_RELEASE_TABLET | Freq: Every day | ORAL | Status: DC
  Administered 2020-11-13: 81 mg via ORAL
  Filled 2020-11-12: qty 1

## 2020-11-12 MED ORDER — POLYETHYLENE GLYCOL 3350 17 G PO PACK: 17.0000 g | PACK | Freq: Every day | ORAL | Status: DC | PRN

## 2020-11-12 MED ORDER — ENOXAPARIN SODIUM 60 MG/0.6ML ~~LOC~~ SOLN
60.0000 mg | SUBCUTANEOUS | Status: DC
  Administered 2020-11-12 – 2020-11-13 (×2): 60 mg via SUBCUTANEOUS
  Filled 2020-11-12 (×2): qty 0.6

## 2020-11-12 MED ORDER — ISOSORBIDE MONONITRATE ER 60 MG PO TB24
90.0000 mg | ORAL_TABLET | Freq: Every day | ORAL | Status: DC
  Administered 2020-11-13: 90 mg via ORAL
  Filled 2020-11-12: qty 2

## 2020-11-12 MED ORDER — IPRATROPIUM-ALBUTEROL 20-100 MCG/ACT IN AERS
1.0000 | INHALATION_SPRAY | Freq: Two times a day (BID) | RESPIRATORY_TRACT | Status: DC
  Administered 2020-11-13: 1 via RESPIRATORY_TRACT

## 2020-11-12 NOTE — ED Notes (Signed)
Respiratory therapy reminded of 815am order.
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
## 2020-11-12 NOTE — Progress Notes (Signed)
PHARMACIST - PHYSICIAN COMMUNICATION  CONCERNING:  Enoxaparin (Lovenox) for DVT Prophylaxis    RECOMMENDATION: Patient was prescribed enoxaprin 40mg  q24 hours for VTE prophylaxis.   Filed Weights   11/11/20 2306  Weight: 111.1 kg (245 lb)    Body mass index is 39.54 kg/m.  Estimated Creatinine Clearance: 104.8 mL/min (by C-G formula based on SCr of 0.91 mg/dL).  Based on [place] patient is candidate for enoxaparin 0.5mg /kg TBW SQ every 24 hours based on BMI being >30.  DESCRIPTION: Pharmacy has adjusted enoxaparin dose per [facility] policy.  Patient is now receiving enoxaparin 60 mg every 24 hours    Ena Dawley, PharmD Clinical Pharmacist  11/12/2020 8:08 AM
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
## 2020-11-13 DIAGNOSIS — J1282 Pneumonia due to coronavirus disease 2019: Secondary | ICD-10-CM

## 2020-11-13 DIAGNOSIS — I2510 Atherosclerotic heart disease of native coronary artery without angina pectoris: Secondary | ICD-10-CM

## 2020-11-13 DIAGNOSIS — R0902 Hypoxemia: Secondary | ICD-10-CM

## 2020-11-13 DIAGNOSIS — I10 Essential (primary) hypertension: Secondary | ICD-10-CM

## 2020-11-13 DIAGNOSIS — E1169 Type 2 diabetes mellitus with other specified complication: Secondary | ICD-10-CM

## 2020-11-13 DIAGNOSIS — K219 Gastro-esophageal reflux disease without esophagitis: Secondary | ICD-10-CM

## 2020-11-13 DIAGNOSIS — E1159 Type 2 diabetes mellitus with other circulatory complications: Secondary | ICD-10-CM

## 2020-11-13 DIAGNOSIS — E7800 Pure hypercholesterolemia, unspecified: Secondary | ICD-10-CM

## 2020-11-13 DIAGNOSIS — U071 COVID-19: Principal | ICD-10-CM

## 2020-11-13 DIAGNOSIS — I152 Hypertension secondary to endocrine disorders: Secondary | ICD-10-CM

## 2020-11-13 DIAGNOSIS — E669 Obesity, unspecified: Secondary | ICD-10-CM

## 2020-11-13 LAB — COMPREHENSIVE METABOLIC PANEL
ALT: 80 U/L — ABNORMAL HIGH (ref 0–44)
AST: 53 U/L — ABNORMAL HIGH (ref 15–41)
Albumin: 4 g/dL (ref 3.5–5.0)
Alkaline Phosphatase: 70 U/L (ref 38–126)
Anion gap: 9 (ref 5–15)
BUN: 26 mg/dL — ABNORMAL HIGH (ref 6–20)
CO2: 25 mmol/L (ref 22–32)
Calcium: 9.5 mg/dL (ref 8.9–10.3)
Chloride: 101 mmol/L (ref 98–111)
Creatinine, Ser: 0.92 mg/dL (ref 0.61–1.24)
GFR, Estimated: >60 mL/min (ref >60)
Glucose, Bld: 218 mg/dL — ABNORMAL HIGH (ref 70–99)
Potassium: 4.6 mmol/L (ref 3.5–5.1)
Sodium: 135 mmol/L (ref 135–145)
Total Bilirubin: 0.6 mg/dL (ref 0.3–1.2)
Total Protein: 7.8 g/dL (ref 6.5–8.1)

## 2020-11-13 LAB — CBC WITH DIFFERENTIAL/PLATELET
Abs Immature Granulocytes: 0.06 10*3/uL (ref 0.00–0.07)
Basophils Absolute: 0 10*3/uL (ref 0.0–0.1)
Basophils Relative: 0 %
Eosinophils Absolute: 0.2 10*3/uL (ref 0.0–0.5)
Eosinophils Relative: 4 %
HCT: 48.4 % (ref 39.0–52.0)
Hemoglobin: 15.9 g/dL (ref 13.0–17.0)
Immature Granulocytes: 1 %
Lymphocytes Relative: 11 %
Lymphs Abs: 0.8 10*3/uL (ref 0.7–4.0)
MCH: 30.5 pg (ref 26.0–34.0)
MCHC: 32.9 g/dL (ref 30.0–36.0)
MCV: 92.7 fL (ref 80.0–100.0)
Monocytes Absolute: 0.2 10*3/uL (ref 0.1–1.0)
Monocytes Relative: 2 %
Neutro Abs: 5.5 10*3/uL (ref 1.7–7.7)
Neutrophils Relative %: 82 %
Platelets: 187 10*3/uL (ref 150–400)
RBC: 5.22 MIL/uL (ref 4.22–5.81)
RDW: 11.9 % (ref 11.5–15.5)
WBC: 6.8 10*3/uL (ref 4.0–10.5)
nRBC: 0 % (ref 0.0–0.2)

## 2020-11-13 LAB — D-DIMER, QUANTITATIVE: D-Dimer, Quant: 0.29 ug/mL-FEU (ref 0.00–0.50)

## 2020-11-13 LAB — FERRITIN: Ferritin: 357 ng/mL — ABNORMAL HIGH (ref 24–336)

## 2020-11-13 LAB — GLUCOSE, CAPILLARY
Glucose-Capillary: 216 mg/dL — ABNORMAL HIGH (ref 70–99)
Glucose-Capillary: 199 mg/dL — ABNORMAL HIGH (ref 70–99)
Glucose-Capillary: 273 mg/dL — ABNORMAL HIGH (ref 70–99)
Glucose-Capillary: 255 mg/dL — ABNORMAL HIGH (ref 70–99)

## 2020-11-13 LAB — MAGNESIUM: Magnesium: 1.9 mg/dL (ref 1.7–2.4)

## 2020-11-13 LAB — C-REACTIVE PROTEIN: CRP: 5.7 mg/dL — ABNORMAL HIGH (ref <1.0)

## 2020-11-13 LAB — PHOSPHORUS: Phosphorus: 2.9 mg/dL (ref 2.5–4.6)

## 2020-11-13 MED ORDER — ASCORBIC ACID 500 MG PO TABS: 500.0000 mg | ORAL_TABLET | Freq: Every day | ORAL | 1 refills | Status: DC

## 2020-11-13 MED ORDER — ZINC SULFATE 220 (50 ZN) MG PO CAPS: 220.0000 mg | ORAL_CAPSULE | Freq: Every day | ORAL | 1 refills | Status: DC

## 2020-11-13 MED ORDER — IPRATROPIUM-ALBUTEROL 20-100 MCG/ACT IN AERS: 1.0000 | INHALATION_SPRAY | Freq: Four times a day (QID) | RESPIRATORY_TRACT | 1 refills | Status: DC | PRN

## 2020-11-13 MED ORDER — DEXAMETHASONE 6 MG PO TABS: 6.0000 mg | ORAL_TABLET | Freq: Every day | ORAL | 0 refills | Status: AC

## 2020-11-13 MED ORDER — GUAIFENESIN-DM 100-10 MG/5ML PO SYRP: 10.0000 mL | ORAL_SOLUTION | ORAL | 0 refills | Status: DC | PRN

## 2020-11-13 NOTE — Progress Notes (Signed)
Patient scheduled for outpatient Remdesivir infusions at 2:30pm on Saturday 2/5, Monday 2/7 and Tuesday 2/8 at [facility]. Please inform the patient to park at [address], [place], as staff will be escorting the patient through the east entrance of the hospital. Appointments take approximately 45 minutes.    There is a wave flag banner located near the entrance on [address] Turn into this entrance and immediately turn left or right and park in 1 of the 10 designated Covid Infusion Parking spots. There is a phone number on the sign, please call and let the staff know what spot you are in and we will come out and get you. For questions call [phone].  Thanks. 

## 2020-11-13 NOTE — Discharge Instructions (Signed)
Patient scheduled for outpatient Remdesivir infusions at 2:30pm on Saturday 2/5, Monday 2/7 and Tuesday 2/8 at [facility]. Please inform the patient to park at [place], as staff will be escorting the patient through the [place] entrance of the hospital. Appointments take approximately 45 minutes.    There is a wave flag banner located near the entrance on [address]. Turn into this entrance and immediately turn left or right and park in 1 of the 10 designated Covid Infusion Parking spots. There is a phone number on the sign, please call and let the staff know what spot you are in and we will come out and get you. For questions call [phone].  Thanks.

## 2020-11-13 NOTE — Discharge Summary (Signed)
Physician Discharge Summary  Anthony Hensley [id number] DOB: 1961/04/28 DOA: 11/12/2020  PCP: Alycia Rossetti, MD  Admit date: 11/12/2020 Discharge date: 11/13/2020  Time spent: 35 minutes  Recommendations for Outpatient Follow-up:  1. Repeat chest x-ray in 6 weeks to assure complete resolution of infiltrate 2. Reassess blood pressure and adjust antihypertensive treatment as needed 3. Repeat basic metabolic panel to follow electrolytes and renal function. 4. Assist patient with weight loss management..   Discharge Diagnoses:  Principal Problem:   Pneumonia due to COVID-19 virus Active Problems:   Benign essential HTN   Hypercholesterolemia   GERD (gastroesophageal reflux disease)   CAD (coronary artery disease)   Unstable angina (HCC)   Hypoxia   Obesity, diabetes, and hypertension syndrome ([place])   Discharge Condition: Stable and improved.  Discharged home with instructions to follow-up with PCP in 2 weeks.  Patient will complete outpatient therapy for remdesivir at the infusion center.  Code status: Full Code.  Diet recommendation: Modified carbohydrate diet, heart healthy low calorie diet also encouraged.  Filed Weights   11/11/20 2306  Weight: 111.1 kg    History of present illness:  Anthony Hensley is a 60 y.o. male with medical history significant for CAD, HTN, type 2 diabetes mellitus poorly controlled, hyperlipidemia, and other medical co-morbidities detailed below presents to ED complaining of SOB and fatigue. He had a positive covid test at home 2/1. He reports that wife also tested positive.  He reports he was vaccinated for covid and also was boosted.  His main complaints are cough, chest congestion, shortness of breath and fatigue. He reports that over the last over the last 12 hours he is having increasing chest tightness, wheezing, shortness of breath and malaise.  His wife is also having similar symptoms and also being evaluated in the emergency department.  ED  Course: Patient tested positive for SARS 2 coronavirus.  CXR reports coarse interstitial opacities in the lung bases, can be seen with atypical infection.  He has been determined to be high risk for worsening covid given his many medical co-morbidities and admission was requested.  He has been started on solumedrol IV and remdesivir.  He is not requiring supplemental oxygen at this time.   Hospital Course:  1-Covid pneumonia: Completed protocol on admission meeting criteria for steroids and remdesivir as patient saturation was less than 94%. -Rapid improvement after initiation of remdesivir and steroids -As needed bronchodilators, antitussive/mucolytic's medications provided. -Patient encouraged to continue proning continuous incentive respirometer and flutter valve. -At discharge saturation above 94% at rest and on exertion -Will go home taking Decadron 6 mg on daily basis to complete 10 days; 3 more doses of outpatient remdesivir infusion has been arranged. -Continue supportive care.  2-gastroesophageal reflux disease/GI prophylaxis -Continue PPI.  3-uncontrolled type 2 diabetes with vascular complications -Recent 123456 more than 8% -Anticipating some increase in his CBGs while using the steroids. -Advised to be compliant with avoid hypoglycemic regimen and modify carbohydrate diet. -Close monitoring of patient's CBG by PCP with adjustment to his hypoglycemic therapy as required recommended.  4-history of coronary disease/unstable angina -Stable currently -No chest pain -Resume home cardiac meds and outpatient follow up with cardiology service.  5-essential hypertension -stable and well controlled. -Low-sodium diet and continuation of antihypertensive agents concomitantly.  6-depression/anxiety  -continue sertraline daily as per home regimen.  -No suicidal ideation or hallucinations.  7-mixed hyperlipidemia -Resume the use of Crestor and Zetia  8-class II obesity -Body mass index  is 39.54 kg/m. -Low  calorie diet, portion control and increase physical activity discussed with patient. -   Procedures: See below for x-ray reports.  Consultations:  None   Discharge Exam: Vitals:   11/13/20 0807 11/13/20 1433  BP:  114/87  Pulse:  92  Resp:  17  Temp:  97.6 F (36.4 C)  SpO2: 95% 92%    General: Afebrile, no chest pain, no nausea, no vomiting.  Speaking in full sentences and feeling significantly better. Will like to go home with arranged outpatient therapy.  No requiring oxygen supplementation and no desaturation screening was able to keep O2 sats above 94% at rest and on exertion. Cardiovascular: S1-S2, no gallops, no gallops, unable to focus easily.  Body habitus.  Denies chest pain or palpitations. Respiratory: Improved air movement bilaterally; no wheezing, no crackles, no using accessory muscles. Abdomen: Obese, nontender, nondistended, positive bowel sounds Extremities: No cyanosis or clubbing.  Discharge Instructions   Discharge Instructions    Diet - low sodium heart healthy   Complete by: As directed    Diet Carb Modified   Complete by: As directed    Discharge instructions   Complete by: As directed    Take medications as prescribed Maintain adequate hydration Follow modified carbohydrate and low-cholesterol diet Arrange follow-up with PCP in 2 weeks Continue Practice the 3 W's: Wash hands frequently, Wear a mask and Wait 6 feet apart.   Increase activity slowly   Complete by: As directed      Allergies as of 11/13/2020   No Known Allergies     Medication List    STOP taking these medications   ProAir HFA 108 (90 Base) MCG/ACT inhaler Generic drug: albuterol     TAKE these medications   ascorbic acid 500 MG tablet Commonly known as: VITAMIN C Take 1 tablet (500 mg total) by mouth daily. Start taking on: November 14, 2020   aspirin 81 MG EC tablet Take 1 tablet (81 mg total) by mouth daily.   dexamethasone 6 MG  tablet Commonly known as: DECADRON Take 1 tablet (6 mg total) by mouth daily for 10 days.   ezetimibe 10 MG tablet Commonly known as: ZETIA Take 1 tablet (10 mg total) by mouth daily.   guaiFENesin-dextromethorphan 100-10 MG/5ML syrup Commonly known as: ROBITUSSIN DM Take 10 mLs by mouth every 4 (four) hours as needed for cough.   Ipratropium-Albuterol 20-100 MCG/ACT Aers respimat Commonly known as: COMBIVENT Inhale 1 puff into the lungs every 6 (six) hours as needed for wheezing or shortness of breath.   isosorbide mononitrate 60 MG 24 hr tablet Commonly known as: IMDUR Take 1.5 tablets (90 mg total) by mouth daily.   lisinopril 10 MG tablet Commonly known as: ZESTRIL Take 1 tablet (10 mg total) by mouth daily.   metFORMIN 500 MG tablet Commonly known as: GLUCOPHAGE Take 1 tablet (500 mg total) by mouth 2 (two) times daily with a meal.   metoprolol succinate 25 MG 24 hr tablet Commonly known as: TOPROL-XL Take 1 tablet (25 mg total) by mouth daily.   nitroGLYCERIN 0.4 MG SL tablet Commonly known as: NITROSTAT Place 1 tablet (0.4 mg total) under the tongue every 5 (five) minutes as needed for chest pain.   pantoprazole 40 MG tablet Commonly known as: PROTONIX TAKE 1 TABLET BY MOUTH 2 TIMES DAILY BEFORE A MEAL   rosuvastatin 40 MG tablet Commonly known as: CRESTOR TAKE 1 TABLET BY MOUTH EVERY DAY AT 6 P.M. What changed:   how much to take  how to take this  when to take this  additional instructions   sertraline 50 MG tablet Commonly known as: Zoloft Take 1 tablet (50 mg total) by mouth at bedtime. For mood   VISINE TEARS OP Apply 1 drop to eye daily as needed (dry eyes).   zinc sulfate 220 (50 Zn) MG capsule Take 1 capsule (220 mg total) by mouth daily. Start taking on: November 14, 2020      No Known Allergies  Follow-up Information    [place], Modena Nunnery, MD. Schedule an appointment as soon as possible for a visit in 2 week(s).   Specialty: Family  Medicine Contact information: [address] [place] [address] [phone]               The results of significant diagnostics from this hospitalization (including imaging, microbiology, ancillary and laboratory) are listed below for reference.    Significant Diagnostic Studies: DG Chest Port 1 View  Result Date: 11/12/2020 CLINICAL DATA:  Shortness of breath, COVID-19 positive, former smoker EXAM: PORTABLE CHEST 1 VIEW COMPARISON:  10/30/2019, 01/11/2018. FINDINGS: Mild airways thickening with slightly coarsened interstitial changes in the mid to lower lungs. Question some peripheral basilar opacity on the right though possibly accentuated by body habitus. No pneumothorax. No effusion. Cardiomediastinal contours are unremarkable. Telemetry leads overlie the chest. No acute osseous or soft tissue abnormality. Degenerative changes are present in the imaged spine and shoulders. IMPRESSION: Mild airways thickening with streaky coarse interstitial opacities in the lung bases, can be seen with atypical infection. Electronically Signed   By: Lovena Le M.D.   On: 11/12/2020 04:38    Microbiology: Recent Results (from the past 240 hour(s))  SARS Coronavirus 2 by RT PCR (hospital order, performed in [facility] hospital lab) Nasopharyngeal Nasopharyngeal Swab     Status: Abnormal   Collection Time: 11/12/20  3:43 AM   Specimen: Nasopharyngeal Swab  Result Value Ref Range Status   SARS Coronavirus 2 POSITIVE (A) NEGATIVE Final    Comment: RESULT CALLED TO, READ BACK BY AND VERIFIED WITH: T WALKER,RN@0500  11/12/20 MKELLY (NOTE) SARS-CoV-2 target nucleic acids are DETECTED  SARS-CoV-2 RNA is generally detectable in upper respiratory specimens  during the acute phase of infection.  Positive results are indicative  of the presence of the identified virus, but do not rule out bacterial infection or co-infection with other pathogens not detected by the test.  Clinical correlation  with patient history and  other diagnostic information is necessary to determine patient infection status.  The expected result is negative.  Fact Sheet for Patients:   StrictlyIdeas.no   Fact Sheet for Healthcare Providers:   BankingDealers.co.za    This test is not yet approved or cleared by the [place] FDA and  has been authorized for detection and/or diagnosis of SARS-CoV-2 by FDA under an Emergency Use Authorization (EUA).  This EUA will remain in effect (meaning this test  can be used) for the duration of  the COVID-19 declaration under Section 564(b)(1) of the Act, 21 U.S.C. section 360-bbb-3(b)(1), unless the authorization is terminated or revoked sooner.  Performed at [facility], [address], [place], [place] [address]   Blood Culture (routine x 2)     Status: None (Preliminary result)   Collection Time: 11/12/20  3:55 AM   Specimen: BLOOD LEFT FOREARM  Result Value Ref Range Status   Specimen Description BLOOD LEFT FOREARM  Final   Special Requests   Final    BOTTLES  DRAWN AEROBIC AND ANAEROBIC Blood Culture adequate volume   Culture   Final    NO GROWTH 1 DAY Performed at [facility], [address], [place], [place] [address]    Report Status PENDING  Incomplete  Blood Culture (routine x 2)     Status: None (Preliminary result)   Collection Time: 11/12/20  3:55 AM   Specimen: BLOOD RIGHT FOREARM  Result Value Ref Range Status   Specimen Description BLOOD RIGHT FOREARM  Final   Special Requests   Final    BOTTLES DRAWN AEROBIC AND ANAEROBIC Blood Culture adequate volume   Culture   Final    NO GROWTH 1 DAY Performed at [facility], [address] [place], [place] [address]    Report Status PENDING  Incomplete     Labs: Basic Metabolic Panel: Recent Labs  Lab 11/12/20 0343 11/13/20 0408  NA 133* 135  K 4.0 4.6  CL 101 101  CO2 23 25  GLUCOSE 175* 218*  BUN 19 26*  CREATININE 0.91 0.92   CALCIUM 9.0 9.5  MG  --  1.9  PHOS  --  2.9   Liver Function Tests: Recent Labs  Lab 11/12/20 0343 11/13/20 0408  AST 48* 53*  ALT 75* 80*  ALKPHOS 75 70  BILITOT 0.8 0.6  PROT 7.7 7.8  ALBUMIN 4.2 4.0   CBC: Recent Labs  Lab 11/12/20 0343 11/13/20 0408  WBC 5.8 6.8  NEUTROABS 4.1 5.5  HGB 15.1 15.9  HCT 45.8 48.4  MCV 90.3 92.7  PLT 195 187   CBG: Recent Labs  Lab 11/12/20 1758 11/12/20 2115 11/13/20 0441 11/13/20 0802 11/13/20 1112  GLUCAP 292* 172* 216* 199* 273*   Signed:  Barton Dubois MD.  [facility] 11/13/2020, 2:37 PM

## 2020-11-13 NOTE — Progress Notes (Signed)
SATURATION QUALIFICATIONS: (This note is used to comply with regulatory documentation for home oxygen)  Patient Saturations on Room Air at Rest = 96%  Patient Saturations on Room Air while Ambulating = 94%  Patient Saturations on 0 Liters of oxygen while Ambulating = 94%  Please briefly explain why patient needs home oxygen:

## 2020-11-13 NOTE — Progress Notes (Signed)
Nsg Discharge Note  Admit Date:  11/12/2020 Discharge date: 11/13/2020   [name] to be D/C'd Home per MD order.  AVS completed.  Copy for chart, and copy for patient signed, and dated. Patient/caregiver able to verbalize understanding.  Discharge Medication: Allergies as of 11/13/2020   No Known Allergies     Medication List    STOP taking these medications   ProAir HFA 108 (90 Base) MCG/ACT inhaler Generic drug: albuterol     TAKE these medications   ascorbic acid 500 MG tablet Commonly known as: VITAMIN C Take 1 tablet (500 mg total) by mouth daily. Start taking on: November 14, 2020   aspirin 81 MG EC tablet Take 1 tablet (81 mg total) by mouth daily.   dexamethasone 6 MG tablet Commonly known as: DECADRON Take 1 tablet (6 mg total) by mouth daily for 10 days.   ezetimibe 10 MG tablet Commonly known as: ZETIA Take 1 tablet (10 mg total) by mouth daily.   guaiFENesin-dextromethorphan 100-10 MG/5ML syrup Commonly known as: ROBITUSSIN DM Take 10 mLs by mouth every 4 (four) hours as needed for cough.   Ipratropium-Albuterol 20-100 MCG/ACT Aers respimat Commonly known as: COMBIVENT Inhale 1 puff into the lungs every 6 (six) hours as needed for wheezing or shortness of breath.   isosorbide mononitrate 60 MG 24 hr tablet Commonly known as: IMDUR Take 1.5 tablets (90 mg total) by mouth daily.   lisinopril 10 MG tablet Commonly known as: ZESTRIL Take 1 tablet (10 mg total) by mouth daily.   metFORMIN 500 MG tablet Commonly known as: GLUCOPHAGE Take 1 tablet (500 mg total) by mouth 2 (two) times daily with a meal.   metoprolol succinate 25 MG 24 hr tablet Commonly known as: TOPROL-XL Take 1 tablet (25 mg total) by mouth daily.   nitroGLYCERIN 0.4 MG SL tablet Commonly known as: NITROSTAT Place 1 tablet (0.4 mg total) under the tongue every 5 (five) minutes as needed for chest pain.   pantoprazole 40 MG tablet Commonly known as: PROTONIX TAKE 1 TABLET BY MOUTH  2 TIMES DAILY BEFORE A MEAL   rosuvastatin 40 MG tablet Commonly known as: CRESTOR TAKE 1 TABLET BY MOUTH EVERY DAY AT 6 P.M. What changed:   how much to take  how to take this  when to take this  additional instructions   sertraline 50 MG tablet Commonly known as: Zoloft Take 1 tablet (50 mg total) by mouth at bedtime. For mood   VISINE TEARS OP Apply 1 drop to eye daily as needed (dry eyes).   zinc sulfate 220 (50 Zn) MG capsule Take 1 capsule (220 mg total) by mouth daily. Start taking on: November 14, 2020       Discharge Assessment: Vitals:   11/13/20 0807 11/13/20 1433  BP:  114/87  Pulse:  92  Resp:  17  Temp:  97.6 F (36.4 C)  SpO2: 95% 92%   Skin clean, dry and intact without evidence of skin break down, no evidence of skin tears noted. IV catheter discontinued intact. Site without signs and symptoms of complications - no redness or edema noted at insertion site, patient denies c/o pain - only slight tenderness at site.  Dressing with slight pressure applied.  D/c Instructions-Education: Discharge instructions given to patient/family with verbalized understanding. D/c education completed with patient/family including follow up instructions, medication list, d/c activities limitations if indicated, with other d/c instructions as indicated by MD - patient able to verbalize understanding, all questions  fully answered. Patient instructed to return to ED, call 911, or call MD for any changes in condition.  Patient escorted via [place], and D/C home via private auto.  Dorcas Mcmurray, LPN 03/10/4430 5:40 PM

## 2020-11-14 ENCOUNTER — Ambulatory Visit (HOSPITAL_COMMUNITY)
Admit: 2020-11-14 | Discharge: 2020-11-14 | Disposition: A | Discharge status: Home / Self Care | Payer: Self-pay | Source: Ambulatory Visit | Attending: Pulmonary Disease | Admitting: Pulmonary Disease

## 2020-11-14 DIAGNOSIS — J1282 Pneumonia due to coronavirus disease 2019: Secondary | ICD-10-CM | POA: Insufficient documentation

## 2020-11-14 DIAGNOSIS — U071 COVID-19: Secondary | ICD-10-CM | POA: Insufficient documentation

## 2020-11-14 MED ORDER — ALBUTEROL SULFATE HFA 108 (90 BASE) MCG/ACT IN AERS: 2.0000 | INHALATION_SPRAY | Freq: Once | RESPIRATORY_TRACT | Status: DC | PRN

## 2020-11-14 MED ORDER — EPINEPHRINE 0.3 MG/0.3ML IJ SOAJ: 0.3000 mg | Freq: Once | INTRAMUSCULAR | Status: DC | PRN

## 2020-11-14 MED ORDER — METHYLPREDNISOLONE SODIUM SUCC 125 MG IJ SOLR: 125.0000 mg | Freq: Once | INTRAMUSCULAR | Status: DC | PRN

## 2020-11-14 MED ORDER — FAMOTIDINE IN NACL 20-0.9 MG/50ML-% IV SOLN: 20.0000 mg | Freq: Once | INTRAVENOUS | Status: DC | PRN

## 2020-11-14 MED ORDER — DIPHENHYDRAMINE HCL 50 MG/ML IJ SOLN: 50.0000 mg | Freq: Once | INTRAMUSCULAR | Status: DC | PRN

## 2020-11-14 MED ORDER — SODIUM CHLORIDE 0.9 % IV SOLN: INTRAVENOUS | Status: DC | PRN

## 2020-11-14 MED ORDER — SODIUM CHLORIDE 0.9 % IV SOLN
100.0000 mg | Freq: Once | INTRAVENOUS | Status: AC
  Administered 2020-11-14: 100 mg via INTRAVENOUS

## 2020-11-14 NOTE — Progress Notes (Signed)
  Diagnosis: COVID-19  Physician: Dr Joya Gaskins  Procedure: [facility] Med: remdesivir infusion - Provided patient with remdesivir fact sheet for patients, parents and caregivers prior to infusion.  Complications: No immediate complications noted.  Discharge: Discharged home   Ashley Murrain 11/14/2020

## 2020-11-14 NOTE — Progress Notes (Signed)
Patient reviewed Fact Sheet for Patients, Parents, and Caregivers for Emergency Use Authorization (EUA) of sotrovimab for the Treatment of Coronavirus. Patient also reviewed and is agreeable to the estimated cost of treatment. Patient is agreeable to proceed.   

## 2020-11-14 NOTE — Discharge Instructions (Signed)
What types of side effects do monoclonal antibody drugs cause?  Common side effects  In general, the more common side effects caused by monoclonal antibody drugs include:  Allergic reactions, such as hives or itching  Flu-like signs and symptoms, including chills, fatigue, fever, and muscle aches and pains  Nausea, vomiting  Diarrhea  Skin rashes  Low blood pressure   The CDC is recommending patients who receive monoclonal antibody treatments wait at least 90 days before being vaccinated.  Currently, there are no data on the safety and efficacy of mRNA COVID-19 vaccines in persons who received monoclonal antibodies or convalescent plasma as part of COVID-19 treatment. Based on the estimated half-life of such therapies as well as evidence suggesting that reinfection is uncommon in the 90 days after initial infection, vaccination should be deferred for at least 90 days, as a precautionary measure until additional information becomes available, to avoid interference of the antibody treatment with vaccine-induced immune responses.  If you have any questions or concerns after the infusion please call the Advanced Practice Provider on call at [phone]. This number is ONLY intended for your use regarding questions or concerns about the infusion post-treatment side-effects.  Please do not provide this number to others for use. For return to work notes please contact your primary care provider.   If someone you know is interested in receiving treatment please have them contact their MD for a referral or visit [email or web address]   Patient reviewed Fact Sheet for Patients, Parents, and Caregivers for Emergency Use Authorization (EUA) of sotrovimab for the Treatment of Coronavirus. Patient also reviewed and is agreeable to the estimated cost of treatment. Patient is agreeable to proceed.

## 2020-11-16 ENCOUNTER — Ambulatory Visit (HOSPITAL_COMMUNITY)
Admit: 2020-11-16 | Discharge: 2020-11-16 | Disposition: A | Discharge status: Home / Self Care | Payer: Self-pay | Attending: Pulmonary Disease | Admitting: Pulmonary Disease

## 2020-11-16 ENCOUNTER — Telehealth: Payer: Self-pay | Admitting: *Deleted

## 2020-11-16 DIAGNOSIS — U071 COVID-19: Secondary | ICD-10-CM | POA: Insufficient documentation

## 2020-11-16 MED ORDER — ALBUTEROL SULFATE HFA 108 (90 BASE) MCG/ACT IN AERS: 2.0000 | INHALATION_SPRAY | Freq: Once | RESPIRATORY_TRACT | Status: DC | PRN

## 2020-11-16 MED ORDER — FAMOTIDINE IN NACL 20-0.9 MG/50ML-% IV SOLN: 20.0000 mg | Freq: Once | INTRAVENOUS | Status: DC | PRN

## 2020-11-16 MED ORDER — EPINEPHRINE 0.3 MG/0.3ML IJ SOAJ: 0.3000 mg | Freq: Once | INTRAMUSCULAR | Status: DC | PRN

## 2020-11-16 MED ORDER — SODIUM CHLORIDE 0.9 % IV SOLN
100.0000 mg | Freq: Once | INTRAVENOUS | Status: AC
  Administered 2020-11-16: 100 mg via INTRAVENOUS

## 2020-11-16 MED ORDER — METHYLPREDNISOLONE SODIUM SUCC 125 MG IJ SOLR: 125.0000 mg | Freq: Once | INTRAMUSCULAR | Status: DC | PRN

## 2020-11-16 MED ORDER — DIPHENHYDRAMINE HCL 50 MG/ML IJ SOLN: 50.0000 mg | Freq: Once | INTRAMUSCULAR | Status: DC | PRN

## 2020-11-16 MED ORDER — SODIUM CHLORIDE 0.9 % IV SOLN: INTRAVENOUS | Status: DC | PRN

## 2020-11-16 NOTE — Telephone Encounter (Signed)
Transition Care Management Follow-up Telephone Call  Date of discharge and from where: 11/13/2020 - [facility]  How have you been since you were released from the hospital? "He is doing better since being discharged." - pt's wife, Anthony Hensley  Any questions or concerns? No  Items Reviewed:  Did the pt receive and understand the discharge instructions provided? Yes   Medications obtained and verified? Yes   Other? N/A  Any new allergies since your discharge? No   Dietary orders reviewed? Yes  Do you have support at home? Yes   Home Care and Equipment/Supplies: Were home health services ordered? not applicable If so, what is the name of the agency? N/A  Has the agency set up a time to come to the patient's home? not applicable Were any new equipment or medical supplies ordered?  No What is the name of the medical supply agency? N/A Were you able to get the supplies/equipment? not applicable Do you have any questions related to the use of the equipment or supplies? No  Functional Questionnaire: (I = Independent and D = Dependent) ADLs: I  Bathing/Dressing- I  Meal Prep- I  Eating- I  Maintaining continence- I  Transferring/Ambulation- I  Managing Meds- I  Follow up appointments reviewed:   PCP Hospital f/u appt confirmed? No    Specialist Hospital f/u appt confirmed? No    Are transportation arrangements needed? N/A  If their condition worsens, is the pt aware to call PCP or go to the Emergency Dept.? Yes  Was the patient provided with contact information for the PCP's office or ED? Yes  Was to pt encouraged to call back with questions or concerns? Yes

## 2020-11-16 NOTE — Progress Notes (Signed)
  Diagnosis: COVID-19  Physician: Dr. Joya Gaskins   Procedure: [facility]fusion [facility] Med: remdesivir infusion - Provided patient with remdesivir fact sheet for patients, parents and caregivers prior to infusion.  Complications: No immediate complications noted.  Discharge: Discharged home   Anthony Hensley 11/16/2020 ,

## 2020-11-17 ENCOUNTER — Ambulatory Visit (HOSPITAL_COMMUNITY)
Admission: RE | Admit: 2020-11-17 | Discharge: 2020-11-17 | Disposition: A | Discharge status: Home / Self Care | Payer: Self-pay | Source: Ambulatory Visit | Attending: Pulmonary Disease | Admitting: Pulmonary Disease

## 2020-11-17 DIAGNOSIS — J1282 Pneumonia due to coronavirus disease 2019: Secondary | ICD-10-CM | POA: Insufficient documentation

## 2020-11-17 DIAGNOSIS — U071 COVID-19: Secondary | ICD-10-CM | POA: Insufficient documentation

## 2020-11-17 LAB — CULTURE, BLOOD (ROUTINE X 2)
Culture: NO GROWTH
Culture: NO GROWTH
Special Requests: ADEQUATE
Special Requests: ADEQUATE

## 2020-11-17 MED ORDER — DIPHENHYDRAMINE HCL 50 MG/ML IJ SOLN: 50.0000 mg | Freq: Once | INTRAMUSCULAR | Status: DC | PRN

## 2020-11-17 MED ORDER — METHYLPREDNISOLONE SODIUM SUCC 125 MG IJ SOLR: 125.0000 mg | Freq: Once | INTRAMUSCULAR | Status: DC | PRN

## 2020-11-17 MED ORDER — SODIUM CHLORIDE 0.9 % IV SOLN: INTRAVENOUS | Status: DC | PRN

## 2020-11-17 MED ORDER — EPINEPHRINE 0.3 MG/0.3ML IJ SOAJ: 0.3000 mg | Freq: Once | INTRAMUSCULAR | Status: DC | PRN

## 2020-11-17 MED ORDER — FAMOTIDINE IN NACL 20-0.9 MG/50ML-% IV SOLN: 20.0000 mg | Freq: Once | INTRAVENOUS | Status: DC | PRN

## 2020-11-17 MED ORDER — SODIUM CHLORIDE 0.9 % IV SOLN
100.0000 mg | Freq: Once | INTRAVENOUS | Status: AC
  Administered 2020-11-17: 100 mg via INTRAVENOUS

## 2020-11-17 MED ORDER — ALBUTEROL SULFATE HFA 108 (90 BASE) MCG/ACT IN AERS: 2.0000 | INHALATION_SPRAY | Freq: Once | RESPIRATORY_TRACT | Status: DC | PRN

## 2020-11-17 NOTE — Progress Notes (Signed)
Patient reviewed Fact Sheet for Patients, Parents, and Caregivers for Emergency Use Authorization (EUA) of remdesivir for the Treatment of Coronavirus. Patient also reviewed and is agreeable to the estimated cost of treatment. Patient is agreeable to proceed.    

## 2020-11-17 NOTE — Progress Notes (Signed)
  Diagnosis: COVID-19  Physician: Dr. Patrick Wright  Procedure: [facility]fusion Clinic Med: remdesivir infusion - Provided patient with remdesivir fact sheet for patients, parents and caregivers prior to infusion.  Complications: No immediate complications noted.  Discharge: Discharged home   Anthony Hensley 11/17/2020  

## 2020-11-17 NOTE — Discharge Instructions (Signed)
10 Things You Can Do to Manage Your COVID-19 Symptoms at Home °If you have possible or confirmed COVID-19: °1. Stay home except to get medical care. °2. Monitor your symptoms carefully. If your symptoms get worse, call your healthcare provider immediately. °3. Get rest and stay hydrated. °4. If you have a medical appointment, call the healthcare provider ahead of time and tell them that you have or may have COVID-19. °5. For medical emergencies, call 911 and notify the dispatch personnel that you have or may have COVID-19. °6. Cover your cough and sneezes with a tissue or use the inside of your elbow. °7. Wash your hands often with soap and water for at least 20 seconds or clean your hands with an alcohol-based hand sanitizer that contains at least 60% alcohol. °8. As much as possible, stay in a specific room and away from other people in your home. Also, you should use a separate bathroom, if available. If you need to be around other people in or outside of the home, wear a mask. °9. Avoid sharing personal items with other people in your household, like dishes, towels, and bedding. °10. Clean all surfaces that are touched often, like counters, tabletops, and doorknobs. Use household cleaning sprays or wipes according to the label instructions. °cdc.gov/coronavirus °04/24/2020 °This information is not intended to replace advice given to you by your health care provider. Make sure you discuss any questions you have with your health care provider. °Document Revised: 08/10/2020 Document Reviewed: 08/10/2020 °Elsevier Patient Education © 2021 Elsevier Inc. °If you have any questions or concerns after the infusion please call the Advanced Practice Provider on call at [phone]. This number is ONLY intended for your use regarding questions or concerns about the infusion post-treatment side-effects.  Please do not provide this number to others for use. For return to work notes please contact your primary care provider.   ° °If someone you know is interested in receiving treatment please have them contact their MD for a referral or visit [email or web address] ° ° ° °

## 2020-11-18 ENCOUNTER — Other Ambulatory Visit: Payer: Self-pay | Admitting: *Deleted

## 2020-11-18 MED ORDER — METOPROLOL SUCCINATE ER 25 MG PO TB24: 25.0000 mg | ORAL_TABLET | Freq: Every day | ORAL | 0 refills | Status: DC

## 2020-11-23 ENCOUNTER — Telehealth: Payer: Self-pay | Admitting: *Deleted

## 2020-11-23 MED ORDER — BLOOD GLUCOSE TEST VI STRP: ORAL_STRIP | 11 refills | Status: DC

## 2020-11-23 MED ORDER — BLOOD GLUCOSE SYSTEM PAK KIT: PACK | 1 refills | Status: DC

## 2020-11-23 MED ORDER — LANCETS MISC: 11 refills | Status: DC

## 2020-11-23 NOTE — Telephone Encounter (Signed)
Received call from patient wife [name].   Reports that patient has been taking Metformin since January 2022. States that since he has been taking MTF, he has had increased GI issues (multiple loose stools daily). Also states that she has noted he is always sweating and drinking a lot of water. States that she has not been checking his blood sugars.   Meter sent to pharmacy. Advised to stop MTF to see if Sx resolve.

## 2020-11-23 NOTE — Telephone Encounter (Signed)
Stay off the meformin until appt next week with Janett Billow We can recheck his labs and give alternative

## 2020-11-24 NOTE — Telephone Encounter (Signed)
Spoke with pt verbalized understanding 

## 2020-11-30 ENCOUNTER — Other Ambulatory Visit: Payer: Self-pay

## 2020-11-30 ENCOUNTER — Encounter: Payer: Self-pay | Admitting: Nurse Practitioner

## 2020-11-30 ENCOUNTER — Ambulatory Visit (INDEPENDENT_AMBULATORY_CARE_PROVIDER_SITE_OTHER): Payer: Self-pay | Admitting: Nurse Practitioner

## 2020-11-30 VITALS — BP 120/80 | HR 100 | Temp 96.6°F | Ht 66.0 in | Wt 212.3 lb

## 2020-11-30 DIAGNOSIS — U071 COVID-19: Secondary | ICD-10-CM

## 2020-11-30 DIAGNOSIS — E785 Hyperlipidemia, unspecified: Secondary | ICD-10-CM | POA: Insufficient documentation

## 2020-11-30 DIAGNOSIS — J1282 Pneumonia due to coronavirus disease 2019: Secondary | ICD-10-CM

## 2020-11-30 DIAGNOSIS — Z1159 Encounter for screening for other viral diseases: Secondary | ICD-10-CM

## 2020-11-30 DIAGNOSIS — E1169 Type 2 diabetes mellitus with other specified complication: Secondary | ICD-10-CM

## 2020-11-30 MED ORDER — METFORMIN HCL ER 750 MG PO TB24: 750.0000 mg | ORAL_TABLET | Freq: Every day | ORAL | 1 refills | Status: DC

## 2020-11-30 MED ORDER — DAPAGLIFLOZIN PROPANEDIOL 5 MG PO TABS: 5.0000 mg | ORAL_TABLET | Freq: Every day | ORAL | 1 refills | Status: DC

## 2020-11-30 NOTE — Patient Instructions (Signed)
F/u 1 month for COVID and diabetes

## 2020-11-30 NOTE — Assessment & Plan Note (Addendum)
Acute, ongoing.  Oxygen saturation normal today and lung sounds are clear.  Will need repeat chest x-ray near the middle of March to ensure complete resolution.  If shortness of breath and weight loss continue despite treatment of diabetes and COVID-19 recovery, may consider low dose CT screening; >26 pack-year smoking history.
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
## 2020-11-30 NOTE — Assessment & Plan Note (Addendum)
Chronic, ongoing.  Last A1c in January 2022 was 8.4%.  Started Metformin, stopped taking it for an unknown reason.  Steroids post-hospitalization likely contributing to elevated blood glucose.  Will restart Metformin 750 mg XR to help prevent side effects.  Will also start Farxiga to help with blood sugar control.  At this point, sweating, polyuria, polydipsia, and weight loss likely due to uncontrolled diabetes and elevated blood sugars.  Discussed this at length with the patient.  CMP and urine microalbumin today.  Referral placed to Ophthalmologist.  Foot exam today normal.  Consider nutritionist referral in future.  Follow up in 1 month for diabetes.

## 2020-12-01 ENCOUNTER — Telehealth: Payer: Self-pay | Admitting: Family Medicine

## 2020-12-01 LAB — MICROALBUMIN / CREATININE URINE RATIO
Creatinine, Urine: 45 mg/dL (ref 20–320)
Microalb Creat Ratio: 29 mcg/mg creat (ref <30)
Microalb, Ur: 1.3 mg/dL

## 2020-12-01 LAB — COMPLETE METABOLIC PANEL WITH GFR
AG Ratio: 1.6 (calc) (ref 1.0–2.5)
ALT: 62 U/L — ABNORMAL HIGH (ref 9–46)
AST: 32 U/L (ref 10–35)
Albumin: 3.9 g/dL (ref 3.6–5.1)
Alkaline phosphatase (APISO): 84 U/L (ref 35–144)
BUN: 15 mg/dL (ref 7–25)
CO2: 26 mmol/L (ref 20–32)
Calcium: 9.5 mg/dL (ref 8.6–10.3)
Chloride: 99 mmol/L (ref 98–110)
Creat: 0.84 mg/dL (ref 0.70–1.33)
GFR, Est African American: 113 mL/min/{1.73_m2} (ref >=60)
GFR, Est Non African American: 97 mL/min/{1.73_m2} (ref >=60)
Globulin: 2.4 g/dL (calc) (ref 1.9–3.7)
Glucose, Bld: 406 mg/dL — ABNORMAL HIGH (ref 65–99)
Potassium: 4.5 mmol/L (ref 3.5–5.3)
Sodium: 137 mmol/L (ref 135–146)
Total Bilirubin: 0.4 mg/dL (ref 0.2–1.2)
Total Protein: 6.3 g/dL (ref 6.1–8.1)

## 2020-12-01 LAB — HEPATITIS C ANTIBODY
Hepatitis C Ab: NONREACTIVE
SIGNAL TO CUT-OFF: 0

## 2020-12-01 NOTE — Progress Notes (Signed)
Pt verbalized understanding.

## 2020-12-01 NOTE — Telephone Encounter (Signed)
Pt wife called and stated that the pt is confused as to dosage he is to take of this med  metFORMIN (GLUCOPHAGE XR) 750 MG  Please call back Cb#: [phone]

## 2020-12-01 NOTE — Telephone Encounter (Signed)
Spoke with pt regarding sig written on metformin dose

## 2020-12-03 ENCOUNTER — Encounter: Payer: Self-pay | Admitting: Internal Medicine

## 2020-12-14 NOTE — Telephone Encounter (Signed)
error 

## 2020-12-24 ENCOUNTER — Other Ambulatory Visit: Payer: Self-pay | Admitting: Family Medicine

## 2020-12-29 ENCOUNTER — Other Ambulatory Visit: Payer: Self-pay | Admitting: Family Medicine

## 2020-12-29 DIAGNOSIS — E1169 Type 2 diabetes mellitus with other specified complication: Secondary | ICD-10-CM

## 2020-12-30 ENCOUNTER — Other Ambulatory Visit: Payer: Self-pay

## 2020-12-30 ENCOUNTER — Encounter: Payer: Self-pay | Admitting: Nurse Practitioner

## 2020-12-30 ENCOUNTER — Ambulatory Visit (INDEPENDENT_AMBULATORY_CARE_PROVIDER_SITE_OTHER): Payer: Self-pay | Admitting: Nurse Practitioner

## 2020-12-30 VITALS — BP 132/88 | HR 100 | Temp 97.8°F | Ht 66.0 in | Wt 210.4 lb

## 2020-12-30 DIAGNOSIS — E1165 Type 2 diabetes mellitus with hyperglycemia: Secondary | ICD-10-CM

## 2020-12-30 DIAGNOSIS — R918 Other nonspecific abnormal finding of lung field: Secondary | ICD-10-CM

## 2020-12-30 DIAGNOSIS — Z8616 Personal history of COVID-19: Secondary | ICD-10-CM

## 2020-12-30 NOTE — Assessment & Plan Note (Signed)
With pneumonia in January 2022 -will obtain a repeat chest x-ray today to ensure complete resolution.  Weight loss has leveled off-if continues, will want to obtain low-dose CT given smoking history.

## 2020-12-30 NOTE — Patient Instructions (Signed)
F/u 1 month for DM, HTN, labs

## 2020-12-30 NOTE — Assessment & Plan Note (Signed)
Chronic.  A1c in January, 2022 was 8.4%.  Blood sugar at last visit was greater than 400.  Patient reports significant improvement in his blood sugar and monitor correlates with what he is saying.  We will continue extended release Metformin 750 mg daily along with Farxiga 5 mg daily for now.  Follow-up in 1 month for diabetes recheck with A1c.  Continue checking fasting blood sugars.
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
## 2021-01-01 ENCOUNTER — Ambulatory Visit (HOSPITAL_COMMUNITY)
Admission: RE | Admit: 2021-01-01 | Discharge: 2021-01-01 | Disposition: A | Discharge status: Home / Self Care | Payer: Self-pay | Source: Ambulatory Visit | Attending: Nurse Practitioner | Admitting: Nurse Practitioner

## 2021-01-01 ENCOUNTER — Other Ambulatory Visit: Payer: Self-pay

## 2021-01-01 DIAGNOSIS — Z8616 Personal history of COVID-19: Secondary | ICD-10-CM | POA: Insufficient documentation

## 2021-01-02 ENCOUNTER — Other Ambulatory Visit: Payer: Self-pay | Admitting: Gastroenterology

## 2021-01-04 MED ORDER — AZITHROMYCIN 250 MG PO TABS: ORAL_TABLET | ORAL | 0 refills | Status: DC

## 2021-01-04 NOTE — Addendum Note (Signed)
Addended by: Noemi Chapel A on: 01/04/2021 10:58 AM   Modules accepted: Orders

## 2021-01-04 NOTE — Telephone Encounter (Signed)
Lmom, waiting on a return call.  

## 2021-01-04 NOTE — Telephone Encounter (Signed)
Patient has not been seen since April 2019.  He needs an office visit for additional refills on pantoprazole 40 mg BID.  If needed, I can provide a 67-month refill in order to get him to his next office visit.

## 2021-01-12 ENCOUNTER — Telehealth: Payer: Self-pay | Admitting: Nurse Practitioner

## 2021-01-12 NOTE — Telephone Encounter (Signed)
Spouse called back. She is going to see if pt PCP can send in refill instead of seeing [place] as patient has no insurance and has been out of work x 1 year.

## 2021-01-12 NOTE — Telephone Encounter (Signed)
Received call from [place], patient's spouse, to request refill of   pantoprazole (PROTONIX) 40 MG tablet [[id number]]   Pharmacy:  [facility] [place], [place] - [address]  [address] [place] [address]  Phone:  [phone] Fax:  [phone]   Patient is out of medication. Please advise when Rx called in at [phone]

## 2021-01-12 NOTE — Telephone Encounter (Signed)
Left message on patient's voicemail to change 4 week follow up appt from 4/20 to 4/19; Anthony Hensley will not be in the office on 4/20. Requested call back to either confirm or reschedule. Awaiting reply.

## 2021-01-13 NOTE — Telephone Encounter (Signed)
Patient called back to confirm new date and time.

## 2021-01-15 MED ORDER — PANTOPRAZOLE SODIUM 40 MG PO TBEC: DELAYED_RELEASE_TABLET | ORAL | 2 refills | Status: DC

## 2021-01-15 NOTE — Telephone Encounter (Signed)
Prescription sent to pharmacy.

## 2021-01-26 ENCOUNTER — Ambulatory Visit (INDEPENDENT_AMBULATORY_CARE_PROVIDER_SITE_OTHER): Payer: Self-pay | Admitting: Nurse Practitioner

## 2021-01-26 ENCOUNTER — Other Ambulatory Visit: Payer: Self-pay

## 2021-01-26 ENCOUNTER — Encounter: Payer: Self-pay | Admitting: Nurse Practitioner

## 2021-01-26 VITALS — BP 128/82 | Temp 97.9°F | Ht 66.0 in | Wt 209.6 lb

## 2021-01-26 DIAGNOSIS — E1165 Type 2 diabetes mellitus with hyperglycemia: Secondary | ICD-10-CM

## 2021-01-26 DIAGNOSIS — E7800 Pure hypercholesterolemia, unspecified: Secondary | ICD-10-CM

## 2021-01-26 DIAGNOSIS — E785 Hyperlipidemia, unspecified: Secondary | ICD-10-CM

## 2021-01-26 DIAGNOSIS — M25551 Pain in right hip: Secondary | ICD-10-CM

## 2021-01-26 DIAGNOSIS — I2510 Atherosclerotic heart disease of native coronary artery without angina pectoris: Secondary | ICD-10-CM

## 2021-01-26 DIAGNOSIS — E1169 Type 2 diabetes mellitus with other specified complication: Secondary | ICD-10-CM

## 2021-01-26 DIAGNOSIS — I10 Essential (primary) hypertension: Secondary | ICD-10-CM

## 2021-01-26 DIAGNOSIS — Z87891 Personal history of nicotine dependence: Secondary | ICD-10-CM

## 2021-01-26 NOTE — Patient Instructions (Signed)
Diabetes Mellitus Action Plan Following a diabetes action plan is a way for you to manage your diabetes (diabetes mellitus) symptoms. The plan is color-coded to help you understand what actions you need to take based on any symptoms you are having.  If you have symptoms in the red zone, you need medical care right away.  If you have symptoms in the yellow zone, you are having problems.  If you have symptoms in the green zone, you are doing well. Learning about and understanding diabetes can take time. Follow the plan that you develop with your health care provider. Know the target range for your blood sugar (glucose) level, and review your treatment plan with your health care provider at each visit. The target range for my blood sugar level is __________________________ mg/dL. Red zone Get medical help right away if you have any of the following symptoms:  A blood sugar test result that is below 54 mg/dL (3 mmol/L).  A blood sugar test result that is at or above 240 mg/dL (13.3 mmol/L) for 2 days in a row.  Confusion or trouble thinking clearly.  Difficulty breathing.  Sickness or a fever for 2 or more days that is not getting better.  Moderate or large ketone levels in your urine.  Feeling tired or having no energy. If you have any red zone symptoms, do not wait to see if the symptoms will go away. Get medical help right away. Call your local emergency services (911 in the [place] Do not drive yourself to the hospital. If you have severely low blood sugar (severe hypoglycemia) and you cannot eat or drink, you may need glucagon. Make sure a family member or close friend knows how to check your blood sugar and how to give you glucagon. You may need to be treated in a hospital for this condition.   Yellow zone If you have any of the following symptoms, your diabetes is not under control and you may need to make some changes:  A blood sugar test result that is at or above 240 mg/dL (13.3  mmol/L) for 2 days in a row.  Blood sugar test results that are below 70 mg/dL (3.9 mmol/L).  Other symptoms of hypoglycemia, such as: ? Shaking or feeling light-headed. ? Confusion or irritability. ? Feeling hungry. ? Having a fast heartbeat. If you have any yellow zone symptoms:  Treat your hypoglycemia by eating or drinking 15 grams of a rapid-acting carbohydrate. Follow the 15:15 rule: ? Take 15 grams of a rapid-acting carbohydrate, such as:  1 tube of glucose gel.  4 glucose pills.  4 oz (120 mL) of fruit juice.  4 oz (120 mL) of regular (not diet) soda. ? Check your blood sugar 15 minutes after you take the carbohydrate. ? If the repeat blood sugar test is still at or below 70 mg/dL (3.9 mmol/L), take 15 grams of a carbohydrate again. ? If your blood sugar does not increase above 70 mg/dL (3.9 mmol/L) after 3 tries, get medical help right away. ? After your blood sugar returns to normal, eat a meal or a snack within 1 hour.  Keep taking your daily medicines as told by your health care provider.  Check your blood sugar more often than you normally would. ? Write down your results. ? Call your health care provider if you have trouble keeping your blood sugar in your target range.   Green zone These signs mean you are doing well and you can continue what you  are doing to manage your diabetes:  Your blood sugar is within your personal target range. For most people, a blood sugar level before a meal (preprandial) should be 80-130 mg/dL (4.4-7.2 mmol/L).  You feel well, and you are able to do daily activities. If you are in the green zone, continue to manage your diabetes as told by your health care provider. To do this:  Eat a healthy diet.  Exercise regularly.  Check your blood sugar as told by your health care provider.  Take your medicines as told by your health care provider.   Where to find more information  American Diabetes Association (ADA):  diabetes.org  Association of Diabetes Care & Education Specialists (ADCES): diabeteseducator.org Summary  Following a diabetes action plan is a way for you to manage your diabetes symptoms. The plan is color-coded to help you understand what actions you need to take based on any symptoms you are having.  Follow the plan that you develop with your health care provider. Make sure you know your personal target blood sugar level.  Review your treatment plan with your health care provider at each visit. This information is not intended to replace advice given to you by your health care provider. Make sure you discuss any questions you have with your health care provider. Document Revised: 04/02/2020 Document Reviewed: 04/02/2020 Elsevier Patient Education  [place].
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
## 2021-01-27 ENCOUNTER — Ambulatory Visit: Payer: Self-pay | Admitting: Nurse Practitioner

## 2021-01-27 LAB — HEMOGLOBIN A1C
Hgb A1c MFr Bld: 7.8 % of total Hgb — ABNORMAL HIGH (ref <5.7)
Mean Plasma Glucose: 177 mg/dL
eAG (mmol/L): 9.8 mmol/L

## 2021-01-27 LAB — LIPID PANEL
Cholesterol: 107 mg/dL (ref <200)
HDL: 37 mg/dL — ABNORMAL LOW (ref >=40)
LDL Cholesterol (Calc): 40 mg/dL (calc)
Non-HDL Cholesterol (Calc): 70 mg/dL (calc) (ref <130)
Total CHOL/HDL Ratio: 2.9 (calc) (ref <5.0)
Triglycerides: 256 mg/dL — ABNORMAL HIGH (ref <150)

## 2021-01-28 ENCOUNTER — Encounter: Payer: Self-pay | Admitting: Nurse Practitioner

## 2021-01-28 DIAGNOSIS — Z87891 Personal history of nicotine dependence: Secondary | ICD-10-CM | POA: Insufficient documentation

## 2021-01-28 NOTE — Assessment & Plan Note (Signed)
Chronic.  A1c in January 2022 was 8.4%, goal is less than 7% given co morbidities.  Will recheck today.  Suspect that A1c has improved given recent average readings on blood glucose meter.  Foot exam up to date.  Needs to call for eye exam.  Urine microalbumin up to date.  Will resume statin with coupon.  Continue Metformin 750 mg XR and Farxiga 5 mg daily.  Will likely increase [place].  Continue checking daily fasting blood sugar and follow up in 3 months.

## 2021-01-28 NOTE — Assessment & Plan Note (Addendum)
Chronic.  BP well controlled in clinic today.  Continue current medication regimen with lisinopril 10 mg daily, metoprolol 25 mg daily, and Imdur 60 mg daily.  Recent CMP 2 months ago kidney function and potassium normal.  Follow up 3 months.

## 2021-01-28 NOTE — Assessment & Plan Note (Signed)
Smoked 1.5 ppd for about 40 years; quit smoking 4-5 years ago.  Would qualify for low dose CT, however with recent COVID pneumonia will hold off on this for now until chest x-ray clear.  Cost may also be an issue; patient is working on getting coverage.

## 2021-01-28 NOTE — Assessment & Plan Note (Signed)
Chronic.  Will check lipids today although patient is not fasting and has not been taking rosuvastatin due to cost.  Will give coupon card and follow up in 3 months.

## 2021-01-28 NOTE — Assessment & Plan Note (Addendum)
Chronic.  Cardiac cath with DES in 2016.  Last cath in 2018 recommended medical management.  BP well controlled today.  Will also check lipids, however patient is not fasting.  Not currently taking Crestor or Zetia due to cost. Follows with Cardiology - continue this collaboration and give Goodrx card for Crestor and Zetia and continue current medications.  Follow up in 3 months.

## 2021-01-28 NOTE — Assessment & Plan Note (Signed)
Likely osteoarthritis from accident years ago.  Given history of CAD, discouraged use of daily NSAID.  Can use topical Volatern gel and Tylenol.

## 2021-02-01 ENCOUNTER — Other Ambulatory Visit: Payer: Self-pay | Admitting: Family Medicine

## 2021-02-18 ENCOUNTER — Other Ambulatory Visit: Payer: Self-pay | Admitting: Student

## 2021-02-18 ENCOUNTER — Telehealth: Payer: Self-pay | Admitting: Nurse Practitioner

## 2021-02-18 DIAGNOSIS — R918 Other nonspecific abnormal finding of lung field: Secondary | ICD-10-CM

## 2021-02-18 NOTE — Telephone Encounter (Signed)
Spoke with pt. He will go Monday 5/16 to have the xray done.

## 2021-02-18 NOTE — Telephone Encounter (Signed)
Please let patient know it is time to have his chest x-ray repeated to be sure the area we saw last time is improving. I have put the order in and he can go [facility] at his convenience to have this done.

## 2021-02-22 ENCOUNTER — Ambulatory Visit (HOSPITAL_COMMUNITY)
Admission: RE | Admit: 2021-02-22 | Discharge: 2021-02-22 | Disposition: A | Discharge status: Home / Self Care | Payer: Self-pay | Source: Ambulatory Visit | Attending: Nurse Practitioner | Admitting: Nurse Practitioner

## 2021-02-22 DIAGNOSIS — R918 Other nonspecific abnormal finding of lung field: Secondary | ICD-10-CM | POA: Insufficient documentation

## 2021-02-23 ENCOUNTER — Telehealth: Payer: Self-pay

## 2021-03-19 NOTE — Telephone Encounter (Signed)
Error

## 2021-05-03 ENCOUNTER — Ambulatory Visit (INDEPENDENT_AMBULATORY_CARE_PROVIDER_SITE_OTHER): Payer: Self-pay | Admitting: Nurse Practitioner

## 2021-05-03 ENCOUNTER — Encounter: Payer: Self-pay | Admitting: Nurse Practitioner

## 2021-05-03 ENCOUNTER — Other Ambulatory Visit: Payer: Self-pay

## 2021-05-03 VITALS — BP 140/88 | HR 73 | Temp 97.9°F | Ht 66.0 in | Wt 200.6 lb

## 2021-05-03 DIAGNOSIS — E1165 Type 2 diabetes mellitus with hyperglycemia: Secondary | ICD-10-CM

## 2021-05-03 DIAGNOSIS — E785 Hyperlipidemia, unspecified: Secondary | ICD-10-CM

## 2021-05-03 DIAGNOSIS — I10 Essential (primary) hypertension: Secondary | ICD-10-CM

## 2021-05-03 DIAGNOSIS — E1169 Type 2 diabetes mellitus with other specified complication: Secondary | ICD-10-CM

## 2021-05-03 DIAGNOSIS — I2510 Atherosclerotic heart disease of native coronary artery without angina pectoris: Secondary | ICD-10-CM

## 2021-05-03 DIAGNOSIS — I252 Old myocardial infarction: Secondary | ICD-10-CM
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
## 2021-05-05 ENCOUNTER — Encounter: Payer: Self-pay | Admitting: Nurse Practitioner

## 2021-05-05 NOTE — Assessment & Plan Note (Signed)
S/p stenting in 2016 and repeat cath in 2018 - managed medically.  Follows with Cardiology yearly.  Previously on aspirin, Imdur, metoprolol XL, high intensity statin and zetia.  Good Rx card given - if unable to afford from private pharmacy, may be able to afford 90 day supply of medications from [place] on 4 dollar list.  Stressed importance of these medications in meantime while waiting on disability/insurance.  Will hold off on checking blood work today, last LDL in April 2022 was 9.  BP is acceptable today given he is not taking medication.  Encouraged checking BP at home, low salt, low fat diet.  With any new onset chest pain or shortness of breath, go to ER.  Follow up in 2 months.

## 2021-05-05 NOTE — Assessment & Plan Note (Addendum)
S/p stenting in 2016 and repeat cath in 2018 - managed medically.  Follows with Cardiology yearly.  Previously on aspirin, Imdur, metoprolol XL, high intensity statin and zetia.  Good Rx card given - if unable to afford from private pharmacy, may be able to afford 90 day supply of medications from [place] on 4 dollar list.  Stressed importance of these medications in meantime while waiting on disability/insurance.  Will hold off on checking blood work today, last LDL in April 2022 was 8.  BP is acceptable today given he is not taking medication.  Encouraged checking BP at home, low salt, low fat diet.  With any new onset chest pain or shortness of breath, go to ER.  Follow up in 2 months.

## 2021-05-05 NOTE — Assessment & Plan Note (Signed)
Chronic.  BP is acceptable today given he has not been taking medication.  Ideally, BP goal is less than 130/80.  Good Rx card given for medications including lisinopril and metoprolol.  Encouraged checking BP at home and notify us with readings elevated above 160/90.  With any chest pain, shortness of breath, dizziness, go to ER.

## 2021-05-05 NOTE — Assessment & Plan Note (Signed)
Chronic.  Last A1c in April 2022 was improved to 7.8%.  He has been working hard on dietary and lifestyle and continues to lose weight.  However, since he has not been taking Metformin XR or Farxiga for the past month, we will hold off on rechecking A1c today.  Continue checking fasting blood sugars at home and avoid simple sugars and carbohydrates in diet.  Samples of Farxiga given today.  Good RX card also given to help with affordability.  Follow up in 2 months.

## 2021-05-05 NOTE — Assessment & Plan Note (Signed)
Chronic.  S/p MI in 2016.  Last LDL in April 2022 was 40.  At that time, he was taking rosuvastatin and Zetia, however he has since stopped taking these medications due to cost and no income.  I encouraged him to use GoodRx coupon card to help with affordability.  Will plan to recheck lipids in ~2 months.

## 2021-05-21 ENCOUNTER — Telehealth: Payer: Self-pay | Admitting: Nurse Practitioner

## 2021-05-21 NOTE — Telephone Encounter (Signed)
Patient's wife called in requesting samples of zantac 360 and farxiga 10 mg she states we gave patient some at last visit.  CB# [phone]

## 2021-05-21 NOTE — Telephone Encounter (Signed)
Samples placed at front desk and patient wife made aware to pick up.

## 2021-06-29 ENCOUNTER — Telehealth: Payer: Self-pay

## 2021-06-29 NOTE — Telephone Encounter (Signed)
Pt spouse called in inquiring if this office had any samples of Farxiga and Zantac. Please call if we have any, and are available to pick up.   Cb#: [phone]

## 2021-06-30 NOTE — Telephone Encounter (Signed)
Samples placed at front desk for pick up.   Call placed to patient and patient made aware.

## 2021-06-30 NOTE — Telephone Encounter (Signed)
Call placed to patient. LMTRC.  

## 2021-08-11 ENCOUNTER — Telehealth: Payer: Self-pay

## 2021-08-11 NOTE — Telephone Encounter (Signed)
Pt's wife called in wanting to know if the office had any samples of the med that this pt takes for antacid. Please call if there are any samples available.   Cb#: [phone]

## 2021-08-11 NOTE — Telephone Encounter (Signed)
Samples placed at front desk for patient.   Call placed to patient and patient made aware.

## 2021-09-14 ENCOUNTER — Telehealth: Payer: Self-pay

## 2021-09-14 NOTE — Telephone Encounter (Signed)
Pt's wife called in asking if office had any samples of Zantac and Farxiga for this pt. Please call to pick up if there are any available.  Cb#: [phone]

## 2021-09-14 NOTE — Telephone Encounter (Signed)
Samples up front for pick up patient aware

## 2021-10-05 ENCOUNTER — Ambulatory Visit: Payer: Self-pay | Admitting: Internal Medicine

## 2021-10-05 ENCOUNTER — Other Ambulatory Visit: Payer: Self-pay

## 2021-10-05 ENCOUNTER — Ambulatory Visit (INDEPENDENT_AMBULATORY_CARE_PROVIDER_SITE_OTHER): Payer: Self-pay | Admitting: Internal Medicine

## 2021-10-05 ENCOUNTER — Encounter: Payer: Self-pay | Admitting: Internal Medicine

## 2021-10-05 VITALS — BP 110/64 | HR 92 | Ht 66.0 in | Wt 208.0 lb

## 2021-10-05 DIAGNOSIS — I10 Essential (primary) hypertension: Secondary | ICD-10-CM

## 2021-10-05 DIAGNOSIS — E785 Hyperlipidemia, unspecified: Secondary | ICD-10-CM

## 2021-10-05 DIAGNOSIS — E119 Type 2 diabetes mellitus without complications: Secondary | ICD-10-CM

## 2021-10-05 DIAGNOSIS — I2510 Atherosclerotic heart disease of native coronary artery without angina pectoris: Secondary | ICD-10-CM

## 2021-10-05 MED ORDER — ROSUVASTATIN CALCIUM 40 MG PO TABS: ORAL_TABLET | ORAL | 3 refills | Status: DC

## 2021-10-05 MED ORDER — EZETIMIBE 10 MG PO TABS: 10.0000 mg | ORAL_TABLET | Freq: Every day | ORAL | 3 refills | Status: DC

## 2021-10-05 MED ORDER — LISINOPRIL 10 MG PO TABS: 10.0000 mg | ORAL_TABLET | Freq: Every day | ORAL | 3 refills | Status: DC

## 2021-10-05 MED ORDER — METOPROLOL SUCCINATE ER 25 MG PO TB24: 25.0000 mg | ORAL_TABLET | Freq: Every day | ORAL | 3 refills | Status: DC
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
## 2021-10-05 NOTE — Patient Instructions (Signed)
Medication Instructions:  Your physician has recommended you make the following change in your medication:   STOP Imdur Your physician recommends that you continue on your current medications as directed. Please refer to the Current Medication list given to you today.   *If you need a refill on your cardiac medications before your next appointment, please call your pharmacy*   Lab Work: None If you have labs (blood work) drawn today and your tests are completely normal, you will receive your results only by: [place] (if you have MyChart) OR A paper copy in the mail If you have any lab test that is abnormal or we need to change your treatment, we will call you to review the results.   Testing/Procedures: None   Follow-Up: At [facility], you and your health needs are our priority.  As part of our continuing mission to provide you with exceptional heart care, we have created designated Provider Care Teams.  These Care Teams include your primary Cardiologist (physician) and Advanced Practice Providers (APPs -  Physician Assistants and Nurse Practitioners) who all work together to provide you with the care you need, when you need it.  We recommend signing up for the patient portal called "MyChart".  Sign up information is provided on this After Visit Summary.  MyChart is used to connect with patients for Virtual Visits (Telemedicine).  Patients are able to view lab/test results, encounter notes, upcoming appointments, etc.  Non-urgent messages can be sent to your provider as well.   To learn more about what you can do with MyChart, go to [email or web address].    Your next appointment:   1 year(s)  The format for your next appointment:   In Person  Provider:   Lyman Bishop, MD     Other Instructions

## 2021-12-20 ENCOUNTER — Encounter (HOSPITAL_COMMUNITY): Payer: Self-pay | Admitting: *Deleted

## 2021-12-20 ENCOUNTER — Inpatient Hospital Stay (HOSPITAL_COMMUNITY)
Admission: EM | Admit: 2021-12-20 | Discharge: 2021-12-26 | DRG: 388 | Disposition: A | Discharge status: Home Health | Payer: Self-pay | Attending: Family Medicine | Admitting: Family Medicine

## 2021-12-20 ENCOUNTER — Emergency Department (HOSPITAL_COMMUNITY): Payer: Self-pay

## 2021-12-20 ENCOUNTER — Inpatient Hospital Stay (HOSPITAL_COMMUNITY): Payer: Self-pay

## 2021-12-20 DIAGNOSIS — I252 Old myocardial infarction: Secondary | ICD-10-CM

## 2021-12-20 DIAGNOSIS — Z955 Presence of coronary angioplasty implant and graft: Secondary | ICD-10-CM

## 2021-12-20 DIAGNOSIS — K56609 Unspecified intestinal obstruction, unspecified as to partial versus complete obstruction: Secondary | ICD-10-CM

## 2021-12-20 DIAGNOSIS — F32A Depression, unspecified: Secondary | ICD-10-CM | POA: Diagnosis present

## 2021-12-20 DIAGNOSIS — I10 Essential (primary) hypertension: Secondary | ICD-10-CM | POA: Diagnosis present

## 2021-12-20 DIAGNOSIS — E1169 Type 2 diabetes mellitus with other specified complication: Secondary | ICD-10-CM

## 2021-12-20 DIAGNOSIS — Z8616 Personal history of COVID-19: Secondary | ICD-10-CM

## 2021-12-20 DIAGNOSIS — E7800 Pure hypercholesterolemia, unspecified: Secondary | ICD-10-CM | POA: Diagnosis present

## 2021-12-20 DIAGNOSIS — E1165 Type 2 diabetes mellitus with hyperglycemia: Secondary | ICD-10-CM | POA: Diagnosis present

## 2021-12-20 DIAGNOSIS — Z7984 Long term (current) use of oral hypoglycemic drugs: Secondary | ICD-10-CM

## 2021-12-20 DIAGNOSIS — Z79899 Other long term (current) drug therapy: Secondary | ICD-10-CM

## 2021-12-20 DIAGNOSIS — K5900 Constipation, unspecified: Secondary | ICD-10-CM | POA: Diagnosis present

## 2021-12-20 DIAGNOSIS — U071 COVID-19: Secondary | ICD-10-CM | POA: Diagnosis present

## 2021-12-20 DIAGNOSIS — I2510 Atherosclerotic heart disease of native coronary artery without angina pectoris: Secondary | ICD-10-CM | POA: Diagnosis present

## 2021-12-20 DIAGNOSIS — Z8639 Personal history of other endocrine, nutritional and metabolic disease: Secondary | ICD-10-CM | POA: Insufficient documentation

## 2021-12-20 DIAGNOSIS — K567 Ileus, unspecified: Principal | ICD-10-CM

## 2021-12-20 DIAGNOSIS — K56600 Partial intestinal obstruction, unspecified as to cause: Principal | ICD-10-CM

## 2021-12-20 DIAGNOSIS — M545 Low back pain, unspecified: Secondary | ICD-10-CM | POA: Diagnosis not present

## 2021-12-20 DIAGNOSIS — S335XXA Sprain of ligaments of lumbar spine, initial encounter: Secondary | ICD-10-CM | POA: Diagnosis present

## 2021-12-20 DIAGNOSIS — X58XXXA Exposure to other specified factors, initial encounter: Secondary | ICD-10-CM | POA: Diagnosis present

## 2021-12-20 DIAGNOSIS — M1990 Unspecified osteoarthritis, unspecified site: Secondary | ICD-10-CM | POA: Diagnosis present

## 2021-12-20 DIAGNOSIS — Z87891 Personal history of nicotine dependence: Secondary | ICD-10-CM

## 2021-12-20 DIAGNOSIS — Z7982 Long term (current) use of aspirin: Secondary | ICD-10-CM

## 2021-12-20 DIAGNOSIS — K219 Gastro-esophageal reflux disease without esophagitis: Secondary | ICD-10-CM | POA: Diagnosis present

## 2021-12-20 LAB — RESP PANEL BY RT-PCR (FLU A&B, COVID) ARPGX2
Influenza A by PCR: NEGATIVE
Influenza B by PCR: NEGATIVE
SARS Coronavirus 2 by RT PCR: POSITIVE — AB

## 2021-12-20 LAB — LIPASE, BLOOD: Lipase: 25 U/L (ref 11–51)

## 2021-12-20 LAB — COMPREHENSIVE METABOLIC PANEL
ALT: 88 U/L — ABNORMAL HIGH (ref 0–44)
AST: 37 U/L (ref 15–41)
Albumin: 4.7 g/dL (ref 3.5–5.0)
Alkaline Phosphatase: 81 U/L (ref 38–126)
Anion gap: 10 (ref 5–15)
BUN: 22 mg/dL — ABNORMAL HIGH (ref 6–20)
CO2: 23 mmol/L (ref 22–32)
Calcium: 9.2 mg/dL (ref 8.9–10.3)
Chloride: 105 mmol/L (ref 98–111)
Creatinine, Ser: 0.86 mg/dL (ref 0.61–1.24)
GFR, Estimated: >60 mL/min (ref >60)
Glucose, Bld: 134 mg/dL — ABNORMAL HIGH (ref 70–99)
Potassium: 4.2 mmol/L (ref 3.5–5.1)
Sodium: 138 mmol/L (ref 135–145)
Total Bilirubin: 0.4 mg/dL (ref 0.3–1.2)
Total Protein: 7.9 g/dL (ref 6.5–8.1)

## 2021-12-20 LAB — CBC
HCT: 49.7 % (ref 39.0–52.0)
Hemoglobin: 16.7 g/dL (ref 13.0–17.0)
MCH: 31.3 pg (ref 26.0–34.0)
MCHC: 33.6 g/dL (ref 30.0–36.0)
MCV: 93.1 fL (ref 80.0–100.0)
Platelets: 223 10*3/uL (ref 150–400)
RBC: 5.34 MIL/uL (ref 4.22–5.81)
RDW: 12.8 % (ref 11.5–15.5)
WBC: 7.2 10*3/uL (ref 4.0–10.5)
nRBC: 0 % (ref 0.0–0.2)

## 2021-12-20 LAB — TROPONIN I (HIGH SENSITIVITY)
Troponin I (High Sensitivity): <2 ng/L (ref <18)
Troponin I (High Sensitivity): <2 ng/L (ref <18)

## 2021-12-20 MED ORDER — SODIUM CHLORIDE 0.9 % IV BOLUS
500.0000 mL | Freq: Once | INTRAVENOUS | Status: AC
  Administered 2021-12-20: 500 mL via INTRAVENOUS

## 2021-12-20 MED ORDER — ENOXAPARIN SODIUM 40 MG/0.4ML IJ SOSY
40.0000 mg | PREFILLED_SYRINGE | Freq: Every day | INTRAMUSCULAR | Status: DC
  Administered 2021-12-21 – 2021-12-26 (×6): 40 mg via SUBCUTANEOUS
  Filled 2021-12-20 (×6): qty 0.4

## 2021-12-20 MED ORDER — BISACODYL 5 MG PO TBEC: 5.0000 mg | DELAYED_RELEASE_TABLET | Freq: Every day | ORAL | Status: DC | PRN

## 2021-12-20 MED ORDER — ACETAMINOPHEN 325 MG PO TABS: 650.0000 mg | ORAL_TABLET | Freq: Four times a day (QID) | ORAL | Status: DC | PRN

## 2021-12-20 MED ORDER — SODIUM CHLORIDE 0.9 % IV SOLN: Freq: Once | INTRAVENOUS | Status: AC

## 2021-12-20 MED ORDER — FENTANYL CITRATE PF 50 MCG/ML IJ SOSY
50.0000 ug | PREFILLED_SYRINGE | Freq: Once | INTRAMUSCULAR | Status: AC
  Administered 2021-12-20: 50 ug via INTRAVENOUS
  Filled 2021-12-20: qty 1

## 2021-12-20 MED ORDER — ONDANSETRON HCL 4 MG/2ML IJ SOLN
4.0000 mg | Freq: Once | INTRAMUSCULAR | Status: AC
  Administered 2021-12-20: 4 mg via INTRAVENOUS
  Filled 2021-12-20: qty 2

## 2021-12-20 MED ORDER — METOPROLOL TARTRATE 5 MG/5ML IV SOLN
5.0000 mg | Freq: Three times a day (TID) | INTRAVENOUS | Status: DC
  Administered 2021-12-20 – 2021-12-21 (×2): 5 mg via INTRAVENOUS
  Filled 2021-12-20 (×2): qty 5

## 2021-12-20 MED ORDER — IOHEXOL 300 MG/ML  SOLN
100.0000 mL | Freq: Once | INTRAMUSCULAR | Status: AC | PRN
  Administered 2021-12-20: 100 mL via INTRAVENOUS

## 2021-12-20 MED ORDER — ACETAMINOPHEN 650 MG RE SUPP: 650.0000 mg | Freq: Four times a day (QID) | RECTAL | Status: DC | PRN

## 2021-12-20 MED ORDER — POLYETHYLENE GLYCOL 3350 17 G PO PACK
17.0000 g | PACK | Freq: Every day | ORAL | Status: DC
  Administered 2021-12-20 – 2021-12-26 (×6): 17 g via ORAL
  Filled 2021-12-20 (×7): qty 1

## 2021-12-20 NOTE — ED Provider Notes (Incomplete)
[facility] Provider Note   CSN: [id number] Arrival date & time: 12/20/21  1426     History {Add pertinent medical, surgical, social history, OB history to HPI:1} Chief Complaint  Patient presents with   Abdominal Pain    Anthony Hensley is a 62 y.o. male.  He has a history of hypertension and NSTEMI.  Said he woke up around 3 AM with some indigestion and then began having generalized abdominal pain.  Severe in nature.  Associated with nausea.  Had 1 episode of nonbloody diarrhea.  Continues with abdominal pain.  No prior abdominal surgeries.  No fevers or chills.  No shortness of breath.  Decreased appetite.  No urinary symptoms.  The history is provided by the patient.  Abdominal Pain Pain location:  Generalized Pain quality: aching   Pain radiates to:  Does not radiate Pain severity:  Severe Onset quality:  Gradual Duration:  15 hours Timing:  Constant Progression:  Unchanged Chronicity:  New Context: not sick contacts and not trauma   Relieved by:  Nothing Worsened by:  Nothing Ineffective treatments:  Belching Associated symptoms: chest pain, diarrhea and nausea   Associated symptoms: no chills, no constipation, no cough, no dysuria, no fever, no hematemesis, no hematochezia, no hematuria, no shortness of breath, no sore throat and no vomiting   Risk factors: has not had multiple surgeries       Home Medications Prior to Admission medications   Medication Sig Start Date End Date Taking? Authorizing Provider  ascorbic acid (VITAMIN C) 500 MG tablet Take 1 tablet (500 mg total) by mouth daily. Patient not taking: Reported on 05/03/2021 11/14/20   Barton Dubois, MD  aspirin 81 MG EC tablet Take 1 tablet (81 mg total) by mouth daily. 07/14/15   Lyda Jester M, PA-C  Blood Glucose Monitoring Suppl (BLOOD GLUCOSE SYSTEM PAK) KIT Please dispense based on patient and insurance preference. Use as directed to monitor FSBS 2-3x weekly. Dx: E11.9 Patient not  taking: Reported on 05/03/2021 11/23/20   Alycia Rossetti, MD  dapagliflozin propanediol (FARXIGA) 5 MG TABS tablet Take 1 tablet (5 mg total) by mouth daily before breakfast. Patient not taking: Reported on 05/03/2021 11/30/20   [name], NP  ezetimibe (ZETIA) 10 MG tablet Take 1 tablet (10 mg total) by mouth daily. 10/05/21   Hilty, Nadean Corwin, MD  Glucose Blood (BLOOD GLUCOSE TEST STRIPS) STRP Please dispense based on patient and insurance preference. Use as directed to monitor FSBS 2-3x weekly. Dx: E11.9 Patient not taking: Reported on 05/03/2021 11/23/20   Alycia Rossetti, MD  Lancets MISC Please dispense based on patient and insurance preference. Use as directed to monitor FSBS 2-3x weekly. Dx: E11.9 Patient not taking: Reported on 05/03/2021 11/23/20   Alycia Rossetti, MD  lisinopril (ZESTRIL) 10 MG tablet Take 1 tablet (10 mg total) by mouth daily. 10/05/21 01/03/22  Pixie Casino, MD  metFORMIN (GLUCOPHAGE-XR) 750 MG 24 hr tablet Take 1 tablet by mouth daily with breakfast. Patient not taking: Reported on 05/03/2021 12/30/20   Susy Frizzle, MD  metoprolol succinate (TOPROL-XL) 25 MG 24 hr tablet Take 1 tablet (25 mg total) by mouth daily. 10/05/21   Hilty, Nadean Corwin, MD  nitroGLYCERIN (NITROSTAT) 0.4 MG SL tablet Place 1 tablet (0.4 mg total) under the tongue every 5 (five) minutes as needed for chest pain. 06/10/19 09/01/20  Herminio Commons, MD  pantoprazole (PROTONIX) 40 MG tablet TAKE 1 TABLET BY MOUTH 2  TIMES DAILY BEFORE A MEAL 01/15/21   Noemi Chapel A, NP  rosuvastatin (CRESTOR) 40 MG tablet TAKE 1 TABLET BY MOUTH EVERY DAY AT 6 P.M. 10/05/21   Pixie Casino, MD  sertraline (ZOLOFT) 50 MG tablet TAKE 1 TABLET BY MOUTH AT BEDTIME FOR MOOD Patient not taking: Reported on 05/03/2021 02/02/21   Noemi Chapel A, NP  zinc sulfate 220 (50 Zn) MG capsule Take 1 capsule (220 mg total) by mouth daily. Patient not taking: Reported on 05/03/2021 11/14/20   Barton Dubois,  MD      Allergies    Patient has no known allergies.    Review of Systems   Review of Systems  Constitutional:  Negative for chills and fever.  HENT:  Negative for sore throat.   Respiratory:  Negative for cough and shortness of breath.   Cardiovascular:  Positive for chest pain.  Gastrointestinal:  Positive for abdominal pain, diarrhea and nausea. Negative for constipation, hematemesis, hematochezia and vomiting.  Genitourinary:  Negative for dysuria and hematuria.  Skin:  Negative for rash.  Neurological:  Negative for headaches.   Physical Exam Updated Vital Signs BP (!) 145/111 (BP Location: Right Arm)    Pulse 97    Temp 98.2 F (36.8 C) (Oral)    Resp 20    Ht '5\' 6"'  (1.676 m)    Wt 93.1 kg    SpO2 97%    BMI 33.13 kg/m  Physical Exam Vitals and nursing note reviewed.  Constitutional:      General: He is not in acute distress.    Appearance: He is well-developed.  HENT:     Head: Normocephalic and atraumatic.  Eyes:     Conjunctiva/sclera: Conjunctivae normal.  Cardiovascular:     Rate and Rhythm: Normal rate and regular rhythm.     Heart sounds: No murmur heard. Pulmonary:     Effort: Pulmonary effort is normal. No respiratory distress.     Breath sounds: Normal breath sounds.  Abdominal:     General: There is distension.     Tenderness: There is generalized abdominal tenderness.     Hernia: No hernia is present.  Musculoskeletal:        General: No swelling.     Cervical back: Neck supple.  Skin:    General: Skin is warm and dry.     Capillary Refill: Capillary refill takes less than 2 seconds.  Neurological:     General: No focal deficit present.     Mental Status: He is alert.    ED Results / Procedures / Treatments   Labs (all labs ordered are listed, but only abnormal results are displayed) Labs Reviewed  COMPREHENSIVE METABOLIC PANEL - Abnormal; Notable for the following components:      Result Value   Glucose, Bld 134 (*)    BUN 22 (*)    ALT  88 (*)    All other components within normal limits  CBC  LIPASE, BLOOD  TROPONIN I (HIGH SENSITIVITY)  TROPONIN I (HIGH SENSITIVITY)    EKG EKG Interpretation  Date/Time:  Monday December 20 2021 14:49:15 EDT Ventricular Rate:  106 PR Interval:  156 QRS Duration: 98 QT Interval:  330 QTC Calculation: 438 R Axis:   67 Text Interpretation: Sinus tachycardia Incomplete right bundle branch block Borderline ECG When compared with ECG of 06-May-2020 18:49, No significant change was found Confirmed by Aletta Edouard [phone]) on 12/20/2021 3:32:48 PM  Radiology DG Chest 2 View  Result Date: 12/20/2021 CLINICAL  DATA:  Chest pain EXAM: CHEST - 2 VIEW COMPARISON:  02/22/2021 FINDINGS: The heart size and mediastinal contours are within normal limits. Both lungs are clear. The visualized skeletal structures are unremarkable. IMPRESSION: No active cardiopulmonary disease. Electronically Signed   By: Elmer Picker M.D.   On: 12/20/2021 15:12    Procedures Procedures  {Document cardiac monitor, telemetry assessment procedure when appropriate:1}  Medications Ordered in ED Medications  fentaNYL (SUBLIMAZE) injection 50 mcg (has no administration in time range)  sodium chloride 0.9 % bolus 500 mL (has no administration in time range)  ondansetron (ZOFRAN) injection 4 mg (has no administration in time range)    ED Course/ Medical Decision Making/ A&P                           Medical Decision Making Amount and/or Complexity of Data Reviewed Labs: ordered. Radiology: ordered.  Risk Prescription drug management.  This patient complains of ***; this involves an extensive number of treatment Options and is a complaint that carries with it a high risk of complications and morbidity. The differential includes ***  I ordered, reviewed and interpreted labs, which included *** I ordered medication *** and reviewed PMP when indicated. I ordered imaging studies which included *** and I  independently    visualized and interpreted imaging which showed *** Additional history obtained from *** Previous records obtained and reviewed *** I consulted *** and discussed lab and imaging findings and discussed disposition.  Cardiac monitoring reviewed, *** Social determinants considered, *** Critical Interventions: ***  After the interventions stated above, I reevaluated the patient and found *** Admission and further testing considered, ***    {Document critical care time when appropriate:1} {Document review of labs and clinical decision tools ie heart score, Chads2Vasc2 etc:1}  {Document your independent review of radiology images, and any outside records:1} {Document your discussion with family members, caretakers, and with consultants:1} {Document social determinants of health affecting pt's care:1} {Document your decision making why or why not admission, treatments were needed:1} Final Clinical Impression(s) / ED Diagnoses Final diagnoses:  None    Rx / DC Orders ED Discharge Orders     None

## 2021-12-20 NOTE — ED Triage Notes (Signed)
Abdominal pain onset 0300 today, denies nausea or vomiting ?
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
## 2021-12-21 ENCOUNTER — Inpatient Hospital Stay (HOSPITAL_COMMUNITY): Payer: Self-pay

## 2021-12-21 DIAGNOSIS — K567 Ileus, unspecified: Secondary | ICD-10-CM | POA: Diagnosis present

## 2021-12-21 LAB — GLUCOSE, CAPILLARY
Glucose-Capillary: 115 mg/dL — ABNORMAL HIGH (ref 70–99)
Glucose-Capillary: 109 mg/dL — ABNORMAL HIGH (ref 70–99)
Glucose-Capillary: 89 mg/dL (ref 70–99)

## 2021-12-21 LAB — CBC
HCT: 47.4 % (ref 39.0–52.0)
Hemoglobin: 15.5 g/dL (ref 13.0–17.0)
MCH: 30.9 pg (ref 26.0–34.0)
MCHC: 32.7 g/dL (ref 30.0–36.0)
MCV: 94.4 fL (ref 80.0–100.0)
Platelets: 187 10*3/uL (ref 150–400)
RBC: 5.02 MIL/uL (ref 4.22–5.81)
RDW: 12.9 % (ref 11.5–15.5)
WBC: 6.2 10*3/uL (ref 4.0–10.5)
nRBC: 0 % (ref 0.0–0.2)

## 2021-12-21 LAB — BASIC METABOLIC PANEL
Anion gap: 9 (ref 5–15)
BUN: 19 mg/dL (ref 6–20)
CO2: 24 mmol/L (ref 22–32)
Calcium: 8.8 mg/dL — ABNORMAL LOW (ref 8.9–10.3)
Chloride: 105 mmol/L (ref 98–111)
Creatinine, Ser: 0.79 mg/dL (ref 0.61–1.24)
GFR, Estimated: >60 mL/min (ref >60)
Glucose, Bld: 113 mg/dL — ABNORMAL HIGH (ref 70–99)
Potassium: 4.2 mmol/L (ref 3.5–5.1)
Sodium: 138 mmol/L (ref 135–145)

## 2021-12-21 LAB — HIV ANTIBODY (ROUTINE TESTING W REFLEX): HIV Screen 4th Generation wRfx: NONREACTIVE

## 2021-12-21 MED ORDER — BISACODYL 10 MG RE SUPP
10.0000 mg | Freq: Two times a day (BID) | RECTAL | Status: DC
  Administered 2021-12-21 – 2021-12-25 (×6): 10 mg via RECTAL
  Filled 2021-12-21 (×9): qty 1

## 2021-12-21 MED ORDER — ASPIRIN EC 81 MG PO TBEC
81.0000 mg | DELAYED_RELEASE_TABLET | Freq: Every day | ORAL | Status: DC
  Administered 2021-12-21 – 2021-12-26 (×6): 81 mg via ORAL
  Filled 2021-12-21 (×6): qty 1

## 2021-12-21 MED ORDER — DEXTROSE-NACL 5-0.45 % IV SOLN: INTRAVENOUS | Status: DC

## 2021-12-21 MED ORDER — HYDRALAZINE HCL 20 MG/ML IJ SOLN: 10.0000 mg | Freq: Four times a day (QID) | INTRAMUSCULAR | Status: DC | PRN

## 2021-12-21 MED ORDER — INSULIN ASPART 100 UNIT/ML IJ SOLN
0.0000 [IU] | Freq: Three times a day (TID) | INTRAMUSCULAR | Status: DC
  Administered 2021-12-22 – 2021-12-25 (×4): 1 [IU] via SUBCUTANEOUS

## 2021-12-21 MED ORDER — MORPHINE SULFATE (PF) 2 MG/ML IV SOLN
2.0000 mg | INTRAVENOUS | Status: DC | PRN
  Administered 2021-12-21 – 2021-12-23 (×5): 2 mg via INTRAVENOUS
  Filled 2021-12-21 (×5): qty 1

## 2021-12-21 MED ORDER — ROSUVASTATIN CALCIUM 20 MG PO TABS
40.0000 mg | ORAL_TABLET | Freq: Every evening | ORAL | Status: DC
Start: 2021-12-21 — End: 2021-12-21

## 2021-12-21 MED ORDER — PANTOPRAZOLE SODIUM 40 MG PO TBEC
40.0000 mg | DELAYED_RELEASE_TABLET | Freq: Every day | ORAL | Status: DC
  Administered 2021-12-21 – 2021-12-26 (×6): 40 mg via ORAL
  Filled 2021-12-21 (×6): qty 1

## 2021-12-21 MED ORDER — NIRMATRELVIR/RITONAVIR (PAXLOVID)TABLET
3.0000 | ORAL_TABLET | Freq: Two times a day (BID) | ORAL | Status: AC
Start: 2021-12-21 — End: 2021-12-26
  Administered 2021-12-21 – 2021-12-24 (×8): 3 via ORAL
  Filled 2021-12-21: qty 30

## 2021-12-21 MED ORDER — METOPROLOL SUCCINATE ER 25 MG PO TB24
25.0000 mg | ORAL_TABLET | Freq: Every day | ORAL | Status: DC
Start: 2021-12-21 — End: 2021-12-26
  Administered 2021-12-21 – 2021-12-26 (×6): 25 mg via ORAL
  Filled 2021-12-21 (×6): qty 1

## 2021-12-21 MED ORDER — INSULIN ASPART 100 UNIT/ML IJ SOLN: 0.0000 [IU] | Freq: Every day | INTRAMUSCULAR | Status: DC

## 2021-12-21 NOTE — Progress Notes (Signed)
?  Transition of Care (TOC) Screening Note ? ? ?Patient Details  ?Name: Anthony Hensley ?Date of Birth: 1961/06/13 ? ? ?Transition of Care (TOC) CM/SW Contact:    ?Boneta Lucks, RN ?Phone Number: ?12/21/2021, 11:14 AM ? ? ? ?Transition of Care Department [facility]) has reviewed patient and no TOC needs have been identified at this time. We will continue to monitor patient advancement through interdisciplinary progression rounds. If new patient transition needs arise, please place a TOC consult. ? ? ?
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
## 2021-12-21 NOTE — Progress Notes (Addendum)
?PROGRESS NOTE ? ? ? ? ?Anthony Hensley, is a 61 y.o. male, DOB - 1960-10-21, [id number] ? ?Admit date - 12/20/2021   Admitting Physician Richarda Osmond, MD ? ?Outpatient Primary MD for the patient is [name], NP ? ?LOS - 1 ? ?Chief Complaint  ?Patient presents with  ? Abdominal Pain  ?    ? ? ?Brief Narrative:  ? ?61 y.o. male with medical history significant of CAD, diverticulosis, GERD, prior MI, HLD, HTN admitted on 12/20/2021 with abdominal pain loose stools and nausea with imaging studies suggesting ileus versus partial SBO ?-Also found to be COVID-positive ? ?  ?-Assessment and Plan: ? ?1) COVID-19 infection--- no hypoxia, clinically and radiologically no pneumonia ?-Given multiple comorbid conditions and age will empirically treat with Paxlovid ? ?2)Ileus Vs pSBO--- tolerated NG tube well ?-Repeat imaging studies reviewed, discussed with general surgeon okay to remove NG tube on 12/21/2021 ?-Give Dulcolax suppository bid  ?-Okay to try sips and ice chips ?-If BM then may advance to clear liquid diet ?-Consider repeat abdominal x-rays in a.m especially if no significant BM ? ?3)DM2-A1c was previously 7.8 reflecting uncontrolled diabetes with hyperglycemia PTA ?--Repeat A1c pending ?Use Novolog/Humalog Sliding scale insulin with Accu-Cheks/Fingersticks as ordered  ?-Continue to hold [place] and metformin ? ?4)HTN--- hold lisinopril ?-Okay to restart Toprol-XL ? may use IV Hydralazine 10 mg  Every 4 hours Prn for systolic blood pressure over 170 mmhg ? ?5)CAD--no chest pain no ACS type symptoms ?-Restart aspirin,  and Toprol-XL ?-Hold Crestor while on Paxlovid ? ?- ?Prophylaxis:-Lovenox for DVT prophylaxis ?-Protonix for GI prophylaxis especially with NG tube in situ ? ? ?Disposition/Need for in-Hospital Stay- patient unable to be discharged at this time due to --- ileus versus partial SBO requiring IV fluids pending return of bowel function and tolerance of oral intake ?-Possible discharge home in  1 to 2 days if tolerating oral intake ? ?Status is: Inpatient  ? ?Disposition: The patient is from: Home ?             Anticipated d/c is to: Home ?             Anticipated d/c date is: 1 day ?             Patient currently is not medically stable to d/c. ?Barriers: Not Clinically Stable-  ? ?Code Status :  -  Code Status: Full Code  ? ?Family Communication- (patient is alert, awake and coherent)  ?-Discussed with wife at bedside ? ?DVT Prophylaxis  :   - SCDs  enoxaparin (LOVENOX) injection 40 mg Start: 12/21/21 1000 ? ? ?Lab Results  ?Component Value Date  ? PLT 187 12/21/2021  ? ? ?Inpatient Medications ? ?Scheduled Meds: ? bisacodyl  10 mg Rectal BID  ? enoxaparin (LOVENOX) injection  40 mg Subcutaneous Daily  ? metoprolol tartrate  5 mg Intravenous Q8H  ? nirmatrelvir/ritonavir EUA  3 tablet Oral BID  ? polyethylene glycol  17 g Oral Daily  ? ?Continuous Infusions: ? dextrose 5 % and 0.45% NaCl 125 mL/hr at 12/21/21 0932  ? ?PRN Meds:.acetaminophen **OR** acetaminophen, bisacodyl, morphine injection ? ? ?Anti-infectives (From admission, onward)  ? ? Start     Dose/Rate Route Frequency Ordered Stop  ? 12/21/21 1315  nirmatrelvir/ritonavir EUA (PAXLOVID) 3 tablet       ? 3 tablet Oral 2 times daily 12/21/21 1218 12/26/21 0959  ? ?  ? ?  ? ?Subjective: ?[name] today has no fevers, no emesis,  No chest pain,   ?Wife at bedside, questions answered ?-Nausea and abdominal pain improved with NG tube in situ ?-No BM,  no significant flatus ? ? ?Objective: ?Vitals:  ? 12/20/21 2220 12/21/21 0059 12/21/21 0450 12/21/21 0534  ?BP: (!) 141/104 (!) 138/91 (!) 138/96 127/90  ?Pulse: 78 80 84 66  ?Resp: '17 18  17  '$ ?Temp:      ?TempSrc:      ?SpO2: 94% 96% 96% 97%  ?Weight:      ?Height:      ? ? ?Intake/Output Summary (Last 24 hours) at 12/21/2021 1226 ?Last data filed at 12/21/2021 0500 ?Gross per 24 hour  ?Intake 740 ml  ?Output --  ?Net 740 ml  ? ?Filed Weights  ? 12/20/21 1443  ?Weight: 93.1 kg  ? ? ?Physical  Exam ? ?Gen:- Awake Alert, no acute distress ?HEENT:- [place].AT, No sclera icterus ?Nose-NG tube-without significant drainage ?Neck-Supple Neck,No JVD,.  ?Lungs-  CTAB , fair symmetrical air movement ?CV- S1, S2 normal, regular  ?Abd-diminished bowel sounds, generalized abdominal discomfort with palpation, no rebound or guarding, abdominal distention improved,  ?--extremity/Skin:- No  edema, pedal pulses present  ?Psych-affect is appropriate, oriented x3 ?Neuro-no new focal deficits, no tremors ? ?Data Reviewed: I have personally reviewed following labs and imaging studies ? ?CBC: ?Recent Labs  ?Lab 12/20/21 ?1546 12/21/21 ?0432  ?WBC 7.2 6.2  ?HGB 16.7 15.5  ?HCT 49.7 47.4  ?MCV 93.1 94.4  ?PLT 223 187  ? ?Basic Metabolic Panel: ?Recent Labs  ?Lab 12/20/21 ?1546 12/21/21 ?0432  ?NA 138 138  ?K 4.2 4.2  ?CL 105 105  ?CO2 23 24  ?GLUCOSE 134* 113*  ?BUN 22* 19  ?CREATININE 0.86 0.79  ?CALCIUM 9.2 8.8*  ? ?GFR: ?Estimated Creatinine Clearance: 107.5 mL/min (by C-G formula based on SCr of 0.79 mg/dL). ?Liver Function Tests: ?Recent Labs  ?Lab 12/20/21 ?1546  ?AST 37  ?ALT 88*  ?ALKPHOS 81  ?BILITOT 0.4  ?PROT 7.9  ?ALBUMIN 4.7  ? ?Cardiac Enzymes: ?No results for input(s): CKTOTAL, CKMB, CKMBINDEX, TROPONINI in the last 168 hours. ?BNP (last 3 results) ?No results for input(s): PROBNP in the last 8760 hours. ?HbA1C: ?No results for input(s): HGBA1C in the last 72 hours. ?Sepsis Labs: ?'@LABRCNTIP'$ (procalcitonin:4,lacticidven:4) ?) ?Recent Results (from the past 240 hour(s))  ?Resp Panel by RT-PCR (Flu A&B, Covid) Nasopharyngeal Swab     Status: Abnormal  ? Collection Time: 12/20/21  7:18 PM  ? Specimen: Nasopharyngeal Swab; Nasopharyngeal(NP) swabs in vial transport medium  ?Result Value Ref Range Status  ? SARS Coronavirus 2 by RT PCR POSITIVE (A) NEGATIVE Final  ?  Comment: (NOTE) ?SARS-CoV-2 target nucleic acids are DETECTED. ? ?The SARS-CoV-2 RNA is generally detectable in upper respiratory ?specimens during the acute  phase of infection. Positive results are ?indicative of the presence of the identified virus, but do not rule ?out bacterial infection or co-infection with other pathogens not ?detected by the test. Clinical correlation with patient history and ?other diagnostic information is necessary to determine patient ?infection status. The expected result is Negative. ? ?Fact Sheet for Patients: ?EntrepreneurPulse.com.au ? ?Fact Sheet for Healthcare Providers: ?IncredibleEmployment.be ? ?This test is not yet approved or cleared by the [place] FDA and  ?has been authorized for detection and/or diagnosis of SARS-CoV-2 by ?FDA under an Emergency Use Authorization (EUA).  This EUA will ?remain in effect (meaning this test can be used) for the duration of  ?the COVID-19 declaration under Section 564(b)(1) of the A ct, 21 ?  U.S.C. section 360bbb-3(b)(1), unless the authorization is ?terminated or revoked sooner. ? ?  ? Influenza A by PCR NEGATIVE NEGATIVE Final  ? Influenza B by PCR NEGATIVE NEGATIVE Final  ?  Comment: (NOTE) ?The Xpert Xpress SARS-CoV-2/FLU/RSV plus assay is intended as an aid ?in the diagnosis of influenza from Nasopharyngeal swab specimens and ?should not be used as a sole basis for treatment. Nasal washings and ?aspirates are unacceptable for Xpert Xpress SARS-CoV-2/FLU/RSV ?testing. ? ?Fact Sheet for Patients: ?EntrepreneurPulse.com.au ? ?Fact Sheet for Healthcare Providers: ?IncredibleEmployment.be ? ?This test is not yet approved or cleared by the [place] FDA and ?has been authorized for detection and/or diagnosis of SARS-CoV-2 by ?FDA under an Emergency Use Authorization (EUA). This EUA will remain ?in effect (meaning this test can be used) for the duration of the ?COVID-19 declaration under Section 564(b)(1) of the Act, 21 U.S.C. ?section 360bbb-3(b)(1), unless the authorization is terminated or ?revoked. ? ?Performed at [facility], [address], [place], [place] [address] ?  ?  ? ? ?Radiology Studies: ?DG Chest 2 View ? ?Result Date: 12/20/2021 ?CLINICAL DATA:  Chest pain EXAM: CHEST - 2 VIEW COMPARISON:  02/22/2021 FINDINGS: The he

## 2021-12-22 ENCOUNTER — Inpatient Hospital Stay (HOSPITAL_COMMUNITY): Payer: Self-pay

## 2021-12-22 DIAGNOSIS — K567 Ileus, unspecified: Secondary | ICD-10-CM

## 2021-12-22 DIAGNOSIS — U071 COVID-19: Secondary | ICD-10-CM | POA: Diagnosis present

## 2021-12-22 LAB — GLUCOSE, CAPILLARY
Glucose-Capillary: 131 mg/dL — ABNORMAL HIGH (ref 70–99)
Glucose-Capillary: 135 mg/dL — ABNORMAL HIGH (ref 70–99)
Glucose-Capillary: 89 mg/dL (ref 70–99)
Glucose-Capillary: 74 mg/dL (ref 70–99)

## 2021-12-22 LAB — HEMOGLOBIN A1C
Hgb A1c MFr Bld: 6.4 % — ABNORMAL HIGH (ref 4.8–5.6)
Mean Plasma Glucose: 137 mg/dL

## 2021-12-22 NOTE — Progress Notes (Signed)
?PROGRESS NOTE ? ? ?Anthony Hensley  GQQ:[id number] DOB: November 11, 1960 DOA: 12/20/2021 ?PCP: No primary care provider on file.  ? ?Chief Complaint  ?Patient presents with  ? Abdominal Pain  ? ?Level of care: Med-Surg ? ?Brief Admission History:  ?61 y.o. male with medical history significant of CAD, diverticulosis, GERD, prior MI, HLD, HTN. ?  ?Patient presents from home on 12/20/21 with chief complaint of acute abdominal pain.  He states that he had some mild discomfort yesterday but it acutely worsened early this morning.  He felt like he had to throw up but did not vomit at all.  He was not able to tolerate a diet today.  He had 1 episode of nonbloody, watery diarrhea throughout the day which caused his pain to worsen.  He endorses tenesmus throughout the day but not able to have any further bowel movements. ?He took over-the-counter indigestion medication without any relief of pain. ?Denies vomiting, fever, chest pain. ?Endorses diffuse abdominal pain and distention. ?Negative for history of abdominal surgeries at any time. ?  ?In the ED: ?Significant findings include normal CBC, negative troponins, normal electrolytes. ?CT abdomen positive for partial small bowel obstruction. ?Initial vital signs were stable with heart rate 97, respirations 20, blood pressure 145/111, O2 saturations 97% on room air. ?  ?He received fentanyl, Zofran, 500 mL normal saline bolus.  General surgery was consulted and recommended NG tube.  ?  ?Assessment and Plan: ?* Small bowel obstruction ([place]) ?He is clinically improving.  Continue clears.  ? ?Ileus ([place]) ?He is clinically improving.  Continue clears. ? ?COVID-19 ?He is asymptomatic, being treated with paxlovid.  ? ?CAD (coronary artery disease) ?Currently no CP symptoms ?Resumed home aspirin, toprol.  ? ?Benign essential HTN ?Restarted home metoprolol, temporarily holding home lisinopril.  ? ?Type 2 diabetes mellitus with hyperglycemia, without long-term current use of insulin  ([place]) ?Uncontrolled as evidenced by A1c 7.8% ?Continue SSI coverage, frequent SBG monitoring.  ?Holding home oral meds temporarily.  ? ?DVT prophylaxis: SCD, enoxaparin ?Code Status: Full  ?Family Communication: bedside update 3/15 ?Disposition: Status is: Inpatient ?Remains inpatient appropriate because: advancing diet  ?  ?Consultants:  ?Surgery  ?Procedures:  ? ?Antimicrobials:  ?  ?Subjective: ?No shortness of breath, he had 2 BMs  yesterday and  a lot of flatulence.  ?Objective: ?Vitals:  ? 12/21/21 0450 12/21/21 0534 12/21/21 1407 12/22/21 1239  ?BP: (!) 138/96 127/90 126/80 (!) 129/91  ?Pulse: 84 66 68 62  ?Resp:  '17 17 18  '$ ?Temp:   97.6 ?F (36.4 ?C)   ?TempSrc:   Oral   ?SpO2: 96% 97% 97% 98%  ?Weight:      ?Height:      ? ? ?Intake/Output Summary (Last 24 hours) at 12/22/2021 1816 ?Last data filed at 12/22/2021 1249 ?Gross per 24 hour  ?Intake 2237.38 ml  ?Output --  ?Net 2237.38 ml  ? ?Filed Weights  ? 12/20/21 1443  ?Weight: 93.1 kg  ? ?Examination: ? ?General exam: Appears calm and comfortable  ?Respiratory system: Clear to auscultation. Respiratory effort normal. ?Cardiovascular system: normal S1 & S2 heard. No JVD, murmurs, rubs, gallops or clicks. No pedal edema. ?Gastrointestinal system: Abdomen is nondistended, soft and nontender. No organomegaly or masses felt. Normal bowel sounds heard. ?Central nervous system: Alert and oriented. No focal neurological deficits. ?Extremities: Symmetric 5 x 5 power. ?Skin: No rashes, lesions or ulcers. ?Psychiatry: Judgement and insight appear normal. Mood & affect appropriate.  ? ?Data Reviewed: I have personally reviewed following labs  and imaging studies ? ?CBC: ?Recent Labs  ?Lab 12/20/21 ?1546 12/21/21 ?0432  ?WBC 7.2 6.2  ?HGB 16.7 15.5  ?HCT 49.7 47.4  ?MCV 93.1 94.4  ?PLT 223 187  ? ? ?Basic Metabolic Panel: ?Recent Labs  ?Lab 12/20/21 ?1546 12/21/21 ?0432  ?NA 138 138  ?K 4.2 4.2  ?CL 105 105  ?CO2 23 24  ?GLUCOSE 134* 113*  ?BUN 22* 19  ?CREATININE 0.86  0.79  ?CALCIUM 9.2 8.8*  ? ? ?CBG: ?Recent Labs  ?Lab 12/21/21 ?1636 12/21/21 ?2155 12/22/21 ?0730 12/22/21 ?1118 12/22/21 ?1607  ?GLUCAP 109* 89 131* 135* 89  ? ? ?Recent Results (from the past 240 hour(s))  ?Resp Panel by RT-PCR (Flu A&B, Covid) Nasopharyngeal Swab     Status: Abnormal  ? Collection Time: 12/20/21  7:18 PM  ? Specimen: Nasopharyngeal Swab; Nasopharyngeal(NP) swabs in vial transport medium  ?Result Value Ref Range Status  ? SARS Coronavirus 2 by RT PCR POSITIVE (A) NEGATIVE Final  ?  Comment: (NOTE) ?SARS-CoV-2 target nucleic acids are DETECTED. ? ?The SARS-CoV-2 RNA is generally detectable in upper respiratory ?specimens during the acute phase of infection. Positive results are ?indicative of the presence of the identified virus, but do not rule ?out bacterial infection or co-infection with other pathogens not ?detected by the test. Clinical correlation with patient history and ?other diagnostic information is necessary to determine patient ?infection status. The expected result is Negative. ? ?Fact Sheet for Patients: ?EntrepreneurPulse.com.au ? ?Fact Sheet for Healthcare Providers: ?IncredibleEmployment.be ? ?This test is not yet approved or cleared by the [place] FDA and  ?has been authorized for detection and/or diagnosis of SARS-CoV-2 by ?FDA under an Emergency Use Authorization (EUA).  This EUA will ?remain in effect (meaning this test can be used) for the duration of  ?the COVID-19 declaration under Section 564(b)(1) of the A ct, 21 ?U.S.C. section 360bbb-3(b)(1), unless the authorization is ?terminated or revoked sooner. ? ?  ? Influenza A by PCR NEGATIVE NEGATIVE Final  ? Influenza B by PCR NEGATIVE NEGATIVE Final  ?  Comment: (NOTE) ?The Xpert Xpress SARS-CoV-2/FLU/RSV plus assay is intended as an aid ?in the diagnosis of influenza from Nasopharyngeal swab specimens and ?should not be used as a sole basis for treatment. Nasal washings  and ?aspirates are unacceptable for Xpert Xpress SARS-CoV-2/FLU/RSV ?testing. ? ?Fact Sheet for Patients: ?EntrepreneurPulse.com.au ? ?Fact Sheet for Healthcare Providers: ?IncredibleEmployment.be ? ?This test is not yet approved or cleared by the [place] FDA and ?has been authorized for detection and/or diagnosis of SARS-CoV-2 by ?FDA under an Emergency Use Authorization (EUA). This EUA will remain ?in effect (meaning this test can be used) for the duration of the ?COVID-19 declaration under Section 564(b)(1) of the Act, 21 U.S.C. ?section 360bbb-3(b)(1), unless the authorization is terminated or ?revoked. ? ?Performed at [facility], [address], [place], [place] [address] ?  ?  ? ?Radiology Studies: ?DG Abd 1 View ? ?Result Date: 12/21/2021 ?CLINICAL DATA:  Small bowel obstruction EXAM: ABDOMEN - 1 VIEW COMPARISON:  None. FINDINGS: Enteric tube tip is in the stomach with side port near the GE junction. Mildly prominent air-filled loops of small bowel with gas seen in the colon. Partially visualized lung bases demonstrate left basilar atelectasis. IMPRESSION: 1. Enteric tube tip is in the stomach with side port at the GE junction, recommend advancement for optimal positioning. 2. Mildly prominent air-filled loops of small bowel with gas seen in the colon, compatible with partial small bowel obstruction. Electronically Signed  By: Yetta Glassman M.D.   On: 12/21/2021 11:58  ? ?DG Chest Portable 1 View ? ?Result Date: 12/20/2021 ?CLINICAL DATA:  NG placement. EXAM: PORTABLE CHEST 1 VIEW COMPARISON:  Chest radiograph dated 12/20/2021. FINDINGS: Enteric tube with tip in the proximal stomach. Minimal left lung base atelectasis. No focal consolidation, pleural effusion, pneumothorax. The cardiac silhouette is within normal limits. No acute osseous pathology. IMPRESSION: Enteric tube with tip in the proximal stomach. Electronically Signed   By: Anner Crete M.D.   On:  12/20/2021 20:00  ? ?DG ABD ACUTE 2+V W 1V CHEST ? ?Result Date: 12/22/2021 ?CLINICAL DATA:  Ileus versus partial small bowel obstruction EXAM: DG ABDOMEN ACUTE WITH 1 VIEW CHEST COMPARISON:  Radiograph 12/21/2021 FINDINGS: Darreld Mclean

## 2021-12-22 NOTE — Progress Notes (Signed)
?Subjective: ?Patient denies any nausea or vomiting.  Did have a limited bowel movement with Dulcolax suppository.  He is passing some flatus. ? ?Objective: ?Vital signs in last 24 hours: ?Temp:  [97.6 ?F (36.4 ?C)] 97.6 ?F (36.4 ?C) (03/14 1407) ?Pulse Rate:  [68] 68 (03/14 1407) ?Resp:  [17] 17 (03/14 1407) ?BP: (126)/(80) 126/80 (03/14 1407) ?SpO2:  [97 %] 97 % (03/14 1407) ?Last BM Date : 12/21/21 ? ?Intake/Output from previous day: ?03/14 0701 - 03/15 0700 ?In: 1537.4 [I.V.:1537.4] ?Out: -  ?Intake/Output this shift: ?No intake/output data recorded. ? ?General appearance: alert, cooperative, and no distress ?GI: Abdomen soft but still slightly distended. ? ?Lab Results:  ?Recent Labs  ?  12/20/21 ?1546 12/21/21 ?0432  ?WBC 7.2 6.2  ?HGB 16.7 15.5  ?HCT 49.7 47.4  ?PLT 223 187  ? ?BMET ?Recent Labs  ?  12/20/21 ?1546 12/21/21 ?0432  ?NA 138 138  ?K 4.2 4.2  ?CL 105 105  ?CO2 23 24  ?GLUCOSE 134* 113*  ?BUN 22* 19  ?CREATININE 0.86 0.79  ?CALCIUM 9.2 8.8*  ? ?PT/INR ?No results for input(s): LABPROT, INR in the last 72 hours. ? ?Studies/Results: ?DG Chest 2 View ? ?Result Date: 12/20/2021 ?CLINICAL DATA:  Chest pain EXAM: CHEST - 2 VIEW COMPARISON:  02/22/2021 FINDINGS: The heart size and mediastinal contours are within normal limits. Both lungs are clear. The visualized skeletal structures are unremarkable. IMPRESSION: No active cardiopulmonary disease. Electronically Signed   By: Elmer Picker M.D.   On: 12/20/2021 15:12  ? ?DG Abd 1 View ? ?Result Date: 12/21/2021 ?CLINICAL DATA:  Small bowel obstruction EXAM: ABDOMEN - 1 VIEW COMPARISON:  None. FINDINGS: Enteric tube tip is in the stomach with side port near the GE junction. Mildly prominent air-filled loops of small bowel with gas seen in the colon. Partially visualized lung bases demonstrate left basilar atelectasis. IMPRESSION: 1. Enteric tube tip is in the stomach with side port at the GE junction, recommend advancement for optimal positioning. 2.  Mildly prominent air-filled loops of small bowel with gas seen in the colon, compatible with partial small bowel obstruction. Electronically Signed   By: Yetta Glassman M.D.   On: 12/21/2021 11:58  ? ?CT Abdomen Pelvis W Contrast ? ?Result Date: 12/20/2021 ?CLINICAL DATA:  Abdominal pain, acute, nonlocalized distended EXAM: CT ABDOMEN AND PELVIS WITH CONTRAST TECHNIQUE: Multidetector CT imaging of the abdomen and pelvis was performed using the standard protocol following bolus administration of intravenous contrast. RADIATION DOSE REDUCTION: This exam was performed according to the departmental dose-optimization program which includes automated exposure control, adjustment of the mA and/or kV according to patient size and/or use of iterative reconstruction technique. CONTRAST:  129m OMNIPAQUE IOHEXOL 300 MG/ML  SOLN COMPARISON:  None. FINDINGS: Lower chest: No acute abnormality. Scattered moderate calcifications in the visualized coronary arteries. Hepatobiliary: No focal hepatic abnormality. Gallbladder unremarkable. Pancreas: No focal abnormality or ductal dilatation. Spleen: No focal abnormality.  Normal size. Adrenals/Urinary Tract: No adrenal abnormality. No focal renal abnormality. No stones or hydronephrosis. Urinary bladder is unremarkable. Stomach/Bowel: Large bowel is decompressed, grossly unremarkable. Normal appendix. Small bowel loops are dilated into the pelvis. Distal small bowel loops are decompressed. Exact transition is not visualized but findings compatible with partial small bowel obstruction. Stomach grossly unremarkable. Vascular/Lymphatic: Aortic atherosclerosis. No evidence of aneurysm or adenopathy. Reproductive: No visible focal abnormality. Other: No free fluid or free air. Musculoskeletal: No acute bony abnormality. IMPRESSION: Partial small bowel obstruction. Exact transition and cause not visualized but  likely in the mid to distal small bowel. Aortic atherosclerosis, coronary artery  disease. Electronically Signed   By: Rolm Baptise M.D.   On: 12/20/2021 17:49  ? ?DG Chest Portable 1 View ? ?Result Date: 12/20/2021 ?CLINICAL DATA:  NG placement. EXAM: PORTABLE CHEST 1 VIEW COMPARISON:  Chest radiograph dated 12/20/2021. FINDINGS: Enteric tube with tip in the proximal stomach. Minimal left lung base atelectasis. No focal consolidation, pleural effusion, pneumothorax. The cardiac silhouette is within normal limits. No acute osseous pathology. IMPRESSION: Enteric tube with tip in the proximal stomach. Electronically Signed   By: Anner Crete M.D.   On: 12/20/2021 20:00  ? ?DG ABD ACUTE 2+V W 1V CHEST ? ?Result Date: 12/22/2021 ?CLINICAL DATA:  Ileus versus partial small bowel obstruction EXAM: DG ABDOMEN ACUTE WITH 1 VIEW CHEST COMPARISON:  Radiograph 12/21/2021 FINDINGS: Nasogastric tube has been removed. The cardiomediastinal silhouette is within normal limits. There is no focal airspace consolidation. No pleural effusion. No pneumothorax. There is no evidence of free intraperitoneal gas. There is a dilated loop of small bowel in the upper right hemiabdomen. There is gas throughout the colon and mild colonic stool burden. No acute osseous abnormality. IMPRESSION: Dilated loops of small bowel in the upper right hemiabdomen, could be partial small bowel obstruction or ileus. No acute cardiopulmonary disease. Electronically Signed   By: Maurine Simmering M.D.   On: 12/22/2021 08:12   ? ?Anti-infectives: ?Anti-infectives (From admission, onward)  ? ? Start     Dose/Rate Route Frequency Ordered Stop  ? 12/21/21 1315  nirmatrelvir/ritonavir EUA (PAXLOVID) 3 tablet       ? 3 tablet Oral 2 times daily 12/21/21 1218 12/26/21 0959  ? ?  ? ? ?Assessment/Plan: ?Impression: Partial small bowel obstruction versus ileus.  This appears to be resolving. ?Plan: Continue current management.  Continue clear liquid diet.  No need for acute surgical invention at this time. ? LOS: 2 days  ? ? ?Aviva Signs ?12/22/2021  ?

## 2021-12-22 NOTE — Assessment & Plan Note (Signed)
Currently no CP symptoms ?Resumed home aspirin, toprol.  ?

## 2021-12-22 NOTE — Progress Notes (Signed)
?Subjective: ?Small bowel movement with suppository.  Some flatus present.  No  nausea, vomiting.  Does feel better overall. ? ?Objective: ?Vital signs in last 24 hours: ?Temp:  [97.6 ?F (36.4 ?C)] 97.6 ?F (36.4 ?C) (03/14 1407) ?Pulse Rate:  [68] 68 (03/14 1407) ?Resp:  [17] 17 (03/14 1407) ?BP: (126)/(80) 126/80 (03/14 1407) ?SpO2:  [97 %] 97 % (03/14 1407) ?Last BM Date : 12/21/21 ? ?Intake/Output from previous day: ?03/14 0701 - 03/15 0700 ?In: 1537.4 [I.V.:1537.4] ?Out: -  ?Intake/Output this shift: ?No intake/output data recorded. ? ?General appearance: alert, cooperative, and no distress ?GI: Soft, but distended.  Occ BS appreciated.   ? ?Lab Results:  ?Recent Labs  ?  12/20/21 ?1546 12/21/21 ?0432  ?WBC 7.2 6.2  ?HGB 16.7 15.5  ?HCT 49.7 47.4  ?PLT 223 187  ? ?BMET ?Recent Labs  ?  12/20/21 ?1546 12/21/21 ?0432  ?NA 138 138  ?K 4.2 4.2  ?CL 105 105  ?CO2 23 24  ?GLUCOSE 134* 113*  ?BUN 22* 19  ?CREATININE 0.86 0.79  ?CALCIUM 9.2 8.8*  ? ?PT/INR ?No results for input(s): LABPROT, INR in the last 72 hours. ? ?Studies/Results: ?DG Chest 2 View ? ?Result Date: 12/20/2021 ?CLINICAL DATA:  Chest pain EXAM: CHEST - 2 VIEW COMPARISON:  02/22/2021 FINDINGS: The heart size and mediastinal contours are within normal limits. Both lungs are clear. The visualized skeletal structures are unremarkable. IMPRESSION: No active cardiopulmonary disease. Electronically Signed   By: Elmer Picker M.D.   On: 12/20/2021 15:12  ? ?DG Abd 1 View ? ?Result Date: 12/21/2021 ?CLINICAL DATA:  Small bowel obstruction EXAM: ABDOMEN - 1 VIEW COMPARISON:  None. FINDINGS: Enteric tube tip is in the stomach with side port near the GE junction. Mildly prominent air-filled loops of small bowel with gas seen in the colon. Partially visualized lung bases demonstrate left basilar atelectasis. IMPRESSION: 1. Enteric tube tip is in the stomach with side port at the GE junction, recommend advancement for optimal positioning. 2. Mildly prominent  air-filled loops of small bowel with gas seen in the colon, compatible with partial small bowel obstruction. Electronically Signed   By: Yetta Glassman M.D.   On: 12/21/2021 11:58  ? ?CT Abdomen Pelvis W Contrast ? ?Result Date: 12/20/2021 ?CLINICAL DATA:  Abdominal pain, acute, nonlocalized distended EXAM: CT ABDOMEN AND PELVIS WITH CONTRAST TECHNIQUE: Multidetector CT imaging of the abdomen and pelvis was performed using the standard protocol following bolus administration of intravenous contrast. RADIATION DOSE REDUCTION: This exam was performed according to the departmental dose-optimization program which includes automated exposure control, adjustment of the mA and/or kV according to patient size and/or use of iterative reconstruction technique. CONTRAST:  168m OMNIPAQUE IOHEXOL 300 MG/ML  SOLN COMPARISON:  None. FINDINGS: Lower chest: No acute abnormality. Scattered moderate calcifications in the visualized coronary arteries. Hepatobiliary: No focal hepatic abnormality. Gallbladder unremarkable. Pancreas: No focal abnormality or ductal dilatation. Spleen: No focal abnormality.  Normal size. Adrenals/Urinary Tract: No adrenal abnormality. No focal renal abnormality. No stones or hydronephrosis. Urinary bladder is unremarkable. Stomach/Bowel: Large bowel is decompressed, grossly unremarkable. Normal appendix. Small bowel loops are dilated into the pelvis. Distal small bowel loops are decompressed. Exact transition is not visualized but findings compatible with partial small bowel obstruction. Stomach grossly unremarkable. Vascular/Lymphatic: Aortic atherosclerosis. No evidence of aneurysm or adenopathy. Reproductive: No visible focal abnormality. Other: No free fluid or free air. Musculoskeletal: No acute bony abnormality. IMPRESSION: Partial small bowel obstruction. Exact transition and cause not visualized but  likely in the mid to distal small bowel. Aortic atherosclerosis, coronary artery disease.  Electronically Signed   By: Rolm Baptise M.D.   On: 12/20/2021 17:49  ? ?DG Chest Portable 1 View ? ?Result Date: 12/20/2021 ?CLINICAL DATA:  NG placement. EXAM: PORTABLE CHEST 1 VIEW COMPARISON:  Chest radiograph dated 12/20/2021. FINDINGS: Enteric tube with tip in the proximal stomach. Minimal left lung base atelectasis. No focal consolidation, pleural effusion, pneumothorax. The cardiac silhouette is within normal limits. No acute osseous pathology. IMPRESSION: Enteric tube with tip in the proximal stomach. Electronically Signed   By: Anner Crete M.D.   On: 12/20/2021 20:00  ? ?DG ABD ACUTE 2+V W 1V CHEST ? ?Result Date: 12/22/2021 ?CLINICAL DATA:  Ileus versus partial small bowel obstruction EXAM: DG ABDOMEN ACUTE WITH 1 VIEW CHEST COMPARISON:  Radiograph 12/21/2021 FINDINGS: Nasogastric tube has been removed. The cardiomediastinal silhouette is within normal limits. There is no focal airspace consolidation. No pleural effusion. No pneumothorax. There is no evidence of free intraperitoneal gas. There is a dilated loop of small bowel in the upper right hemiabdomen. There is gas throughout the colon and mild colonic stool burden. No acute osseous abnormality. IMPRESSION: Dilated loops of small bowel in the upper right hemiabdomen, could be partial small bowel obstruction or ileus. No acute cardiopulmonary disease. Electronically Signed   By: Maurine Simmering M.D.   On: 12/22/2021 08:12   ? ?Anti-infectives: ?Anti-infectives (From admission, onward)  ? ? Start     Dose/Rate Route Frequency Ordered Stop  ? 12/21/21 1315  nirmatrelvir/ritonavir EUA (PAXLOVID) 3 tablet       ? 3 tablet Oral 2 times daily 12/21/21 1218 12/26/21 0959  ? ?  ? ? ?Assessment/Plan: ?WNU:UVOZDGU SBO vs ileus.  KUB does not show evidence of complete bowel obstruction. ?Plan:  Continue clears for now.  Reassess in am. ? LOS: 2 days  ? ? ?Aviva Signs ?12/22/2021  ?

## 2021-12-22 NOTE — Assessment & Plan Note (Addendum)
Working diagnosis however no history of abdominal surgery and could be ileus.  He is clinically improving.  Surgery team advanced to full liquids on 3/17.  No further BM since yesterday morning.  ?  ?

## 2021-12-22 NOTE — Assessment & Plan Note (Signed)
Restarted home metoprolol, temporarily holding home lisinopril.  ?

## 2021-12-22 NOTE — Assessment & Plan Note (Addendum)
He is clinically improved and surgery signed off now.  He is tolerating his diet having had several BMs and flatus.  ? ?CT ordered today by surgery.  ?

## 2021-12-22 NOTE — Assessment & Plan Note (Addendum)
He is asymptomatic, being treated with paxlovid for 5 day course.  ?

## 2021-12-22 NOTE — Hospital Course (Addendum)
61 y.o. male with medical history significant of CAD, diverticulosis, GERD, prior MI, HLD, HTN. ?  ?Patient presents from home on 12/20/21 with chief complaint of acute abdominal pain.  He states that he had some mild discomfort yesterday but it acutely worsened early this morning.  He felt like he had to throw up but did not vomit at all.  He was not able to tolerate a diet today.  He had 1 episode of nonbloody, watery diarrhea throughout the day which caused his pain to worsen.  He endorses tenesmus throughout the day but not able to have any further bowel movements. ?He took over-the-counter indigestion medication without any relief of pain. ?Denies vomiting, fever, chest pain. ?Endorses diffuse abdominal pain and distention. ?Negative for history of abdominal surgeries at any time. ?  ?In the ED: ?Significant findings include normal CBC, negative troponins, normal electrolytes. ?CT abdomen positive for partial small bowel obstruction. ?Initial vital signs were stable with heart rate 97, respirations 20, blood pressure 145/111, O2 saturations 97% on room air. ?  ?He received fentanyl, Zofran, 500 mL normal saline bolus.  General surgery was consulted and recommended NG tube.  ? ?12/23/2021: complain of severe lower back pain, muscle spasm, had BM early this morning, tolerating clears.   ? ?12/24/2021: Still complaining about lower back pain but reports that the lidocaine patch has been helpful.  No BM since yesterday morning.  Seems to be tolerating full liquids.  No emesis.  No abdominal pain. ? ?12/25/2021:  Pt having back pain, ambulating with PT, no loss of bowel or bladder function. No covid symptoms.  No abdominal pain.  Tolerating diet.  No nausea or vomiting.  Discussed back pain with surgery. Will get CT to eval for renal stone.   ? ?12/26/2021:  CT has been negative for any acute or abnormal findings.  No stone seen.  PT evaluated.  DC home today.  ?

## 2021-12-22 NOTE — Assessment & Plan Note (Addendum)
Uncontrolled as evidenced by A1c 7.8% ?Continue SSI coverage, frequent SBG monitoring.  ?Holding home oral meds temporarily.  ? ?CBG (last 3)  ?Recent Labs  ?  12/23/21 ?1626 12/23/21 ?2059 12/24/21 ?0711  ?GLUCAP 99 111* 99  ? ? ?

## 2021-12-23 ENCOUNTER — Inpatient Hospital Stay (HOSPITAL_COMMUNITY): Payer: Self-pay

## 2021-12-23 DIAGNOSIS — K5900 Constipation, unspecified: Secondary | ICD-10-CM

## 2021-12-23 DIAGNOSIS — M545 Low back pain, unspecified: Secondary | ICD-10-CM | POA: Diagnosis not present

## 2021-12-23 LAB — GLUCOSE, CAPILLARY
Glucose-Capillary: 115 mg/dL — ABNORMAL HIGH (ref 70–99)
Glucose-Capillary: 112 mg/dL — ABNORMAL HIGH (ref 70–99)
Glucose-Capillary: 99 mg/dL (ref 70–99)
Glucose-Capillary: 111 mg/dL — ABNORMAL HIGH (ref 70–99)

## 2021-12-23 LAB — BASIC METABOLIC PANEL
Anion gap: 8 (ref 5–15)
BUN: 10 mg/dL (ref 6–20)
CO2: 28 mmol/L (ref 22–32)
Calcium: 9 mg/dL (ref 8.9–10.3)
Chloride: 102 mmol/L (ref 98–111)
Creatinine, Ser: 0.9 mg/dL (ref 0.61–1.24)
GFR, Estimated: >60 mL/min (ref >60)
Glucose, Bld: 106 mg/dL — ABNORMAL HIGH (ref 70–99)
Potassium: 3.9 mmol/L (ref 3.5–5.1)
Sodium: 138 mmol/L (ref 135–145)

## 2021-12-23 LAB — MAGNESIUM: Magnesium: 2 mg/dL (ref 1.7–2.4)

## 2021-12-23 MED ORDER — OXYCODONE-ACETAMINOPHEN 5-325 MG PO TABS
1.0000 | ORAL_TABLET | Freq: Four times a day (QID) | ORAL | Status: DC | PRN
  Administered 2021-12-23 – 2021-12-26 (×8): 1 via ORAL
  Filled 2021-12-23 (×8): qty 1

## 2021-12-23 MED ORDER — MORPHINE SULFATE (PF) 2 MG/ML IV SOLN
1.0000 mg | INTRAVENOUS | Status: DC | PRN
  Administered 2021-12-23 – 2021-12-25 (×3): 1 mg via INTRAVENOUS
  Filled 2021-12-23 (×3): qty 1

## 2021-12-23 MED ORDER — CYCLOBENZAPRINE HCL 10 MG PO TABS
5.0000 mg | ORAL_TABLET | Freq: Three times a day (TID) | ORAL | Status: DC | PRN
  Administered 2021-12-23 – 2021-12-24 (×2): 5 mg via ORAL
  Filled 2021-12-23 (×3): qty 1

## 2021-12-23 MED ORDER — LIDOCAINE 5 % EX PTCH
1.0000 | MEDICATED_PATCH | Freq: Every day | CUTANEOUS | Status: DC
  Administered 2021-12-23: 1 via TRANSDERMAL
  Filled 2021-12-23 (×2): qty 1

## 2021-12-23 NOTE — Assessment & Plan Note (Signed)
Continue laxatives as ordered.  ?

## 2021-12-23 NOTE — Progress Notes (Signed)
?PROGRESS NOTE ? ? ?Anthony Hensley  HBZ:[id number] DOB: 1961-07-27 DOA: 12/20/2021 ?PCP: No primary care provider on file.  ? ?Chief Complaint  ?Patient presents with  ? Abdominal Pain  ? ?Level of care: Med-Surg ? ?Brief Admission History:  ?61 y.o. male with medical history significant of CAD, diverticulosis, GERD, prior MI, HLD, HTN. ?  ?Patient presents from home on 12/20/21 with chief complaint of acute abdominal pain.  He states that he had some mild discomfort yesterday but it acutely worsened early this morning.  He felt like he had to throw up but did not vomit at all.  He was not able to tolerate a diet today.  He had 1 episode of nonbloody, watery diarrhea throughout the day which caused his pain to worsen.  He endorses tenesmus throughout the day but not able to have any further bowel movements. ?He took over-the-counter indigestion medication without any relief of pain. ?Denies vomiting, fever, chest pain. ?Endorses diffuse abdominal pain and distention. ?Negative for history of abdominal surgeries at any time. ?  ?In the ED: ?Significant findings include normal CBC, negative troponins, normal electrolytes. ?CT abdomen positive for partial small bowel obstruction. ?Initial vital signs were stable with heart rate 97, respirations 20, blood pressure 145/111, O2 saturations 97% on room air. ?  ?He received fentanyl, Zofran, 500 mL normal saline bolus.  General surgery was consulted and recommended NG tube.  ? ?12/23/2021: complain of severe lower back pain, muscle spasm, had BM early this morning, tolerating clears.   ?  ?Assessment and Plan: ?* Small bowel obstruction ([place]) ?Working diagnosis however no history of abdominal surgery and could be ileus.  He is clinically improving.  Surgery team advanced to clears.  ? ?  ? ?Ileus ([place]) ?He is clinically improving.  Continue clears. ? ?Low back pain ?Supportive care, topical treatment, muscle relaxer.  ?Xrays with no significant injuries seen.  Likely a muscle  sprain.  ? ?COVID-19 ?He is asymptomatic, being treated with paxlovid.  ? ?Type 2 diabetes mellitus with hyperglycemia, without long-term current use of insulin ([place]) ?Uncontrolled as evidenced by A1c 7.8% ?Continue SSI coverage, frequent SBG monitoring.  ?Holding home oral meds temporarily.  ? ?CBG (last 3)  ?Recent Labs  ?  12/23/21 ?0721 12/23/21 ?1113 12/23/21 ?1626  ?GLUCAP 115* 112* 99  ? ? ? ?CAD (coronary artery disease) ?Currently no CP symptoms ?Resumed home aspirin, toprol.  ? ?Chronic Constipation ?Continue laxatives as ordered.  ? ?LUMBAR SPRAIN AND STRAIN ?Cyclobenzaprine PRN ? ?Benign essential HTN ?Restarted home metoprolol, temporarily holding home lisinopril.  ? ?DVT prophylaxis: SCD, enoxaparin ?Code Status: Full  ?Family Communication: bedside update 3/15 ?Disposition: Status is: Inpatient ?Remains inpatient appropriate because: advancing diet  ?  ?Consultants:  ?Surgery  ?Procedures:  ? ?Antimicrobials:  ?  ?Subjective: ?Pt having a lot of low back pain and muscle strain.  Had a bowel movement this morning.   ?Objective: ?Vitals:  ? 12/22/21 1239 12/22/21 2153 12/23/21 0612 12/23/21 1141  ?BP: (!) 129/91 (!) 135/94 126/81 (!) 129/96  ?Pulse: 62 70 75 65  ?Resp: '18 19 19 18  '$ ?Temp:  98 ?F (36.7 ?C) 98.2 ?F (36.8 ?C) 97.7 ?F (36.5 ?C)  ?TempSrc:  Oral Oral Oral  ?SpO2: 98% 96% 93% 94%  ?Weight:      ?Height:      ? ? ?Intake/Output Summary (Last 24 hours) at 12/23/2021 1725 ?Last data filed at 12/23/2021 1200 ?Gross per 24 hour  ?Intake 2404.09 ml  ?Output --  ?  Net 2404.09 ml  ? ?Filed Weights  ? 12/20/21 1443  ?Weight: 93.1 kg  ? ?Examination: ? ?General exam: Appears calm and comfortable  ?Respiratory system: Clear to auscultation. Respiratory effort normal. ?Cardiovascular system: normal S1 & S2 heard. No JVD, murmurs, rubs, gallops or clicks. No pedal edema. ?Gastrointestinal system: Abdomen is nondistended, soft and nontender. No organomegaly or masses felt. Normal bowel sounds heard. ?Central  nervous system: Alert and oriented. No focal neurological deficits. ?Extremities: Symmetric 5 x 5 power. ?Skin: No rashes, lesions or ulcers. ?Psychiatry: Judgement and insight appear normal. Mood & affect appropriate.  ? ?Data Reviewed: I have personally reviewed following labs and imaging studies ? ?CBC: ?Recent Labs  ?Lab 12/20/21 ?1546 12/21/21 ?0432  ?WBC 7.2 6.2  ?HGB 16.7 15.5  ?HCT 49.7 47.4  ?MCV 93.1 94.4  ?PLT 223 187  ? ? ?Basic Metabolic Panel: ?Recent Labs  ?Lab 12/20/21 ?1546 12/21/21 ?0432 12/23/21 ?0160  ?NA 138 138 138  ?K 4.2 4.2 3.9  ?CL 105 105 102  ?CO2 '23 24 28  '$ ?GLUCOSE 134* 113* 106*  ?BUN 22* 19 10  ?CREATININE 0.86 0.79 0.90  ?CALCIUM 9.2 8.8* 9.0  ?MG  --   --  2.0  ? ? ?CBG: ?Recent Labs  ?Lab 12/22/21 ?1607 12/22/21 ?2156 12/23/21 ?0721 12/23/21 ?1113 12/23/21 ?1626  ?GLUCAP 89 74 115* 112* 99  ? ? ?Recent Results (from the past 240 hour(s))  ?Resp Panel by RT-PCR (Flu A&B, Covid) Nasopharyngeal Swab     Status: Abnormal  ? Collection Time: 12/20/21  7:18 PM  ? Specimen: Nasopharyngeal Swab; Nasopharyngeal(NP) swabs in vial transport medium  ?Result Value Ref Range Status  ? SARS Coronavirus 2 by RT PCR POSITIVE (A) NEGATIVE Final  ?  Comment: (NOTE) ?SARS-CoV-2 target nucleic acids are DETECTED. ? ?The SARS-CoV-2 RNA is generally detectable in upper respiratory ?specimens during the acute phase of infection. Positive results are ?indicative of the presence of the identified virus, but do not rule ?out bacterial infection or co-infection with other pathogens not ?detected by the test. Clinical correlation with patient history and ?other diagnostic information is necessary to determine patient ?infection status. The expected result is Negative. ? ?Fact Sheet for Patients: ?EntrepreneurPulse.com.au ? ?Fact Sheet for Healthcare Providers: ?IncredibleEmployment.be ? ?This test is not yet approved or cleared by the [place] FDA and  ?has been  authorized for detection and/or diagnosis of SARS-CoV-2 by ?FDA under an Emergency Use Authorization (EUA).  This EUA will ?remain in effect (meaning this test can be used) for the duration of  ?the COVID-19 declaration under Section 564(b)(1) of the A ct, 21 ?U.S.C. section 360bbb-3(b)(1), unless the authorization is ?terminated or revoked sooner. ? ?  ? Influenza A by PCR NEGATIVE NEGATIVE Final  ? Influenza B by PCR NEGATIVE NEGATIVE Final  ?  Comment: (NOTE) ?The Xpert Xpress SARS-CoV-2/FLU/RSV plus assay is intended as an aid ?in the diagnosis of influenza from Nasopharyngeal swab specimens and ?should not be used as a sole basis for treatment. Nasal washings and ?aspirates are unacceptable for Xpert Xpress SARS-CoV-2/FLU/RSV ?testing. ? ?Fact Sheet for Patients: ?EntrepreneurPulse.com.au ? ?Fact Sheet for Healthcare Providers: ?IncredibleEmployment.be ? ?This test is not yet approved or cleared by the [place] FDA and ?has been authorized for detection and/or diagnosis of SARS-CoV-2 by ?FDA under an Emergency Use Authorization (EUA). This EUA will remain ?in effect (meaning this test can be used) for the duration of the ?COVID-19 declaration under Section 564(b)(1) of the Act, 21 U.S.C. ?  section 360bbb-3(b)(1), unless the authorization is terminated or ?revoked. ? ?Performed at [facility], [address], [place], [place] [address] ?  ?  ? ?Radiology Studies: ?DG Lumbar Spine 2-3 Views ? ?Result Date: 12/23/2021 ?CLINICAL DATA:  61 year old male with acute back pain. EXAM: LUMBAR SPINE - 2-3 VIEW COMPARISON:  CT abdomen pelvis from 12/20/2021 FINDINGS: Riblets about T12. There are 5 lumbar type vertebral bodies. There is no evidence of lumbar spine fracture. Alignment is normal. Intervertebral disc spaces are maintained. Atherosclerotic calcification of the abdominal aorta. IMPRESSION: 1. No acute fracture, malalignment, or significant degenerative change. 2.  Aortic  Atherosclerosis (ICD10-I70.0). Electronically Signed   By: Ruthann Cancer M.D.   On: 12/23/2021 11:29  ? ?DG ABD ACUTE 2+V W 1V CHEST ? ?Result Date: 12/22/2021 ?CLINICAL DATA:  Ileus versus partial small bowel obst

## 2021-12-23 NOTE — TOC Initial Note (Signed)
Transition of Care (TOC) - Initial/Assessment Note  ? ? ?Patient Details  ?Name: Anthony Hensley ?MRN: [id number] ?Date of Birth: 02-06-1961 ? ?Transition of Care (TOC) CM/SW Contact:    ?Boneta Lucks, RN ?Phone Number: ?12/23/2021, 11:04 AM ? ?Clinical Narrative:     Patient admitted with Small bowel obstruction. Patient has no PCP and no Insurance listed. TOC called to assist patient. Patient states he was approved for disability and will have medicaid in September. His pcp is not longer at the practice. He is asking for a PCP list. TOC provided, patient will call to find a PCP and make a follow up appointment.             ? ? ?Expected Discharge Plan: Home/Self Care ?Barriers to Discharge: Continued Medical Work up ? ? ?Patient Goals and CMS Choice ?Patient states their goals for this hospitalization and ongoing recovery are:: to go home. ?CMS Medicare.gov Compare Post Acute Care list provided to:: Patient ?Choice offered to / list presented to : Patient ? ?Expected Discharge Plan and Services ?Expected Discharge Plan: Home/Self Care ?   ?  ?Living arrangements for the past 2 months: [place] ?                ?Living Arrangements/Services ?Living arrangements for the past 2 months: [place] ?Lives with:: Spouse ?  ?       ?Activities of Daily Living ?Home Assistive Devices/Equipment: Cane (specify quad or straight) ?ADL Screening (condition at time of admission) ?Patient's cognitive ability adequate to safely complete daily activities?: Yes ?Is the patient deaf or have difficulty hearing?: No ?Does the patient have difficulty seeing, even when wearing glasses/contacts?: No ?Does the patient have difficulty concentrating, remembering, or making decisions?: No ?Patient able to express need for assistance with ADLs?: Yes ?Does the patient have difficulty dressing or bathing?: No ?Independently performs ADLs?: Yes (appropriate for developmental age) ?Does the patient have difficulty walking or  climbing stairs?: No ?Weakness of Legs: None ?Weakness of Arms/Hands: None ? ?Permission Sought/Granted ?    ? ?Emotional Assessment ?   ?Orientation: : Oriented to Self, Oriented to Place, Oriented to  Time, Oriented to Situation ?Alcohol / Substance Use: Not Applicable ?Psych Involvement: No (comment) ? ?Admission diagnosis:  Small bowel obstruction ([place]) [K56.609] ?Patient Active Problem List  ? Diagnosis Date Noted  ? COVID-19 12/22/2021  ? Ileus ([place])   ? Small bowel obstruction ([place]) 12/20/2021  ? Essential hypertension   ? History of non-insulin dependent diabetes mellitus   ? Stopped smoking with greater than 40 pack year history 01/28/2021  ? Type 2 diabetes mellitus with hyperglycemia, without long-term current use of insulin ([place]) 12/30/2020  ? History of COVID-19 12/30/2020  ? Hyperlipidemia associated with type 2 diabetes mellitus ([place]) 11/30/2020  ? Obesity, diabetes, and hypertension syndrome ([place])   ? MDD (major depressive disorder) 09/15/2020  ? Pain in right hip 02/21/2020  ? CAD (coronary artery disease) 03/20/2017  ? Unstable angina ([facility])   ? Precordial chest pain   ? Diarrhea 09/26/2016  ? Enteritis 09/26/2016  ? GERD (gastroesophageal reflux disease) 06/08/2016  ? Constipation 06/08/2016  ? Mucosal abnormality of stomach   ? Mucosal abnormality of esophagus   ? History of colonic polyps   ? Diverticulosis of colon without hemorrhage   ? Dysphagia 11/27/2015  ? Rectal bleeding 11/27/2015  ? Abdominal pain 11/27/2015  ? Chest pain at rest   ? History of non-ST elevation myocardial infarction (NSTEMI)   ?  Chest pain 07/11/2015  ? Benign essential HTN 07/11/2015  ? LUMBAR SPRAIN AND STRAIN 06/08/2009  ? ?PCP:  No primary care provider on file. ?Pharmacy:   ?[facility] - [place], [place] - [address] ?[address] ?[place] [address] ?Phone: [phone] Fax: [phone] ? ? ?Readmission Risk Interventions ?Readmission Risk Prevention Plan 12/23/2021 12/21/2021  ?Post Dischage  Appt Not Complete -  ?Medication Screening - Complete  ?Transportation Screening - Complete  ?Some recent data might be hidden  ? ? ? ?

## 2021-12-23 NOTE — Progress Notes (Signed)
[facility] Progress Note ? ?   ?Subjective: ?Having flatus and had a BM this AM. Says he is feeling less bloated and distended. But not back to normal. No nausea or vomiting. Did burp some. Having back pain.  ? ?Objective: ?Vital signs in last 24 hours: ?Temp:  [97.7 ?F (36.5 ?C)-98.2 ?F (36.8 ?C)] 97.7 ?F (36.5 ?C) (03/16 1141) ?Pulse Rate:  [62-75] 65 (03/16 1141) ?Resp:  [18-19] 18 (03/16 1141) ?BP: (126-135)/(81-96) 129/96 (03/16 1141) ?SpO2:  [93 %-98 %] 94 % (03/16 1141) ?Last BM Date : 12/21/21 ? ?Intake/Output from previous day: ?03/15 0701 - 03/16 0700 ?In: 700 [P.O.:700] ?Out: -  ?Intake/Output this shift: ?Total I/O ?In: 2164.1 [P.O.:240; I.V.:1924.1] ?Out: -  ? ?General appearance: alert and no distress ?Resp: normal work of breathing ?GI: soft, mildly distended, nontender ? ?Lab Results:  ?Recent Labs  ?  12/20/21 ?1546 12/21/21 ?0432  ?WBC 7.2 6.2  ?HGB 16.7 15.5  ?HCT 49.7 47.4  ?PLT 223 187  ? ?BMET ?Recent Labs  ?  12/21/21 ?0432 12/23/21 ?6314  ?NA 138 138  ?K 4.2 3.9  ?CL 105 102  ?CO2 24 28  ?GLUCOSE 113* 106*  ?BUN 19 10  ?CREATININE 0.79 0.90  ?CALCIUM 8.8* 9.0  ? ?PT/INR ?No results for input(s): LABPROT, INR in the last 72 hours. ? ?Studies/Results: ?DG Lumbar Spine 2-3 Views ? ?Result Date: 12/23/2021 ?CLINICAL DATA:  61 year old male with acute back pain. EXAM: LUMBAR SPINE - 2-3 VIEW COMPARISON:  CT abdomen pelvis from 12/20/2021 FINDINGS: Riblets about T12. There are 5 lumbar type vertebral bodies. There is no evidence of lumbar spine fracture. Alignment is normal. Intervertebral disc spaces are maintained. Atherosclerotic calcification of the abdominal aorta. IMPRESSION: 1. No acute fracture, malalignment, or significant degenerative change. 2.  Aortic Atherosclerosis (ICD10-I70.0). Electronically Signed   By: Ruthann Cancer M.D.   On: 12/23/2021 11:29  ? ?DG ABD ACUTE 2+V W 1V CHEST ? ?Result Date: 12/22/2021 ?CLINICAL DATA:  Ileus versus partial small bowel obstruction  EXAM: DG ABDOMEN ACUTE WITH 1 VIEW CHEST COMPARISON:  Radiograph 12/21/2021 FINDINGS: Nasogastric tube has been removed. The cardiomediastinal silhouette is within normal limits. There is no focal airspace consolidation. No pleural effusion. No pneumothorax. There is no evidence of free intraperitoneal gas. There is a dilated loop of small bowel in the upper right hemiabdomen. There is gas throughout the colon and mild colonic stool burden. No acute osseous abnormality. IMPRESSION: Dilated loops of small bowel in the upper right hemiabdomen, could be partial small bowel obstruction or ileus. No acute cardiopulmonary disease. Electronically Signed   By: Maurine Simmering M.D.   On: 12/22/2021 08:12   ? ?Anti-infectives: ?Anti-infectives (From admission, onward)  ? ? Start     Dose/Rate Route Frequency Ordered Stop  ? 12/21/21 1315  nirmatrelvir/ritonavir EUA (PAXLOVID) 3 tablet       ? 3 tablet Oral 2 times daily 12/21/21 1218 12/26/21 0959  ? ?  ? ? ?Assessment/Plan: ?Patient with ileus type picture versus pSBO that is resolving. Lumbar Xray with dilated colon and only small loop of dilated SB. Is overall improving. Plan for full liquid diet today and then will reassess tomorrow. KUB in the AM.  ? ?Updated patient, family and team.  ? ? LOS: 3 days  ? ? ?Virl Cagey ?12/23/2021 ? ?

## 2021-12-23 NOTE — Assessment & Plan Note (Addendum)
Cyclobenzaprine PRN, added lidocaine patch, medrol dosepak, xrays with no acute findings.   ? ?Still having severe pain, Check CT to rule out renal stone.  Check urinalysis for hematuria.   ?

## 2021-12-23 NOTE — Assessment & Plan Note (Signed)
Supportive care, topical treatment, muscle relaxer.  ?Xrays with no significant injuries seen.  Likely a muscle sprain.  ?

## 2021-12-24 ENCOUNTER — Inpatient Hospital Stay (HOSPITAL_COMMUNITY): Payer: Self-pay

## 2021-12-24 LAB — GLUCOSE, CAPILLARY
Glucose-Capillary: 99 mg/dL (ref 70–99)
Glucose-Capillary: 135 mg/dL — ABNORMAL HIGH (ref 70–99)
Glucose-Capillary: 90 mg/dL (ref 70–99)
Glucose-Capillary: 88 mg/dL (ref 70–99)

## 2021-12-24 MED ORDER — DOCUSATE SODIUM 100 MG PO CAPS
100.0000 mg | ORAL_CAPSULE | Freq: Two times a day (BID) | ORAL | Status: DC
  Administered 2021-12-25 – 2021-12-26 (×3): 100 mg via ORAL
  Filled 2021-12-24 (×4): qty 1

## 2021-12-24 MED ORDER — CYCLOBENZAPRINE HCL 10 MG PO TABS
10.0000 mg | ORAL_TABLET | Freq: Three times a day (TID) | ORAL | Status: DC | PRN
  Administered 2021-12-24: 10 mg via ORAL
  Filled 2021-12-24: qty 1

## 2021-12-24 MED ORDER — LIDOCAINE 5 % EX PTCH
1.0000 | MEDICATED_PATCH | Freq: Two times a day (BID) | CUTANEOUS | Status: DC
  Administered 2021-12-24 – 2021-12-26 (×5): 1 via TRANSDERMAL
  Filled 2021-12-24 (×4): qty 1

## 2021-12-24 NOTE — Progress Notes (Signed)
[facility] Progress Note ? ?   ?Subjective: ?Eating but no BM today. KUB with normal bowel gas pattern. Overall says abdomen better but back causing issues.  ? ?Objective: ?Vital signs in last 24 hours: ?Temp:  [97.6 ?F (36.4 ?C)-98 ?F (36.7 ?C)] 97.6 ?F (36.4 ?C) (03/17 1358) ?Pulse Rate:  [66-76] 66 (03/17 1358) ?Resp:  [16-18] 18 (03/17 1358) ?BP: (107-143)/(66-96) 143/86 (03/17 1358) ?SpO2:  [96 %-99 %] 97 % (03/17 1358) ?Last BM Date : 12/23/21 ? ?Intake/Output from previous day: ?03/16 0701 - 03/17 0700 ?In: 3612.6 [P.O.:480; I.V.:3132.6] ?Out: 300 [Urine:300] ?Intake/Output this shift: ?Total I/O ?In: 1920 [P.O.:1920] ?Out: -  ? ?General appearance: alert and no distress ?GI: distended, soft, nontender ? ?Lab Results:  ?No results for input(s): WBC, HGB, HCT, PLT in the last 72 hours. ?BMET ?Recent Labs  ?  12/23/21 ?3662  ?NA 138  ?K 3.9  ?CL 102  ?CO2 28  ?GLUCOSE 106*  ?BUN 10  ?CREATININE 0.90  ?CALCIUM 9.0  ? ?PT/INR ?No results for input(s): LABPROT, INR in the last 72 hours. ? ?Studies/Results: ?DG Lumbar Spine 2-3 Views ? ?Result Date: 12/23/2021 ?CLINICAL DATA:  61 year old male with acute back pain. EXAM: LUMBAR SPINE - 2-3 VIEW COMPARISON:  CT abdomen pelvis from 12/20/2021 FINDINGS: Riblets about T12. There are 5 lumbar type vertebral bodies. There is no evidence of lumbar spine fracture. Alignment is normal. Intervertebral disc spaces are maintained. Atherosclerotic calcification of the abdominal aorta. IMPRESSION: 1. No acute fracture, malalignment, or significant degenerative change. 2.  Aortic Atherosclerosis (ICD10-I70.0). Electronically Signed   By: Ruthann Cancer M.D.   On: 12/23/2021 11:29  ? ?DG Abd 1 View ? ?Result Date: 12/24/2021 ?CLINICAL DATA:  Ileus. EXAM: ABDOMEN - 1 VIEW COMPARISON:  12/22/2021 FINDINGS: Supine abdomen shows mild central gaseous small bowel distension. Areas seen scattered in the minimally distended right and transverse colon. Left colon  decompressed. There is some gas visible in the rectum. Visualized bony anatomy unremarkable. IMPRESSION: Nonspecific bowel gas pattern. Electronically Signed   By: Misty Stanley M.D.   On: 12/24/2021 08:34   ? ?Anti-infectives: ?Anti-infectives (From admission, onward)  ? ? Start     Dose/Rate Route Frequency Ordered Stop  ? 12/21/21 1315  nirmatrelvir/ritonavir EUA (PAXLOVID) 3 tablet       ? 3 tablet Oral 2 times daily 12/21/21 1218 12/26/21 0959  ? ?  ? ? ?Assessment/Plan: ?Patient with resolving ileus, still looks distended to me but says he is better. ?Diet as tolerated ?Bowel regimen ?Follow up with PCP  ? ? LOS: 4 days  ? ? ?Virl Cagey ?12/24/2021 ? ?

## 2021-12-24 NOTE — Progress Notes (Signed)
PT Cancellation Note ? ?Patient Details ?Name: Anthony Hensley ?MRN: [id number] ?DOB: 1960/10/15 ? ? ?Cancelled Treatment:    Reason Eval/Treat Not Completed: PT screened, no needs identified, will sign off; Spoke with RN who stated patient was independent with mobility and did not need further PT evaluation at this time. ? ? ?12:36 PM, 12/24/21 ?Mearl Latin PT, DPT ?Physical Therapist at [facility] ?[facility] ? ?

## 2021-12-24 NOTE — Progress Notes (Signed)
?PROGRESS NOTE ? ? ?Anthony Hensley  HLK:[id number] DOB: 1961/03/11 DOA: 12/20/2021 ?PCP: No primary care provider on file.  ? ?Chief Complaint  ?Patient presents with  ? Abdominal Pain  ? ?Level of care: Med-Surg ? ?Brief Admission History:  ?61 y.o. male with medical history significant of CAD, diverticulosis, GERD, prior MI, HLD, HTN. ?  ?Patient presents from home on 12/20/21 with chief complaint of acute abdominal pain.  He states that he had some mild discomfort yesterday but it acutely worsened early this morning.  He felt like he had to throw up but did not vomit at all.  He was not able to tolerate a diet today.  He had 1 episode of nonbloody, watery diarrhea throughout the day which caused his pain to worsen.  He endorses tenesmus throughout the day but not able to have any further bowel movements. ?He took over-the-counter indigestion medication without any relief of pain. ?Denies vomiting, fever, chest pain. ?Endorses diffuse abdominal pain and distention. ?Negative for history of abdominal surgeries at any time. ?  ?In the ED: ?Significant findings include normal CBC, negative troponins, normal electrolytes. ?CT abdomen positive for partial small bowel obstruction. ?Initial vital signs were stable with heart rate 97, respirations 20, blood pressure 145/111, O2 saturations 97% on room air. ?  ?He received fentanyl, Zofran, 500 mL normal saline bolus.  General surgery was consulted and recommended NG tube.  ? ?12/23/2021: complain of severe lower back pain, muscle spasm, had BM early this morning, tolerating clears.   ? ?12/24/2021: Still complaining about lower back pain but reports that the lidocaine patch has been helpful.  No BM since yesterday morning.  Seems to be tolerating full liquids.  No emesis.  No abdominal pain. ?  ?Assessment and Plan: ?* Small bowel obstruction ([place]) ?Working diagnosis however no history of abdominal surgery and could be ileus.  He is clinically improving.  Surgery team advanced  to full liquids on 3/17.  No further BM since yesterday morning.  ?  ? ?Ileus ([place]) ?He is clinically improving.  Tolerating full liquids. ? ?Low back pain ?Supportive care, topical treatment, muscle relaxer.  ?Xrays with no significant injuries seen.  Likely a muscle sprain.  ? ?COVID-19 ?He is asymptomatic, being treated with paxlovid for 5 day course.  ? ?Type 2 diabetes mellitus with hyperglycemia, without long-term current use of insulin ([place]) ?Uncontrolled as evidenced by A1c 7.8% ?Continue SSI coverage, frequent SBG monitoring.  ?Holding home oral meds temporarily.  ? ?CBG (last 3)  ?Recent Labs  ?  12/23/21 ?1626 12/23/21 ?2059 12/24/21 ?0711  ?GLUCAP 99 111* 99  ? ? ? ?CAD (coronary artery disease) ?Currently no CP symptoms ?Resumed home aspirin, toprol.  ? ?Chronic Constipation ?Continue laxatives as ordered.  ? ?LUMBAR SPRAIN AND STRAIN ?Cyclobenzaprine PRN, added lidocaine patch.  ? ?Benign essential HTN ?Restarted home metoprolol, temporarily holding home lisinopril.  ? ?DVT prophylaxis: SCD, enoxaparin ?Code Status: Full  ?Family Communication: bedside update 3/15 ?Disposition: Status is: Inpatient ?Remains inpatient appropriate because: advancing diet  ?  ?Consultants:  ?Surgery  ?Procedures:  ? ?Antimicrobials:  ?  ?Subjective: ?Pt having a lot of low back pain and muscle strain.  Had a bowel movement this morning.   ?Objective: ?Vitals:  ? 12/23/21 1141 12/23/21 2100 12/23/21 2324 12/24/21 0445  ?BP: (!) 129/96 132/87 (!) 134/96 107/66  ?Pulse: 65 76 72 68  ?Resp: '18 17  16  '$ ?Temp: 97.7 ?F (36.5 ?C) 98 ?F (36.7 ?C)  98 ?F (36.7 ?  C)  ?TempSrc: Oral Oral  Oral  ?SpO2: 94% 99%  96%  ?Weight:      ?Height:      ? ? ?Intake/Output Summary (Last 24 hours) at 12/24/2021 1015 ?Last data filed at 12/24/2021 0900 ?Gross per 24 hour  ?Intake 3852.61 ml  ?Output 300 ml  ?Net 3552.61 ml  ? ?Filed Weights  ? 12/20/21 1443  ?Weight: 93.1 kg  ? ?Examination: ? ?General exam: Appears calm and comfortable  ?Respiratory  system: Clear to auscultation. Respiratory effort normal. ?Cardiovascular system: normal S1 & S2 heard. No JVD, murmurs, rubs, gallops or clicks. No pedal edema. ?Gastrointestinal system: Abdomen is nondistended, soft and nontender. No organomegaly or masses felt. Normal bowel sounds heard. ?Central nervous system: Alert and oriented. No focal neurological deficits. ?Musculoskeletal: spasm of paraspinal muscles in lumbar spine.   ?Extremities: Symmetric 5 x 5 power. ?Skin: No rashes, lesions or ulcers. ?Psychiatry: Judgement and insight appear normal. Mood & affect appropriate.  ? ?Data Reviewed: I have personally reviewed following labs and imaging studies ? ?CBC: ?Recent Labs  ?Lab 12/20/21 ?1546 12/21/21 ?0432  ?WBC 7.2 6.2  ?HGB 16.7 15.5  ?HCT 49.7 47.4  ?MCV 93.1 94.4  ?PLT 223 187  ? ? ?Basic Metabolic Panel: ?Recent Labs  ?Lab 12/20/21 ?1546 12/21/21 ?0432 12/23/21 ?8088  ?NA 138 138 138  ?K 4.2 4.2 3.9  ?CL 105 105 102  ?CO2 '23 24 28  '$ ?GLUCOSE 134* 113* 106*  ?BUN 22* 19 10  ?CREATININE 0.86 0.79 0.90  ?CALCIUM 9.2 8.8* 9.0  ?MG  --   --  2.0  ? ? ?CBG: ?Recent Labs  ?Lab 12/23/21 ?0721 12/23/21 ?1113 12/23/21 ?1626 12/23/21 ?2059 12/24/21 ?0711  ?GLUCAP 115* 112* 99 111* 99  ? ? ?Recent Results (from the past 240 hour(s))  ?Resp Panel by RT-PCR (Flu A&B, Covid) Nasopharyngeal Swab     Status: Abnormal  ? Collection Time: 12/20/21  7:18 PM  ? Specimen: Nasopharyngeal Swab; Nasopharyngeal(NP) swabs in vial transport medium  ?Result Value Ref Range Status  ? SARS Coronavirus 2 by RT PCR POSITIVE (A) NEGATIVE Final  ?  Comment: (NOTE) ?SARS-CoV-2 target nucleic acids are DETECTED. ? ?The SARS-CoV-2 RNA is generally detectable in upper respiratory ?specimens during the acute phase of infection. Positive results are ?indicative of the presence of the identified virus, but do not rule ?out bacterial infection or co-infection with other pathogens not ?detected by the test. Clinical correlation with patient history  and ?other diagnostic information is necessary to determine patient ?infection status. The expected result is Negative. ? ?Fact Sheet for Patients: ?EntrepreneurPulse.com.au ? ?Fact Sheet for Healthcare Providers: ?IncredibleEmployment.be ? ?This test is not yet approved or cleared by the [place] FDA and  ?has been authorized for detection and/or diagnosis of SARS-CoV-2 by ?FDA under an Emergency Use Authorization (EUA).  This EUA will ?remain in effect (meaning this test can be used) for the duration of  ?the COVID-19 declaration under Section 564(b)(1) of the A ct, 21 ?U.S.C. section 360bbb-3(b)(1), unless the authorization is ?terminated or revoked sooner. ? ?  ? Influenza A by PCR NEGATIVE NEGATIVE Final  ? Influenza B by PCR NEGATIVE NEGATIVE Final  ?  Comment: (NOTE) ?The Xpert Xpress SARS-CoV-2/FLU/RSV plus assay is intended as an aid ?in the diagnosis of influenza from Nasopharyngeal swab specimens and ?should not be used as a sole basis for treatment. Nasal washings and ?aspirates are unacceptable for Xpert Xpress SARS-CoV-2/FLU/RSV ?testing. ? ?Fact Sheet for Patients: ?EntrepreneurPulse.com.au ? ?  Fact Sheet for Healthcare Providers: ?IncredibleEmployment.be ? ?This test is not yet approved or cleared by the [place] FDA and ?has been authorized for detection and/or diagnosis of SARS-CoV-2 by ?FDA under an Emergency Use Authorization (EUA). This EUA will remain ?in effect (meaning this test can be used) for the duration of the ?COVID-19 declaration under Section 564(b)(1) of the Act, 21 U.S.C. ?section 360bbb-3(b)(1), unless the authorization is terminated or ?revoked. ? ?Performed at [facility], [address], [place], [place] [address] ?  ?  ? ?Radiology Studies: ?DG Lumbar Spine 2-3 Views ? ?Result Date: 12/23/2021 ?CLINICAL DATA:  61 year old male with acute back pain. EXAM: LUMBAR SPINE - 2-3 VIEW COMPARISON:  CT abdomen  pelvis from 12/20/2021 FINDINGS: Riblets about T12. There are 5 lumbar type vertebral bodies. There is no evidence of lumbar spine fracture. Alignment is normal. Intervertebral disc spaces are [employer]

## 2021-12-24 NOTE — Progress Notes (Signed)
Pt staed pain in lower back is now a "50" out of the 0-10 scale. Pt requested PRN morphine (PF) 2 MG/ML injection 1 mg    ?Pt sitting on side of bed with feet on floor looking in the direction of the tv. Able to make needs known. Will continue to monitor ?

## 2021-12-25 ENCOUNTER — Inpatient Hospital Stay (HOSPITAL_COMMUNITY): Payer: Self-pay

## 2021-12-25 LAB — URINALYSIS, ROUTINE W REFLEX MICROSCOPIC
Bilirubin Urine: NEGATIVE
Glucose, UA: NEGATIVE mg/dL
Hgb urine dipstick: NEGATIVE
Ketones, ur: NEGATIVE mg/dL
Leukocytes,Ua: NEGATIVE
Nitrite: NEGATIVE
Protein, ur: NEGATIVE mg/dL
Specific Gravity, Urine: 1.01 (ref 1.005–1.030)
pH: 7 (ref 5.0–8.0)

## 2021-12-25 LAB — GLUCOSE, CAPILLARY
Glucose-Capillary: 102 mg/dL — ABNORMAL HIGH (ref 70–99)
Glucose-Capillary: 88 mg/dL (ref 70–99)
Glucose-Capillary: 122 mg/dL — ABNORMAL HIGH (ref 70–99)
Glucose-Capillary: 143 mg/dL — ABNORMAL HIGH (ref 70–99)

## 2021-12-25 MED ORDER — LIDOCAINE 5 % EX PTCH: 1.0000 | MEDICATED_PATCH | Freq: Two times a day (BID) | CUTANEOUS | 1 refills | Status: DC | PRN

## 2021-12-25 MED ORDER — CYCLOBENZAPRINE HCL 5 MG PO TABS: 5.0000 mg | ORAL_TABLET | Freq: Three times a day (TID) | ORAL | 0 refills | Status: DC | PRN

## 2021-12-25 MED ORDER — IOHEXOL 9 MG/ML PO SOLN
ORAL | Status: AC
  Filled 2021-12-25: qty 1000

## 2021-12-25 MED ORDER — OMEPRAZOLE MAGNESIUM 20 MG PO TBEC
20.0000 mg | DELAYED_RELEASE_TABLET | Freq: Every day | ORAL | 0 refills | Status: DC
Start: 2021-12-26 — End: 2022-07-14

## 2021-12-25 MED ORDER — IOHEXOL 9 MG/ML PO SOLN
500.0000 mL | ORAL | Status: AC
  Administered 2021-12-25 (×2): 500 mL via ORAL

## 2021-12-25 MED ORDER — METHYLPREDNISOLONE 4 MG PO TBPK
ORAL_TABLET | ORAL | 0 refills | Status: DC
Start: 2021-12-25 — End: 2022-01-12

## 2021-12-25 MED ORDER — METFORMIN HCL ER 500 MG PO TB24: 500.0000 mg | ORAL_TABLET | Freq: Every day | ORAL | 1 refills | Status: DC

## 2021-12-25 MED ORDER — OXYCODONE HCL 5 MG PO TABS
5.0000 mg | ORAL_TABLET | Freq: Four times a day (QID) | ORAL | 0 refills | Status: AC | PRN
Start: 2021-12-25 — End: 2021-12-28

## 2021-12-25 MED ORDER — DOCUSATE SODIUM 100 MG PO CAPS: 100.0000 mg | ORAL_CAPSULE | Freq: Two times a day (BID) | ORAL | 1 refills | Status: DC

## 2021-12-25 MED ORDER — IOHEXOL 300 MG/ML  SOLN
100.0000 mL | Freq: Once | INTRAMUSCULAR | Status: AC | PRN
  Administered 2021-12-25: 100 mL via INTRAVENOUS

## 2021-12-25 NOTE — Discharge Instructions (Signed)
IMPORTANT INFORMATION: PAY CLOSE ATTENTION  ? ?PHYSICIAN DISCHARGE INSTRUCTIONS ? ?Follow with Primary care provider  No primary care provider on file.  and other consultants as instructed by your Hospitalist Physician ? ?SEEK MEDICAL CARE OR RETURN TO EMERGENCY ROOM IF SYMPTOMS COME BACK, WORSEN OR NEW PROBLEM DEVELOPS  ? ?Please note: ?You were cared for by a hospitalist during your hospital stay. Every effort will be made to forward records to your primary care provider.  You can request that your primary care provider send for your hospital records if they have not received them.  Once you are discharged, your primary care physician will handle any further medical issues. Please note that NO REFILLS for any discharge medications will be authorized once you are discharged, as it is imperative that you return to your primary care physician (or establish a relationship with a primary care physician if you do not have one) for your post hospital discharge needs so that they can reassess your need for medications and monitor your lab values. ? ?Please get a complete blood count and chemistry panel checked by your Primary MD at your next visit, and again as instructed by your Primary MD. ? ?Get Medicines reviewed and adjusted: ?Please take all your medications with you for your next visit with your Primary MD ? ?Laboratory/radiological data: ?Please request your Primary MD to go over all hospital tests and procedure/radiological results at the follow up, please ask your primary care provider to get all Hospital records sent to his/her office. ? ?In some cases, they will be blood work, cultures and biopsy results pending at the time of your discharge. Please request that your primary care provider follow up on these results. ? ?If you are diabetic, please bring your blood sugar readings with you to your follow up appointment with primary care.   ? ?Please call and make your follow up appointments as soon as possible.    ? ?Also Note the following: ?If you experience worsening of your admission symptoms, develop shortness of breath, life threatening emergency, suicidal or homicidal thoughts you must seek medical attention immediately by calling 911 or calling your MD immediately  if symptoms less severe. ? ?You must read complete instructions/literature along with all the possible adverse reactions/side effects for all the Medicines you take and that have been prescribed to you. Take any new Medicines after you have completely understood and accpet all the possible adverse reactions/side effects.  ? ?Do not drive when taking Pain medications or sleeping medications (Benzodiazepines) ? ?Do not take more than prescribed Pain, Sleep and Anxiety Medications. It is not advisable to combine anxiety,sleep and pain medications without talking with your primary care practitioner ? ?Special Instructions: If you have smoked or chewed Tobacco  in the last 2 yrs please stop smoking, stop any regular Alcohol  and or any Recreational drug use. ? ?Wear Seat belts while driving.  Do not drive if taking any narcotic, mind altering or controlled substances or recreational drugs or alcohol.  ? ? ? ? ? ?

## 2021-12-25 NOTE — Progress Notes (Signed)
Pt requesting prn for pain in lower back stated to be a 9 of 10 on pain scale. Pt able to make needs known. Sitting in bed looking in the direction of the tv. Will continue to monitor. ?

## 2021-12-25 NOTE — Progress Notes (Signed)
[facility] Progress Note ? ?   ?Subjective: ?More belly and back pain. No bm.  ? ?Objective: ?Vital signs in last 24 hours: ?Temp:  [97.6 ?F (36.4 ?C)-98.2 ?F (36.8 ?C)] 97.6 ?F (36.4 ?C) (03/18 1610) ?Pulse Rate:  [61-71] 61 (03/18 0452) ?Resp:  [17-18] 17 (03/18 0452) ?BP: (130-144)/(78-92) 130/78 (03/18 0452) ?SpO2:  [95 %-100 %] 100 % (03/18 0452) ?Last BM Date : 12/24/21 ? ?Intake/Output from previous day: ?03/17 0701 - 03/18 0700 ?In: 1920 [P.O.:1920] ?Out: 1150 [Urine:1150] ?Intake/Output this shift: ?Total I/O ?In: 360 [P.O.:360] ?Out: 900 [Urine:900] ? ?General appearance: alert and no distress ?GI: soft, distended, minimally tender  ? ?Lab Results:  ?No results for input(s): WBC, HGB, HCT, PLT in the last 72 hours. ?BMET ?Recent Labs  ?  12/23/21 ?9604  ?NA 138  ?K 3.9  ?CL 102  ?CO2 28  ?GLUCOSE 106*  ?BUN 10  ?CREATININE 0.90  ?CALCIUM 9.0  ? ?PT/INR ?No results for input(s): LABPROT, INR in the last 72 hours. ? ?Studies/Results: ?DG Abd 1 View ? ?Result Date: 12/24/2021 ?CLINICAL DATA:  Ileus. EXAM: ABDOMEN - 1 VIEW COMPARISON:  12/22/2021 FINDINGS: Supine abdomen shows mild central gaseous small bowel distension. Areas seen scattered in the minimally distended right and transverse colon. Left colon decompressed. There is some gas visible in the rectum. Visualized bony anatomy unremarkable. IMPRESSION: Nonspecific bowel gas pattern. Electronically Signed   By: Misty Stanley M.D.   On: 12/24/2021 08:34   ? ?Anti-infectives: ?Anti-infectives (From admission, onward)  ? ? Start     Dose/Rate Route Frequency Ordered Stop  ? 12/21/21 1315  nirmatrelvir/ritonavir EUA (PAXLOVID) 3 tablet       ? 3 tablet Oral 2 times daily 12/21/21 1218 12/26/21 0959  ? ?  ? ? ?Assessment/Plan: ?Patient with ileus versus pSBO, back pain? Unsure of the etiology of any of it.  ?Need to repeat imaging. Will do oral contrast and do without and with to assess for any renal stones that could be contributing to the  back pain and with to look at the bowels and etiology for ileus/ pSBO.  ? ? LOS: 5 days  ? ? ?Virl Cagey ?12/25/2021 ? ?

## 2021-12-25 NOTE — Progress Notes (Signed)
?PROGRESS NOTE ? ? ?CHEY CHO  LZJ:[id number] DOB: 01-30-1961 DOA: 12/20/2021 ?PCP: No primary care provider on file.  ? ?Chief Complaint  ?Patient presents with  ? Abdominal Pain  ? ?Level of care: Med-Surg ? ?Brief Admission History:  ?61 y.o. male with medical history significant of CAD, diverticulosis, GERD, prior MI, HLD, HTN. ?  ?Patient presents from home on 12/20/21 with chief complaint of acute abdominal pain.  He states that he had some mild discomfort yesterday but it acutely worsened early this morning.  He felt like he had to throw up but did not vomit at all.  He was not able to tolerate a diet today.  He had 1 episode of nonbloody, watery diarrhea throughout the day which caused his pain to worsen.  He endorses tenesmus throughout the day but not able to have any further bowel movements. ?He took over-the-counter indigestion medication without any relief of pain. ?Denies vomiting, fever, chest pain. ?Endorses diffuse abdominal pain and distention. ?Negative for history of abdominal surgeries at any time. ?  ?In the ED: ?Significant findings include normal CBC, negative troponins, normal electrolytes. ?CT abdomen positive for partial small bowel obstruction. ?Initial vital signs were stable with heart rate 97, respirations 20, blood pressure 145/111, O2 saturations 97% on room air. ?  ?He received fentanyl, Zofran, 500 mL normal saline bolus.  General surgery was consulted and recommended NG tube.  ? ?12/23/2021: complain of severe lower back pain, muscle spasm, had BM early this morning, tolerating clears.   ? ?12/24/2021: Still complaining about lower back pain but reports that the lidocaine patch has been helpful.  No BM since yesterday morning.  Seems to be tolerating full liquids.  No emesis.  No abdominal pain. ? ?12/25/2021:  Pt having back pain, ambulating with PT, no loss of bowel or bladder function. No covid symptoms.  No abdominal pain.  Tolerating diet.  No nausea or vomiting.  Discussed  back pain with surgery. Will get CT to eval for renal stone.   ?  ?Assessment and Plan: ?Ileus ([place]) ?He is clinically improved and surgery signed off now.  He is tolerating his diet having had several BMs and flatus.  ? ?CT ordered today by surgery.  ? ?Low back pain ?Supportive care, topical treatment, muscle relaxer.  ?Xrays with no significant injuries seen.  Likely a muscle sprain.  ? ?COVID-19 ?He is asymptomatic, being treated with paxlovid for 5 day course.  ? ?Type 2 diabetes mellitus with hyperglycemia, without long-term current use of insulin ([place]) ?Uncontrolled as evidenced by A1c 7.8% ?Continue SSI coverage, frequent SBG monitoring.  ?Holding home oral meds temporarily.  ? ?CBG (last 3)  ?Recent Labs  ?  12/23/21 ?1626 12/23/21 ?2059 12/24/21 ?0711  ?GLUCAP 99 111* 99  ? ? ? ?CAD (coronary artery disease) ?Currently no CP symptoms ?Resumed home aspirin, toprol.  ? ?Chronic Constipation ?Continue laxatives as ordered.  ? ?LUMBAR SPRAIN AND STRAIN ?Cyclobenzaprine PRN, added lidocaine patch, medrol dosepak, xrays with no acute findings.   ? ?Still having severe pain, Check CT to rule out renal stone.  Check urinalysis for hematuria.   ? ?Benign essential HTN ?Restarted home metoprolol, temporarily holding home lisinopril.  ? ?DVT prophylaxis: SCD, enoxaparin ?Code Status: Full  ?Family Communication: bedside update 3/15, 3/17 ?Disposition: Status is: Inpatient ?Remains inpatient appropriate because: advancing diet  ?  ?Consultants:  ?Surgery  ?Procedures:  ? ?Antimicrobials:  ?  ?Subjective: ?Pt continues having severe back pain.  Reports difficulty with ambulation.     ?  Objective: ?Vitals:  ? 12/24/21 0445 12/24/21 1358 12/24/21 2039 12/25/21 0452  ?BP: 107/66 (!) 143/86 (!) 144/92 130/78  ?Pulse: 68 66 71 61  ?Resp: '16 18 18 17  '$ ?Temp: 98 ?F (36.7 ?C) 97.6 ?F (36.4 ?C) 98.2 ?F (36.8 ?C) 97.6 ?F (36.4 ?C)  ?TempSrc: Oral Oral Oral Oral  ?SpO2: 96% 97% 95% 100%  ?Weight:      ?Height:       ? ? ?Intake/Output Summary (Last 24 hours) at 12/25/2021 1238 ?Last data filed at 12/25/2021 0900 ?Gross per 24 hour  ?Intake 1320 ml  ?Output 2050 ml  ?Net -730 ml  ? ?Filed Weights  ? 12/20/21 1443  ?Weight: 93.1 kg  ? ?Examination: ? ?General exam: Appears calm and comfortable  ?Respiratory system: Clear to auscultation. Respiratory effort normal. ?Cardiovascular system: normal S1 & S2 heard. No JVD, murmurs, rubs, gallops or clicks. No pedal edema. ?Gastrointestinal system: Abdomen is nondistended, soft and nontender. No organomegaly or masses felt. Normal bowel sounds heard. ?Central nervous system: Alert and oriented. No focal neurological deficits. ?Musculoskeletal: spasm of paraspinal muscles in lumbar spine.   ?Extremities: Symmetric 5 x 5 power. ?Skin: No rashes, lesions or ulcers. ?Psychiatry: Judgement and insight appear normal. Mood & affect appropriate.  ? ?Data Reviewed: I have personally reviewed following labs and imaging studies ? ?CBC: ?Recent Labs  ?Lab 12/20/21 ?1546 12/21/21 ?0432  ?WBC 7.2 6.2  ?HGB 16.7 15.5  ?HCT 49.7 47.4  ?MCV 93.1 94.4  ?PLT 223 187  ? ? ?Basic Metabolic Panel: ?Recent Labs  ?Lab 12/20/21 ?1546 12/21/21 ?0432 12/23/21 ?2229  ?NA 138 138 138  ?K 4.2 4.2 3.9  ?CL 105 105 102  ?CO2 '23 24 28  '$ ?GLUCOSE 134* 113* 106*  ?BUN 22* 19 10  ?CREATININE 0.86 0.79 0.90  ?CALCIUM 9.2 8.8* 9.0  ?MG  --   --  2.0  ? ? ?CBG: ?Recent Labs  ?Lab 12/24/21 ?1100 12/24/21 ?1609 12/24/21 ?2125 12/25/21 ?0731 12/25/21 ?1117  ?GLUCAP 135* 90 88 102* 88  ? ? ?Recent Results (from the past 240 hour(s))  ?Resp Panel by RT-PCR (Flu A&B, Covid) Nasopharyngeal Swab     Status: Abnormal  ? Collection Time: 12/20/21  7:18 PM  ? Specimen: Nasopharyngeal Swab; Nasopharyngeal(NP) swabs in vial transport medium  ?Result Value Ref Range Status  ? SARS Coronavirus 2 by RT PCR POSITIVE (A) NEGATIVE Final  ?  Comment: (NOTE) ?SARS-CoV-2 target nucleic acids are DETECTED. ? ?The SARS-CoV-2 RNA is generally  detectable in upper respiratory ?specimens during the acute phase of infection. Positive results are ?indicative of the presence of the identified virus, but do not rule ?out bacterial infection or co-infection with other pathogens not ?detected by the test. Clinical correlation with patient history and ?other diagnostic information is necessary to determine patient ?infection status. The expected result is Negative. ? ?Fact Sheet for Patients: ?EntrepreneurPulse.com.au ? ?Fact Sheet for Healthcare Providers: ?IncredibleEmployment.be ? ?This test is not yet approved or cleared by the [place] FDA and  ?has been authorized for detection and/or diagnosis of SARS-CoV-2 by ?FDA under an Emergency Use Authorization (EUA).  This EUA will ?remain in effect (meaning this test can be used) for the duration of  ?the COVID-19 declaration under Section 564(b)(1) of the A ct, 21 ?U.S.C. section 360bbb-3(b)(1), unless the authorization is ?terminated or revoked sooner. ? ?  ? Influenza A by PCR NEGATIVE NEGATIVE Final  ? Influenza B by PCR NEGATIVE NEGATIVE Final  ?  Comment: (NOTE) ?  The Xpert Xpress SARS-CoV-2/FLU/RSV plus assay is intended as an aid ?in the diagnosis of influenza from Nasopharyngeal swab specimens and ?should not be used as a sole basis for treatment. Nasal washings and ?aspirates are unacceptable for Xpert Xpress SARS-CoV-2/FLU/RSV ?testing. ? ?Fact Sheet for Patients: ?EntrepreneurPulse.com.au ? ?Fact Sheet for Healthcare Providers: ?IncredibleEmployment.be ? ?This test is not yet approved or cleared by the [place] FDA and ?has been authorized for detection and/or diagnosis of SARS-CoV-2 by ?FDA under an Emergency Use Authorization (EUA). This EUA will remain ?in effect (meaning this test can be used) for the duration of the ?COVID-19 declaration under Section 564(b)(1) of the Act, 21 U.S.C. ?section 360bbb-3(b)(1), unless the  authorization is terminated or ?revoked. ? ?Performed at [facility], [address], [place], [place] [address] ?  ?  ? ?Radiology Studies: ?DG Abd 1 View ? ?Result Date: 12/24/2021 ?CLINICAL DATA:  Ileus. EXAM: ABDOMEN - 1 VIEW

## 2021-12-25 NOTE — Progress Notes (Signed)
[facility] ? ?Repeat CT with oral contrast, without and with iV shows no abnormality of intestine and no renal stones. ? ?Exam reassuring. Updated patient and family. Discussed with team. Diet as tolerated. ? ?Curlene Labrum, MD

## 2021-12-26 DIAGNOSIS — E1165 Type 2 diabetes mellitus with hyperglycemia: Secondary | ICD-10-CM

## 2021-12-26 DIAGNOSIS — S335XXD Sprain of ligaments of lumbar spine, subsequent encounter: Secondary | ICD-10-CM

## 2021-12-26 LAB — GLUCOSE, CAPILLARY: Glucose-Capillary: 109 mg/dL — ABNORMAL HIGH (ref 70–99)

## 2021-12-26 NOTE — TOC Transition Note (Signed)
Transition of Care (TOC) - CM/SW Discharge Note ? ? ?Patient Details  ?Name: Anthony Hensley ?MRN: [id number] ?Date of Birth: 1961/10/10 ? ?Transition of Care [facility]) CM/SW Contact:  ?Joaquin Courts, RN ?Phone Number: ?12/26/2021, 12:46 PM ? ? ?Clinical Narrative:    ?CM noted recommendation HHPT and written orders.  Patient is uninsured and referred for charity services with [facility], referral given to rep Bradd Canary.  Agency will review patient for eligibility.  ? ? ?Final next level of care: [place] ?Barriers to Discharge: Continued Medical Work up ? ? ?Patient Goals and CMS Choice ?Patient states their goals for this hospitalization and ongoing recovery are:: to go home. ?CMS Medicare.gov Compare Post Acute Care list provided to:: Patient ?Choice offered to / list presented to : Patient ? ?Discharge Placement ?  ?           ?  ?  ?  ?  ? ?Discharge Plan and Services ?  ?  ?           ?  ?  ?  ?  ?  ?  ?  ?  ?  ?  ? ?Social Determinants of Health (SDOH) Interventions ?  ? ? ?Readmission Risk Interventions ?Readmission Risk Prevention Plan 12/23/2021 12/21/2021  ?Post Dischage Appt Not Complete -  ?Medication Screening - Complete  ?Transportation Screening - Complete  ?Some recent data might be hidden  ? ? ? ? ? ?

## 2021-12-26 NOTE — Discharge Summary (Signed)
Physician Discharge Summary  ?Anthony Hensley WVP:[id number] DOB: 1961/04/07 DOA: 12/20/2021 ? ?Admit date: 12/20/2021 ?Discharge date: 12/26/2021 ? ?Admitted From:  Home  ?Disposition: Home  ? ?Recommendations for Outpatient Follow-up:  ?Follow up with PCP in 2 weeks ? ?Discharge Condition: STABLE   ?CODE STATUS: FULL ?DIET: resume prior home diet   ? ?Brief Hospitalization Summary: ?Please see all hospital notes, images, labs for full details of the hospitalization. ?61 y.o. male with medical history significant of CAD, diverticulosis, GERD, prior MI, HLD, HTN. ?  ?Patient presents from home on 12/20/21 with chief complaint of acute abdominal pain.  He states that he had some mild discomfort yesterday but it acutely worsened early this morning.  He felt like he had to throw up but did not vomit at all.  He was not able to tolerate a diet today.  He had 1 episode of nonbloody, watery diarrhea throughout the day which caused his pain to worsen.  He endorses tenesmus throughout the day but not able to have any further bowel movements. ?He took over-the-counter indigestion medication without any relief of pain. ?Denies vomiting, fever, chest pain. ?Endorses diffuse abdominal pain and distention. ?Negative for history of abdominal surgeries at any time. ?  ?In the ED: ?Significant findings include normal CBC, negative troponins, normal electrolytes. ?CT abdomen positive for partial small bowel obstruction. ?Initial vital signs were stable with heart rate 97, respirations 20, blood pressure 145/111, O2 saturations 97% on room air. ?  ?He received fentanyl, Zofran, 500 mL normal saline bolus.  General surgery was consulted and recommended NG tube.  ? ?12/23/2021: complain of severe lower back pain, muscle spasm, had BM early this morning, tolerating clears.   ? ?12/24/2021: Still complaining about lower back pain but reports that the lidocaine patch has been helpful.  No BM since yesterday morning.  Seems to be tolerating full  liquids.  No emesis.  No abdominal pain. ? ?12/25/2021:  Pt having back pain, ambulating with PT, no loss of bowel or bladder function. No covid symptoms.  No abdominal pain.  Tolerating diet.  No nausea or vomiting.  Discussed back pain with surgery. Will get CT to eval for renal stone.   ? ?12/26/2021:  CT has been negative for any acute or abnormal findings.  No stone seen.  PT evaluated.  DC home today.  ? ?HOSPITAL COURSE BY PROBLEM LIST ? ?Assessment and Plan: ?Ileus ([place]) ?He is clinically improved and surgery signed off now.  He is tolerating his diet having had several BMs and flatus.  ? ?CT ordered today by surgery.  ? ?Low back pain ?Supportive care, topical treatment, muscle relaxer.  ?Xrays with no significant injuries seen.  Likely a muscle sprain.  ? ?COVID-19 ?He is asymptomatic, being treated with paxlovid for 5 day course.  ? ?Type 2 diabetes mellitus with hyperglycemia, without long-term current use of insulin ([place]) ?Uncontrolled as evidenced by A1c 7.8% ?Continue SSI coverage, frequent SBG monitoring.  ?Holding home oral meds temporarily.  ? ?CBG (last 3)  ?Recent Labs  ?  12/23/21 ?1626 12/23/21 ?2059 12/24/21 ?0711  ?GLUCAP 99 111* 99  ? ? ? ?CAD (coronary artery disease) ?Currently no CP symptoms ?Resumed home aspirin, toprol.  ? ?Chronic Constipation ?Continue laxatives as ordered.  ? ?LUMBAR SPRAIN AND STRAIN ?Cyclobenzaprine PRN, added lidocaine patch, medrol dosepak, xrays with no acute findings.   ? ?Still having severe pain, Check CT to rule out renal stone.  Check urinalysis for hematuria.   ? ?Benign  essential HTN ?Restarted home metoprolol, temporarily holding home lisinopril.  ? ? ?Discharge Diagnoses:  ?Active Problems: ?  Ileus ([place]) ?  LUMBAR SPRAIN AND STRAIN ?  Chronic Constipation ?  CAD (coronary artery disease) ?  Type 2 diabetes mellitus with hyperglycemia, without long-term current use of insulin ([place]) ?  COVID-19 ?  Low back pain ?  Benign essential HTN ? ? ?Discharge  Instructions: ? ?Allergies as of 12/26/2021   ?No Known Allergies ?  ? ?  ?Medication List  ?  ? ?STOP taking these medications   ? ?ascorbic acid 500 MG tablet ?Commonly known as: VITAMIN C ?  ?Blood Glucose System Pak Kit ?  ?BLOOD GLUCOSE TEST STRIPS Strp ?  ?dapagliflozin propanediol 5 MG Tabs tablet ?Commonly known as: [place] ?  ?Lancets Misc ?  ?lisinopril 10 MG tablet ?Commonly known as: ZESTRIL ?  ?pantoprazole 40 MG tablet ?Commonly known as: PROTONIX ?  ?sertraline 50 MG tablet ?Commonly known as: ZOLOFT ?  ?zinc sulfate 220 (50 Zn) MG capsule ?  ? ?  ? ?TAKE these medications   ? ?aspirin 81 MG EC tablet ?Take 1 tablet (81 mg total) by mouth daily. ?  ?cyclobenzaprine 5 MG tablet ?Commonly known as: FLEXERIL ?Take 1-2 tablets (5-10 mg total) by mouth 3 (three) times daily as needed for muscle spasms. ?  ?docusate sodium 100 MG capsule ?Commonly known as: COLACE ?Take 1 capsule (100 mg total) by mouth 2 (two) times daily. ?  ?ezetimibe 10 MG tablet ?Commonly known as: ZETIA ?Take 1 tablet (10 mg total) by mouth daily. ?  ?lidocaine 5 % ?Commonly known as: LIDODERM ?Place 1 patch onto the skin every 12 (twelve) hours as needed (back pain). Remove & Discard patch within 12 hours or as directed by MD ?  ?metFORMIN 500 MG 24 hr tablet ?Commonly known as: GLUCOPHAGE-XR ?Take 1 tablet (500 mg total) by mouth daily with breakfast. ?What changed:  ?medication strength ?how much to take ?  ?methylPREDNISolone 4 MG Tbpk tablet ?Commonly known as: MEDROL DOSEPAK ?Take dosepak as directed ?  ?metoprolol succinate 25 MG 24 hr tablet ?Commonly known as: TOPROL-XL ?Take 1 tablet (25 mg total) by mouth daily. ?  ?nitroGLYCERIN 0.4 MG SL tablet ?Commonly known as: NITROSTAT ?Place 1 tablet (0.4 mg total) under the tongue every 5 (five) minutes as needed for chest pain. ?  ?omeprazole 20 MG tablet ?Commonly known as: PriLOSEC OTC ?Take 1 tablet (20 mg total) by mouth daily with breakfast. ?  ?oxyCODONE 5 MG immediate  release tablet ?Commonly known as: Oxy IR/ROXICODONE ?Take 1 tablet (5 mg total) by mouth every 6 (six) hours as needed for up to 3 days for severe pain. ?  ?rosuvastatin 40 MG tablet ?Commonly known as: CRESTOR ?TAKE 1 TABLET BY MOUTH EVERY DAY AT 6 P.M. ?What changed:  ?how much to take ?how to take this ?when to take this ?additional instructions ?  ? ?  ? ? Follow-up Information   ? ? PCP. Schedule an appointment as soon as possible for a visit in 2 week(s).   ?Why: Hospital Follow Up ? ?  ?  ? ?  ?  ? ?  ? ?No Known Allergies ?Allergies as of 12/26/2021   ?No Known Allergies ?  ? ?  ?Medication List  ?  ? ?STOP taking these medications   ? ?ascorbic acid 500 MG tablet ?Commonly known as: VITAMIN C ?  ?Blood Glucose System Pak Kit ?  ?BLOOD GLUCOSE TEST  STRIPS Strp ?  ?dapagliflozin propanediol 5 MG Tabs tablet ?Commonly known as: [place] ?  ?Lancets Misc ?  ?lisinopril 10 MG tablet ?Commonly known as: ZESTRIL ?  ?pantoprazole 40 MG tablet ?Commonly known as: PROTONIX ?  ?sertraline 50 MG tablet ?Commonly known as: ZOLOFT ?  ?zinc sulfate 220 (50 Zn) MG capsule ?  ? ?  ? ?TAKE these medications   ? ?aspirin 81 MG EC tablet ?Take 1 tablet (81 mg total) by mouth daily. ?  ?cyclobenzaprine 5 MG tablet ?Commonly known as: FLEXERIL ?Take 1-2 tablets (5-10 mg total) by mouth 3 (three) times daily as needed for muscle spasms. ?  ?docusate sodium 100 MG capsule ?Commonly known as: COLACE ?Take 1 capsule (100 mg total) by mouth 2 (two) times daily. ?  ?ezetimibe 10 MG tablet ?Commonly known as: ZETIA ?Take 1 tablet (10 mg total) by mouth daily. ?  ?lidocaine 5 % ?Commonly known as: LIDODERM ?Place 1 patch onto the skin every 12 (twelve) hours as needed (back pain). Remove & Discard patch within 12 hours or as directed by MD ?  ?metFORMIN 500 MG 24 hr tablet ?Commonly known as: GLUCOPHAGE-XR ?Take 1 tablet (500 mg total) by mouth daily with breakfast. ?What changed:  ?medication strength ?how much to take ?   ?methylPREDNISolone 4 MG Tbpk tablet ?Commonly known as: MEDROL DOSEPAK ?Take dosepak as directed ?  ?metoprolol succinate 25 MG 24 hr tablet ?Commonly known as: TOPROL-XL ?Take 1 tablet (25 mg total) by mouth daily. ?  ?nitroGL

## 2021-12-26 NOTE — Evaluation (Signed)
Physical Therapy Evaluation ?Patient Details ?Name: Anthony Hensley ?MRN: [id number] ?DOB: November 20, 1960 ?Today's Date: 12/26/2021 ? ?History of Present Illness ? Anthony Hensley is a 61 y.o. male with medical history significant of CAD, diverticulosis, GERD, prior MI, HLD, HTN.     Patient presents from home on 12/20/21 with chief complaint of acute abdominal pain.  He states that he had some mild discomfort yesterday but it acutely worsened early this morning.  He felt like he had to throw up but did not vomit at all.  He was not able to tolerate a diet today.  He had 1 episode of nonbloody, watery diarrhea throughout the day which caused his pain to worsen.  He endorses tenesmus throughout the day but not able to have any further bowel movements.  He took over-the-counter indigestion medication without any relief of pain.  Denies vomiting, fever, chest pain.  Endorses diffuse abdominal pain and distention.  Negative for history of abdominal surgeries at any time. ? ?  ?Clinical Impression ? Patient sitting up in bed eating finishing breakfast on PT arrival.  He is pleasant and agreeable to physical therapy.  Patient reports he is still having some back pain although somewhat better today than yesterday.  Patient performs sit to stand from EOB pushing up on RW with CGA with reports of some increased back pain; takes extra time to perform. He walks in the room x 50 ft with RW and CGA.  We return to sit on EOB and then transfer to the chair; again he reports back pain with sit to stand movement but can perform with CGA and extra time.  Patient in chair at end of treatment with legs elevated and call button in reach.  Nursing notified of above.  Patient will benefit from continued skilled therapy services during his hospital stay to address deficits and promote return to PLOF.    ?   ? ?Recommendations for follow up therapy are one component of a multi-disciplinary discharge planning process, led by the attending physician.   Recommendations may be updated based on patient status, additional functional criteria and insurance authorization. ? ?Follow Up Recommendations Home health PT ? ?  ?Assistance Recommended at Discharge Intermittent Supervision/Assistance  ?Patient can return home with the following ? A little help with walking and/or transfers;A little help with bathing/dressing/bathroom;Assistance with cooking/housework;Help with stairs or ramp for entrance ? ?  ?Equipment Recommendations None recommended by PT  ?Recommendations for Other Services ?    ?  ?Functional Status Assessment Patient has had a recent decline in their functional status and demonstrates the ability to make significant improvements in function in a reasonable and predictable amount of time.  ? ?  ?Precautions / Restrictions Precautions ?Precautions: Fall  ? ?  ? ?Mobility ? Bed Mobility ?Overal bed mobility: Needs Assistance ?Bed Mobility: Supine to Sit ?  ?  ?Supine to sit: Supervision ?  ?  ?  ?  ? ?Transfers ?Overall transfer level: Needs assistance ?Equipment used: Rolling walker (2 wheels) ?Transfers: Sit to/from Stand, Bed to chair/wheelchair/BSC ?Sit to Stand: Min guard ?  ?Step pivot transfers: Min guard ?  ?  ?  ?General transfer comment: pain with sit to stand to RW but able to perform with CGA; takes extra time ?  ? ?Ambulation/Gait ?Ambulation/Gait assistance: Min guard ?Gait Distance (Feet): 50 Feet ?Assistive device: Rolling walker (2 wheels) ?Gait Pattern/deviations: Decreased step length - right, Decreased step length - left, Antalgic ?Gait velocity: decreased gait speed ?  ?  ?  General Gait Details: antalgic gait ? ?Stairs ?  ?  ?  ?  ?  ? ?Wheelchair Mobility ?  ? ?Modified Rankin (Stroke Patients Only) ?  ? ?  ? ?Balance   ?  ?  ?  ?  ?  ?  ?  ?  ?  ?  ?  ?  ?  ?  ?  ?  ?  ?  ?   ? ? ? ?Pertinent Vitals/Pain Pain Assessment ?Pain Assessment: 0-10 ?Pain Score: 7  ?Pain Descriptors / Indicators: Spasm ?Pain Intervention(s): Limited activity  within patient's tolerance  ? ? ?Home Living Family/patient expects to be discharged to:: Private residence ?Living Arrangements: Spouse/significant other ?Available Help at Discharge: Family ?Type of Home: House ?Home Access: Stairs to enter ?Entrance Stairs-Rails: None ?Entrance Stairs-Number of Steps: 3 ?  ?Home Layout: One level ?Home Equipment: Conservation officer, nature (2 wheels);Shower seat;Grab bars - tub/shower ?Additional Comments: wife has a walker he can use  ?  ?Prior Function Prior Level of Function : Independent/Modified Independent ?  ?  ?  ?  ?  ?  ?  ?  ?  ? ? ?Hand Dominance  ?   ? ?  ?Extremity/Trunk Assessment  ? Upper Extremity Assessment ?Upper Extremity Assessment: Generalized weakness ?  ? ?Lower Extremity Assessment ?Lower Extremity Assessment: Generalized weakness ?  ? ?Cervical / Trunk Assessment ?Cervical / Trunk Assessment: Other exceptions ?Cervical / Trunk Exceptions: complaining of back pain with movement especially sit to stand  ?Communication  ? Communication: No difficulties  ?Cognition Arousal/Alertness: Awake/alert ?Behavior During Therapy: [facility] for tasks assessed/performed ?Overall Cognitive Status: Within Functional Limits for tasks assessed ?  ?  ?  ?  ?  ?  ?  ?  ?  ?  ?  ?  ?  ?  ?  ?  ?  ?  ?  ? ?  ?General Comments   ? ?  ?Exercises    ? ?Assessment/Plan  ?  ?PT Assessment Patient needs continued PT services  ?PT Problem List Decreased strength;Decreased activity tolerance;Decreased balance;Decreased mobility;Pain ? ?   ?  ?PT Treatment Interventions DME instruction;Balance training;Gait training;Neuromuscular re-education;Functional mobility training;Patient/family education;Therapeutic activities;Therapeutic exercise   ? ?PT Goals (Current goals can be found in the Care Plan section)  ?Acute Rehab PT Goals ?Patient Stated Goal: return home ?PT Goal Formulation: With patient ?Time For Goal Achievement: 01/09/22 ?Potential to Achieve Goals: Good ? ?  ?Frequency Min 2X/week ?   ? ? ?Co-evaluation   ?  ?  ?  ?  ? ? ?  ?AM-PAC PT "6 Clicks" Mobility  ?Outcome Measure Help needed turning from your back to your side while in a flat bed without using bedrails?: A Little ?Help needed moving from lying on your back to sitting on the side of a flat bed without using bedrails?: A Little ?Help needed moving to and from a bed to a chair (including a wheelchair)?: A Little ?Help needed standing up from a chair using your arms (e.g., wheelchair or bedside chair)?: A Little ?Help needed to walk in hospital room?: A Little ?Help needed climbing 3-5 steps with a railing? : A Lot ?6 Click Score: 17 ? ?  ?End of Session   ?Activity Tolerance: Patient limited by pain ?Patient left: in chair;with call bell/phone within reach ?Nurse Communication: Mobility status ?PT Visit Diagnosis: Other abnormalities of gait and mobility (R26.89);Muscle weakness (generalized) (M62.81);Unsteadiness on feet (R26.81) ?  ? ?Time: 5993-5701 ?PT Time Calculation (  min) (ACUTE ONLY): 23 min ? ? ?Charges:   PT Evaluation ?$PT Eval Low Complexity: 1 Low ?PT Treatments ?$Gait Training: 8-22 mins ?  ?   ? ? ?10:23 AM, 12/26/21 ?Tucker Minter Small Jeniyah Menor MPT ?[facility] ?[place] [phone] ?[phone] ? ?

## 2021-12-26 NOTE — Plan of Care (Signed)
?  Problem: Acute Rehab PT Goals(only PT should resolve) ?Goal: Pt Will Go Supine/Side To Sit ?Outcome: Progressing ?Goal: Patient Will Transfer Sit To/From Stand ?Outcome: Progressing ?Goal: Pt Will Transfer Bed To Chair/Chair To Bed ?Outcome: Progressing ?Goal: Pt Will Ambulate ?Outcome: Progressing ?  ?

## 2022-01-12 ENCOUNTER — Ambulatory Visit (INDEPENDENT_AMBULATORY_CARE_PROVIDER_SITE_OTHER): Payer: Self-pay | Admitting: Family Medicine

## 2022-01-12 DIAGNOSIS — I10 Essential (primary) hypertension: Secondary | ICD-10-CM

## 2022-01-12 DIAGNOSIS — E1169 Type 2 diabetes mellitus with other specified complication: Secondary | ICD-10-CM

## 2022-01-12 DIAGNOSIS — I2510 Atherosclerotic heart disease of native coronary artery without angina pectoris: Secondary | ICD-10-CM

## 2022-01-12 DIAGNOSIS — E118 Type 2 diabetes mellitus with unspecified complications: Secondary | ICD-10-CM

## 2022-01-12 DIAGNOSIS — E785 Hyperlipidemia, unspecified: Secondary | ICD-10-CM

## 2022-01-12 DIAGNOSIS — M25859 Other specified joint disorders, unspecified hip: Secondary | ICD-10-CM | POA: Insufficient documentation

## 2022-01-12 NOTE — Assessment & Plan Note (Signed)
Followed by cardiology. Stable.   

## 2022-01-12 NOTE — Assessment & Plan Note (Signed)
A1c at goal.  Continue metformin. 

## 2022-01-12 NOTE — Assessment & Plan Note (Signed)
BP well controlled.  Continue metoprolol. 

## 2022-01-12 NOTE — Assessment & Plan Note (Signed)
Will need lipid panel at next office visit.  Continue Crestor and Zetia. ?

## 2022-01-12 NOTE — Progress Notes (Signed)
? ?Subjective:  ?Patient ID: Anthony Hensley, male    DOB: 09-10-1961  Age: 61 y.o. MRN: [id number] ? ?CC: ?Chief Complaint  ?Patient presents with  ? Hospitalization Follow-up  ?  Small bowel obstruction ?New Patient- establish care  ? ? ?HPI: ? ?61 year old male with hypertension, CAD, GERD, recent partial small bowel obstruction, hyperlipidemia, type 2 diabetes presents to establish care. ? ?Hypertension is stable on metoprolol. ? ?Type 2 diabetes is at goal.  Most recent A1c was 6.4.  He is compliant with metformin. ? ?Regarding his hyperlipidemia, his LDL has been at goal based off his last lipid panel.  However, his triglycerides have been elevated.  However, this was obtained when his diabetes was not well controlled.  I suspect that his triglycerides are better controlled given his most recent A1c.  He is on Zetia and Crestor and tolerating well. ? ?Patient states that he needs a handicap placard regarding right hip pain.  I have reviewed his prior x-rays which are consistent with femoral acetabular impingement.  He has completed physical therapy.  He has not recently seen an orthopedist.  He does not want to do so at this time due to the fact that he has no insurance.  He wants to wait. ? ?Patient Active Problem List  ? Diagnosis Date Noted  ? Type 2 diabetes mellitus with complications ([place]) 32/35/5732  ? Femoral acetabular impingement 01/12/2022  ? Essential hypertension   ? Hyperlipidemia associated with type 2 diabetes mellitus ([place]) 11/30/2020  ? MDD (major depressive disorder) 09/15/2020  ? CAD (coronary artery disease) 03/20/2017  ? GERD (gastroesophageal reflux disease) 06/08/2016  ? History of colonic polyps   ? ? ?Social Hx   ?Social History  ? ?Socioeconomic History  ? Marital status: Married  ?  Spouse name: Anthony Hensley  ? Number of children: 1  ? Years of education: Not on file  ? Highest education level: 9th grade  ?Occupational History  ? Occupation: Glass blower/designer  ?  Employer: [employer]   ?  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  Comment: 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 working  ?Tobacco Use  ? Smoking status: Former  ?  Packs/day: 0.75  ?  Years: 35.00  ?  Pack years: 26.25  ?  Types: Cigarettes  ?  Start date: 08/08/1976  ?  Quit date: 02/21/2018  ?  Years since quitting: 3.8  ? Smokeless tobacco: Never  ?Vaping Use  ? Vaping Use: Never used  ?Substance and Sexual Activity  ? Alcohol use: No  ?  Alcohol/week: 0.0 standard drinks  ? Drug use: No  ? Sexual activity: Not on file  ?Other Topics Concern  ? Not on file  ?Social History Narrative  ? Lives with wife  ? ?Social Determinants of Health  ? ?Financial Resource Strain: Not on file  ?Food Insecurity: Not on file  ?Transportation Needs: Not on file  ?Physical Activity: Not on file  ?Stress: Not on file  ?Social Connections: Not on file  ? ? ?Review of Systems  ?Constitutional: Negative.   ?Respiratory: Negative.    ?Cardiovascular: Negative.   ?Gastrointestinal:  Negative for constipation, diarrhea, nausea and vomiting.  ? ?Objective:  ?BP 133/83   Pulse 72   Ht '5\' 6"'$  (1.676 m)   Wt 205 lb 6.4 oz (93.2 kg)   BMI 33.15 kg/m?  ? ? ?  01/12/2022  ?  9:32 AM 12/25/2021  ?  9:15 PM 12/25/2021  ?  1:48 PM  ?BP/Weight  ?Systolic BP  133 134 152  ?Diastolic BP 83 88 83  ?Wt. (Lbs) 205.4    ?BMI 33.15 kg/m2    ? ? ?Physical Exam ?Vitals and nursing note reviewed.  ?Constitutional:   ?   General: He is not in acute distress. ?   Appearance: Normal appearance. He is not ill-appearing.  ?HENT:  ?   Head: Normocephalic and atraumatic.  ?Eyes:  ?   Conjunctiva/sclera: Conjunctivae normal.  ?Cardiovascular:  ?   Rate and Rhythm: Normal rate and regular rhythm.  ?Pulmonary:  ?   Effort: Pulmonary effort is normal.  ?   Breath sounds: Normal breath sounds. No wheezing, rhonchi or rales.  ?Abdominal:  ?   Comments: Protuberant abdomen.  Nontender.  ?Neurological:  ?   Mental Status: He is alert.  ?Psychiatric:     ?   Mood and Affect: Mood normal.     ?   Behavior: Behavior normal.  ? ? ?Lab Results   ?Component Value Date  ? WBC 6.2 12/21/2021  ? HGB 15.5 12/21/2021  ? HCT 47.4 12/21/2021  ? PLT 187 12/21/2021  ? GLUCOSE 106 (H) 12/23/2021  ? CHOL 107 01/26/2021  ? TRIG 256 (H) 01/26/2021  ? HDL 37 (L) 01/26/2021  ? [place] 40 01/26/2021  ? ALT 88 (H) 12/20/2021  ? AST 37 12/20/2021  ? NA 138 12/23/2021  ? K 3.9 12/23/2021  ? CL 102 12/23/2021  ? CREATININE 0.90 12/23/2021  ? BUN 10 12/23/2021  ? CO2 28 12/23/2021  ? TSH 0.90 10/13/2020  ? INR 0.90 12/19/2017  ? HGBA1C 6.4 (H) 12/21/2021  ? MICROALBUR 1.3 11/30/2020  ? ? ? ?Assessment & Plan:  ? ?Problem List Items Addressed This Visit   ? ?  ? Cardiovascular and Mediastinum  ? CAD (coronary artery disease)  ?  Followed by cardiology.  Stable. ?  ?  ? Essential hypertension  ?  BP well controlled.  Continue metoprolol. ?  ?  ?  ? Endocrine  ? Hyperlipidemia associated with type 2 diabetes mellitus ([place])  ?  Will need lipid panel at next office visit.  Continue Crestor and Zetia. ?  ?  ? Type 2 diabetes mellitus with complications ([place])  ?  A1c at goal.  Continue metformin. ?  ?  ? ? ?Follow-up:  6 months ? ?Thersa Salt DO ?[place] ? ?

## 2022-01-12 NOTE — Patient Instructions (Signed)
Continue your current medications.  Follow up in 6 months.   Take care  Dr. Dametri Ozburn  
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
## 2022-07-14 ENCOUNTER — Other Ambulatory Visit: Payer: Self-pay | Admitting: Family Medicine

## 2022-07-14 ENCOUNTER — Ambulatory Visit (INDEPENDENT_AMBULATORY_CARE_PROVIDER_SITE_OTHER): Payer: Medicare HMO | Admitting: Family Medicine

## 2022-07-14 ENCOUNTER — Encounter: Payer: Self-pay | Admitting: Family Medicine

## 2022-07-14 VITALS — BP 112/60 | HR 85 | Temp 97.7°F | Wt 196.6 lb

## 2022-07-14 DIAGNOSIS — E785 Hyperlipidemia, unspecified: Secondary | ICD-10-CM

## 2022-07-14 DIAGNOSIS — E118 Type 2 diabetes mellitus with unspecified complications: Secondary | ICD-10-CM | POA: Diagnosis not present

## 2022-07-14 DIAGNOSIS — Z130 Encounter for screening for diseases of the blood and blood-forming organs and certain disorders involving the immune mechanism: Secondary | ICD-10-CM

## 2022-07-14 DIAGNOSIS — J439 Emphysema, unspecified: Secondary | ICD-10-CM

## 2022-07-14 DIAGNOSIS — Z125 Encounter for screening for malignant neoplasm of prostate: Secondary | ICD-10-CM

## 2022-07-14 DIAGNOSIS — Z23 Encounter for immunization: Secondary | ICD-10-CM

## 2022-07-14 DIAGNOSIS — E1169 Type 2 diabetes mellitus with other specified complication: Secondary | ICD-10-CM

## 2022-07-14 DIAGNOSIS — R9389 Abnormal findings on diagnostic imaging of other specified body structures: Secondary | ICD-10-CM | POA: Insufficient documentation

## 2022-07-14 DIAGNOSIS — N401 Enlarged prostate with lower urinary tract symptoms: Secondary | ICD-10-CM

## 2022-07-14 DIAGNOSIS — I10 Essential (primary) hypertension: Secondary | ICD-10-CM | POA: Diagnosis not present

## 2022-07-14 DIAGNOSIS — I2510 Atherosclerotic heart disease of native coronary artery without angina pectoris: Secondary | ICD-10-CM

## 2022-07-14 DIAGNOSIS — R6882 Decreased libido: Secondary | ICD-10-CM

## 2022-07-14 DIAGNOSIS — Z87891 Personal history of nicotine dependence: Secondary | ICD-10-CM | POA: Insufficient documentation

## 2022-07-14 DIAGNOSIS — R350 Frequency of micturition: Secondary | ICD-10-CM

## 2022-07-14 MED ORDER — TAMSULOSIN HCL 0.4 MG PO CAPS: 0.4000 mg | ORAL_CAPSULE | Freq: Every day | ORAL | 1 refills | Status: DC

## 2022-07-14 MED ORDER — IPRATROPIUM-ALBUTEROL 20-100 MCG/ACT IN AERS
1.0000 | INHALATION_SPRAY | Freq: Four times a day (QID) | RESPIRATORY_TRACT | 1 refills | Status: DC | PRN
Start: 2022-07-14 — End: 2022-07-14

## 2022-07-14 MED ORDER — ALBUTEROL SULFATE HFA 108 (90 BASE) MCG/ACT IN AERS: 1.0000 | INHALATION_SPRAY | Freq: Four times a day (QID) | RESPIRATORY_TRACT | 6 refills | Status: DC | PRN

## 2022-07-14 NOTE — Assessment & Plan Note (Signed)
Lipid panel today to reassess. 

## 2022-07-14 NOTE — Assessment & Plan Note (Signed)
Currently off his medication for an unknown reason.  A1c today.

## 2022-07-14 NOTE — Assessment & Plan Note (Signed)
BP well controlled.  Continue lisinopril and metoprolol.

## 2022-07-14 NOTE — Assessment & Plan Note (Signed)
Given exertional sweating/diaphoresis I am concerned.  We will arrange follow-up with cardiology.  Advised to rest and avoid significant exertion.  Continue current medications.

## 2022-07-14 NOTE — Patient Instructions (Signed)
I have prescribed a Combivent.  I have also started you on a medication to help your urinary frequency.  Continue your other medications.  Labs and urine [facility] at [facility].  We will arrange for you to see cardiology in the near future.  Follow-up in 3 months.

## 2022-07-14 NOTE — Assessment & Plan Note (Signed)
Review of the EMR reflects that he had a prior CT scan which revealed emphysematous changes.  Patient is requesting refill on Combivent which she was previously unable to afford.  Rx refilled.

## 2022-07-14 NOTE — Progress Notes (Signed)
Subjective:  Patient ID: Anthony Hensley, male    DOB: October 29, 1960  Age: 61 y.o. MRN: [id number]  CC: Chief Complaint  Patient presents with   Hypertension    No issues with blood pressure.    Diabetes    Hot flashes, urine frequency, fatigue, low sex drive. Sister would like testosterone checked.     HPI:  61 year old male with CAD, hypertension, GERD, hyperlipidemia, type 2 diabetes presents for follow-up.  Hypertension is well controlled on lisinopril and metoprolol.  I am unsure why but patient is no longer taking metformin.  Previous A1c was at goal.  Needs labs today.  Patient reports ongoing urinary frequency.  Particular troublesome at night.  Has been going on for the past several weeks.  No dysuria.  Also reports decreased libido.  He is concerned about his testosterone level.  Patient also reports that over the past 6 months he has had significant sweating particular with exertion.  Denies chest pain.  Denies shortness of breath.  Has not been seen by cardiology since December.   Patient Active Problem List   Diagnosis Date Noted   Former smoker 07/14/2022   Abnormal chest CT 07/14/2022   Emphysema lung ([place]) 07/14/2022   Benign prostatic hyperplasia with urinary frequency 07/14/2022   Type 2 diabetes mellitus with complications ([place]) 54/98/2641   Femoral acetabular impingement 01/12/2022   Essential hypertension    Hyperlipidemia associated with type 2 diabetes mellitus ([place]) 11/30/2020   MDD (major depressive disorder) 09/15/2020   CAD (coronary artery disease) 03/20/2017   GERD (gastroesophageal reflux disease) 06/08/2016   History of colonic polyps     Social Hx   Social History   Socioeconomic History   Marital status: Married    Spouse name: [name]   Number of children: 1   Years of education: Not on file   Highest education level: 9th grade  Occupational History   Occupation: IT sales professional: [employer]    Comment: 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 working  Tobacco Use   Smoking status: Former    Packs/day: 0.75    Years: 35.00    Total pack years: 26.25    Types: Cigarettes    Start date: 08/08/1976    Quit date: 02/21/2018    Years since quitting: 4.3   Smokeless tobacco: Never  Vaping Use   Vaping Use: Never used  Substance and Sexual Activity   Alcohol use: No    Alcohol/week: 0.0 standard drinks of alcohol   Drug use: No   Sexual activity: Not on file  Other Topics Concern   Not on file  Social History Narrative   Lives with wife   Social Determinants of Health   Financial Resource Strain: Not on file  Food Insecurity: Not on file  Transportation Needs: Not on file  Physical Activity: Not on file  Stress: Not on file  Social Connections: Not on file    Review of Systems Per HPI  Objective:  BP 112/60   Pulse 85   Temp 97.7 F (36.5 C)   Wt 196 lb 9.6 oz (89.2 kg)   SpO2 96%   BMI 31.73 kg/m      07/14/2022    8:28 AM 01/12/2022    9:32 AM 12/25/2021    9:15 PM  BP/Weight  Systolic BP 583 094 076  Diastolic BP 60 83 88  Wt. (Lbs) 196.6 205.4   BMI 31.73 kg/m2 33.15 kg/m2  Physical Exam Vitals and nursing note reviewed.  Constitutional:      General: He is not in acute distress.    Appearance: Normal appearance. He is not ill-appearing.  HENT:     Head: Normocephalic and atraumatic.  Eyes:     General:        Right eye: No discharge.        Left eye: No discharge.     Conjunctiva/sclera: Conjunctivae normal.  Cardiovascular:     Rate and Rhythm: Normal rate and regular rhythm.  Pulmonary:     Effort: Pulmonary effort is normal.     Breath sounds: Normal breath sounds. No wheezing, rhonchi or rales.  Genitourinary:    Prostate: Enlarged. No nodules present.  Neurological:     Mental Status: He is alert.  Psychiatric:        Mood and Affect: Mood normal.        Behavior: Behavior normal.     Lab Results  Component Value Date   WBC 6.2  12/21/2021   HGB 15.5 12/21/2021   HCT 47.4 12/21/2021   PLT 187 12/21/2021   GLUCOSE 106 (H) 12/23/2021   CHOL 107 01/26/2021   TRIG 256 (H) 01/26/2021   HDL 37 (L) 01/26/2021   LDLCALC 40 01/26/2021   ALT 88 (H) 12/20/2021   AST 37 12/20/2021   NA 138 12/23/2021   K 3.9 12/23/2021   CL 102 12/23/2021   CREATININE 0.90 12/23/2021   BUN 10 12/23/2021   CO2 28 12/23/2021   TSH 0.90 10/13/2020   INR 0.90 12/19/2017   HGBA1C 6.4 (H) 12/21/2021   MICROALBUR 1.3 11/30/2020     Assessment & Plan:   Problem List Items Addressed This Visit       Cardiovascular and Mediastinum   CAD (coronary artery disease)    Given exertional sweating/diaphoresis I am concerned.  We will arrange follow-up with cardiology.  Advised to rest and avoid significant exertion.  Continue current medications.      Relevant Medications   lisinopril (ZESTRIL) 10 MG tablet   Essential hypertension - Primary    BP well controlled.  Continue lisinopril and metoprolol.      Relevant Medications   lisinopril (ZESTRIL) 10 MG tablet     Respiratory   Emphysema lung (H[facility])    Review of the EMR reflects that he had a prior CT scan which revealed emphysematous changes.  Patient is requesting refill on Combivent which she was previously unable to afford.  Rx refilled.      Relevant Medications   Ipratropium-Albuterol (COMBIVENT) 20-100 MCG/ACT AERS respimat     Endocrine   Hyperlipidemia associated with type 2 diabetes mellitus ([place])    Lipid panel today to reassess.      Relevant Medications   lisinopril (ZESTRIL) 10 MG tablet   Other Relevant Orders   Lipid panel   Type 2 diabetes mellitus with complications ([facility])    Currently off his medication for an unknown reason.  A1c today.      Relevant Medications   lisinopril (ZESTRIL) 10 MG tablet   Other Relevant Orders   CMP14+EGFR   Hemoglobin A1c   Microalbumin / creatinine urine ratio     Other   Benign prostatic hyperplasia with urinary  frequency    Urinalysis today as well as PSA.  Prostate exam reveals enlarged prostate.  I believe that BPH is the culprit for his urinary frequency.  Starting Flomax.      Relevant Orders  Urinalysis, Complete   Other Visit Diagnoses     Screening for deficiency anemia       Relevant Orders   CBC   Screening for prostate cancer       Relevant Orders   PSA   Decreased libido       Relevant Orders   Testosterone Total,Free,Bio, Males   Need for vaccination       Relevant Orders   Flu Vaccine QUAD 6+ mos PF IM (Fluarix Quad PF) (Completed)       Meds ordered this encounter  Medications   Ipratropium-Albuterol (COMBIVENT) 20-100 MCG/ACT AERS respimat    Sig: Inhale 1 puff into the lungs every 6 (six) hours as needed for wheezing or shortness of breath.    Dispense:  4 g    Refill:  1   tamsulosin (FLOMAX) 0.4 MG CAPS capsule    Sig: Take 1 capsule (0.4 mg total) by mouth daily.    Dispense:  90 capsule    Refill:  1    Follow-up:  Return in about 3 months (around 10/14/2022).  [place]

## 2022-07-14 NOTE — Assessment & Plan Note (Signed)
Urinalysis today as well as PSA.  Prostate exam reveals enlarged prostate.  I believe that BPH is the culprit for his urinary frequency.  Starting Flomax.

## 2022-07-15 LAB — CMP14+EGFR
ALT: 34 IU/L (ref 0–44)
AST: 17 IU/L (ref 0–40)
Albumin/Globulin Ratio: 2.3 — ABNORMAL HIGH (ref 1.2–2.2)
Albumin: 5.3 g/dL — ABNORMAL HIGH (ref 3.8–4.9)
Alkaline Phosphatase: 117 IU/L (ref 44–121)
BUN/Creatinine Ratio: 17 (ref 9–20)
BUN: 15 mg/dL (ref 6–24)
Bilirubin Total: 0.6 mg/dL (ref 0.0–1.2)
CO2: 24 mmol/L (ref 20–29)
Calcium: 10.1 mg/dL (ref 8.7–10.2)
Chloride: 95 mmol/L — ABNORMAL LOW (ref 96–106)
Creatinine, Ser: 0.9 mg/dL (ref 0.76–1.27)
Globulin, Total: 2.3 g/dL (ref 1.5–4.5)
Glucose: 373 mg/dL — ABNORMAL HIGH (ref 70–99)
Potassium: 5 mmol/L (ref 3.5–5.2)
Sodium: 136 mmol/L (ref 134–144)
Total Protein: 7.6 g/dL (ref 6.0–8.5)
eGFR: 99 mL/min/{1.73_m2} (ref >59)

## 2022-07-15 LAB — URINALYSIS, COMPLETE
Bilirubin, UA: NEGATIVE
Leukocytes,UA: NEGATIVE
Nitrite, UA: NEGATIVE
Protein,UA: NEGATIVE
RBC, UA: NEGATIVE
Specific Gravity, UA: >=1.03 — AB (ref 1.005–1.030)
Urobilinogen, Ur: 0.2 mg/dL (ref 0.2–1.0)
pH, UA: 5.5 (ref 5.0–7.5)

## 2022-07-15 LAB — CBC
Hematocrit: 51.8 % — ABNORMAL HIGH (ref 37.5–51.0)
Hemoglobin: 16.7 g/dL (ref 13.0–17.7)
MCH: 29.4 pg (ref 26.6–33.0)
MCHC: 32.2 g/dL (ref 31.5–35.7)
MCV: 91 fL (ref 79–97)
Platelets: 241 10*3/uL (ref 150–450)
RBC: 5.68 x10E6/uL (ref 4.14–5.80)
RDW: 11.8 % (ref 11.6–15.4)
WBC: 7.1 10*3/uL (ref 3.4–10.8)

## 2022-07-15 LAB — MICROSCOPIC EXAMINATION
Bacteria, UA: NONE SEEN
Casts: NONE SEEN /lpf
Epithelial Cells (non renal): NONE SEEN /hpf (ref 0–10)
RBC, Urine: NONE SEEN /hpf (ref 0–2)
WBC, UA: NONE SEEN /hpf (ref 0–5)

## 2022-07-15 LAB — LIPID PANEL
Chol/HDL Ratio: 2.7 ratio (ref 0.0–5.0)
Cholesterol, Total: 123 mg/dL (ref 100–199)
HDL: 45 mg/dL (ref >39)
LDL Chol Calc (NIH): 37 mg/dL (ref 0–99)
Triglycerides: 270 mg/dL — ABNORMAL HIGH (ref 0–149)
VLDL Cholesterol Cal: 41 mg/dL — ABNORMAL HIGH (ref 5–40)

## 2022-07-15 LAB — MICROALBUMIN / CREATININE URINE RATIO
Creatinine, Urine: 49.8 mg/dL
Microalb/Creat Ratio: 61 mg/g creat — ABNORMAL HIGH (ref 0–29)
Microalbumin, Urine: 30.4 ug/mL

## 2022-07-15 LAB — HEMOGLOBIN A1C
Est. average glucose Bld gHb Est-mCnc: 278 mg/dL
Hgb A1c MFr Bld: 11.3 % — ABNORMAL HIGH (ref 4.8–5.6)

## 2022-07-15 LAB — PSA: Prostate Specific Ag, Serum: 1.7 ng/mL (ref 0.0–4.0)

## 2022-07-17 ENCOUNTER — Other Ambulatory Visit: Payer: Self-pay | Admitting: Family Medicine

## 2022-07-17 MED ORDER — BLOOD GLUCOSE MONITOR KIT: PACK | 0 refills | Status: DC

## 2022-07-17 MED ORDER — METFORMIN HCL 500 MG PO TABS: 500.0000 mg | ORAL_TABLET | Freq: Two times a day (BID) | ORAL | 3 refills | Status: DC
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
## 2022-07-20 ENCOUNTER — Encounter: Payer: Self-pay | Admitting: Physician Assistant

## 2022-07-20 ENCOUNTER — Ambulatory Visit: Payer: Medicare HMO | Attending: Cardiology | Admitting: Physician Assistant

## 2022-07-20 ENCOUNTER — Telehealth (HOSPITAL_COMMUNITY): Payer: Self-pay | Admitting: *Deleted

## 2022-07-20 VITALS — BP 122/76 | HR 75 | Ht 66.0 in | Wt 197.6 lb

## 2022-07-20 DIAGNOSIS — I2510 Atherosclerotic heart disease of native coronary artery without angina pectoris: Secondary | ICD-10-CM

## 2022-07-20 DIAGNOSIS — E785 Hyperlipidemia, unspecified: Secondary | ICD-10-CM

## 2022-07-20 DIAGNOSIS — I10 Essential (primary) hypertension: Secondary | ICD-10-CM

## 2022-07-20 DIAGNOSIS — R0609 Other forms of dyspnea: Secondary | ICD-10-CM

## 2022-07-20 DIAGNOSIS — R61 Generalized hyperhidrosis: Secondary | ICD-10-CM

## 2022-07-20 MED ORDER — NITROGLYCERIN 0.4 MG SL SUBL: 0.4000 mg | SUBLINGUAL_TABLET | SUBLINGUAL | 3 refills | Status: AC | PRN

## 2022-07-20 NOTE — Telephone Encounter (Signed)
Left message on voicemail per DPR in reference to upcoming appointment scheduled on 07/27/22 at 1000 with detailed instructions given per Myocardial Perfusion Study Information Sheet for the test. LM to arrive 15 minutes early, and that it is imperative to arrive on time for appointment to keep from having the test rescheduled. If you need to cancel or reschedule your appointment, please call the office within 24 hours of your appointment. Failure to do so may result in a cancellation of your appointment, and a $50 no show fee. Phone number given for call back for any questions.  Zita Ozimek, Ranae [place]

## 2022-07-20 NOTE — Patient Instructions (Signed)
Medication Instructions:  No Changes *If you need a refill on your cardiac medications before your next appointment, please call your pharmacy*   Lab Work: No Labs If you have labs (blood work) drawn today and your tests are completely normal, you will receive your results only by: [place] (if you have MyChart) OR A paper copy in the mail If you have any lab test that is abnormal or we need to change your treatment, we will call you to review the results.   Testing/Procedures: [address]. Your physician has requested that you have a lexiscan myoview. For further information please visit [email or web address]. Please follow instruction sheet, as given.   Follow-Up: At [facility], you and your health needs are our priority.  As part of our continuing mission to provide you with exceptional heart care, we have created designated Provider Care Teams.  These Care Teams include your primary Cardiologist (physician) and Advanced Practice Providers (APPs -  Physician Assistants and Nurse Practitioners) who all work together to provide you with the care you need, when you need it.  We recommend signing up for the patient portal called "MyChart".  Sign up information is provided on this After Visit Summary.  MyChart is used to connect with patients for Virtual Visits (Telemedicine).  Patients are able to view lab/test results, encounter notes, upcoming appointments, etc.  Non-urgent messages can be sent to your provider as well.   To learn more about what you can do with MyChart, go to [email or web address].    Your next appointment:   3 month(s)  The format for your next appointment:   In Person  Provider:   Caron Presume, PA-C    Then, Pixie Casino, MD will plan to see you again in 1 year(s).   Important Information About Sugar

## 2022-07-27 ENCOUNTER — Other Ambulatory Visit (HOSPITAL_COMMUNITY): Payer: Self-pay | Admitting: Physician Assistant

## 2022-07-27 ENCOUNTER — Ambulatory Visit (HOSPITAL_COMMUNITY): Payer: Medicare HMO

## 2022-07-27 ENCOUNTER — Encounter (HOSPITAL_COMMUNITY): Payer: Self-pay

## 2022-07-27 ENCOUNTER — Ambulatory Visit (HOSPITAL_COMMUNITY): Payer: Medicare HMO | Attending: Physician Assistant

## 2022-07-27 VITALS — Ht 66.0 in | Wt 197.0 lb

## 2022-07-27 DIAGNOSIS — R0609 Other forms of dyspnea: Secondary | ICD-10-CM

## 2022-07-27 DIAGNOSIS — I2510 Atherosclerotic heart disease of native coronary artery without angina pectoris: Secondary | ICD-10-CM | POA: Diagnosis present

## 2022-07-27 DIAGNOSIS — R9389 Abnormal findings on diagnostic imaging of other specified body structures: Secondary | ICD-10-CM | POA: Insufficient documentation

## 2022-07-27 LAB — MYOCARDIAL PERFUSION IMAGING
LV dias vol: 85 mL (ref 62–150)
LV sys vol: 34 mL
Nuc Stress EF: 60 %
Peak HR: 94 {beats}/min
Rest HR: 70 {beats}/min
Rest Nuclear Isotope Dose: 10.2 mCi
SDS: 0
SRS: 0
SSS: 0
ST Depression (mm): 0 mm
Stress Nuclear Isotope Dose: 30.7 mCi
TID: 1.02

## 2022-07-27 MED ORDER — REGADENOSON 0.4 MG/5ML IV SOLN
0.4000 mg | Freq: Once | INTRAVENOUS | Status: AC
  Administered 2022-07-27: 0.4 mg via INTRAVENOUS

## 2022-07-27 MED ORDER — TECHNETIUM TC 99M TETROFOSMIN IV KIT
29.3000 | PACK | Freq: Once | INTRAVENOUS | Status: AC | PRN
  Administered 2022-07-27: 29.3 via INTRAVENOUS

## 2022-07-27 MED ORDER — TECHNETIUM TC 99M TETROFOSMIN IV KIT
10.7000 | PACK | Freq: Once | INTRAVENOUS | Status: AC | PRN
  Administered 2022-07-27: 10.7 via INTRAVENOUS

## 2022-08-01 ENCOUNTER — Ambulatory Visit (INDEPENDENT_AMBULATORY_CARE_PROVIDER_SITE_OTHER): Payer: Medicare HMO | Admitting: Family Medicine

## 2022-08-01 ENCOUNTER — Telehealth: Payer: Self-pay | Admitting: Family Medicine

## 2022-08-01 VITALS — BP 119/79 | HR 66 | Temp 97.3°F | Ht 66.0 in | Wt 197.0 lb

## 2022-08-01 DIAGNOSIS — M542 Cervicalgia: Secondary | ICD-10-CM | POA: Diagnosis not present

## 2022-08-01 DIAGNOSIS — E118 Type 2 diabetes mellitus with unspecified complications: Secondary | ICD-10-CM

## 2022-08-01 DIAGNOSIS — R809 Proteinuria, unspecified: Secondary | ICD-10-CM

## 2022-08-01 MED ORDER — MELOXICAM 15 MG PO TABS: 15.0000 mg | ORAL_TABLET | Freq: Every day | ORAL | 1 refills | Status: DC | PRN

## 2022-08-01 MED ORDER — EMPAGLIFLOZIN 10 MG PO TABS: 10.0000 mg | ORAL_TABLET | Freq: Every day | ORAL | 3 refills | Status: DC

## 2022-08-01 NOTE — Assessment & Plan Note (Signed)
Uncontrolled/worsening.  Continue metformin as prescribed.  Had a lengthy discussion with the patient today regarding treatment options.  Adding Jardiance.  Will likely need additional pharmacotherapy unless he can make significant lifestyle changes.

## 2022-08-01 NOTE — Telephone Encounter (Signed)
Patient notified

## 2022-08-01 NOTE — Assessment & Plan Note (Signed)
Likely secondary to degenerative changes.  Meloxicam as directed.  Will not use long-term as he has underlying cardiovascular disease.

## 2022-08-01 NOTE — Telephone Encounter (Signed)
Patient was just seen and requesting something for arthritis to be sent to [facility] in [place]

## 2022-08-01 NOTE — Telephone Encounter (Signed)
Left message to return call 

## 2022-08-01 NOTE — Patient Instructions (Signed)
Continue Metformin.  Start [place].  Stress test was normal.  Follow up in 1 month.  Take care  Dr. Lacinda Axon

## 2022-08-01 NOTE — Progress Notes (Signed)
Subjective:  Patient ID: Anthony Hensley, male    DOB: 11-22-1960  Age: 61 y.o. MRN: [id number]  CC: Chief Complaint  Patient presents with   Diabetes    Brought in blood sugars from home    HPI:  61 year old male with hypertension, coronary disease, emphysema, GERD, type 2 diabetes, hyperlipidemia presents for follow-up regarding type 2 diabetes.  Patient's A1c returned at 11.3.  His diabetes is markedly uncontrolled.  His glycemic control is worsening.  He has been checking his blood sugars and they have continued to be elevated in the 300s to 400s.  Fasting was 250 this morning.  He is currently on metformin and dose is being titrated up.  He is here today to discuss additional treatment options to get his A1c at goal.  Additionally, patient reports that he is having ongoing issues with arthritis.  He states that he has a lot of trouble with his neck.  He would like something called in for this.  He takes ibuprofen on a regular basis.  Patient Active Problem List   Diagnosis Date Noted   Microalbuminuria 08/01/2022   Neck pain 08/01/2022   Former smoker 07/14/2022   Abnormal chest CT 07/14/2022   Emphysema lung ([place]) 07/14/2022   Benign prostatic hyperplasia with urinary frequency 07/14/2022   Type 2 diabetes mellitus with complications ([place]) 12/29/2246   Femoral acetabular impingement 01/12/2022   Essential hypertension    Hyperlipidemia associated with type 2 diabetes mellitus ([place]) 11/30/2020   CAD (coronary artery disease) 03/20/2017   GERD (gastroesophageal reflux disease) 06/08/2016   History of colonic polyps     Social Hx   Social History   Socioeconomic History   Marital status: Married    Spouse name: [name]   Number of children: 1   Years of education: Not on file   Highest education level: 9th grade  Occupational History   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  Tobacco Use    Smoking status: Former    Packs/day: 0.75    Years: 35.00    Total pack years: 26.25    Types: Cigarettes    Start date: 08/08/1976    Quit date: 02/21/2018    Years since quitting: 4.4   Smokeless tobacco: Never  Vaping Use   Vaping Use: Never used  Substance and Sexual Activity   Alcohol use: No    Alcohol/week: 0.0 standard drinks of alcohol   Drug use: No   Sexual activity: Not on file  Other Topics Concern   Not on file  Social History Narrative   Lives with wife   Social Determinants of Health   Financial Resource Strain: Not on file  Food Insecurity: Not on file  Transportation Needs: Not on file  Physical Activity: Not on file  Stress: Not on file  Social Connections: Not on file    Review of Systems Per HPI  Objective:  BP 119/79   Pulse 66   Temp (!) 97.3 F (36.3 C)   Ht '5\' 6"'$  (1.676 m)   Wt 197 lb (89.4 kg)   SpO2 96%   BMI 31.80 kg/m      08/01/2022    9:38 AM 07/27/2022   12:58 PM 07/27/2022    9:46 AM  BP/Weight  Systolic BP 250    Diastolic BP 79    Wt. (Lbs) 197 197 197  BMI 31.8 kg/m2 31.8  kg/m2 31.8 kg/m2    Physical Exam Vitals and nursing note reviewed.  Constitutional:      General: He is not in acute distress.    Appearance: Normal appearance.  HENT:     Head: Normocephalic and atraumatic.  Cardiovascular:     Rate and Rhythm: Normal rate and regular rhythm.  Pulmonary:     Effort: Pulmonary effort is normal.     Breath sounds: Normal breath sounds. No wheezing or rales.  Neurological:     Mental Status: He is alert.  Psychiatric:        Mood and Affect: Mood normal.        Behavior: Behavior normal.     Lab Results  Component Value Date   WBC 7.1 07/14/2022   HGB 16.7 07/14/2022   HCT 51.8 (H) 07/14/2022   PLT 241 07/14/2022   GLUCOSE 373 (H) 07/14/2022   CHOL 123 07/14/2022   TRIG 270 (H) 07/14/2022   HDL 45 07/14/2022   LDLCALC 37 07/14/2022   ALT 34 07/14/2022   AST 17 07/14/2022   NA 136 07/14/2022    K 5.0 07/14/2022   CL 95 (L) 07/14/2022   CREATININE 0.90 07/14/2022   BUN 15 07/14/2022   CO2 24 07/14/2022   TSH 0.90 10/13/2020   INR 0.90 12/19/2017   HGBA1C 11.3 (H) 07/14/2022   MICROALBUR 1.3 11/30/2020     Assessment & Plan:   Problem List Items Addressed This Visit       Endocrine   Type 2 diabetes mellitus with complications ([place])    Uncontrolled/worsening.  Continue metformin as prescribed.  Had a lengthy discussion with the patient today regarding treatment options.  Adding Jardiance.  Will likely need additional pharmacotherapy unless he can make significant lifestyle changes.      Relevant Medications   empagliflozin (JARDIANCE) 10 MG TABS tablet     Other   Neck pain    Likely secondary to degenerative changes.  Meloxicam as directed.  Will not use long-term as he has underlying cardiovascular disease.      Microalbuminuria    Meds ordered this encounter  Medications   empagliflozin (JARDIANCE) 10 MG TABS tablet    Sig: Take 1 tablet (10 mg total) by mouth daily before breakfast.    Dispense:  90 tablet    Refill:  3   meloxicam (MOBIC) 15 MG tablet    Sig: Take 1 tablet (15 mg total) by mouth daily as needed for pain.    Dispense:  30 tablet    Refill:  1    Follow-up:  Return in about 1 month (around 09/01/2022).  [place]

## 2022-08-01 NOTE — Telephone Encounter (Signed)
Coral Spikes, DO     I sent in Mobic for arthritis.

## 2022-08-02 ENCOUNTER — Telehealth: Payer: Self-pay

## 2022-08-02 NOTE — Telephone Encounter (Addendum)
Called patient regarding results. Left message for patient to call office.----- Message from Warren Lacy, PA-C sent at 08/01/2022  9:02 AM EDT ----- Stress test shows no evidence of ischemia. There is no evidence of infarction.  Heart squeezes normally.  The study is normal. The study is low risk. PLAN: Symptoms may be related to poorly controlled diabetes.  Follow-up with PCP regarding diabetic management.

## 2022-08-04 ENCOUNTER — Encounter: Payer: Self-pay | Admitting: Family Medicine

## 2022-08-08 ENCOUNTER — Ambulatory Visit (INDEPENDENT_AMBULATORY_CARE_PROVIDER_SITE_OTHER): Payer: Medicare HMO | Admitting: Family Medicine

## 2022-08-08 ENCOUNTER — Encounter: Payer: Self-pay | Admitting: Family Medicine

## 2022-08-08 VITALS — BP 130/86 | HR 90 | Wt 198.8 lb

## 2022-08-08 DIAGNOSIS — R9389 Abnormal findings on diagnostic imaging of other specified body structures: Secondary | ICD-10-CM

## 2022-08-08 DIAGNOSIS — J439 Emphysema, unspecified: Secondary | ICD-10-CM

## 2022-08-08 DIAGNOSIS — E118 Type 2 diabetes mellitus with unspecified complications: Secondary | ICD-10-CM

## 2022-08-08 DIAGNOSIS — Z87891 Personal history of nicotine dependence: Secondary | ICD-10-CM | POA: Diagnosis not present

## 2022-08-08 DIAGNOSIS — K219 Gastro-esophageal reflux disease without esophagitis: Secondary | ICD-10-CM

## 2022-08-08 MED ORDER — PANTOPRAZOLE SODIUM 40 MG PO TBEC: 40.0000 mg | DELAYED_RELEASE_TABLET | Freq: Every day | ORAL | 1 refills | Status: DC

## 2022-08-08 NOTE — Progress Notes (Signed)
Subjective:  Patient ID: Anthony Hensley, male    DOB: 1961-02-18  Age: 61 y.o. MRN: [id number]  CC: Chief Complaint  Patient presents with   Diabetes    Sugars have been a little bit better-most of readings in the 100s. Pt would like something for acid reflux    HPI:  61 year old male with HTN, CAD, COPD, GERD presents for follow up.  Sugars slowly improving. Still has highs later in the day (suspect that this is related to his diet). Compliant with Metformin and Jardiance. Declined insulin and GLP at last visit.   Reports ongoing GERD. Requesting medication for this.   Meets criteria for CT lung cancer screening and has had a prior nodule that needs follow up. Amendable to getting CT.  Patient Active Problem List   Diagnosis Date Noted   Microalbuminuria 08/01/2022   Neck pain 08/01/2022   Former smoker 07/14/2022   Abnormal chest CT 07/14/2022   Emphysema lung ([place]) 07/14/2022   Benign prostatic hyperplasia with urinary frequency 07/14/2022   Type 2 diabetes mellitus with complications ([place]) 49/67/5916   Femoral acetabular impingement 01/12/2022   Essential hypertension    Hyperlipidemia associated with type 2 diabetes mellitus ([place]) 11/30/2020   CAD (coronary artery disease) 03/20/2017   GERD (gastroesophageal reflux disease) 06/08/2016   History of colonic polyps     Social Hx   Social History   Socioeconomic History   Marital status: Married    Spouse name: [name]   Number of children: 1   Years of education: Not on file   Highest education level: 9th grade  Occupational History   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  Tobacco Use   Smoking status: Former    Packs/day: 0.75    Years: 35.00    Total pack years: 26.25    Types: Cigarettes    Start date: 08/08/1976    Quit date: 02/21/2018    Years since quitting: 4.4   Smokeless tobacco: Never  Vaping Use   Vaping Use: Never  used  Substance and Sexual Activity   Alcohol use: No    Alcohol/week: 0.0 standard drinks of alcohol   Drug use: No   Sexual activity: Not on file  Other Topics Concern   Not on file  Social History Narrative   Lives with wife   Social Determinants of Health   Financial Resource Strain: Not on file  Food Insecurity: Not on file  Transportation Needs: Not on file  Physical Activity: Not on file  Stress: Not on file  Social Connections: Not on file    Review of Systems  Constitutional: Negative.   Respiratory: Negative.      Objective:  BP 130/86   Pulse 90   Wt 198 lb 12.8 oz (90.2 kg)   SpO2 97%   BMI 32.09 kg/m      08/08/2022    1:07 PM 08/01/2022    9:38 AM 07/27/2022   12:58 PM  BP/Weight  Systolic BP 384 665   Diastolic BP 86 79   Wt. (Lbs) 198.8 197 197  BMI 32.09 kg/m2 31.8 kg/m2 31.8 kg/m2    Physical Exam Vitals and nursing note reviewed.  Constitutional:      General: He is not in acute distress.    Appearance: Normal appearance.  HENT:     Head: Normocephalic and atraumatic.  Eyes:  General:        Right eye: No discharge.        Left eye: No discharge.     Conjunctiva/sclera: Conjunctivae normal.  Cardiovascular:     Rate and Rhythm: Normal rate and regular rhythm.  Pulmonary:     Effort: Pulmonary effort is normal.     Breath sounds: Normal breath sounds. No wheezing or rales.  Neurological:     Mental Status: He is alert.  Psychiatric:        Mood and Affect: Mood normal.        Behavior: Behavior normal.   Diabetic Foot Check -  Appearance - no lesions, ulcers or calluses Skin - no unusual pallor or redness Monofilament testing -  Right - Great toe, medial, central, lateral ball and posterior foot intact Left - Great toe, medial, central, lateral ball and posterior foot intact   Lab Results  Component Value Date   WBC 7.1 07/14/2022   HGB 16.7 07/14/2022   HCT 51.8 (H) 07/14/2022   PLT 241 07/14/2022   GLUCOSE 373  (H) 07/14/2022   CHOL 123 07/14/2022   TRIG 270 (H) 07/14/2022   HDL 45 07/14/2022   LDLCALC 37 07/14/2022   ALT 34 07/14/2022   AST 17 07/14/2022   NA 136 07/14/2022   K 5.0 07/14/2022   CL 95 (L) 07/14/2022   CREATININE 0.90 07/14/2022   BUN 15 07/14/2022   CO2 24 07/14/2022   TSH 0.90 10/13/2020   INR 0.90 12/19/2017   HGBA1C 11.3 (H) 07/14/2022   MICROALBUR 1.3 11/30/2020     Assessment & Plan:   Problem List Items Addressed This Visit       Respiratory   Emphysema lung ([place])   Relevant Orders   CT CHEST LUNG CA SCREEN LOW DOSE W/O CM     Digestive   GERD (gastroesophageal reflux disease)    Uncontrolled. Starting Protonix.       Relevant Medications   pantoprazole (PROTONIX) 40 MG tablet     Endocrine   Type 2 diabetes mellitus with complications ([place]) - Primary    Control slowly improving. Advised to watch diet. Continue Metformin and Jardiance.        Other   Former smoker    Obtaining CT lung cancer screening.       Relevant Orders   CT CHEST LUNG CA SCREEN LOW DOSE W/O CM   Abnormal chest CT   Relevant Orders   CT CHEST LUNG CA SCREEN LOW DOSE W/O CM    Meds ordered this encounter  Medications   pantoprazole (PROTONIX) 40 MG tablet    Sig: Take 1 tablet (40 mg total) by mouth daily.    Dispense:  90 tablet    Refill:  1    Follow-up:  Return in about 3 months (around 11/08/2022).  [place]

## 2022-08-08 NOTE — Assessment & Plan Note (Signed)
Control slowly improving. Advised to watch diet. Continue Metformin and Jardiance.

## 2022-08-08 NOTE — Assessment & Plan Note (Signed)
Obtaining CT lung cancer screening.

## 2022-08-08 NOTE — Assessment & Plan Note (Signed)
Uncontrolled. Starting Protonix.

## 2022-08-08 NOTE — Patient Instructions (Signed)
Medication as prescribed.  Follow up in 3 months.  Take care  Dr. Lacinda Axon

## 2022-08-15 ENCOUNTER — Other Ambulatory Visit: Payer: Self-pay | Admitting: Family Medicine

## 2022-08-16 ENCOUNTER — Other Ambulatory Visit: Payer: Self-pay | Admitting: Family Medicine

## 2022-08-16 MED ORDER — BLOOD GLUCOSE METER KIT: PACK | 0 refills | Status: DC

## 2022-08-19 ENCOUNTER — Ambulatory Visit: Payer: Medicare HMO | Admitting: Internal Medicine

## 2022-08-22 ENCOUNTER — Other Ambulatory Visit: Payer: Self-pay | Admitting: Family Medicine

## 2022-08-22 ENCOUNTER — Ambulatory Visit: Payer: Medicare HMO | Admitting: Internal Medicine

## 2022-09-05 ENCOUNTER — Ambulatory Visit: Payer: Medicare HMO | Admitting: Family Medicine

## 2022-09-06 ENCOUNTER — Other Ambulatory Visit: Payer: Self-pay | Admitting: Family Medicine

## 2022-09-16 ENCOUNTER — Telehealth: Payer: Self-pay

## 2022-09-16 NOTE — Telephone Encounter (Signed)
Called patient regarding results left message . Letter mailed 12/8

## 2022-09-28 ENCOUNTER — Telehealth: Payer: Self-pay | Admitting: Physician Assistant

## 2022-09-28 NOTE — Telephone Encounter (Signed)
Called pt [facility] about return mail. 12.20.2023 BM

## 2022-10-04 ENCOUNTER — Other Ambulatory Visit: Payer: Self-pay | Admitting: Family Medicine

## 2022-10-06 ENCOUNTER — Other Ambulatory Visit: Payer: Self-pay | Admitting: Family Medicine

## 2022-10-06 ENCOUNTER — Other Ambulatory Visit: Payer: Self-pay | Admitting: Internal Medicine

## 2022-10-07 ENCOUNTER — Other Ambulatory Visit: Payer: Self-pay | Admitting: Family Medicine

## 2022-10-14 ENCOUNTER — Ambulatory Visit (INDEPENDENT_AMBULATORY_CARE_PROVIDER_SITE_OTHER): Payer: Medicare HMO | Admitting: Family Medicine

## 2022-10-14 DIAGNOSIS — M7918 Myalgia, other site: Secondary | ICD-10-CM | POA: Diagnosis not present

## 2022-10-14 DIAGNOSIS — I2510 Atherosclerotic heart disease of native coronary artery without angina pectoris: Secondary | ICD-10-CM | POA: Diagnosis not present

## 2022-10-14 DIAGNOSIS — I10 Essential (primary) hypertension: Secondary | ICD-10-CM

## 2022-10-14 DIAGNOSIS — E118 Type 2 diabetes mellitus with unspecified complications: Secondary | ICD-10-CM

## 2022-10-14 MED ORDER — TRAMADOL HCL 50 MG PO TABS: 50.0000 mg | ORAL_TABLET | Freq: Three times a day (TID) | ORAL | 0 refills | Status: DC | PRN

## 2022-10-14 NOTE — Patient Instructions (Signed)
Medication as directed for your pain. You can stop the Meloxicam.  Lab today to check A1C.  Follow up in 3 months.

## 2022-10-14 NOTE — Assessment & Plan Note (Signed)
Well-controlled on lisinopril and metoprolol.  Continue.

## 2022-10-14 NOTE — Assessment & Plan Note (Signed)
No tenderness over the trochanter.  I suspect that this is more muscular in origin.  Could be coming from the lumbar spine.  However, he had imaging of the lumbar spine earlier in 2023 which was unremarkable.  Tramadol as needed.  Stopping meloxicam.

## 2022-10-14 NOTE — Assessment & Plan Note (Signed)
Blood sugars have been improving.  A1c today.  Continue Jardiance and metformin.

## 2022-10-14 NOTE — Assessment & Plan Note (Signed)
Asymptomatic currently.  Continue current medication.

## 2022-10-14 NOTE — Progress Notes (Signed)
Subjective:  Patient ID: Anthony Hensley, male    DOB: December 01, 1960  Age: 62 y.o. MRN: [id number]  CC: Chief Complaint  Patient presents with   Diabetes    Pt arrives for follow up on DM and HTN. Pt states he believes that sometime his machine is not reading right; highest sugar has been 234 and that has been in the last few days. Pt unsure of why numbers are jumping around. Checking sugars once in AM and right before bed    HPI:  62 year old male with hypertension, coronary disease, emphysema, GERD, type 2 diabetes, hyperlipidemia, microalbuminuria, BPH presents for follow-up.  Blood sugars have improved.  Blood sugars are mainly in the 120s when he checks in the morning and in the evening.  He is compliant with metformin and Jardiance.  No hypoglycemia.  Patient's blood pressure is well-controlled on metoprolol and lisinopril.  No reported side effects.  No chest pain.  Patient has some underlying shortness of breath due to long history of smoking.  At baseline.  Patient reports ongoing pain in the right hip.  He states that it gives way at times.  He localizes the pain to the lateral hip and buttock.  No radicular symptoms.  Has not improved with meloxicam.  Patient uses a cane.  Patient Active Problem List   Diagnosis Date Noted   Musculoskeletal pain 10/14/2022   Microalbuminuria 08/01/2022   Former smoker 07/14/2022   Emphysema lung ([place]) 07/14/2022   Benign prostatic hyperplasia with urinary frequency 07/14/2022   Type 2 diabetes mellitus with complications ([place]) 53/61/4431   Essential hypertension    Hyperlipidemia associated with type 2 diabetes mellitus ([place]) 11/30/2020   CAD (coronary artery disease) 03/20/2017   GERD (gastroesophageal reflux disease) 06/08/2016    Social Hx   Social History   Socioeconomic History   Marital status: Married    Spouse name: [name]   Number of children: 1   Years of education: Not on file   Highest education level: 9th grade   Occupational History   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  Tobacco Use   Smoking status: Former    Packs/day: 0.75    Years: 35.00    Total pack years: 26.25    Types: Cigarettes    Start date: 08/08/1976    Quit date: 02/21/2018    Years since quitting: 4.6   Smokeless tobacco: Never  Vaping Use   Vaping Use: Never used  Substance and Sexual Activity   Alcohol use: No    Alcohol/week: 0.0 standard drinks of alcohol   Drug use: No   Sexual activity: Not on file  Other Topics Concern   Not on file  Social History Narrative   Lives with wife   Social Determinants of Health   Financial Resource Strain: Not on file  Food Insecurity: Not on file  Transportation Needs: Not on file  Physical Activity: Not on file  Stress: Not on file  Social Connections: Not on file    Review of Systems Per HPI  Objective:  BP 130/82   Pulse 88   Temp (!) 97.5 F (36.4 C)   Wt 201 lb 9.6 oz (91.4 kg)   SpO2 97%   BMI 32.54 kg/m      10/14/2022    9:15 AM 08/08/2022    1:07 PM 08/01/2022    9:38 AM  BP/Weight  Systolic  BP 169 678 938  Diastolic BP 82 86 79  Wt. (Lbs) 201.6 198.8 197  BMI 32.54 kg/m2 32.09 kg/m2 31.8 kg/m2    Physical Exam Vitals and nursing note reviewed.  Constitutional:      General: He is not in acute distress.    Appearance: Normal appearance.  HENT:     Head: Normocephalic and atraumatic.  Eyes:     General:        Right eye: No discharge.        Left eye: No discharge.     Conjunctiva/sclera: Conjunctivae normal.  Cardiovascular:     Rate and Rhythm: Normal rate and regular rhythm.  Pulmonary:     Effort: Pulmonary effort is normal.     Breath sounds: Normal breath sounds. No wheezing, rhonchi or rales.  Abdominal:     Palpations: Abdomen is soft.     Tenderness: There is no abdominal tenderness.  Neurological:     Mental Status: He is alert.  Psychiatric:         Mood and Affect: Mood normal.        Behavior: Behavior normal.     Lab Results  Component Value Date   WBC 7.1 07/14/2022   HGB 16.7 07/14/2022   HCT 51.8 (H) 07/14/2022   PLT 241 07/14/2022   GLUCOSE 373 (H) 07/14/2022   CHOL 123 07/14/2022   TRIG 270 (H) 07/14/2022   HDL 45 07/14/2022   LDLCALC 37 07/14/2022   ALT 34 07/14/2022   AST 17 07/14/2022   NA 136 07/14/2022   K 5.0 07/14/2022   CL 95 (L) 07/14/2022   CREATININE 0.90 07/14/2022   BUN 15 07/14/2022   CO2 24 07/14/2022   TSH 0.90 10/13/2020   INR 0.90 12/19/2017   HGBA1C 11.3 (H) 07/14/2022   MICROALBUR 1.3 11/30/2020     Assessment & Plan:   Problem List Items Addressed This Visit       Cardiovascular and Mediastinum   Essential hypertension    Well-controlled on lisinopril and metoprolol.  Continue.      CAD (coronary artery disease)    Asymptomatic currently.  Continue current medication.        Endocrine   Type 2 diabetes mellitus with complications ([facility])    Blood sugars have been improving.  A1c today.  Continue Jardiance and metformin.      Relevant Orders   Hemoglobin A1c     Other   Musculoskeletal pain    No tenderness over the trochanter.  I suspect that this is more muscular in origin.  Could be coming from the lumbar spine.  However, he had imaging of the lumbar spine earlier in 2023 which was unremarkable.  Tramadol as needed.  Stopping meloxicam.       Meds ordered this encounter  Medications   traMADol (ULTRAM) 50 MG tablet    Sig: Take 1-2 tablets (50-100 mg total) by mouth every 8 (eight) hours as needed for up to 5 days for moderate pain or severe pain.    Dispense:  30 tablet    Refill:  0    Follow-up:  Return in about 3 months (around 01/13/2023).  [place]

## 2022-10-15 LAB — HEMOGLOBIN A1C
Est. average glucose Bld gHb Est-mCnc: 154 mg/dL
Hgb A1c MFr Bld: 7 % — ABNORMAL HIGH (ref 4.8–5.6)
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
## 2022-10-26 ENCOUNTER — Ambulatory Visit: Payer: Medicare HMO | Attending: Physician Assistant | Admitting: Physician Assistant

## 2022-10-26 ENCOUNTER — Encounter: Payer: Self-pay | Admitting: Physician Assistant

## 2022-10-26 VITALS — BP 106/68 | HR 68 | Ht 66.0 in | Wt 197.2 lb

## 2022-10-26 DIAGNOSIS — I2510 Atherosclerotic heart disease of native coronary artery without angina pectoris: Secondary | ICD-10-CM

## 2022-10-26 DIAGNOSIS — I10 Essential (primary) hypertension: Secondary | ICD-10-CM

## 2022-10-26 DIAGNOSIS — E785 Hyperlipidemia, unspecified: Secondary | ICD-10-CM

## 2022-10-26 NOTE — Patient Instructions (Signed)
Medication Instructions:  No Changes *If you need a refill on your cardiac medications before your next appointment, please call your pharmacy*   Lab Work: No Labs If you have labs (blood work) drawn today and your tests are completely normal, you will receive your results only by: [place] (if you have MyChart) OR A paper copy in the mail If you have any lab test that is abnormal or we need to change your treatment, we will call you to review the results.   Testing/Procedures: No Testing   Follow-Up: At [facility], you and your health needs are our priority.  As part of our continuing mission to provide you with exceptional heart care, we have created designated Provider Care Teams.  These Care Teams include your primary Cardiologist (physician) and Advanced Practice Providers (APPs -  Physician Assistants and Nurse Practitioners) who all work together to provide you with the care you need, when you need it.  We recommend signing up for the patient portal called "MyChart".  Sign up information is provided on this After Visit Summary.  MyChart is used to connect with patients for Virtual Visits (Telemedicine).  Patients are able to view lab/test results, encounter notes, upcoming appointments, etc.  Non-urgent messages can be sent to your provider as well.   To learn more about what you can do with MyChart, go to [email or web address].    Your next appointment:   1 year(s)  Provider:   Pixie Casino, MD

## 2022-11-03 ENCOUNTER — Other Ambulatory Visit: Payer: Self-pay | Admitting: Family Medicine

## 2022-11-08 ENCOUNTER — Ambulatory Visit: Payer: Medicare HMO | Admitting: Family Medicine

## 2022-11-11 ENCOUNTER — Other Ambulatory Visit: Payer: Self-pay | Admitting: Family Medicine

## 2022-12-06 ENCOUNTER — Other Ambulatory Visit: Payer: Self-pay | Admitting: Family Medicine

## 2023-01-10 ENCOUNTER — Other Ambulatory Visit: Payer: Self-pay

## 2023-01-10 ENCOUNTER — Other Ambulatory Visit: Payer: Self-pay | Admitting: Family Medicine

## 2023-01-10 MED ORDER — LISINOPRIL 10 MG PO TABS: 10.0000 mg | ORAL_TABLET | Freq: Every day | ORAL | 3 refills | Status: DC

## 2023-01-10 MED ORDER — ROSUVASTATIN CALCIUM 40 MG PO TABS: ORAL_TABLET | ORAL | 3 refills | Status: DC

## 2023-01-13 ENCOUNTER — Ambulatory Visit: Payer: Medicare HMO | Admitting: Family Medicine

## 2023-01-16 ENCOUNTER — Ambulatory Visit (INDEPENDENT_AMBULATORY_CARE_PROVIDER_SITE_OTHER): Payer: Medicare HMO | Admitting: Family Medicine

## 2023-01-16 VITALS — BP 110/72 | HR 80 | Temp 97.7°F | Ht 66.0 in | Wt 201.0 lb

## 2023-01-16 DIAGNOSIS — I10 Essential (primary) hypertension: Secondary | ICD-10-CM

## 2023-01-16 DIAGNOSIS — E1169 Type 2 diabetes mellitus with other specified complication: Secondary | ICD-10-CM | POA: Diagnosis not present

## 2023-01-16 DIAGNOSIS — E118 Type 2 diabetes mellitus with unspecified complications: Secondary | ICD-10-CM | POA: Diagnosis not present

## 2023-01-16 DIAGNOSIS — R809 Proteinuria, unspecified: Secondary | ICD-10-CM

## 2023-01-16 DIAGNOSIS — E785 Hyperlipidemia, unspecified: Secondary | ICD-10-CM

## 2023-01-16 DIAGNOSIS — M545 Low back pain, unspecified: Secondary | ICD-10-CM

## 2023-01-16 DIAGNOSIS — W57XXXA Bitten or stung by nonvenomous insect and other nonvenomous arthropods, initial encounter: Secondary | ICD-10-CM | POA: Insufficient documentation

## 2023-01-16 MED ORDER — CELECOXIB 100 MG PO CAPS: 100.0000 mg | ORAL_CAPSULE | Freq: Two times a day (BID) | ORAL | 1 refills | Status: DC | PRN

## 2023-01-16 NOTE — Patient Instructions (Addendum)
Labs today to recheck urine and A1C.  Stop Tramadol.  Use the Celebrex as needed.  Follow up in 3 months.

## 2023-01-16 NOTE — Progress Notes (Signed)
Subjective:  Patient ID: Anthony Hensley, male    DOB: 1961-06-22  Age: 62 y.o. MRN: [id number]  CC: Chief Complaint  Patient presents with   Diabetes    3 month follow up blood sugars around low 100's  Tramadol causes trouble sleeping Tick bite    HPI:  62 year old male with HTN, CAD, and for MI, GERD, hyperlipidemia, type 2 diabetes, BPH, microalbuminuria presents for follow-up.  Patient reports that his blood sugar readings have been well-controlled.  He is compliant with Jardiance and metformin.  Hypertension well-controlled on lisinopril and metoprolol.  LDL has remained at goal on Crestor and Zetia.  Patient reports ongoing back pain.  He states that tramadol helps causes insomnia.  He would like something else.  Patient also reports that he recently suffered a tick bite to the right axilla.  He would like me to examine it today.  Patient Active Problem List   Diagnosis Date Noted   Tick bite 01/16/2023   Microalbuminuria 08/01/2022   Former smoker 07/14/2022   Emphysema lung 07/14/2022   Benign prostatic hyperplasia with urinary frequency 07/14/2022   Type 2 diabetes mellitus with complications 01/12/2022   Low back pain 12/23/2021   Essential hypertension    Hyperlipidemia associated with type 2 diabetes mellitus 11/30/2020   CAD (coronary artery disease) 03/20/2017   GERD (gastroesophageal reflux disease) 06/08/2016    Social Hx   Social History   Socioeconomic History   Marital status: Married    Spouse name: Junious Dresser   Number of children: 1   Years of education: Not on file   Highest education level: 9th grade  Occupational History   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  Tobacco Use   Smoking status: Former    Packs/day: 0.75    Years: 35.00    Additional pack years: 0.00    Total pack years: 26.25    Types: Cigarettes    Start date: 08/08/1976    Quit date:  02/21/2018    Years since quitting: 4.9   Smokeless tobacco: Never  Vaping Use   Vaping Use: Never used  Substance and Sexual Activity   Alcohol use: No    Alcohol/week: 0.0 standard drinks of alcohol   Drug use: No   Sexual activity: Not on file  Other Topics Concern   Not on file  Social History Narrative   Lives with wife   Social Determinants of Health   Financial Resource Strain: Not on file  Food Insecurity: Not on file  Transportation Needs: Not on file  Physical Activity: Not on file  Stress: Not on file  Social Connections: Not on file    Review of Systems Per HPI  Objective:  BP 110/72   Pulse 80   Temp 97.7 F (36.5 C)   Ht 5\' 6"  (1.676 m)   Wt 201 lb (91.2 kg)   SpO2 99%   BMI 32.44 kg/m      01/16/2023    8:24 AM 10/26/2022    8:55 AM 10/14/2022    9:15 AM  BP/Weight  Systolic BP 110 106 130  Diastolic BP 72 68 82  Wt. (Lbs) 201 197.2 201.6  BMI 32.44 kg/m2 31.83 kg/m2 32.54 kg/m2    Physical Exam Vitals and nursing note reviewed.  Constitutional:      General: He is not in acute distress.    Appearance: Normal  appearance.  HENT:     Head: Normocephalic and atraumatic.  Cardiovascular:     Rate and Rhythm: Normal rate and regular rhythm.  Pulmonary:     Effort: Pulmonary effort is normal.     Breath sounds: Normal breath sounds. No wheezing or rales.  Skin:    Comments: Right axilla with a small area of erythema with central eschar.  Neurological:     Mental Status: He is alert.  Psychiatric:        Mood and Affect: Mood normal.        Behavior: Behavior normal.     Lab Results  Component Value Date   WBC 7.1 07/14/2022   HGB 16.7 07/14/2022   HCT 51.8 (H) 07/14/2022   PLT 241 07/14/2022   GLUCOSE 373 (H) 07/14/2022   CHOL 123 07/14/2022   TRIG 270 (H) 07/14/2022   HDL 45 07/14/2022   LDLCALC 37 07/14/2022   ALT 34 07/14/2022   AST 17 07/14/2022   NA 136 07/14/2022   K 5.0 07/14/2022   CL 95 (L) 07/14/2022   CREATININE  0.90 07/14/2022   BUN 15 07/14/2022   CO2 24 07/14/2022   TSH 0.90 10/13/2020   INR 0.90 12/19/2017   HGBA1C 7.0 (H) 10/14/2022   MICROALBUR 1.3 11/30/2020     Assessment & Plan:   Problem List Items Addressed This Visit       Cardiovascular and Mediastinum   Essential hypertension    BP at goal.  Continue lisinopril and metoprolol.        Endocrine   Type 2 diabetes mellitus with complications - Primary    A1c today to reassess.  Continue metformin and Jardiance. Patient states that he is going to schedule an eye exam. Rechecking microalbumin today.      Relevant Orders   Hemoglobin A1c   Microalbumin / creatinine urine ratio   Hyperlipidemia associated with type 2 diabetes mellitus    LDL has been at goal.  Continue Crestor and Zetia.        Musculoskeletal and Integument   Tick bite    No evidence of erythema migrans.  No evidence of infection.  Supportive care.        Other   Microalbuminuria   Relevant Orders   Microalbumin / creatinine urine ratio   Low back pain    Stopping tramadol due to insomnia.  Starting Celebrex.      Relevant Medications   celecoxib (CELEBREX) 100 MG capsule    Meds ordered this encounter  Medications   celecoxib (CELEBREX) 100 MG capsule    Sig: Take 1 capsule (100 mg total) by mouth 2 (two) times daily as needed for moderate pain.    Dispense:  30 capsule    Refill:  1    Follow-up:  Return in about 3 months (around 04/17/2023).  Everlene Other DO [facility]

## 2023-01-16 NOTE — Assessment & Plan Note (Addendum)
A1c today to reassess.  Continue metformin and Jardiance. Patient states that he is going to schedule an eye exam. Rechecking microalbumin today.

## 2023-01-16 NOTE — Assessment & Plan Note (Signed)
LDL has been at goal.  Continue Crestor and Zetia.

## 2023-01-16 NOTE — Assessment & Plan Note (Signed)
No evidence of erythema migrans.  No evidence of infection.  Supportive care.

## 2023-01-16 NOTE — Assessment & Plan Note (Signed)
Stopping tramadol due to insomnia.  Starting Celebrex.

## 2023-01-16 NOTE — Assessment & Plan Note (Signed)
BP at goal.  Continue lisinopril and metoprolol.

## 2023-01-17 LAB — MICROALBUMIN / CREATININE URINE RATIO
Creatinine, Urine: 102.1 mg/dL
Microalb/Creat Ratio: 11 mg/g creat (ref 0–29)
Microalbumin, Urine: 11.7 ug/mL

## 2023-01-17 LAB — HEMOGLOBIN A1C
Est. average glucose Bld gHb Est-mCnc: 131 mg/dL
Hgb A1c MFr Bld: 6.2 % — ABNORMAL HIGH (ref 4.8–5.6)

## 2023-02-08 ENCOUNTER — Other Ambulatory Visit: Payer: Self-pay | Admitting: Family Medicine

## 2023-02-17 ENCOUNTER — Encounter: Payer: Medicare HMO | Admitting: Family Medicine

## 2023-02-21 ENCOUNTER — Other Ambulatory Visit: Payer: Self-pay | Admitting: Family Medicine

## 2023-02-24 ENCOUNTER — Other Ambulatory Visit: Payer: Self-pay | Admitting: Family Medicine

## 2023-03-09 ENCOUNTER — Other Ambulatory Visit: Payer: Self-pay | Admitting: Family Medicine

## 2023-03-13 ENCOUNTER — Other Ambulatory Visit: Payer: Self-pay | Admitting: Family Medicine

## 2023-04-06 ENCOUNTER — Other Ambulatory Visit: Payer: Self-pay | Admitting: Family Medicine

## 2023-04-27 ENCOUNTER — Ambulatory Visit (INDEPENDENT_AMBULATORY_CARE_PROVIDER_SITE_OTHER): Payer: Medicare HMO | Admitting: Family Medicine

## 2023-04-27 VITALS — BP 109/73 | HR 70 | Temp 97.6°F | Ht 66.0 in | Wt 195.0 lb

## 2023-04-27 DIAGNOSIS — E118 Type 2 diabetes mellitus with unspecified complications: Secondary | ICD-10-CM | POA: Diagnosis not present

## 2023-04-27 DIAGNOSIS — M25512 Pain in left shoulder: Secondary | ICD-10-CM

## 2023-04-27 DIAGNOSIS — M25562 Pain in left knee: Secondary | ICD-10-CM

## 2023-04-27 DIAGNOSIS — E1169 Type 2 diabetes mellitus with other specified complication: Secondary | ICD-10-CM

## 2023-04-27 DIAGNOSIS — M25561 Pain in right knee: Secondary | ICD-10-CM | POA: Diagnosis not present

## 2023-04-27 DIAGNOSIS — M25511 Pain in right shoulder: Secondary | ICD-10-CM | POA: Diagnosis not present

## 2023-04-27 DIAGNOSIS — Z130 Encounter for screening for diseases of the blood and blood-forming organs and certain disorders involving the immune mechanism: Secondary | ICD-10-CM

## 2023-04-27 DIAGNOSIS — I10 Essential (primary) hypertension: Secondary | ICD-10-CM

## 2023-04-27 DIAGNOSIS — I2510 Atherosclerotic heart disease of native coronary artery without angina pectoris: Secondary | ICD-10-CM

## 2023-04-27 DIAGNOSIS — G8929 Other chronic pain: Secondary | ICD-10-CM

## 2023-04-27 DIAGNOSIS — E785 Hyperlipidemia, unspecified: Secondary | ICD-10-CM

## 2023-04-27 MED ORDER — DICLOFENAC SODIUM 75 MG PO TBEC: 75.0000 mg | DELAYED_RELEASE_TABLET | Freq: Two times a day (BID) | ORAL | 0 refills | Status: DC | PRN

## 2023-04-27 NOTE — Assessment & Plan Note (Signed)
Diclofenac as prescribed.  Stopping Celebrex.  Referral placed to orthopedics.

## 2023-04-27 NOTE — Assessment & Plan Note (Signed)
At goal.  Continue Jardiance and metformin.

## 2023-04-27 NOTE — Assessment & Plan Note (Signed)
Stable.  Continue current medications.

## 2023-04-27 NOTE — Assessment & Plan Note (Signed)
Has been at goal.  Repeating lipid panel today.  Continue Crestor and Zetia.

## 2023-04-27 NOTE — Progress Notes (Signed)
Subjective:  Patient ID: Anthony Hensley, male    DOB: 28-Oct-1960  Age: 62 y.o. MRN: [id number]  CC: Chief Complaint  Patient presents with   Diabetes   Hyperlipidemia    HPI:  62 year old male with coronary artery disease, hypertension, emphysema, GERD, hyperlipidemia, type 2 diabetes, BPH presents for follow-up.  Patient reports ongoing pain in the shoulders and the upper back.  He also reports ongoing knee pain.  Celebrex not helping significantly.  He was previously seen by orthopedics.  Fasting blood sugars at goal.  Most recent A1c in April was 6.2.  He is doing well on metformin and Jardiance.  Hypertension stable on lisinopril and metoprolol.  Denies chest pain.  He has some shortness of breath with vigorous activity.  LDL has been at goal on Crestor and Zetia.  Patient Active Problem List   Diagnosis Date Noted   Chronic pain of both knees 04/27/2023   Chronic pain of both shoulders 04/27/2023   Microalbuminuria 08/01/2022   Former smoker 07/14/2022   Emphysema lung (HCC) 07/14/2022   Benign prostatic hyperplasia with urinary frequency 07/14/2022   Type 2 diabetes mellitus with complications (HCC) 01/12/2022   Low back pain 12/23/2021   Essential hypertension    Hyperlipidemia associated with type 2 diabetes mellitus (HCC) 11/30/2020   CAD (coronary artery disease) 03/20/2017   GERD (gastroesophageal reflux disease) 06/08/2016    Social Hx   Social History   Socioeconomic History   Marital status: Married    Spouse name: Junious Dresser   Number of children: 1   Years of education: Not on file   Highest education level: 9th grade  Occupational History   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  Tobacco Use   Smoking status: Former    Current packs/day: 0.00    Average packs/day: 0.7 packs/day for 41.5 years (31.2 ttl pk-yrs)    Types: Cigarettes    Start date: 08/08/1976    Quit  date: 02/21/2018    Years since quitting: 5.1   Smokeless tobacco: Never  Vaping Use   Vaping status: Never Used  Substance and Sexual Activity   Alcohol use: No    Alcohol/week: 0.0 standard drinks of alcohol   Drug use: No   Sexual activity: Not on file  Other Topics Concern   Not on file  Social History Narrative   Lives with wife   Social Determinants of Health   Financial Resource Strain: Not on file  Food Insecurity: Not on file  Transportation Needs: Not on file  Physical Activity: Not on file  Stress: Not on file  Social Connections: Unknown (02/21/2022)   Received from [employer]   Social Network    Social Network: Not on file    Review of Systems Per HPI  Objective:  BP 109/73   Pulse 70   Temp 97.6 F (36.4 C)   Ht 5\' 6"  (1.676 m)   Wt 195 lb (88.5 kg)   SpO2 96%   BMI 31.47 kg/m      04/27/2023    8:29 AM 01/16/2023    8:24 AM 10/26/2022    8:55 AM  BP/Weight  Systolic BP 109 110 106  Diastolic BP 73 72 68  Wt. (Lbs) 195 201 197.2  BMI 31.47 kg/m2 32.44 kg/m2 31.83 kg/m2    Physical Exam Vitals and nursing note reviewed.  Constitutional:  General: He is not in acute distress.    Appearance: Normal appearance.  HENT:     Head: Normocephalic and atraumatic.  Eyes:     General:        Right eye: No discharge.        Left eye: No discharge.     Conjunctiva/sclera: Conjunctivae normal.  Cardiovascular:     Rate and Rhythm: Normal rate and regular rhythm.  Pulmonary:     Effort: Pulmonary effort is normal.     Breath sounds: Normal breath sounds. No wheezing, rhonchi or rales.  Neurological:     Mental Status: He is alert.  Psychiatric:        Mood and Affect: Mood normal.        Behavior: Behavior normal.     Lab Results  Component Value Date   WBC 7.1 07/14/2022   HGB 16.7 07/14/2022   HCT 51.8 (H) 07/14/2022   PLT 241 07/14/2022   GLUCOSE 373 (H) 07/14/2022   CHOL 123 07/14/2022   TRIG 270 (H) 07/14/2022   HDL 45  07/14/2022   LDLCALC 37 07/14/2022   ALT 34 07/14/2022   AST 17 07/14/2022   NA 136 07/14/2022   K 5.0 07/14/2022   CL 95 (L) 07/14/2022   CREATININE 0.90 07/14/2022   BUN 15 07/14/2022   CO2 24 07/14/2022   TSH 0.90 10/13/2020   INR 0.90 12/19/2017   HGBA1C 6.2 (H) 01/16/2023   MICROALBUR 1.3 11/30/2020     Assessment & Plan:   Problem List Items Addressed This Visit       Cardiovascular and Mediastinum   CAD (coronary artery disease)    Stable.      Essential hypertension - Primary    Stable.  Continue current medications.        Endocrine   Hyperlipidemia associated with type 2 diabetes mellitus (HCC)    Has been at goal.  Repeating lipid panel today.  Continue Crestor and Zetia.      Relevant Orders   Lipid panel   Type 2 diabetes mellitus with complications (HCC)    At goal.  Continue Jardiance and metformin.      Relevant Orders   CMP14+EGFR   Hemoglobin A1c     Other   Chronic pain of both knees    Diclofenac as prescribed.  Stopping Celebrex.  Referral placed to orthopedics.      Relevant Medications   diclofenac (VOLTAREN) 75 MG EC tablet   Other Relevant Orders   Ambulatory referral to Orthopedic Surgery   Chronic pain of both shoulders    Diclofenac as prescribed.  Stopping Celebrex.  Referral placed to orthopedics.      Relevant Medications   diclofenac (VOLTAREN) 75 MG EC tablet   Other Relevant Orders   Ambulatory referral to Orthopedic Surgery   Other Visit Diagnoses     Screening for deficiency anemia       Relevant Orders   CBC       Meds ordered this encounter  Medications   diclofenac (VOLTAREN) 75 MG EC tablet    Sig: Take 1 tablet (75 mg total) by mouth 2 (two) times daily as needed for moderate pain.    Dispense:  60 tablet    Refill:  0    Follow-up:  Return in about 3 months (around 07/28/2023).  Everlene Other DO [facility]

## 2023-04-27 NOTE — Patient Instructions (Addendum)
Stop Celebrex.  Start diclofenac.  Referring to orthopedics.  Follow up in 3 months.

## 2023-04-27 NOTE — Assessment & Plan Note (Signed)
Stable

## 2023-04-28 LAB — CBC
Hematocrit: 49 % (ref 37.5–51.0)
Hemoglobin: 15.7 g/dL (ref 13.0–17.7)
MCH: 28.9 pg (ref 26.6–33.0)
MCHC: 32 g/dL (ref 31.5–35.7)
MCV: 90 fL (ref 79–97)
Platelets: 193 10*3/uL (ref 150–450)
RBC: 5.44 x10E6/uL (ref 4.14–5.80)
RDW: 12.5 % (ref 11.6–15.4)
WBC: 5.9 10*3/uL (ref 3.4–10.8)

## 2023-04-28 LAB — LIPID PANEL
Chol/HDL Ratio: 2.3 ratio (ref 0.0–5.0)
Cholesterol, Total: 105 mg/dL (ref 100–199)
HDL: 45 mg/dL (ref >39)
LDL Chol Calc (NIH): 38 mg/dL (ref 0–99)
Triglycerides: 126 mg/dL (ref 0–149)
VLDL Cholesterol Cal: 22 mg/dL (ref 5–40)

## 2023-04-28 LAB — CMP14+EGFR
ALT: 22 IU/L (ref 0–44)
AST: 16 IU/L (ref 0–40)
Albumin: 4.8 g/dL (ref 3.8–4.9)
Alkaline Phosphatase: 70 IU/L (ref 44–121)
BUN/Creatinine Ratio: 18 (ref 9–20)
BUN: 16 mg/dL (ref 6–24)
Bilirubin Total: 0.4 mg/dL (ref 0.0–1.2)
CO2: 25 mmol/L (ref 20–29)
Calcium: 9.4 mg/dL (ref 8.7–10.2)
Chloride: 101 mmol/L (ref 96–106)
Creatinine, Ser: 0.87 mg/dL (ref 0.76–1.27)
Globulin, Total: 2.2 g/dL (ref 1.5–4.5)
Glucose: 104 mg/dL — ABNORMAL HIGH (ref 70–99)
Potassium: 5 mmol/L (ref 3.5–5.2)
Sodium: 139 mmol/L (ref 134–144)
Total Protein: 7 g/dL (ref 6.0–8.5)
eGFR: 99 mL/min/{1.73_m2} (ref >59)

## 2023-04-28 LAB — HEMOGLOBIN A1C
Est. average glucose Bld gHb Est-mCnc: 128 mg/dL
Hgb A1c MFr Bld: 6.1 % — ABNORMAL HIGH (ref 4.8–5.6)

## 2023-05-09 ENCOUNTER — Other Ambulatory Visit: Payer: Self-pay | Admitting: Family Medicine

## 2023-06-06 ENCOUNTER — Other Ambulatory Visit: Payer: Self-pay | Admitting: Family Medicine

## 2023-06-23 ENCOUNTER — Encounter: Payer: Self-pay | Admitting: Nurse Practitioner

## 2023-06-23 ENCOUNTER — Ambulatory Visit (INDEPENDENT_AMBULATORY_CARE_PROVIDER_SITE_OTHER): Payer: Medicare HMO | Admitting: Nurse Practitioner

## 2023-06-23 VITALS — BP 114/70 | HR 97 | Temp 97.6°F | Ht 66.0 in | Wt 196.0 lb

## 2023-06-23 DIAGNOSIS — J208 Acute bronchitis due to other specified organisms: Secondary | ICD-10-CM

## 2023-06-23 DIAGNOSIS — B9689 Other specified bacterial agents as the cause of diseases classified elsewhere: Secondary | ICD-10-CM | POA: Diagnosis not present

## 2023-06-23 DIAGNOSIS — J439 Emphysema, unspecified: Secondary | ICD-10-CM

## 2023-06-23 MED ORDER — FLUTICASONE-SALMETEROL 100-50 MCG/ACT IN AEPB: 1.0000 | INHALATION_SPRAY | Freq: Two times a day (BID) | RESPIRATORY_TRACT | 2 refills | Status: DC

## 2023-06-23 MED ORDER — AZITHROMYCIN 250 MG PO TABS: ORAL_TABLET | ORAL | 0 refills | Status: DC

## 2023-06-23 MED ORDER — PROMETHAZINE-DM 6.25-15 MG/5ML PO SYRP: 5.0000 mL | ORAL_SOLUTION | Freq: Four times a day (QID) | ORAL | 0 refills | Status: DC | PRN

## 2023-06-23 NOTE — Progress Notes (Unsigned)
Subjective:    Patient ID: Anthony Hensley, male    DOB: [birth year], 62 y.o.   MRN: [id number]  HPI Sinus congestion , sweats, cough productive with rib pain - uses pillow for sore ribs from cough symptoms x 2 days Presents for complaints of frequent cough for the past 2 days.  Nonproductive.  Has cough to the point where his ribs are sore and he has to splint his sides with cough due to pain.  No wheezing.  Max temp 99.3.  No ear pain.  Sore throat.  No sinus headache or pain.  Taking fluids well.  Voiding normal limit.  Patient has type 2 diabetes.  Has not checked his blood sugars.  Denies any smoking or vaping.  Was a former smoker.  No relief with Advil cold and sinus.  PMH includes emphysema.  Review of Systems  Constitutional:  Positive for fatigue and fever.       Low-grade fever 99.3  HENT:  Positive for congestion, postnasal drip and sore throat. Negative for facial swelling, sinus pressure and sinus pain.   Respiratory:  Positive for cough and chest tightness. Negative for shortness of breath and wheezing.   Cardiovascular:  Negative for chest pain.        Objective:   Physical Exam NAD.  Alert, oriented.  Occasional nonproductive spasmodic cough noted.  TMs clear effusion, no erythema.  Nasal mucosa pale and slightly boggy.  Pharynx injected with PND noted.  Neck supple with mild soft anterior cervical adenopathy.  Lung sounds diminished in general with occasional expiratory crackle mainly anterior upper lobes.  No tachypnea.  Normal color.  Heart regular rate rhythm. Today's Vitals   06/23/23 1508  BP: 114/70  Pulse: 97  Temp: 97.6 F (36.4 C)  SpO2: 95%  Weight: 196 lb (88.9 kg)  Height: 5\' 6"  (1.676 m)   Body mass index is 31.64 kg/m.      Assessment & Plan:   Problem List Items Addressed This Visit       Respiratory   Acute bacterial bronchitis - Primary   Relevant Medications   azithromycin (ZITHROMAX Z-PAK) 250 MG tablet   Emphysema lung (HCC)    Relevant Medications   fluticasone-salmeterol (WIXELA INHUB) 100-50 MCG/ACT AEPB   azithromycin (ZITHROMAX Z-PAK) 250 MG tablet   promethazine-dextromethorphan (PROMETHAZINE-DM) 6.25-15 MG/5ML syrup   Meds ordered this encounter  Medications   fluticasone-salmeterol (WIXELA INHUB) 100-50 MCG/ACT AEPB    Sig: Inhale 1 puff into the lungs 2 (two) times daily. To prevent wheezing and cough    Dispense:  60 each    Refill:  2    Order Specific Question:   Supervising Provider    Answer:   Lilyan Punt A [[id number]]   azithromycin (ZITHROMAX Z-PAK) 250 MG tablet    Sig: Take 2 tablets (500 mg) on  Day 1,  followed by 1 tablet (250 mg) once daily on Days 2 through 5.    Dispense:  6 each    Refill:  0    Order Specific Question:   Supervising Provider    Answer:   Lilyan Punt A [[id number]]   promethazine-dextromethorphan (PROMETHAZINE-DM) 6.25-15 MG/5ML syrup    Sig: Take 5 mLs by mouth 4 (four) times daily as needed for cough.    Dispense:  118 mL    Refill:  0    Order Specific Question:   Supervising Provider    Answer:   Lilyan Punt A [[id number]]   Will avoid oral  steroids due to his diabetes.  Start Wixela inhaler as directed.  Use albuterol as directed as needed. Z-Pak as directed. Given samples of Mucinex DM for morning use with Promethazine DM for nighttime use. Warning signs reviewed.  Call back next week if no improvement, go to ED or urgent care over the weekend if worse.

## 2023-06-24 ENCOUNTER — Encounter: Payer: Self-pay | Admitting: Nurse Practitioner

## 2023-06-24 DIAGNOSIS — B9689 Other specified bacterial agents as the cause of diseases classified elsewhere: Secondary | ICD-10-CM | POA: Insufficient documentation

## 2023-06-29 ENCOUNTER — Telehealth: Payer: Self-pay

## 2023-06-29 ENCOUNTER — Other Ambulatory Visit: Payer: Self-pay

## 2023-06-29 MED ORDER — EMPAGLIFLOZIN 10 MG PO TABS: 10.0000 mg | ORAL_TABLET | Freq: Every day | ORAL | 3 refills | Status: DC

## 2023-06-29 MED ORDER — TAMSULOSIN HCL 0.4 MG PO CAPS: 0.4000 mg | ORAL_CAPSULE | Freq: Every day | ORAL | 0 refills | Status: DC

## 2023-06-29 NOTE — Telephone Encounter (Signed)
Prescription Request  06/29/2023  LOV: Visit date not found  What is the name of the medication or equipment? tamsulosin (FLOMAX) 0.4 MG CAPS capsule   Have you contacted your pharmacy to request a refill? Yes   Which pharmacy would you like this sent to?  [facility] - [place], [place] - [address] [place] [address] Phone: [phone] Fax: [phone]    Patient notified that their request is being sent to the clinical staff for review and that they should receive a response within 2 business days.   Please advise at Mobile There is no such number on file (mobile).

## 2023-06-29 NOTE — Telephone Encounter (Signed)
Prescription Request  06/29/2023  LOV: Visit date not found  What is the name of the medication or equipment? empagliflozin (JARDIANCE) 10 MG TABS tablet   Have you contacted your pharmacy to request a refill? Yes   Which pharmacy would you like this sent to?  [facility] - [place], [place] - [address] [place] [address] Phone: [phone] Fax: [phone]    Patient notified that their request is being sent to the clinical staff for review and that they should receive a response within 2 business days.   Please advise at Mobile There is no such number on file (mobile).

## 2023-07-20 ENCOUNTER — Other Ambulatory Visit: Payer: Self-pay | Admitting: Physician Assistant

## 2023-07-27 ENCOUNTER — Ambulatory Visit: Payer: Medicare HMO | Admitting: Family Medicine

## 2023-07-27 VITALS — BP 122/77 | HR 73 | Ht 66.0 in | Wt 201.2 lb

## 2023-07-27 DIAGNOSIS — E118 Type 2 diabetes mellitus with unspecified complications: Secondary | ICD-10-CM | POA: Diagnosis not present

## 2023-07-27 DIAGNOSIS — I10 Essential (primary) hypertension: Secondary | ICD-10-CM

## 2023-07-27 DIAGNOSIS — E785 Hyperlipidemia, unspecified: Secondary | ICD-10-CM

## 2023-07-27 DIAGNOSIS — E1169 Type 2 diabetes mellitus with other specified complication: Secondary | ICD-10-CM

## 2023-07-27 NOTE — Assessment & Plan Note (Signed)
Stable.  Continue lisinopril and metoprolol.

## 2023-07-27 NOTE — Assessment & Plan Note (Addendum)
A1c has been at goal.  Stopping Jardiance due to cost. Will repeat A1c in 3 months.

## 2023-07-27 NOTE — Assessment & Plan Note (Signed)
At goal on Zetia and Crestor.

## 2023-07-27 NOTE — Patient Instructions (Signed)
Stop Jardiance due to cost.  Check blood sugars 1-2 times a week.  Follow up in 3 months.

## 2023-07-27 NOTE — Progress Notes (Signed)
Subjective:  Patient ID: Anthony Hensley, male    DOB: 06-19-1961  Age: 62 y.o. MRN: [id number]  CC:  Follow up  HPI:  62 year old male presents for follow-up.  Patient states that overall he is doing well.  However, he reports that his [place] Pepper is too expensive.  It is over $150.  He would like to discuss this today.  Hypertension stable.  A1c has been at goal.  Previous proteinuria associated with type 2 diabetes improved after addition of Jardiance as well.  Unfortunately, [place] Pepper is too expensive for him.  Patient Active Problem List   Diagnosis Date Noted   Chronic pain of both knees 04/27/2023   Chronic pain of both shoulders 04/27/2023   Microalbuminuria 08/01/2022   Former smoker 07/14/2022   Emphysema lung (HCC) 07/14/2022   Benign prostatic hyperplasia with urinary frequency 07/14/2022   Type 2 diabetes mellitus with complications (HCC) 01/12/2022   Low back pain 12/23/2021   Essential hypertension    Hyperlipidemia associated with type 2 diabetes mellitus (HCC) 11/30/2020   CAD (coronary artery disease) 03/20/2017   GERD (gastroesophageal reflux disease) 06/08/2016    Social Hx   Social History   Socioeconomic History   Marital status: Married    Spouse name: Junious Dresser   Number of children: 1   Years of education: Not on file   Highest education level: 9th grade  Occupational History   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  Tobacco Use   Smoking status: Former    Current packs/day: 0.00    Average packs/day: 0.7 packs/day for 41.5 years (31.2 ttl pk-yrs)    Types: Cigarettes    Start date: 08/08/1976    Quit date: 02/21/2018    Years since quitting: 5.4   Smokeless tobacco: Never  Vaping Use   Vaping status: Never Used  Substance and Sexual Activity   Alcohol use: No    Alcohol/week: 0.0 standard drinks of alcohol   Drug use: No   Sexual activity: Not on file  Other  Topics Concern   Not on file  Social History Narrative   Lives with wife   Social Determinants of Health   Financial Resource Strain: Not on file  Food Insecurity: Not on file  Transportation Needs: Not on file  Physical Activity: Not on file  Stress: Not on file  Social Connections: Unknown (02/21/2022)   Received from [facility]   Social Network    Social Network: Not on file    Review of Systems Per HPI  Objective:  BP 122/77   Pulse 73   Ht 5\' 6"  (1.676 m)   Wt 201 lb 3.2 oz (91.3 kg)   SpO2 94%   BMI 32.47 kg/m      07/27/2023    9:31 AM 06/23/2023    3:08 PM 04/27/2023    8:29 AM  BP/Weight  Systolic BP 122 114 109  Diastolic BP 77 70 73  Wt. (Lbs) 201.2 196 195  BMI 32.47 kg/m2 31.64 kg/m2 31.47 kg/m2    Physical Exam Vitals and nursing note reviewed.  Constitutional:      General: He is not in acute distress.    Appearance: Normal appearance.  HENT:     Head: Normocephalic and atraumatic.  Cardiovascular:     Rate and Rhythm: Normal rate and regular rhythm.  Pulmonary:     Effort: Pulmonary effort is  normal.     Breath sounds: Normal breath sounds.  Neurological:     Mental Status: He is alert.  Psychiatric:        Mood and Affect: Mood normal.        Behavior: Behavior normal.     Lab Results  Component Value Date   WBC 5.9 04/27/2023   HGB 15.7 04/27/2023   HCT 49.0 04/27/2023   PLT 193 04/27/2023   GLUCOSE 104 (H) 04/27/2023   CHOL 105 04/27/2023   TRIG 126 04/27/2023   HDL 45 04/27/2023   LDLCALC 38 04/27/2023   ALT 22 04/27/2023   AST 16 04/27/2023   NA 139 04/27/2023   K 5.0 04/27/2023   CL 101 04/27/2023   CREATININE 0.87 04/27/2023   BUN 16 04/27/2023   CO2 25 04/27/2023   TSH 0.90 10/13/2020   INR 0.90 12/19/2017   HGBA1C 6.1 (H) 04/27/2023   MICROALBUR 1.3 11/30/2020     Assessment & Plan:   Problem List Items Addressed This Visit       Cardiovascular and Mediastinum   Essential hypertension  - Primary    Stable.  Continue lisinopril and metoprolol.        Endocrine   Hyperlipidemia associated with type 2 diabetes mellitus (HCC)    At goal on Zetia and Crestor.      Type 2 diabetes mellitus with complications (HCC)    A1c has been at goal.  Stopping Jardiance due to cost. Will repeat A1c in 3 months.       Follow-up:  Return in about 3 months (around 10/27/2023) for Diabetes follow up.  Everlene Other DO [facility]

## 2023-07-28 ENCOUNTER — Ambulatory Visit: Payer: Medicare HMO | Admitting: Family Medicine

## 2023-09-19 ENCOUNTER — Other Ambulatory Visit: Payer: Self-pay | Admitting: Family Medicine

## 2023-09-25 ENCOUNTER — Ambulatory Visit: Payer: Medicare HMO | Admitting: Internal Medicine

## 2023-10-09 ENCOUNTER — Other Ambulatory Visit: Payer: Self-pay | Admitting: Physician Assistant

## 2023-10-17 ENCOUNTER — Ambulatory Visit: Payer: Medicare HMO | Attending: Internal Medicine | Admitting: Internal Medicine

## 2023-10-17 ENCOUNTER — Encounter: Payer: Self-pay | Admitting: Internal Medicine

## 2023-10-17 VITALS — BP 120/74 | HR 80 | Wt 209.8 lb

## 2023-10-17 DIAGNOSIS — E66811 Obesity, class 1: Secondary | ICD-10-CM

## 2023-10-17 DIAGNOSIS — E785 Hyperlipidemia, unspecified: Secondary | ICD-10-CM | POA: Diagnosis not present

## 2023-10-17 DIAGNOSIS — Z6833 Body mass index (BMI) 33.0-33.9, adult: Secondary | ICD-10-CM

## 2023-10-17 DIAGNOSIS — I2510 Atherosclerotic heart disease of native coronary artery without angina pectoris: Secondary | ICD-10-CM | POA: Diagnosis not present

## 2023-10-17 DIAGNOSIS — I10 Essential (primary) hypertension: Secondary | ICD-10-CM | POA: Diagnosis not present
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
## 2023-10-17 NOTE — Patient Instructions (Signed)
 Medication Instructions:  NO CHANGES  *If you need a refill on your cardiac medications before your next appointment, please call your pharmacy*  Follow-Up: At [facility], you and your health needs are our priority.  As part of our continuing mission to provide you with exceptional heart care, we have created designated Provider Care Teams.  These Care Teams include your primary Cardiologist (physician) and Advanced Practice Providers (APPs -  Physician Assistants and Nurse Practitioners) who all work together to provide you with the care you need, when you need it.  We recommend signing up for the patient portal called MyChart.  Sign up information is provided on this After Visit Summary.  MyChart is used to connect with patients for Virtual Visits (Telemedicine).  Patients are able to view lab/test results, encounter notes, upcoming appointments, etc.  Non-urgent messages can be sent to your provider as well.   To learn more about what you can do with MyChart, go to [email or web address].    Your next appointment:   12 months with Dr. Mona  Other Instructions You have been referred to our clinical pharmacy team to discuss weight loss medication(s) - Wegovy 

## 2023-10-19 ENCOUNTER — Ambulatory Visit: Payer: Medicare HMO | Attending: Cardiology | Admitting: Pharmacist Clinician (PhC)/ Clinical Pharmacy Specialist

## 2023-10-19 ENCOUNTER — Encounter: Payer: Self-pay | Admitting: Pharmacist Clinician (PhC)/ Clinical Pharmacy Specialist

## 2023-10-19 ENCOUNTER — Other Ambulatory Visit: Payer: Self-pay | Admitting: Physician Assistant

## 2023-10-19 ENCOUNTER — Other Ambulatory Visit: Payer: Self-pay | Admitting: Family Medicine

## 2023-10-19 VITALS — Ht 66.0 in | Wt 209.6 lb

## 2023-10-19 DIAGNOSIS — E118 Type 2 diabetes mellitus with unspecified complications: Secondary | ICD-10-CM

## 2023-10-19 MED ORDER — SEMAGLUTIDE(0.25 OR 0.5MG/DOS) 2 MG/3ML ~~LOC~~ SOPN: 3.0000 mL | PEN_INJECTOR | SUBCUTANEOUS | Status: DC

## 2023-10-19 NOTE — Patient Instructions (Signed)
 We will start the prior authorization process to get Ozempic  covered by your insurance.   TIPS FOR SUCCESS Write down the reasons why you want to lose weight and post it in a place where you'll see it often. Start small and work your way up. Keep in mind that it takes time to achieve goals, and small steps add up. Any additional movements help to burn calories. Taking the stairs rather than the elevator and parking at the far end of your parking lot are easy ways to start. Brisk walking for at least 30 minutes 4 or more days of the week is an excellent goal to work toward  [employer] [employer] WHAT IT MEANS TO FEEL FULL Did you know that it can take 15 minutes or more for your brain to receive the message that you've eaten? That means that, if you eat less food, but consume it slower, you may still feel satisfied. Eating a lot of fruits and vegetables can also help you feel fuller. Eat off of smaller plates so that moderate portions don't seem too small  TITRATION PLAN Will plan to follow the titration plan as below, pending patient is tolerating each dose before increasing to the next. Can slow titration if needed for tolerability.    -Weeks 1-4: Inject 0.25 mg SQ once weekly  -Weeks 5-8: Inject 0.5 mg SQ once weekly  -Weeks 9-12 Inject 1 mg SQ once weekly  -Weeks 13-16: Inject 1.7 mg SQ once weekly   Follow up in 3 months.  If you have any questions or concerns, please reach out to us .  Genesys Coggeshall/Chris at [phone].  THANK YOU FOR CHOOSING [facility]

## 2023-10-19 NOTE — Assessment & Plan Note (Signed)
  Patient with DM2 and obesity, as well as history of MI, currently not at goal weight.   Pharmacotherapy is appropriate to pursue as augmentation. Will start Ozempic .   Confirmed patient with no personal or family history of medullary thyroid  carcinoma (MTC) or Multiple Endocrine Neoplasia syndrome type 2 (MEN 2).   Advised patient on common side effects including nausea, diarrhea, dyspepsia, decreased appetite, and fatigue. Counseled patient on reducing meal size and how to titrate medication to minimize side effects. Patient aware to call if intolerable side effects or if experiencing dehydration, abdominal pain, or dizziness. Patient will adhere to dietary modifications and will target at least 150 minutes of moderate intensity exercise weekly.   Injection technique reviewed at today's visit.    Titration Plan:  Will plan to follow the titration plan as below, pending patient is tolerating each dose before increasing to the next. Can slow titration if needed for tolerability.    -Month 1: Inject 0.25 mg  SQ once weekly x 4 weeks -Month 2: Inject 0.5 mg  SQ once weekly x 6 weeks -Month 3: Inject 1 mg SQ once weekly x 4 weeks -Month 4+: Inject 1.7 mg SQ once weekly   Follow up in 3 months.

## 2023-10-19 NOTE — Progress Notes (Signed)
 Office Visit    Patient Name: Anthony Hensley Date of Encounter: 10/19/2023  Primary Care Provider:  Cook, Jayce G, DO Primary Cardiologist:  Vinie JAYSON Maxcy, MD  Chief Complaint    Weight management  Significant Past Medical History   CAD 2016 NSTEMI, DEs to mCx  HTN Controlled at last visit, on lisinopril  10 mg, metoprolol  succ 25  HLD 7/24 LDL 38, on rosuvastatin  40, ezetimibe  10  DM2 7/24 A1c 6.1, as high as 11.3 in 10/23, on metformin  1 gm bid       No Known Allergies  History of Present Illness    Anthony Hensley is a 63 y.o. male patient of Dr Maxcy, in the office today to discuss options for weight management.   He is diabetic, and A1c was as high as 13 within the past 2 years  Current weight management medications: none  Previously tried meds: none  Current meds that may affect weight: none  Baseline weight/BMI:  95.1 kg // 33.85  Insurance payor:  [employer] [id number]  Ozempic  preferred $250 deductilbe, then 25% = 439 for first month, then 252/month  Diet: mostly home cooked meals, mostly chicken and hamburger, vegetables usually canned or frozen; doesn't snack much   Exercise: no regular exercise  Family History:  father died at 61 from MI, mother died emphysema, cancer; brother had stenting about 2 years ago; one son with lung disease, died from Covid, other son healthy  Confirmed patient has no personal or family history of medullary thyroid  carcinoma (MTC) or Multiple Endocrine Neoplasia syndrome type 2 (MEN 2).   Social History:   Tobacco: quit smoking several years ago  Alcohol : no  Caffeine: 2 large cups of coffee in the mornings, another cup at night   Accessory Clinical Findings    Lab Results  Component Value Date   CREATININE 0.87 04/27/2023   BUN 16 04/27/2023   NA 139 04/27/2023   K 5.0 04/27/2023   CL 101 04/27/2023   CO2 25 04/27/2023   Lab Results  Component Value Date   ALT 22 04/27/2023   AST 16 04/27/2023   ALKPHOS 70  04/27/2023   BILITOT 0.4 04/27/2023   Lab Results  Component Value Date   HGBA1C 6.1 (H) 04/27/2023      Home Medications/Allergies    Current Outpatient Medications  Medication Sig Dispense Refill   metFORMIN  (GLUCOPHAGE ) 500 MG tablet TAKE 2 TABLETS BY MOUTH 2 TIMES DAILY 180 tablet 2   Semaglutide ,0.25 or 0.5MG /DOS, 2 MG/3ML SOPN Inject 3 mLs into the skin every 7 (seven) days.     tamsulosin  (FLOMAX ) 0.4 MG CAPS capsule TAKE 1 CAPSULE BY MOUTH ONCE DAILY 90 capsule 0   aspirin  81 MG EC tablet Take 1 tablet (81 mg total) by mouth daily. 30 tablet    ezetimibe  (ZETIA ) 10 MG tablet Take 1 tablet (10 mg total) by mouth daily. Please keep scheduled appointment for future refills. Thank you. 30 tablet 0   fluticasone -salmeterol (WIXELA INHUB) 100-50 MCG/ACT AEPB Inhale 1 puff into the lungs 2 (two) times daily. To prevent wheezing and cough 60 each 2   glucose blood (ONETOUCH VERIO) test strip USE TO check blood sugar UP TO 4 TIMES DAILY 100 strip 1   Lancets (ONETOUCH DELICA PLUS LANCET30G) MISC USE TO CHECK BLOOD SUGAR UP TO wid 100 each 1   lisinopril  (ZESTRIL ) 10 MG tablet Take 1 tablet (10 mg total) by mouth daily. 100 tablet 3  metoprolol  succinate (TOPROL -XL) 25 MG 24 hr tablet TAKE 1 TABLET BY MOUTH EVERY DAY 90 tablet 0   nitroGLYCERIN  (NITROSTAT ) 0.4 MG SL tablet Place 1 tablet (0.4 mg total) under the tongue every 5 (five) minutes as needed for chest pain. (Patient not taking: Reported on 10/17/2023) 25 tablet 3   pantoprazole  (PROTONIX ) 40 MG tablet TAKE 1 TABLET BY MOUTH EVERY DAY 90 tablet 1   rosuvastatin  (CRESTOR ) 40 MG tablet TAKE 1 TABLET BY MOUTH EVERY DAY AT 6 PM 100 tablet 3   No current facility-administered medications for this visit.     No Known Allergies  Assessment & Plan    Type 2 diabetes mellitus with complications (HCC)  Patient with DM2 and obesity, as well as history of MI, currently not at goal weight.   Pharmacotherapy is appropriate to pursue as  augmentation. Will start Ozempic .   Confirmed patient with no personal or family history of medullary thyroid  carcinoma (MTC) or Multiple Endocrine Neoplasia syndrome type 2 (MEN 2).   Advised patient on common side effects including nausea, diarrhea, dyspepsia, decreased appetite, and fatigue. Counseled patient on reducing meal size and how to titrate medication to minimize side effects. Patient aware to call if intolerable side effects or if experiencing dehydration, abdominal pain, or dizziness. Patient will adhere to dietary modifications and will target at least 150 minutes of moderate intensity exercise weekly.   Injection technique reviewed at today's visit.    Titration Plan:  Will plan to follow the titration plan as below, pending patient is tolerating each dose before increasing to the next. Can slow titration if needed for tolerability.    -Month 1: Inject 0.25 mg  SQ once weekly x 4 weeks -Month 2: Inject 0.5 mg  SQ once weekly x 6 weeks -Month 3: Inject 1 mg SQ once weekly x 4 weeks -Month 4+: Inject 1.7 mg SQ once weekly   Follow up in 3 months.    Deysi Soldo PharmD CPP [facility]  [address] [place], [place] [address] [phone]

## 2023-10-27 ENCOUNTER — Ambulatory Visit (INDEPENDENT_AMBULATORY_CARE_PROVIDER_SITE_OTHER): Payer: Medicare HMO | Admitting: Family Medicine

## 2023-10-27 ENCOUNTER — Encounter: Payer: Self-pay | Admitting: Family Medicine

## 2023-10-27 VITALS — BP 124/70 | HR 74 | Temp 97.9°F | Ht 66.0 in | Wt 209.0 lb

## 2023-10-27 DIAGNOSIS — I10 Essential (primary) hypertension: Secondary | ICD-10-CM | POA: Diagnosis not present

## 2023-10-27 DIAGNOSIS — Z23 Encounter for immunization: Secondary | ICD-10-CM | POA: Diagnosis not present

## 2023-10-27 DIAGNOSIS — E785 Hyperlipidemia, unspecified: Secondary | ICD-10-CM

## 2023-10-27 DIAGNOSIS — Z130 Encounter for screening for diseases of the blood and blood-forming organs and certain disorders involving the immune mechanism: Secondary | ICD-10-CM

## 2023-10-27 DIAGNOSIS — Z87891 Personal history of nicotine dependence: Secondary | ICD-10-CM

## 2023-10-27 DIAGNOSIS — Z125 Encounter for screening for malignant neoplasm of prostate: Secondary | ICD-10-CM

## 2023-10-27 DIAGNOSIS — E118 Type 2 diabetes mellitus with unspecified complications: Secondary | ICD-10-CM

## 2023-10-27 DIAGNOSIS — E1169 Type 2 diabetes mellitus with other specified complication: Secondary | ICD-10-CM | POA: Diagnosis not present

## 2023-10-27 DIAGNOSIS — M25512 Pain in left shoulder: Secondary | ICD-10-CM

## 2023-10-27 DIAGNOSIS — G8929 Other chronic pain: Secondary | ICD-10-CM

## 2023-10-27 DIAGNOSIS — M25511 Pain in right shoulder: Secondary | ICD-10-CM | POA: Diagnosis not present

## 2023-10-27 MED ORDER — IBUPROFEN 800 MG PO TABS: 800.0000 mg | ORAL_TABLET | Freq: Three times a day (TID) | ORAL | 1 refills | Status: DC | PRN

## 2023-10-27 NOTE — Patient Instructions (Signed)
Medication as prescribed.  Labs today.  Follow up in 6 months.  

## 2023-10-29 ENCOUNTER — Encounter: Payer: Self-pay | Admitting: Family Medicine

## 2023-10-29 LAB — CMP14+EGFR
ALT: 32 [IU]/L (ref 0–44)
AST: 22 [IU]/L (ref 0–40)
Albumin: 4.7 g/dL (ref 3.8–4.9)
Alkaline Phosphatase: 73 [IU]/L (ref 44–121)
BUN/Creatinine Ratio: 24 (ref 10–24)
BUN: 22 mg/dL (ref 8–27)
Bilirubin Total: 0.3 mg/dL (ref 0.0–1.2)
CO2: 24 mmol/L (ref 20–29)
Calcium: 9.4 mg/dL (ref 8.6–10.2)
Chloride: 98 mmol/L (ref 96–106)
Creatinine, Ser: 0.9 mg/dL (ref 0.76–1.27)
Globulin, Total: 2.2 g/dL (ref 1.5–4.5)
Glucose: 142 mg/dL — ABNORMAL HIGH (ref 70–99)
Potassium: 4.5 mmol/L (ref 3.5–5.2)
Sodium: 138 mmol/L (ref 134–144)
Total Protein: 6.9 g/dL (ref 6.0–8.5)
eGFR: 98 mL/min/{1.73_m2} (ref >59)

## 2023-10-29 LAB — HEMOGLOBIN A1C
Est. average glucose Bld gHb Est-mCnc: 143 mg/dL
Hgb A1c MFr Bld: 6.6 % — ABNORMAL HIGH (ref 4.8–5.6)

## 2023-10-29 LAB — MICROALBUMIN / CREATININE URINE RATIO
Creatinine, Urine: 135.6 mg/dL
Microalb/Creat Ratio: 22 mg/g{creat} (ref 0–29)
Microalbumin, Urine: 30.2 ug/mL

## 2023-10-29 LAB — CBC
Hematocrit: 46.6 % (ref 37.5–51.0)
Hemoglobin: 15.3 g/dL (ref 13.0–17.7)
MCH: 29.7 pg (ref 26.6–33.0)
MCHC: 32.8 g/dL (ref 31.5–35.7)
MCV: 90 fL (ref 79–97)
Platelets: 229 10*3/uL (ref 150–450)
RBC: 5.16 x10E6/uL (ref 4.14–5.80)
RDW: 12.1 % (ref 11.6–15.4)
WBC: 5.7 10*3/uL (ref 3.4–10.8)

## 2023-10-29 LAB — LIPID PANEL
Chol/HDL Ratio: 2.4 {ratio} (ref 0.0–5.0)
Cholesterol, Total: 94 mg/dL — ABNORMAL LOW (ref 100–199)
HDL: 39 mg/dL — ABNORMAL LOW (ref >39)
LDL Chol Calc (NIH): 27 mg/dL (ref 0–99)
Triglycerides: 172 mg/dL — ABNORMAL HIGH (ref 0–149)
VLDL Cholesterol Cal: 28 mg/dL (ref 5–40)

## 2023-10-29 LAB — PSA: Prostate Specific Ag, Serum: 1.8 ng/mL (ref 0.0–4.0)

## 2023-10-29 NOTE — Assessment & Plan Note (Signed)
BP stable. Continue current meds.   

## 2023-10-29 NOTE — Assessment & Plan Note (Signed)
LDL returned at 27. I feel that its okay to stop Zetia. Discontinuing.

## 2023-10-29 NOTE — Assessment & Plan Note (Signed)
Stable.  Continue current medications.

## 2023-10-29 NOTE — Progress Notes (Signed)
Subjective:  Patient ID: Anthony Hensley, male    DOB: 17-Feb-1961  Age: 63 y.o. MRN: [id number]  CC:   Chief Complaint  Patient presents with   Follow-up    Diabetes f/u     HPI:  63 year old male presents for follow up.  Patient reports that he is having ongoing joint pains/aches. Ibuprofen helps and he would like an Rx for this.  Patient in need of pneumococcal vaccine. Amendable to this today. Advised to get Tdap at pharmacy (medicare will not pay for this in the clinic).  Needs labs today. A1c has been stable. Cardiology recently started Ozempic. Remains on Metformin. [place] was too costly.  BP @ goal. Labs today to assess lipids.  Patient Active Problem List   Diagnosis Date Noted   Chronic pain of both knees 04/27/2023   Chronic pain of both shoulders 04/27/2023   Microalbuminuria 08/01/2022   Former smoker 07/14/2022   Emphysema lung (HCC) 07/14/2022   Benign prostatic hyperplasia with urinary frequency 07/14/2022   Type 2 diabetes mellitus with complications (HCC) 01/12/2022   Low back pain 12/23/2021   Essential hypertension    Hyperlipidemia associated with type 2 diabetes mellitus (HCC) 11/30/2020   CAD (coronary artery disease) 03/20/2017   GERD (gastroesophageal reflux disease) 06/08/2016    Social Hx   Social History   Socioeconomic History   Marital status: Married    Spouse name: Junious Dresser   Number of children: 1   Years of education: Not on file   Highest education level: 9th grade  Occupational History   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  Tobacco Use   Smoking status: Former    Current packs/day: 0.00    Average packs/day: 0.7 packs/day for 41.5 years (31.2 ttl pk-yrs)    Types: Cigarettes    Start date: 08/08/1976    Quit date: 02/21/2018    Years since quitting: 5.6   Smokeless tobacco: Never  Vaping Use   Vaping status: Never Used  Substance and Sexual  Activity   Alcohol use: No    Alcohol/week: 0.0 standard drinks of alcohol   Drug use: No   Sexual activity: Not on file  Other Topics Concern   Not on file  Social History Narrative   Lives with wife   Social Drivers of Corporate investment banker Strain: Not on file  Food Insecurity: Not on file  Transportation Needs: Not on file  Physical Activity: Not on file  Stress: Not on file  Social Connections: Unknown (02/21/2022)   [facility]   Social Network    Social Network: Not on file    Review of Systems Per HPI  Objective:  BP 124/70   Pulse 74   Temp 97.9 F (36.6 C)   Ht 5\' 6"  (1.676 m)   Wt 209 lb (94.8 kg)   SpO2 97%   BMI 33.73 kg/m      10/27/2023    9:16 AM 10/19/2023    8:30 AM 10/17/2023    4:02 PM  BP/Weight  Systolic BP 124  546  Diastolic BP 70  74  Wt. (Lbs) 209 209.6 209.8  BMI 33.73 kg/m2 33.83 kg/m2 33.86 kg/m2    Physical Exam Vitals and nursing note reviewed.  Constitutional:      General: He is not in acute distress.    Appearance: Normal appearance.  HENT:     Head: Normocephalic and atraumatic.  Eyes:     General:        Right eye: No discharge.        Left eye: No discharge.     Conjunctiva/sclera: Conjunctivae normal.  Cardiovascular:     Rate and Rhythm: Normal rate and regular rhythm.  Pulmonary:     Effort: Pulmonary effort is normal.     Breath sounds: Normal breath sounds. No wheezing, rhonchi or rales.  Neurological:     Mental Status: He is alert.  Psychiatric:        Mood and Affect: Mood normal.        Behavior: Behavior normal.     Lab Results  Component Value Date   WBC 5.7 10/27/2023   HGB 15.3 10/27/2023   HCT 46.6 10/27/2023   PLT 229 10/27/2023   GLUCOSE 142 (H) 10/27/2023   CHOL 94 (L) 10/27/2023   TRIG 172 (H) 10/27/2023   HDL 39 (L) 10/27/2023   LDLCALC 27 10/27/2023   ALT 32 10/27/2023   AST 22 10/27/2023   NA 138 10/27/2023   K 4.5 10/27/2023   CL 98  10/27/2023   CREATININE 0.90 10/27/2023   BUN 22 10/27/2023   CO2 24 10/27/2023   TSH 0.90 10/13/2020   INR 0.90 12/19/2017   HGBA1C 6.6 (H) 10/27/2023   MICROALBUR 1.3 11/30/2020     Assessment & Plan:   Problem List Items Addressed This Visit       Cardiovascular and Mediastinum   Essential hypertension   BP stable. Continue current meds.        Endocrine   Type 2 diabetes mellitus with complications (HCC)   Stable. Continue current medications.      Relevant Orders   CMP14+EGFR (Completed)   Hemoglobin A1c (Completed)   Microalbumin / creatinine urine ratio (Completed)   Hyperlipidemia associated with type 2 diabetes mellitus (HCC)   LDL returned at 27. I feel that its okay to stop Zetia. Discontinuing.      Relevant Orders   Lipid panel (Completed)     Other   Former smoker   Relevant Orders   Ambulatory Referral Lung Cancer Screening [facility]   Chronic pain of both shoulders   Ibuprofen as needed.       Relevant Medications   ibuprofen (ADVIL) 800 MG tablet   Other Visit Diagnoses       Screening for deficiency anemia       Relevant Orders   CBC (Completed)     Screening PSA (prostate specific antigen)       Relevant Orders   PSA (Completed)     Immunization due       Relevant Orders   Pneumococcal conjugate vaccine 20-valent (Prevnar 20) (Completed)       Meds ordered this encounter  Medications   ibuprofen (ADVIL) 800 MG tablet    Sig: Take 1 tablet (800 mg total) by mouth every 8 (eight) hours as needed for moderate pain (pain score 4-6).    Dispense:  90 tablet    Refill:  1    Follow-up:  Return in about 6 months (around 04/25/2024).  Everlene Other DO [facility]

## 2023-10-29 NOTE — Assessment & Plan Note (Signed)
Ibuprofen as needed.

## 2023-10-30 ENCOUNTER — Telehealth: Payer: Self-pay

## 2023-10-30 NOTE — Telephone Encounter (Signed)
Left message for patient to return the call for additional details and recommendations regarding his lab results

## 2023-11-06 ENCOUNTER — Other Ambulatory Visit: Payer: Self-pay | Admitting: Internal Medicine

## 2023-11-28 ENCOUNTER — Telehealth: Payer: Self-pay | Admitting: Internal Medicine

## 2023-11-28 DIAGNOSIS — E118 Type 2 diabetes mellitus with unspecified complications: Secondary | ICD-10-CM

## 2023-11-28 NOTE — Telephone Encounter (Signed)
*  STAT* If patient is at the pharmacy, call can be transferred to refill team.   1. Which medications need to be refilled? (please list name of each medication and dose if known)   Semaglutide,0.25 or 0.5MG /DOS, 2 MG/3ML SOPN   2. Would you like to learn more about the convenience, safety, & potential cost savings by using the [facility]?   3. Are you open to using the [facility] (Type [facility]. ).  4. Which pharmacy/location (including street and city if local pharmacy) is medication to be sent to?  [facility] - [place], [place] - [address]   5. Do they need a 30 day or 90 day supply?  30 day  Patient stated he is completely out of this medication.

## 2023-11-29 ENCOUNTER — Other Ambulatory Visit (HOSPITAL_COMMUNITY): Payer: Self-pay

## 2023-11-29 MED ORDER — SEMAGLUTIDE(0.25 OR 0.5MG/DOS) 2 MG/3ML ~~LOC~~ SOPN
0.5000 mg | PEN_INJECTOR | SUBCUTANEOUS | 0 refills | Status: DC
  Filled 2023-11-29: qty 3, 28d supply, fill #0

## 2023-12-04 ENCOUNTER — Telehealth: Payer: Self-pay | Admitting: Internal Medicine

## 2023-12-04 ENCOUNTER — Telehealth: Payer: Self-pay

## 2023-12-04 ENCOUNTER — Other Ambulatory Visit: Payer: Self-pay | Admitting: Family Medicine

## 2023-12-04 DIAGNOSIS — E118 Type 2 diabetes mellitus with unspecified complications: Secondary | ICD-10-CM

## 2023-12-04 NOTE — Telephone Encounter (Signed)
*  STAT* If patient is at the pharmacy, call can be transferred to refill team.   1. Which medications need to be refilled? (please list name of each medication and dose if known) Semaglutide,0.25 or 0.5MG /DOS, 2 MG/3ML SOPN   2. Which pharmacy/location (including street and city if local pharmacy) is medication to be sent to?  [facility] - [place], [place] - [address]       3. Do they need a 30 day or 90 day supply? 90

## 2023-12-04 NOTE — Telephone Encounter (Signed)
 Last Fill: Unknown  Last OV: 10/27/23 Next OV: 04/26/24  Routing to provider for review/authorization.

## 2023-12-04 NOTE — Telephone Encounter (Signed)
 Copied from CRM [id number]. Topic: Clinical - Medication Refill >> Dec 04, 2023 10:46 AM Gildardo Pounds wrote: Most Recent Primary Care Visit:  Provider: Tommie Sams  Department: [facility]  Visit Type: OFFICE VISIT  Date: 10/27/2023  Medication: Semaglutide,0.25 or 0.5MG /DOS, 2 MG/3ML SOPN  Has the patient contacted their pharmacy? Yes (Agent: If no, request that the patient contact the pharmacy for the refill. If patient does not wish to contact the pharmacy document the reason why and proceed with request.) (Agent: If yes, when and what did the pharmacy advise?)Pharmacy said they have not received a refill  Is this the correct pharmacy for this prescription? Yes If no, delete pharmacy and type the correct one.  This is the patient's preferred pharmacy:  [facility] - [place], [place] - [address] [place] [address] Phone: [phone] Fax: [phone]   Has the prescription been filled recently? No  Is the patient out of the medication? Yes  Has the patient been seen for an appointment in the last year OR does the patient have an upcoming appointment? Yes  Can we respond through MyChart? No  Agent: Please be advised that Rx refills may take up to 3 business days. We ask that you follow-up with your pharmacy.

## 2023-12-04 NOTE — Telephone Encounter (Signed)
 Call to address concern. N/A LVM

## 2023-12-04 NOTE — Telephone Encounter (Signed)
 Received voicemail from pt stating he had a question about his "shot". Pt was recently seen by Phillips Hay, PharmD and prescribed Semaglutide. Will forward to PharmD team to return pt's call.

## 2023-12-05 ENCOUNTER — Other Ambulatory Visit (HOSPITAL_COMMUNITY): Payer: Self-pay

## 2023-12-05 ENCOUNTER — Telehealth: Payer: Self-pay | Admitting: Internal Medicine

## 2023-12-05 MED ORDER — SEMAGLUTIDE(0.25 OR 0.5MG/DOS) 2 MG/3ML ~~LOC~~ SOPN: 0.5000 mg | PEN_INJECTOR | SUBCUTANEOUS | 0 refills | Status: DC

## 2023-12-05 NOTE — Telephone Encounter (Signed)
*  STAT* If patient is at the pharmacy, call can be transferred to refill team.   1. Which medications need to be refilled? (please list name of each medication and dose if known)   Semaglutide,0.25 or 0.5MG /DOS, 2 MG/3ML SOPN   2. Would you like to learn more about the convenience, safety, & potential cost savings by using the [facility]?   3. Are you open to using the [facility] (Type [facility]. ).  4. Which pharmacy/location (including street and city if local pharmacy) is medication to be sent to?  [facility] - [place], [place] - [address]    5. Do they need a 30 day or 90 day supply?   90 day  Caller (Tea) stated patient wants medication sent to [facility] instead of [facility].

## 2023-12-06 ENCOUNTER — Other Ambulatory Visit (HOSPITAL_COMMUNITY): Payer: Self-pay

## 2023-12-11 ENCOUNTER — Other Ambulatory Visit: Payer: Self-pay | Admitting: Family Medicine

## 2023-12-11 ENCOUNTER — Ambulatory Visit: Admitting: Family Medicine

## 2023-12-11 VITALS — BP 121/80 | HR 98 | Temp 98.0°F | Ht 66.0 in | Wt 206.0 lb

## 2023-12-11 DIAGNOSIS — J0100 Acute maxillary sinusitis, unspecified: Secondary | ICD-10-CM | POA: Diagnosis not present

## 2023-12-11 MED ORDER — AMOXICILLIN-POT CLAVULANATE 875-125 MG PO TABS: 1.0000 | ORAL_TABLET | Freq: Two times a day (BID) | ORAL | 0 refills | Status: DC

## 2023-12-11 MED ORDER — CETIRIZINE HCL 10 MG PO TABS: 10.0000 mg | ORAL_TABLET | Freq: Every day | ORAL | 1 refills | Status: DC

## 2023-12-11 NOTE — Progress Notes (Signed)
 Subjective:  Patient ID: Anthony Hensley, male    DOB: November 29, 1960  Age: 63 y.o. MRN: [id number]  CC:   Chief Complaint  Patient presents with   Nasal Congestion    Runny nose, sneezing, fatigue, cough x 1 week - taking advil cold and sinus -wife has similar symptoms     HPI:  63 year old male presents with respiratory symptoms.  1 to 1-1/2-week history of sinus congestion, pressure, runny nose, cough.  Feels poorly.  No fever.  No relief with over-the-counter cough and sinus medication.  No known exacerbating factors.  Has had a recent sick contact.  Patient Active Problem List   Diagnosis Date Noted   Acute maxillary sinusitis 12/11/2023   Chronic pain of both knees 04/27/2023   Chronic pain of both shoulders 04/27/2023   Microalbuminuria 08/01/2022   Former smoker 07/14/2022   Emphysema lung (HCC) 07/14/2022   Benign prostatic hyperplasia with urinary frequency 07/14/2022   Type 2 diabetes mellitus with complications (HCC) 01/12/2022   Low back pain 12/23/2021   Essential hypertension    Hyperlipidemia associated with type 2 diabetes mellitus (HCC) 11/30/2020   CAD (coronary artery disease) 03/20/2017   GERD (gastroesophageal reflux disease) 06/08/2016    Social Hx   Social History   Socioeconomic History   Marital status: Married    Spouse name: Junious Dresser   Number of children: 1   Years of education: Not on file   Highest education level: 9th grade  Occupational History   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  Tobacco Use   Smoking status: Former    Current packs/day: 0.00    Average packs/day: 0.7 packs/day for 41.5 years (31.2 ttl pk-yrs)    Types: Cigarettes    Start date: 08/08/1976    Quit date: 02/21/2018    Years since quitting: 5.8   Smokeless tobacco: Never  Vaping Use   Vaping status: Never Used  Substance and Sexual Activity   Alcohol use: No    Alcohol/week: 0.0  standard drinks of alcohol   Drug use: No   Sexual activity: Not on file  Other Topics Concern   Not on file  Social History Narrative   Lives with wife   Social Drivers of Corporate investment banker Strain: Not on file  Food Insecurity: Not on file  Transportation Needs: Not on file  Physical Activity: Not on file  Stress: Not on file  Social Connections: Unknown (02/21/2022)   Received from [facility], [facility]   Social Network    Social Network: Not on file    Review of Systems Per HPI  Objective:  BP 121/80   Pulse 98   Temp 98 F (36.7 C)   Ht 5\' 6"  (1.676 m)   Wt 206 lb (93.4 kg)   SpO2 95%   BMI 33.25 kg/m      12/11/2023    2:52 PM 10/27/2023    9:16 AM 10/19/2023    8:30 AM  BP/Weight  Systolic BP 121 124   Diastolic BP 80 70   Wt. (Lbs) 206 209 209.6  BMI 33.25 kg/m2 33.73 kg/m2 33.83 kg/m2    Physical Exam Vitals and nursing note reviewed.  Constitutional:      General: He is not in acute distress.    Appearance: Normal appearance.  HENT:     Head: Normocephalic and atraumatic.  Nose: Congestion present.     Mouth/Throat:     Pharynx: Oropharynx is clear.  Cardiovascular:     Rate and Rhythm: Normal rate and regular rhythm.  Pulmonary:     Effort: Pulmonary effort is normal.     Breath sounds: Normal breath sounds. No wheezing, rhonchi or rales.  Neurological:     Mental Status: He is alert.     Lab Results  Component Value Date   WBC 5.7 10/27/2023   HGB 15.3 10/27/2023   HCT 46.6 10/27/2023   PLT 229 10/27/2023   GLUCOSE 142 (H) 10/27/2023   CHOL 94 (L) 10/27/2023   TRIG 172 (H) 10/27/2023   HDL 39 (L) 10/27/2023   LDLCALC 27 10/27/2023   ALT 32 10/27/2023   AST 22 10/27/2023   NA 138 10/27/2023   K 4.5 10/27/2023   CL 98 10/27/2023   CREATININE 0.90 10/27/2023   BUN 22 10/27/2023   CO2 24 10/27/2023   TSH 0.90 10/13/2020   INR 0.90 12/19/2017   HGBA1C 6.6 (H) 10/27/2023   MICROALBUR 1.3 11/30/2020      Assessment & Plan:  Acute maxillary sinusitis, recurrence not specified Assessment & Plan: Treating with Augmentin.  Zyrtec as prescribed.   Other orders -     Amoxicillin-Pot Clavulanate; Take 1 tablet by mouth 2 (two) times daily.  Dispense: 20 tablet; Refill: 0 -     Cetirizine HCl; Take 1 tablet (10 mg total) by mouth daily.  Dispense: 90 tablet; Refill: 1    Follow-up:  Return if symptoms worsen or fail to improve.  Everlene Other DO [facility]

## 2023-12-11 NOTE — Assessment & Plan Note (Signed)
 Treating with Augmentin.  Zyrtec as prescribed.

## 2023-12-26 ENCOUNTER — Telehealth: Payer: Self-pay | Admitting: *Deleted

## 2023-12-26 ENCOUNTER — Other Ambulatory Visit: Payer: Self-pay | Admitting: *Deleted

## 2023-12-26 DIAGNOSIS — Z87891 Personal history of nicotine dependence: Secondary | ICD-10-CM

## 2023-12-26 DIAGNOSIS — Z122 Encounter for screening for malignant neoplasm of respiratory organs: Secondary | ICD-10-CM

## 2023-12-26 NOTE — Telephone Encounter (Signed)
 Lung Cancer Screening Narrative/Criteria Questionnaire (Cigarette Smokers Only- No Cigars/Pipes/vapes)   Anthony Hensley   SDMV:01/18/24 9:30 - [place]                                           [birth year]              LDCT: 01/22/24 7:00- AP    62 y.o.   Phone: [phone]  Lung Screening Narrative (confirm age 21-77 yrs Medicare / 50-80 yrs Private pay insurance)   Insurance information:[employer] [facility]   Referring Provider:Cook   This screening involves an initial phone call with a team member from our program. It is called a shared decision making visit. The initial meeting is required by insurance and Medicare to make sure you understand the program. This appointment takes about 15-20 minutes to complete. The CT scan will completed at a separate date/time. This scan takes about 5-10 minutes to complete and you may eat and drink before and after the scan.  Criteria questions for Lung Cancer Screening:   Are you a current or former smoker? Former Age began smoking: 16   If you are a former smoker, what year did you quit smoking? 2020 (within 15 yrs)   To calculate your smoking history, I need an accurate estimate of how many packs of cigarettes you smoked per day and for how many years. (Not just the number of PPD you are now smoking)   Years smoking 41 x Packs per day 2 = Pack years 82   (at least 20 pack yrs)   (Make sure they understand that we need to know how much they have smoked in the past, not just the number of PPD they are smoking now)  Do you have a personal history of cancer?  No    Do you have a family history of cancer? Yes  (cancer type and and relative) Mother (?Chest)  Are you coughing up blood?  No  Have you had unexplained weight loss of 15 lbs or more in the last 6 months? No  It looks like you meet all criteria.     Additional information: N/A

## 2023-12-27 ENCOUNTER — Other Ambulatory Visit: Payer: Self-pay | Admitting: Internal Medicine

## 2024-01-01 ENCOUNTER — Other Ambulatory Visit: Payer: Self-pay

## 2024-01-08 ENCOUNTER — Telehealth: Payer: Self-pay | Admitting: Internal Medicine

## 2024-01-08 ENCOUNTER — Other Ambulatory Visit: Payer: Self-pay | Admitting: Family Medicine

## 2024-01-08 MED ORDER — SEMAGLUTIDE(0.25 OR 0.5MG/DOS) 2 MG/3ML ~~LOC~~ SOPN: 0.5000 mg | PEN_INJECTOR | SUBCUTANEOUS | 0 refills | Status: DC

## 2024-01-08 NOTE — Telephone Encounter (Signed)
 Spoke to patient he stated he needs a refill for Semaglutide.Advised I will send message to our PharmD.

## 2024-01-08 NOTE — Telephone Encounter (Signed)
 Patient stated he is returning staff call regarding getting his next shot.

## 2024-01-08 NOTE — Telephone Encounter (Signed)
*  STAT* If patient is at the pharmacy, call can be transferred to refill team.   1. Which medications need to be refilled? (please list name of each medication and dose if known)  Semaglutide,0.25 or 0.5MG /DOS, 2 MG/3ML SOPN   2. Would you like to learn more about the convenience, safety, & potential cost savings by using the [facility]?   3. Are you open to using the [facility] (Type [facility]. ).  4. Which pharmacy/location (including street and city if local pharmacy) is medication to be sent to?  [facility] - [place], [place] - [address]   5. Do they need a 30 day or 90 day supply?   Patient stated he still has some medication.

## 2024-01-08 NOTE — Telephone Encounter (Signed)
 I called pt and LVM to see if pt wants to stay on 0.5mg  of Ozempic or increase to 1mg 

## 2024-01-09 ENCOUNTER — Telehealth: Payer: Self-pay | Admitting: Pharmacy Technician

## 2024-01-09 ENCOUNTER — Other Ambulatory Visit (HOSPITAL_COMMUNITY): Payer: Self-pay

## 2024-01-09 NOTE — Telephone Encounter (Signed)
 Pharmacy Patient Advocate Encounter  Received notification from [facility] that Prior Authorization for Ozempic has been APPROVED from 01/09/24 to 10/09/24. Ran test claim, Copay is $47.00- one month. This test claim was processed through [facility]- copay amounts may vary at other pharmacies due to pharmacy/plan contracts, or as the patient moves through the different stages of their insurance plan.   PA #/Case ID/Reference #: [id number]

## 2024-01-09 NOTE — Telephone Encounter (Signed)
 Pharmacy Patient Advocate Encounter   Received notification from CoverMyMeds that prior authorization for Ozempic is required/requested.   Insurance verification completed.   The patient is insured through [employer] .   Per test claim: PA required; PA submitted to above mentioned insurance via CoverMyMeds Key/confirmation #/EOC [facility] Status is pending

## 2024-01-11 ENCOUNTER — Other Ambulatory Visit: Payer: Self-pay | Admitting: Family Medicine

## 2024-01-11 ENCOUNTER — Other Ambulatory Visit: Payer: Self-pay | Admitting: Internal Medicine

## 2024-01-17 ENCOUNTER — Telehealth: Payer: Self-pay | Admitting: *Deleted

## 2024-01-17 NOTE — Telephone Encounter (Unsigned)
 Copied from CRM [id number]. Topic: General - Other >> Jan 17, 2024  3:36 PM Eunice Blase wrote: Reason for CRM: Received call from [facility] per [place] ph: [phone] fax: [phone] need diagnosis. Verbal orders needed. reference# [id number]

## 2024-01-18 ENCOUNTER — Ambulatory Visit: Admitting: Adult Health

## 2024-01-18 ENCOUNTER — Encounter: Payer: Self-pay | Admitting: Adult Health

## 2024-01-18 DIAGNOSIS — Z87891 Personal history of nicotine dependence: Secondary | ICD-10-CM | POA: Diagnosis not present

## 2024-01-18 NOTE — Telephone Encounter (Signed)
 Tommie Sams, DO     Verbal orders for what?

## 2024-01-18 NOTE — Progress Notes (Signed)
  Virtual Visit via Telephone Note  I connected with Ruta Hinds , 01/18/24 9:38 AM by a telemedicine application and verified that I am speaking with the correct person using two identifiers.  Location: Patient: home Provider: home   I discussed the limitations of evaluation and management by telemedicine and the availability of in person appointments. The patient expressed understanding and agreed to proceed.   Shared Decision Making Visit Lung Cancer Screening Program [id number])   Eligibility: 63 y.o. Pack Years Smoking History Calculation = 82 pack years  (# packs/per year x # years smoked) Recent History of coughing up blood  no Unexplained weight loss? no ( >Than 15 pounds within the last 6 months ) Prior History Lung / other cancer no (Diagnosis within the last 5 years already requiring surveillance chest CT Scans). Smoking Status Former Smoker Former Smokers: Years since quit: 5 years  Quit Date: 2020  Visit Components: Discussion included one or more decision making aids. YES Discussion included risk/benefits of screening. YES Discussion included potential follow up diagnostic testing for abnormal scans. YES Discussion included meaning and risk of over diagnosis. YES Discussion included meaning and risk of False Positives. YES Discussion included meaning of total radiation exposure. YES  Counseling Included: Importance of adherence to annual lung cancer LDCT screening. YES Impact of comorbidities on ability to participate in the program. YES Ability and willingness to under diagnostic treatment. YES  Smoking Cessation Counseling: Former Smokers:  Discussed the importance of maintaining cigarette abstinence. yes Diagnosis Code: Personal History of Nicotine Dependence. U04.540 Information about tobacco cessation classes and interventions provided to patient. Yes Patient provided with "ticket" for LDCT Scan. yes Written Order for Lung Cancer Screening with LDCT  placed in Epic. Yes (CT Chest Lung Cancer Screening Low Dose W/O CM) [id number]  Z12.2-Screening of respiratory organs Z87.891-Personal history of nicotine dependence   Danford Bad 01/18/24

## 2024-01-18 NOTE — Patient Instructions (Signed)

## 2024-01-19 ENCOUNTER — Other Ambulatory Visit: Payer: Self-pay | Admitting: Family Medicine

## 2024-01-19 NOTE — Telephone Encounter (Signed)
 They will be sending a fax stating the information they are requesting

## 2024-01-22 ENCOUNTER — Ambulatory Visit (HOSPITAL_COMMUNITY)
Admission: RE | Admit: 2024-01-22 | Discharge: 2024-01-22 | Disposition: A | Discharge status: Home / Self Care | Source: Ambulatory Visit | Attending: Acute Care | Admitting: Acute Care

## 2024-01-22 DIAGNOSIS — Z122 Encounter for screening for malignant neoplasm of respiratory organs: Secondary | ICD-10-CM | POA: Diagnosis not present

## 2024-01-22 DIAGNOSIS — Z87891 Personal history of nicotine dependence: Secondary | ICD-10-CM | POA: Diagnosis not present

## 2024-01-30 ENCOUNTER — Encounter: Payer: Self-pay | Admitting: Family Medicine

## 2024-01-30 ENCOUNTER — Ambulatory Visit: Admitting: Family Medicine

## 2024-01-30 VITALS — BP 136/86 | HR 82 | Temp 97.7°F | Ht 66.0 in | Wt 206.0 lb

## 2024-01-30 DIAGNOSIS — J441 Chronic obstructive pulmonary disease with (acute) exacerbation: Secondary | ICD-10-CM | POA: Insufficient documentation

## 2024-01-30 MED ORDER — DOXYCYCLINE HYCLATE 100 MG PO TABS: 100.0000 mg | ORAL_TABLET | Freq: Two times a day (BID) | ORAL | 0 refills | Status: DC

## 2024-01-30 MED ORDER — PREDNISONE 50 MG PO TABS: 50.0000 mg | ORAL_TABLET | Freq: Every day | ORAL | 0 refills | Status: AC

## 2024-01-30 NOTE — Progress Notes (Signed)
 Subjective:  Patient ID: Anthony Hensley, male    DOB: [birth year]  Age: 63 y.o. MRN: [id number]  CC:   Chief Complaint  Patient presents with   Cough    X 8 days Hoarseness - cough is worse at night  Sinus congestion     HPI:  63 year old male with emphysema presents with respiratory symptoms.  1 week history of cough, congestion.  He has had some hoarseness as well.  No fever.  Feels poorly.  No relief with over-the-counter treatment.  He endorses compliance with his Advair.  He also states that he has been compliant with daily antihistamine.  Patient Active Problem List   Diagnosis Date Noted   COPD exacerbation (HCC) 01/30/2024   Chronic pain of both knees 04/27/2023   Chronic pain of both shoulders 04/27/2023   Microalbuminuria 08/01/2022   Former smoker 07/14/2022   Emphysema lung (HCC) 07/14/2022   Benign prostatic hyperplasia with urinary frequency 07/14/2022   Type 2 diabetes mellitus with complications (HCC) 01/12/2022   Low back pain 12/23/2021   Essential hypertension    Hyperlipidemia associated with type 2 diabetes mellitus (HCC) 11/30/2020   CAD (coronary artery disease) 03/20/2017   GERD (gastroesophageal reflux disease) 06/08/2016    Social Hx   Social History   Socioeconomic History   Marital status: Married    Spouse name: Debria Fang   Number of children: 1   Years of education: Not on file   Highest education level: 9th grade  Occupational History   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  Tobacco Use   Smoking status: Former    Current packs/day: 0.00    Average packs/day: 0.7 packs/day for 41.5 years (31.2 ttl pk-yrs)    Types: Cigarettes    Start date: 08/08/1976    Quit date: 02/21/2018    Years since quitting: 5.9   Smokeless tobacco: Never  Vaping Use   Vaping status: Never Used  Substance and Sexual Activity   Alcohol  use: No    Alcohol /week: 0.0 standard  drinks of alcohol    Drug use: No   Sexual activity: Not on file  Other Topics Concern   Not on file  Social History Narrative   Lives with wife   Social Drivers of Corporate investment banker Strain: Not on file  Food Insecurity: Not on file  Transportation Needs: Not on file  Physical Activity: Not on file  Stress: Not on file  Social Connections: Unknown (02/21/2022)   Received from [facility]   Social Network    Social Network: Not on file    Review of Systems Per HPI  Objective:  BP 136/86   Pulse 82   Temp 97.7 F (36.5 C)   Ht 5\' 6"  (1.676 m)   Wt 206 lb (93.4 kg)   SpO2 95%   BMI 33.25 kg/m      01/30/2024    3:54 PM 12/11/2023    2:52 PM 10/27/2023    9:16 AM  BP/Weight  Systolic BP 136 121 124  Diastolic BP 86 80 70  Wt. (Lbs) 206 206 209  BMI 33.25 kg/m2 33.25 kg/m2 33.73 kg/m2    Physical Exam Vitals and nursing note reviewed.  Constitutional:      General: He is not in acute distress.    Appearance: Normal appearance.  HENT:     Head: Normocephalic and atraumatic.  Nose: Congestion present.  Cardiovascular:     Rate and Rhythm: Normal rate and regular rhythm.  Pulmonary:     Effort: Pulmonary effort is normal.     Breath sounds: Normal breath sounds. No wheezing or rales.  Neurological:     Mental Status: He is alert.  Psychiatric:        Mood and Affect: Mood normal.        Behavior: Behavior normal.     Lab Results  Component Value Date   WBC 5.7 10/27/2023   HGB 15.3 10/27/2023   HCT 46.6 10/27/2023   PLT 229 10/27/2023   GLUCOSE 142 (H) 10/27/2023   CHOL 94 (L) 10/27/2023   TRIG 172 (H) 10/27/2023   HDL 39 (L) 10/27/2023   LDLCALC 27 10/27/2023   ALT 32 10/27/2023   AST 22 10/27/2023   NA 138 10/27/2023   K 4.5 10/27/2023   CL 98 10/27/2023   CREATININE 0.90 10/27/2023   BUN 22 10/27/2023   CO2 24 10/27/2023   TSH 0.90 10/13/2020   INR 0.90 12/19/2017   HGBA1C 6.6 (H) 10/27/2023   MICROALBUR 1.3  11/30/2020     Assessment & Plan:  COPD exacerbation (HCC) Assessment & Plan: Treating with Doxy and prednisone .   Other orders -     predniSONE ; Take 1 tablet (50 mg total) by mouth daily for 5 days.  Dispense: 5 tablet; Refill: 0 -     Doxycycline  Hyclate; Take 1 tablet (100 mg total) by mouth 2 (two) times daily. Take with food and water . Avoid being out in the sun.  Dispense: 14 tablet; Refill: 0    Follow-up:  Return if symptoms worsen or fail to improve.  Kathleen Papa DO [facility]

## 2024-01-30 NOTE — Assessment & Plan Note (Signed)
 Treating with Doxy and prednisone .

## 2024-01-30 NOTE — Patient Instructions (Signed)
 Medications as prescribed.  CT has not been read yet.

## 2024-02-07 ENCOUNTER — Other Ambulatory Visit: Payer: Self-pay | Admitting: Family Medicine

## 2024-02-07 ENCOUNTER — Other Ambulatory Visit: Payer: Self-pay | Admitting: Internal Medicine

## 2024-02-08 ENCOUNTER — Telehealth: Payer: Self-pay | Admitting: Internal Medicine

## 2024-02-08 ENCOUNTER — Other Ambulatory Visit: Payer: Self-pay

## 2024-02-08 MED ORDER — METFORMIN HCL 500 MG PO TABS: 1000.0000 mg | ORAL_TABLET | Freq: Two times a day (BID) | ORAL | 2 refills | Status: DC

## 2024-02-08 NOTE — Telephone Encounter (Signed)
*  STAT* If patient is at the pharmacy, call can be transferred to refill team.   1. Which medications need to be refilled? (please list name of each medication and dose if known) Semaglutide ,0.25 or 0.5MG /DOS, 2 MG/3ML SOPN    2. Would you like to learn more about the convenience, safety, & potential cost savings by using the [facility]?    3. Are you open to using the [facility] (Type [facility].  ).   4. Which pharmacy/location (including street and city if local pharmacy) is medication to be sent to? [facility] - [place], [place] - [address]    5. Do they need a 30 day or 90 day supply? 30 day

## 2024-02-09 NOTE — Telephone Encounter (Signed)
 Trying to figure out what dose he should be on. LVM for him to call back

## 2024-02-13 NOTE — Telephone Encounter (Signed)
 Pharmacy calling for update. Please advise

## 2024-02-14 NOTE — Telephone Encounter (Signed)
 [facility] Advised that I have been trying to call pt to see if he wanted to increase dose. Has not returned call. Will send in 0.5mg . if they could leave note for pt

## 2024-02-19 ENCOUNTER — Telehealth: Payer: Self-pay | Admitting: Acute Care

## 2024-02-19 NOTE — Telephone Encounter (Signed)
 Received call report on pt's LDCT. Impression below.    IMPRESSION: 1. Lung-RADS 3, probably benign findings. Short-term follow-up in 6 months is recommended with repeat low-dose chest CT without contrast (please use the following order, "CT CHEST LCS NODULE FOLLOW-UP W/O CM"). 2. Coronary artery calcifications. 3. Aortic Atherosclerosis (ICD10-I70.0) and Emphysema (ICD10-J43.9).     Electronically Signed   By: Kimberley Penman M.D.   On: 02/19/2024 15:08

## 2024-02-20 ENCOUNTER — Other Ambulatory Visit: Payer: Self-pay

## 2024-02-20 DIAGNOSIS — R911 Solitary pulmonary nodule: Secondary | ICD-10-CM

## 2024-02-20 DIAGNOSIS — Z122 Encounter for screening for malignant neoplasm of respiratory organs: Secondary | ICD-10-CM

## 2024-02-20 DIAGNOSIS — F17210 Nicotine dependence, cigarettes, uncomplicated: Secondary | ICD-10-CM

## 2024-02-20 DIAGNOSIS — Z87891 Personal history of nicotine dependence: Secondary | ICD-10-CM

## 2024-02-20 NOTE — Telephone Encounter (Signed)
 Spoke with patient. Reviewed recent CT results. He is in agreement to 6 month f/u scan to evaluate new 6.9 mm nodule. Results and plan to PCP. 6 month order placed and scheduled for 07/23/2024 at [facility] 8:00 am.

## 2024-02-20 NOTE — Telephone Encounter (Signed)
 Called patient to review recent CT results. New 6.9 mm nodule since scan in 2019. LVM to call office and review results.

## 2024-02-23 ENCOUNTER — Other Ambulatory Visit: Payer: Self-pay | Admitting: Family Medicine

## 2024-02-27 ENCOUNTER — Other Ambulatory Visit: Payer: Self-pay | Admitting: Family Medicine

## 2024-03-21 ENCOUNTER — Other Ambulatory Visit: Payer: Self-pay | Admitting: Internal Medicine

## 2024-04-04 ENCOUNTER — Other Ambulatory Visit: Payer: Self-pay | Admitting: Family Medicine

## 2024-04-25 ENCOUNTER — Ambulatory Visit: Payer: Medicare HMO | Admitting: Nurse Practitioner

## 2024-04-26 ENCOUNTER — Ambulatory Visit: Payer: Medicare HMO | Admitting: Family Medicine

## 2024-04-29 ENCOUNTER — Encounter: Payer: Self-pay | Admitting: Family Medicine

## 2024-05-01 ENCOUNTER — Other Ambulatory Visit: Payer: Self-pay | Admitting: Family Medicine

## 2024-05-16 ENCOUNTER — Ambulatory Visit: Admitting: Family Medicine

## 2024-05-16 ENCOUNTER — Encounter: Payer: Self-pay | Admitting: Family Medicine

## 2024-05-16 ENCOUNTER — Other Ambulatory Visit: Payer: Self-pay | Admitting: Family Medicine

## 2024-05-16 VITALS — BP 146/85 | HR 69 | Temp 98.1°F | Ht 66.0 in | Wt 216.0 lb

## 2024-05-16 DIAGNOSIS — E1169 Type 2 diabetes mellitus with other specified complication: Secondary | ICD-10-CM | POA: Diagnosis not present

## 2024-05-16 DIAGNOSIS — I10 Essential (primary) hypertension: Secondary | ICD-10-CM | POA: Diagnosis not present

## 2024-05-16 DIAGNOSIS — E118 Type 2 diabetes mellitus with unspecified complications: Secondary | ICD-10-CM | POA: Diagnosis not present

## 2024-05-16 DIAGNOSIS — E785 Hyperlipidemia, unspecified: Secondary | ICD-10-CM | POA: Diagnosis not present

## 2024-05-16 DIAGNOSIS — M7918 Myalgia, other site: Secondary | ICD-10-CM

## 2024-05-16 DIAGNOSIS — Z7984 Long term (current) use of oral hypoglycemic drugs: Secondary | ICD-10-CM | POA: Diagnosis not present

## 2024-05-16 MED ORDER — ONETOUCH VERIO VI STRP: ORAL_STRIP | 3 refills | Status: DC

## 2024-05-16 MED ORDER — ONETOUCH DELICA PLUS LANCET30G MISC: 3 refills | Status: AC

## 2024-05-16 MED ORDER — FLUTICASONE-SALMETEROL 100-50 MCG/ACT IN AEPB: 1.0000 | INHALATION_SPRAY | Freq: Two times a day (BID) | RESPIRATORY_TRACT | 2 refills | Status: AC

## 2024-05-16 MED ORDER — TAMSULOSIN HCL 0.4 MG PO CAPS: 0.4000 mg | ORAL_CAPSULE | Freq: Every day | ORAL | 3 refills | Status: DC

## 2024-05-16 MED ORDER — PANTOPRAZOLE SODIUM 40 MG PO TBEC: 40.0000 mg | DELAYED_RELEASE_TABLET | Freq: Every day | ORAL | 3 refills | Status: DC

## 2024-05-16 MED ORDER — DICLOFENAC SODIUM 75 MG PO TBEC: 75.0000 mg | DELAYED_RELEASE_TABLET | Freq: Two times a day (BID) | ORAL | 3 refills | Status: DC | PRN

## 2024-05-16 MED ORDER — CETIRIZINE HCL 10 MG PO TABS: 10.0000 mg | ORAL_TABLET | Freq: Every day | ORAL | 3 refills | Status: DC

## 2024-05-16 MED ORDER — LISINOPRIL 20 MG PO TABS: 20.0000 mg | ORAL_TABLET | Freq: Every day | ORAL | 3 refills | Status: DC

## 2024-05-16 NOTE — Patient Instructions (Signed)
 Labs in 7-10 days.  I increased the Lisinopril .  Follow up in 3 months given blood pressure elevation.  Take care  Dr. Bluford

## 2024-05-16 NOTE — Assessment & Plan Note (Signed)
 Not at goal.  Increasing lisinopril .  Advise labs in 7 to 10 days.

## 2024-05-16 NOTE — Assessment & Plan Note (Signed)
 Has been stable.  A1c ordered.  Continue metformin  and Ozempic .

## 2024-05-16 NOTE — Progress Notes (Signed)
 Subjective:  Patient ID: Anthony Hensley, male    DOB: Jun 07, 1961  Age: 63 y.o. MRN: [id number]  CC:   Chief Complaint  Patient presents with   Follow-up    6 month f/u hypertension DM2, hyperlipidemia     HPI:  63 year old male presents for follow-up.  Patient's blood pressure is elevated here today.  He endorses compliance with lisinopril  and metoprolol .  Patient reports that he is having pain in his upper back and neck as well as the ankles.  Occurs predominantly at night.  He uses ibuprofen  with improvement.  This interferes with sleep.  LDL at goal on Crestor  and Zetia .  A1c has been at goal on metformin  and Ozempic .  Needs A1c.  Patient Active Problem List   Diagnosis Date Noted   Musculoskeletal pain 05/16/2024   Chronic pain of both knees 04/27/2023   Chronic pain of both shoulders 04/27/2023   Microalbuminuria 08/01/2022   Former smoker 07/14/2022   Emphysema lung (HCC) 07/14/2022   Benign prostatic hyperplasia with urinary frequency 07/14/2022   Type 2 diabetes mellitus with complications (HCC) 01/12/2022   Low back pain 12/23/2021   Essential hypertension    Hyperlipidemia associated with type 2 diabetes mellitus (HCC) 11/30/2020   CAD (coronary artery disease) 03/20/2017   GERD (gastroesophageal reflux disease) 06/08/2016    Social Hx   Social History   Socioeconomic History   Marital status: Married    Spouse name: Jori   Number of children: 1   Years of education: Not on file   Highest education level: 9th grade  Occupational History   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  Tobacco Use   Smoking status: Former    Current packs/day: 0.00    Average packs/day: 0.7 packs/day for 41.5 years (31.2 ttl pk-yrs)    Types: Cigarettes    Start date: 08/08/1976    Quit date: 02/21/2018    Years since quitting: 6.2   Smokeless tobacco: Never  Vaping Use   Vaping status:  Never Used  Substance and Sexual Activity   Alcohol  use: No    Alcohol /week: 0.0 standard drinks of alcohol    Drug use: No   Sexual activity: Not on file  Other Topics Concern   Not on file  Social History Narrative   Lives with wife   Social Drivers of Corporate investment banker Strain: Not on file  Food Insecurity: Not on file  Transportation Needs: Not on file  Physical Activity: Not on file  Stress: Not on file  Social Connections: Unknown (02/21/2022)   Received from [employer]   Social Network    Social Network: Not on file    Review of Systems Per HPI  Objective:  BP (!) 146/85   Pulse 69   Temp 98.1 F (36.7 C)   Ht 5' 6 (1.676 m)   Wt 216 lb (98 kg)   SpO2 96%   BMI 34.86 kg/m      05/16/2024   10:02 AM 05/16/2024    9:28 AM 01/30/2024    3:54 PM  BP/Weight  Systolic BP 146 154 136  Diastolic BP 85 88 86  Wt. (Lbs)  216 206  BMI  34.86 kg/m2 33.25 kg/m2    Physical Exam Constitutional:      General: He is not in acute distress.    Appearance: Normal appearance.  HENT:  Head: Normocephalic and atraumatic.  Eyes:     General:        Right eye: No discharge.        Left eye: No discharge.     Conjunctiva/sclera: Conjunctivae normal.  Neck:      Comments: Tenderness at the labeled location. Cardiovascular:     Rate and Rhythm: Normal rate and regular rhythm.  Pulmonary:     Effort: Pulmonary effort is normal.     Breath sounds: Normal breath sounds. No wheezing, rhonchi or rales.  Abdominal:     Palpations: Abdomen is soft.     Tenderness: There is no abdominal tenderness.  Neurological:     Mental Status: He is alert.  Psychiatric:        Mood and Affect: Mood normal.        Behavior: Behavior normal.     Lab Results  Component Value Date   WBC 5.7 10/27/2023   HGB 15.3 10/27/2023   HCT 46.6 10/27/2023   PLT 229 10/27/2023   GLUCOSE 142 (H) 10/27/2023   CHOL 94 (L) 10/27/2023   TRIG 172 (H) 10/27/2023   HDL 39 (L)  10/27/2023   LDLCALC 27 10/27/2023   ALT 32 10/27/2023   AST 22 10/27/2023   NA 138 10/27/2023   K 4.5 10/27/2023   CL 98 10/27/2023   CREATININE 0.90 10/27/2023   BUN 22 10/27/2023   CO2 24 10/27/2023   TSH 0.90 10/13/2020   INR 0.90 12/19/2017   HGBA1C 6.6 (H) 10/27/2023   MICROALBUR 1.3 11/30/2020     Assessment & Plan:  Type 2 diabetes mellitus with complications [facility]) Assessment & Plan: Has been stable.  A1c ordered.  Continue metformin  and Ozempic .  Orders: -     Basic metabolic panel with GFR -     Hemoglobin A1c  Essential hypertension Assessment & Plan: Not at goal.  Increasing lisinopril .  Advise labs in 7 to 10 days.  Orders: -     Lisinopril ; Take 1 tablet (20 mg total) by mouth daily.  Dispense: 90 tablet; Refill: 3  Hyperlipidemia associated with type 2 diabetes mellitus (HCC) Assessment & Plan: LDL at goal.  Continue current medications.   Musculoskeletal pain Assessment & Plan: Diclofenac  as prescribed.    Other orders -     Cetirizine  HCl; Take 1 tablet (10 mg total) by mouth daily.  Dispense: 90 tablet; Refill: 3 -     Fluticasone -Salmeterol; Inhale 1 puff into the lungs 2 (two) times daily. To prevent wheezing and cough  Dispense: 60 each; Refill: 2 -     OneTouch Verio; Use as instructed  Dispense: 100 strip; Refill: 3 -     OneTouch Delica Plus Lancet30G; Use to check blood sugar once daily.  Dispense: 100 each; Refill: 3 -     Pantoprazole  Sodium; Take 1 tablet (40 mg total) by mouth daily.  Dispense: 90 tablet; Refill: 3 -     Tamsulosin  HCl; Take 1 capsule (0.4 mg total) by mouth daily.  Dispense: 90 capsule; Refill: 3 -     Diclofenac  Sodium; Take 1 tablet (75 mg total) by mouth 2 (two) times daily as needed for moderate pain (pain score 4-6).  Dispense: 60 tablet; Refill: 3    Follow-up:  3 months  Mahsa Hanser Bluford DO [facility]

## 2024-05-16 NOTE — Assessment & Plan Note (Signed)
Diclofenac as prescribed.

## 2024-05-16 NOTE — Assessment & Plan Note (Signed)
LDL at goal. Continue current medications. 

## 2024-05-24 DIAGNOSIS — E118 Type 2 diabetes mellitus with unspecified complications: Secondary | ICD-10-CM | POA: Diagnosis not present

## 2024-05-25 LAB — HEMOGLOBIN A1C
Est. average glucose Bld gHb Est-mCnc: 126 mg/dL
Hgb A1c MFr Bld: 6 % — ABNORMAL HIGH (ref 4.8–5.6)

## 2024-05-25 LAB — BASIC METABOLIC PANEL WITH GFR
BUN/Creatinine Ratio: 20 (ref 10–24)
BUN: 17 mg/dL (ref 8–27)
CO2: 24 mmol/L (ref 20–29)
Calcium: 9.3 mg/dL (ref 8.6–10.2)
Chloride: 100 mmol/L (ref 96–106)
Creatinine, Ser: 0.83 mg/dL (ref 0.76–1.27)
Glucose: 129 mg/dL — ABNORMAL HIGH (ref 70–99)
Potassium: 4.6 mmol/L (ref 3.5–5.2)
Sodium: 137 mmol/L (ref 134–144)
eGFR: 100 mL/min/1.73 (ref >59)

## 2024-05-26 ENCOUNTER — Ambulatory Visit: Payer: Self-pay | Admitting: Family Medicine

## 2024-06-04 ENCOUNTER — Other Ambulatory Visit: Payer: Self-pay | Admitting: Family Medicine

## 2024-06-17 ENCOUNTER — Other Ambulatory Visit: Payer: Self-pay | Admitting: Internal Medicine

## 2024-06-25 ENCOUNTER — Other Ambulatory Visit: Payer: Self-pay | Admitting: Family Medicine

## 2024-07-01 ENCOUNTER — Telehealth: Payer: Self-pay

## 2024-07-01 DIAGNOSIS — E1169 Type 2 diabetes mellitus with other specified complication: Secondary | ICD-10-CM

## 2024-07-01 DIAGNOSIS — J441 Chronic obstructive pulmonary disease with (acute) exacerbation: Secondary | ICD-10-CM

## 2024-07-01 DIAGNOSIS — E118 Type 2 diabetes mellitus with unspecified complications: Secondary | ICD-10-CM

## 2024-07-01 MED ORDER — COVID-19 MRNA VAC-TRIS(PFIZER) 30 MCG/0.3ML IM SUSY: 0.3000 mL | PREFILLED_SYRINGE | Freq: Once | INTRAMUSCULAR | 0 refills | Status: AC

## 2024-07-01 NOTE — Telephone Encounter (Signed)
 Copied from CRM [id number]. Topic: General - Other >> Jun 28, 2024  4:58 PM Wess RAMAN wrote: Reason for CRM: Patient would like to receive the covid 19 vaccine at his pharmacy and needs a prescription from PCP.  Callback #: [id number]  Pharmacy: [facility] - [place], [place] - [address] [place] [address] Phone: [phone] Fax: [phone] Hours: Not open 24 hours

## 2024-07-23 ENCOUNTER — Ambulatory Visit (HOSPITAL_COMMUNITY)
Admission: RE | Admit: 2024-07-23 | Discharge: 2024-07-23 | Disposition: A | Discharge status: Home / Self Care | Source: Ambulatory Visit | Attending: Family Medicine | Admitting: Family Medicine

## 2024-07-23 DIAGNOSIS — J9811 Atelectasis: Secondary | ICD-10-CM | POA: Diagnosis not present

## 2024-07-23 DIAGNOSIS — Z122 Encounter for screening for malignant neoplasm of respiratory organs: Secondary | ICD-10-CM | POA: Diagnosis not present

## 2024-07-23 DIAGNOSIS — R918 Other nonspecific abnormal finding of lung field: Secondary | ICD-10-CM | POA: Diagnosis not present

## 2024-07-23 DIAGNOSIS — F17210 Nicotine dependence, cigarettes, uncomplicated: Secondary | ICD-10-CM | POA: Diagnosis not present

## 2024-07-23 DIAGNOSIS — J439 Emphysema, unspecified: Secondary | ICD-10-CM | POA: Diagnosis not present

## 2024-07-23 DIAGNOSIS — Z87891 Personal history of nicotine dependence: Secondary | ICD-10-CM | POA: Insufficient documentation

## 2024-07-23 DIAGNOSIS — F17200 Nicotine dependence, unspecified, uncomplicated: Secondary | ICD-10-CM | POA: Diagnosis not present

## 2024-07-23 DIAGNOSIS — R911 Solitary pulmonary nodule: Secondary | ICD-10-CM | POA: Insufficient documentation

## 2024-07-29 ENCOUNTER — Telehealth: Payer: Self-pay

## 2024-07-29 NOTE — Telephone Encounter (Signed)
 Results reviewed by Ruthell, NP. Please call patient and review recent Lung CT results. He will need a 6 month follow up scan to evaluate the new 9.8 mm nodule. Place order. Results and plan to PCP.   IMPRESSION: Cluster of new bronchocentric micro nodules in left upper lobe likely infectious/inflammatory. Right lower lobe nodule is stable to prior, however less solid. Findings likely represent waxing and waning infectious/inflammatory process.Lung-RADS 2, benign appearance or behavior. Continue annual screening with low-dose chest CT without contrast in 12 months.   Aortic Atherosclerosis (ICD10-I70.0) and Emphysema (ICD10-J43.9).

## 2024-07-29 NOTE — Telephone Encounter (Signed)
 Called and left VM for pt

## 2024-07-30 ENCOUNTER — Other Ambulatory Visit: Payer: Self-pay

## 2024-07-30 DIAGNOSIS — R911 Solitary pulmonary nodule: Secondary | ICD-10-CM

## 2024-07-30 DIAGNOSIS — F17210 Nicotine dependence, cigarettes, uncomplicated: Secondary | ICD-10-CM

## 2024-07-30 DIAGNOSIS — Z122 Encounter for screening for malignant neoplasm of respiratory organs: Secondary | ICD-10-CM

## 2024-07-30 DIAGNOSIS — Z87891 Personal history of nicotine dependence: Secondary | ICD-10-CM

## 2024-07-30 NOTE — Telephone Encounter (Signed)
 Spoke with patient and reviewed recent Lung CT results. He will complete a 6 month follow up scan on 01/22/2025. Order placed. Results an plan to PCP.

## 2024-07-30 NOTE — Telephone Encounter (Signed)
LVM and mailed letter

## 2024-07-31 ENCOUNTER — Other Ambulatory Visit: Payer: Self-pay | Admitting: Family Medicine

## 2024-07-31 NOTE — Telephone Encounter (Signed)
Pt called again to check on refill status

## 2024-08-03 ENCOUNTER — Other Ambulatory Visit: Payer: Self-pay | Admitting: Family Medicine

## 2024-08-05 ENCOUNTER — Other Ambulatory Visit: Payer: Self-pay | Admitting: Internal Medicine

## 2024-08-16 ENCOUNTER — Ambulatory Visit: Admitting: Family Medicine

## 2024-08-21 ENCOUNTER — Encounter (INDEPENDENT_AMBULATORY_CARE_PROVIDER_SITE_OTHER): Payer: Self-pay | Admitting: *Deleted

## 2024-08-21 ENCOUNTER — Encounter: Payer: Self-pay | Admitting: Family Medicine

## 2024-08-21 ENCOUNTER — Ambulatory Visit: Admitting: Family Medicine

## 2024-08-21 VITALS — BP 138/82 | HR 73 | Ht 66.0 in | Wt 217.0 lb

## 2024-08-21 DIAGNOSIS — E785 Hyperlipidemia, unspecified: Secondary | ICD-10-CM

## 2024-08-21 DIAGNOSIS — E118 Type 2 diabetes mellitus with unspecified complications: Secondary | ICD-10-CM | POA: Diagnosis not present

## 2024-08-21 DIAGNOSIS — Z7984 Long term (current) use of oral hypoglycemic drugs: Secondary | ICD-10-CM

## 2024-08-21 DIAGNOSIS — H8110 Benign paroxysmal vertigo, unspecified ear: Secondary | ICD-10-CM | POA: Diagnosis not present

## 2024-08-21 DIAGNOSIS — M7918 Myalgia, other site: Secondary | ICD-10-CM

## 2024-08-21 DIAGNOSIS — I10 Essential (primary) hypertension: Secondary | ICD-10-CM | POA: Diagnosis not present

## 2024-08-21 DIAGNOSIS — Z1211 Encounter for screening for malignant neoplasm of colon: Secondary | ICD-10-CM

## 2024-08-21 DIAGNOSIS — E1169 Type 2 diabetes mellitus with other specified complication: Secondary | ICD-10-CM

## 2024-08-21 MED ORDER — MECLIZINE HCL 25 MG PO TABS: 25.0000 mg | ORAL_TABLET | Freq: Three times a day (TID) | ORAL | 0 refills | Status: DC | PRN

## 2024-08-21 MED ORDER — DICLOFENAC SODIUM 75 MG PO TBEC: 75.0000 mg | DELAYED_RELEASE_TABLET | Freq: Two times a day (BID) | ORAL | 1 refills | Status: DC | PRN

## 2024-08-21 NOTE — Assessment & Plan Note (Signed)
 Lipids at goal.  Continue current occasions.

## 2024-08-21 NOTE — Assessment & Plan Note (Signed)
 Meclizine  as needed.  Referring to vestibular rehab.

## 2024-08-21 NOTE — Patient Instructions (Addendum)
 Referral to GI placed.  Referral to Vestibular rehab placed.  Continue your medications.  Follow up in 6 months.  Cardiology # - [phone]

## 2024-08-21 NOTE — Assessment & Plan Note (Signed)
 Rx sent for diclofenac .  Use sparingly.

## 2024-08-21 NOTE — Assessment & Plan Note (Signed)
A1c at goal.  Continue metformin and semaglutide.

## 2024-08-21 NOTE — Assessment & Plan Note (Signed)
 Stable.  Continue lisinopril and metoprolol.

## 2024-08-21 NOTE — Progress Notes (Signed)
 Subjective:  Patient ID: Anthony Hensley, male    DOB: 1960-11-05  Age: 63 y.o. MRN: [id number]  CC:   Chief Complaint  Patient presents with   Diabetes    Three month follow up   Dizziness    Dizzy spells off and on     HPI:  63 year old male presents for follow-up.  Patient reports that over the past 2 weeks she has had some intermittent dizziness.  He states that he feels like the room is spinning.  Typically occurs with abrupt movements.  This is consistent with BPPV.  BP is stable on lisinopril  and metoprolol .  Patient requesting refill on NSAID.  He is using this for musculoskeletal pain particularly of the neck.  Need to be careful with NSAID use in this setting CAD.  Follows with cardiology.  No current chest pain.  Patient inquiring about his last colonoscopy.  His last colonoscopy was in 2017 and it was recommended that he have a repeat in 5 years.  He is therefore overdue.  He would like to get this done.  A1c and LDL at goal.  Has follow-up CT lung cancer screening in April.  Patient Active Problem List   Diagnosis Date Noted   Benign paroxysmal positional vertigo 08/21/2024   Musculoskeletal pain 05/16/2024   Chronic pain of both knees 04/27/2023   Chronic pain of both shoulders 04/27/2023   Microalbuminuria 08/01/2022   Former smoker 07/14/2022   Emphysema lung (HCC) 07/14/2022   Benign prostatic hyperplasia with urinary frequency 07/14/2022   Type 2 diabetes mellitus with complications (HCC) 01/12/2022   Low back pain 12/23/2021   Essential hypertension    Hyperlipidemia associated with type 2 diabetes mellitus (HCC) 11/30/2020   CAD (coronary artery disease) 03/20/2017   GERD (gastroesophageal reflux disease) 06/08/2016    Social Hx   Social History   Socioeconomic History   Marital status: Married    Spouse name: Jori   Number of children: 1   Years of education: Not on file   Highest education level: 9th grade  Occupational History    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  Tobacco Use   Smoking status: Former    Current packs/day: 0.00    Average packs/day: 0.7 packs/day for 41.5 years (31.2 ttl pk-yrs)    Types: Cigarettes    Start date: 08/08/1976    Quit date: 02/21/2018    Years since quitting: 6.5   Smokeless tobacco: Never  Vaping Use   Vaping status: Never Used  Substance and Sexual Activity   Alcohol  use: No    Alcohol /week: 0.0 standard drinks of alcohol    Drug use: No   Sexual activity: Not on file  Other Topics Concern   Not on file  Social History Narrative   Lives with wife   Social Drivers of Corporate Investment Banker Strain: Not on file  Food Insecurity: Not on file  Transportation Needs: Not on file  Physical Activity: Not on file  Stress: Not on file  Social Connections: Unknown (02/21/2022)   Received from [employer]   Social Network    Social Network: Not on file    Review of Systems Per HPI  Objective:  BP 138/82   Pulse 73   Ht 5' 6 (1.676 m)   Wt 217 lb (98.4 kg)   SpO2 98%   BMI 35.02 kg/m  08/21/2024    8:52 AM 05/16/2024   10:02 AM 05/16/2024    9:28 AM  BP/Weight  Systolic BP 138 146 154  Diastolic BP 82 85 88  Wt. (Lbs) 217  216  BMI 35.02 kg/m2  34.86 kg/m2    Physical Exam Vitals and nursing note reviewed.  Constitutional:      General: He is not in acute distress.    Appearance: Normal appearance.  HENT:     Head: Normocephalic and atraumatic.  Eyes:     General:        Right eye: No discharge.        Left eye: No discharge.     Conjunctiva/sclera: Conjunctivae normal.  Cardiovascular:     Rate and Rhythm: Normal rate and regular rhythm.  Pulmonary:     Effort: Pulmonary effort is normal.     Breath sounds: Normal breath sounds. No wheezing, rhonchi or rales.  Neurological:     Mental Status: He is alert.  Psychiatric:        Mood and Affect: Mood normal.         Behavior: Behavior normal.     Lab Results  Component Value Date   WBC 5.7 10/27/2023   HGB 15.3 10/27/2023   HCT 46.6 10/27/2023   PLT 229 10/27/2023   GLUCOSE 129 (H) 05/24/2024   CHOL 94 (L) 10/27/2023   TRIG 172 (H) 10/27/2023   HDL 39 (L) 10/27/2023   LDLCALC 27 10/27/2023   ALT 32 10/27/2023   AST 22 10/27/2023   NA 137 05/24/2024   K 4.6 05/24/2024   CL 100 05/24/2024   CREATININE 0.83 05/24/2024   BUN 17 05/24/2024   CO2 24 05/24/2024   TSH 0.90 10/13/2020   INR 0.90 12/19/2017   HGBA1C 6.0 (H) 05/24/2024   MICROALBUR 1.3 11/30/2020     Assessment & Plan:  Benign paroxysmal positional vertigo, unspecified laterality Assessment & Plan: Meclizine  as needed.  Referring to vestibular rehab.  Orders: -     Ambulatory referral to Physical Therapy -     Meclizine  HCl; Take 1 tablet (25 mg total) by mouth 3 (three) times daily as needed for dizziness.  Dispense: 30 tablet; Refill: 0  Encounter for screening colonoscopy -     Ambulatory referral to Gastroenterology  Essential hypertension Assessment & Plan: Stable.  Continue lisinopril  and metoprolol .   Type 2 diabetes mellitus with complications (HCC) Assessment & Plan: A1c at goal.  Continue metformin  and semaglutide .   Musculoskeletal pain Assessment & Plan: Rx sent for diclofenac .  Use sparingly.  Orders: -     Diclofenac  Sodium; Take 1 tablet (75 mg total) by mouth 2 (two) times daily as needed for moderate pain (pain score 4-6).  Dispense: 60 tablet; Refill: 1  Hyperlipidemia associated with type 2 diabetes mellitus (HCC) Assessment & Plan: Lipids at goal.  Continue current occasions.     Follow-up: 6 months  Shawne Eskelson Bluford DO [facility]

## 2024-09-02 ENCOUNTER — Other Ambulatory Visit: Payer: Self-pay | Admitting: Internal Medicine

## 2024-09-02 DIAGNOSIS — E118 Type 2 diabetes mellitus with unspecified complications: Secondary | ICD-10-CM

## 2024-09-06 ENCOUNTER — Other Ambulatory Visit: Payer: Self-pay | Admitting: Family Medicine

## 2024-09-09 ENCOUNTER — Telehealth: Payer: Self-pay | Admitting: Family Medicine

## 2024-09-09 MED ORDER — EZETIMIBE 10 MG PO TABS: 10.0000 mg | ORAL_TABLET | Freq: Every day | ORAL | 0 refills | Status: AC

## 2024-09-09 MED ORDER — ROSUVASTATIN CALCIUM 40 MG PO TABS: ORAL_TABLET | ORAL | 0 refills | Status: AC

## 2024-09-09 MED ORDER — METOPROLOL SUCCINATE ER 25 MG PO TB24: 25.0000 mg | ORAL_TABLET | Freq: Every day | ORAL | 0 refills | Status: AC

## 2024-09-09 NOTE — Telephone Encounter (Unsigned)
 Copied from CRM #[id number]. Topic: Clinical - Medication Refill >> Sep 09, 2024  9:22 AM Jasmin G wrote: Medication: ibuprofen   Has the patient contacted their pharmacy? No (Agent: If no, request that the patient contact the pharmacy for the refill. If patient does not wish to contact the pharmacy document the reason why and proceed with request.) (Agent: If yes, when and what did the pharmacy advise?)  This is the patient's preferred pharmacy:  [facility] - [place], [place] - [address] [place] [address] Phone: [phone] Fax: [phone]  Is this the correct pharmacy for this prescription? Yes If no, delete pharmacy and type the correct one.   Has the prescription been filled recently? No  Is the patient out of the medication? Yes  Has the patient been seen for an appointment in the last year OR does the patient have an upcoming appointment? Yes  Can we respond through MyChart? No  Agent: Please be advised that Rx refills may take up to 3 business days. We ask that you follow-up with your pharmacy.

## 2024-09-10 ENCOUNTER — Ambulatory Visit: Payer: Self-pay | Admitting: *Deleted

## 2024-09-10 MED ORDER — OZEMPIC (0.25 OR 0.5 MG/DOSE) 2 MG/3ML ~~LOC~~ SOPN: 0.5000 mg | PEN_INJECTOR | SUBCUTANEOUS | 3 refills | Status: AC

## 2024-09-10 NOTE — Telephone Encounter (Signed)
 Pt requesting script for Ibuprofen , unable to pend

## 2024-09-10 NOTE — Telephone Encounter (Signed)
 See other message with patient response

## 2024-09-10 NOTE — Telephone Encounter (Signed)
 Please advise if another medication can be prescribed. Pt reports he tried diclofenac  75 mg with no relief from shoulder pain. Has been taking ibuprofen  with some relief. Appt scheduled for tomorrow. Please advise if another medication can be tried and if patient needs to keep OV for tomorrow.         FYI Only or Action Required?: FYI only for provider: appointment scheduled on 09/11/24.  Patient was last seen in primary care on 08/21/2024 by Cook, Jayce G, DO.  Called Nurse Triage reporting Pain.  Symptoms began several years ago.  Interventions attempted: OTC medications: ibuprofen , Prescription medications: diclofenac , and Rest, hydration, or home remedies.  Symptoms are: gradually worsening.  Triage Disposition: See HCP Within 4 Hours (Or PCP Triage) 4-24 hours  Patient/caregiver understands and will follow disposition?: Yes              Copied from CRM #[id number]. Topic: Clinical - Red Word Triage >> Sep 10, 2024 10:13 AM Carlyon D wrote: Red Word that prompted transfer to Nurse Triage: Shoulders up to back of the neck and ankles are very painful. Pt wants medication Reason for Disposition  Weakness (i.e., loss of strength) in hand or fingers  (Exception: Not truly weak; hand feels weak because of pain.)  Answer Assessment - Initial Assessment Questions Appt scheduled tomorrow with PCP none available today. Reviewed message from PCP patient should be taking diclofenac  75 mg for pain. Patient reports he did take medication and was not effective with pain control and started taking ibuprofen  with some control.     1. ONSET: When did the pain start?     Couple of years ago  2. LOCATION: Where is the pain located?     Bilateral shoulders to back and neck and bilateral ankles 3. PAIN: How bad is the pain? (Scale 1-10; or mild, moderate, severe)     Moderate to severe ,feels like sticking knife in shoulders with certain movements 4. WORK OR EXERCISE: Has there  been any recent work or exercise that involved this part of the body?     no 5. CAUSE: What do you think is causing the shoulder pain?     Not sure 6. OTHER SYMPTOMS: Do you have any other symptoms? (e.g., neck pain, swelling, rash, fever, numbness, weakness)      Bilateral shoulder pain radiates to back and neck, feels like knife sticking in shoulders, weakness picking up coffee pot. Hurts worse after driving. Diclofneac 75 mg has been taken and not effective. Started taking ibuprofen  with some relief.  7. PREGNANCY: Is there any chance you are pregnant? When was your last menstrual period?     na  Protocols used: Shoulder Pain-A-AH

## 2024-09-11 ENCOUNTER — Encounter: Payer: Self-pay | Admitting: Family Medicine

## 2024-09-11 ENCOUNTER — Ambulatory Visit: Admitting: Family Medicine

## 2024-09-11 DIAGNOSIS — G8929 Other chronic pain: Secondary | ICD-10-CM | POA: Insufficient documentation

## 2024-09-11 MED ORDER — IBUPROFEN 800 MG PO TABS: 800.0000 mg | ORAL_TABLET | Freq: Three times a day (TID) | ORAL | 3 refills | Status: DC | PRN

## 2024-09-11 NOTE — Progress Notes (Signed)
 Subjective:  Patient ID: Anthony Hensley, male    DOB: November 17, 1960  Age: 63 y.o. MRN: [id number]  CC:   Chief Complaint  Patient presents with   Neck Pain    Pain in neck down to the shoulders, hard time turning his head and lifting arms    Ankle Pain    Bilateral ankle pain     HPI:  63 year old male presents for evaluation of the above.  Has chronic MSK pain. Reports ongoing neck pain and decreased ROM. He is also having bilateral ankle pain (around the medial malleoli). Quite troublesome. States diclofenac  is not that effective. Would like Rx for Ibuprofen  instead. No recent fall, trauma or injury.  Patient Active Problem List   Diagnosis Date Noted   Chronic neck pain 09/11/2024   Chronic pain of both ankles 09/11/2024   Benign paroxysmal positional vertigo 08/21/2024   Musculoskeletal pain 05/16/2024   Chronic pain of both knees 04/27/2023   Chronic pain of both shoulders 04/27/2023   Microalbuminuria 08/01/2022   Former smoker 07/14/2022   Emphysema lung (HCC) 07/14/2022   Benign prostatic hyperplasia with urinary frequency 07/14/2022   Type 2 diabetes mellitus with complications (HCC) 01/12/2022   Low back pain 12/23/2021   Essential hypertension    Hyperlipidemia associated with type 2 diabetes mellitus (HCC) 11/30/2020   CAD (coronary artery disease) 03/20/2017   GERD (gastroesophageal reflux disease) 06/08/2016    Social Hx   Social History   Socioeconomic History   Marital status: Married    Spouse name: Jori   Number of children: 1   Years of education: Not on file   Highest education level: 9th grade  Occupational History   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  Tobacco Use   Smoking status: Former    Current packs/day: 0.00    Average packs/day: 0.7 packs/day for 41.5 years (31.2 ttl pk-yrs)    Types: Cigarettes    Start date: 08/08/1976    Quit date: 02/21/2018     Years since quitting: 6.5   Smokeless tobacco: Never  Vaping Use   Vaping status: Never Used  Substance and Sexual Activity   Alcohol  use: No    Alcohol /week: 0.0 standard drinks of alcohol    Drug use: No   Sexual activity: Not on file  Other Topics Concern   Not on file  Social History Narrative   Lives with wife   Social Drivers of Health   Financial Resource Strain: Not on file  Food Insecurity: Not on file  Transportation Needs: Not on file  Physical Activity: Not on file  Stress: Not on file  Social Connections: Unknown (02/21/2022)   Received from [employer]   Social Network    Social Network: Not on file    Review of Systems Per HPI  Objective:  BP 123/82   Pulse 87   Ht 5' 6 (1.676 m)   Wt 217 lb (98.4 kg)   SpO2 98%   BMI 35.02 kg/m      09/11/2024    3:41 PM 08/21/2024    8:52 AM 05/16/2024   10:02 AM  BP/Weight  Systolic BP 123 138 146  Diastolic BP 82 82 85  Wt. (Lbs) 217 217   BMI 35.02 kg/m2 35.02 kg/m2     Physical Exam Vitals and nursing note reviewed.  Constitutional:      General: He  is not in acute distress.    Appearance: Normal appearance.  HENT:     Head: Normocephalic and atraumatic.  Neck:     Comments: Decreased ROM of the neck (in left rotation). Pulmonary:     Effort: Pulmonary effort is normal. No respiratory distress.  Musculoskeletal:     Comments: No appreciable swelling, warmth, erythema of the ankle.  Neurological:     Mental Status: He is alert.     Lab Results  Component Value Date   WBC 5.7 10/27/2023   HGB 15.3 10/27/2023   HCT 46.6 10/27/2023   PLT 229 10/27/2023   GLUCOSE 129 (H) 05/24/2024   CHOL 94 (L) 10/27/2023   TRIG 172 (H) 10/27/2023   HDL 39 (L) 10/27/2023   LDLCALC 27 10/27/2023   ALT 32 10/27/2023   AST 22 10/27/2023   NA 137 05/24/2024   K 4.6 05/24/2024   CL 100 05/24/2024   CREATININE 0.83 05/24/2024   BUN 17 05/24/2024   CO2 24 05/24/2024   TSH 0.90 10/13/2020   INR 0.90  12/19/2017   HGBA1C 6.0 (H) 05/24/2024   MICROALBUR 1.3 11/30/2020     Assessment & Plan:  Chronic neck pain Assessment & Plan: Xray today for evaluation. Ibuprofen  as directed.  Orders: -     DG Cervical Spine Complete -     Ibuprofen ; Take 1 tablet (800 mg total) by mouth every 8 (eight) hours as needed for moderate pain (pain score 4-6).  Dispense: 90 tablet; Refill: 3  Chronic pain of both ankles Assessment & Plan: Xrays today for evaluation. Ibuprofen  as needed.  Orders: -     DG Ankle Complete Right -     DG Ankle Complete Left -     Ibuprofen ; Take 1 tablet (800 mg total) by mouth every 8 (eight) hours as needed for moderate pain (pain score 4-6).  Dispense: 90 tablet; Refill: 3    Follow-up:  Return if symptoms worsen or fail to improve.  Jacqulyn Ahle DO [facility]

## 2024-09-11 NOTE — Patient Instructions (Signed)
 Xrays today.  Medication sent in.

## 2024-09-11 NOTE — Assessment & Plan Note (Signed)
 Xrays today for evaluation. Ibuprofen  as needed.

## 2024-09-11 NOTE — Assessment & Plan Note (Signed)
 Xray today for evaluation. Ibuprofen  as directed.

## 2024-09-16 ENCOUNTER — Telehealth: Payer: Self-pay | Admitting: Family Medicine

## 2024-09-16 DIAGNOSIS — H8110 Benign paroxysmal vertigo, unspecified ear: Secondary | ICD-10-CM

## 2024-09-16 DIAGNOSIS — I10 Essential (primary) hypertension: Secondary | ICD-10-CM

## 2024-09-16 NOTE — Telephone Encounter (Unsigned)
 Copied from CRM #[id number]. Topic: Clinical - Medication Refill >> Sep 16, 2024  5:30 PM Delon T wrote: Medication: meclizine  (ANTIVERT ) 25 MG tablet cetirizine  (ZYRTEC ) 10 MG tablet tamsulosin  (FLOMAX ) 0.4 MG CAPS capsule pantoprazole  (PROTONIX ) 40 MG tablet  lisinopril  (ZESTRIL ) 20 MG tablet metFORMIN  (GLUCOPHAGE ) 500 MG tablet   Has the patient contacted their pharmacy? Yes (Agent: If no, request that the patient contact the pharmacy for the refill. If patient does not wish to contact the pharmacy document the reason why and proceed with request.) (Agent: If yes, when and what did the pharmacy advise?)  This is the patient's preferred pharmacy:  [facility] - [place], [place] - [address] [place], [place] [address] Phone: [phone]  Fax: [phone]    Is this the correct pharmacy for this prescription? Yes If no, delete pharmacy and type the correct one.   Has the prescription been filled recently? Yes  Is the patient out of the medication? Yes  Has the patient been seen for an appointment in the last year OR does the patient have an upcoming appointment? Yes  Can we respond through MyChart? Yes  Agent: Please be advised that Rx refills may take up to 3 business days. We ask that you follow-up with your pharmacy.

## 2024-09-18 MED ORDER — LISINOPRIL 20 MG PO TABS: 20.0000 mg | ORAL_TABLET | Freq: Every day | ORAL | 3 refills | Status: AC

## 2024-09-18 MED ORDER — MECLIZINE HCL 25 MG PO TABS: 25.0000 mg | ORAL_TABLET | Freq: Three times a day (TID) | ORAL | 0 refills | Status: AC | PRN

## 2024-09-18 MED ORDER — TAMSULOSIN HCL 0.4 MG PO CAPS: 0.4000 mg | ORAL_CAPSULE | Freq: Every day | ORAL | 3 refills | Status: AC

## 2024-09-18 MED ORDER — METFORMIN HCL 500 MG PO TABS: 1000.0000 mg | ORAL_TABLET | Freq: Two times a day (BID) | ORAL | 2 refills | Status: AC

## 2024-09-18 MED ORDER — PANTOPRAZOLE SODIUM 40 MG PO TBEC: 40.0000 mg | DELAYED_RELEASE_TABLET | Freq: Every day | ORAL | 3 refills | Status: AC

## 2024-09-18 MED ORDER — CETIRIZINE HCL 10 MG PO TABS: 10.0000 mg | ORAL_TABLET | Freq: Every day | ORAL | 3 refills | Status: AC

## 2024-09-30 ENCOUNTER — Ambulatory Visit (HOSPITAL_COMMUNITY)
Admission: RE | Admit: 2024-09-30 | Discharge: 2024-09-30 | Disposition: A | Discharge status: Home / Self Care | Source: Ambulatory Visit | Attending: Family Medicine | Admitting: Family Medicine

## 2024-09-30 DIAGNOSIS — G8929 Other chronic pain: Secondary | ICD-10-CM | POA: Insufficient documentation

## 2024-09-30 DIAGNOSIS — M25571 Pain in right ankle and joints of right foot: Secondary | ICD-10-CM | POA: Insufficient documentation

## 2024-09-30 DIAGNOSIS — M25572 Pain in left ankle and joints of left foot: Secondary | ICD-10-CM | POA: Insufficient documentation

## 2024-09-30 DIAGNOSIS — M542 Cervicalgia: Secondary | ICD-10-CM | POA: Diagnosis present

## 2024-10-04 ENCOUNTER — Other Ambulatory Visit: Payer: Self-pay | Admitting: Family Medicine

## 2024-10-04 DIAGNOSIS — G8929 Other chronic pain: Secondary | ICD-10-CM

## 2024-10-04 NOTE — Telephone Encounter (Signed)
 Copied from CRM #[id number]. Topic: Clinical - Medication Refill >> Oct 04, 2024  3:56 PM Sophia H wrote: Medication: ibuprofen  (ADVIL ) 800 MG tablet  Has the patient contacted their pharmacy? Yes, updated RX to pharmacy   This is the patient's preferred pharmacy:    [facility] - [place], [place] - [address] [place] [address] Phone: [phone] Fax: [phone]  Is this the correct pharmacy for this prescription? Yes If no, delete pharmacy and type the correct one.   Has the prescription been filled recently? Yes  Is the patient out of the medication? Yes  Has the patient been seen for an appointment in the last year OR does the patient have an upcoming appointment? Yes, seen in December  Can we respond through MyChart? Yes  Agent: Please be advised that Rx refills may take up to 3 business days. We ask that you follow-up with your pharmacy.

## 2024-10-07 MED ORDER — IBUPROFEN 800 MG PO TABS: 800.0000 mg | ORAL_TABLET | Freq: Three times a day (TID) | ORAL | 3 refills | Status: AC | PRN

## 2024-10-08 ENCOUNTER — Telehealth: Payer: Self-pay | Admitting: Pharmacy Technician

## 2024-10-08 NOTE — Telephone Encounter (Signed)
" ° ° °  Pharmacy Patient Advocate Encounter  Received notification from [employer] that Prior Authorization for ozempic  2mg  has been APPROVED from 10/08/24 to 10/09/25    "

## 2024-10-08 NOTE — Telephone Encounter (Signed)
 Pharmacy Patient Advocate Encounter   Received notification from [employer] that prior authorization for OZEMPIC  is required/requested.   Insurance verification completed.   The patient is insured through [facility].   Per RFF:[id number]:    Won't let me do renewal yet

## 2024-10-16 ENCOUNTER — Ambulatory Visit: Payer: Self-pay | Admitting: Family Medicine

## 2025-01-22 ENCOUNTER — Ambulatory Visit (HOSPITAL_COMMUNITY)
